# Patient Record
Sex: Male | Born: 1950 | Race: White | Hispanic: No | Smoking: Former smoker
Health system: Southern US, Community
[De-identification: ages and names within clinical notes are randomized; demographics above are authoritative.]

## PROBLEM LIST (undated history)

## (undated) DIAGNOSIS — I480 Paroxysmal atrial fibrillation: Secondary | ICD-10-CM

## (undated) DIAGNOSIS — R739 Hyperglycemia, unspecified: Secondary | ICD-10-CM

## (undated) DIAGNOSIS — I350 Nonrheumatic aortic (valve) stenosis: Secondary | ICD-10-CM

## (undated) DIAGNOSIS — I2510 Atherosclerotic heart disease of native coronary artery without angina pectoris: Secondary | ICD-10-CM

## (undated) DIAGNOSIS — D696 Thrombocytopenia, unspecified: Secondary | ICD-10-CM

## (undated) DIAGNOSIS — I10 Essential (primary) hypertension: Secondary | ICD-10-CM

## (undated) DIAGNOSIS — F1020 Alcohol dependence, uncomplicated: Secondary | ICD-10-CM

## (undated) DIAGNOSIS — I499 Cardiac arrhythmia, unspecified: Secondary | ICD-10-CM

## (undated) DIAGNOSIS — B9620 Unspecified Escherichia coli [E. coli] as the cause of diseases classified elsewhere: Secondary | ICD-10-CM

## (undated) DIAGNOSIS — E119 Type 2 diabetes mellitus without complications: Secondary | ICD-10-CM

## (undated) DIAGNOSIS — D62 Acute posthemorrhagic anemia: Secondary | ICD-10-CM

## (undated) DIAGNOSIS — K7010 Alcoholic hepatitis without ascites: Secondary | ICD-10-CM

## (undated) DIAGNOSIS — E039 Hypothyroidism, unspecified: Secondary | ICD-10-CM

## (undated) DIAGNOSIS — K921 Melena: Secondary | ICD-10-CM

## (undated) DIAGNOSIS — K219 Gastro-esophageal reflux disease without esophagitis: Secondary | ICD-10-CM

## (undated) DIAGNOSIS — M109 Gout, unspecified: Secondary | ICD-10-CM

## (undated) DIAGNOSIS — F1011 Alcohol abuse, in remission: Secondary | ICD-10-CM

## (undated) DIAGNOSIS — N390 Urinary tract infection, site not specified: Secondary | ICD-10-CM

## (undated) DIAGNOSIS — E876 Hypokalemia: Secondary | ICD-10-CM

## (undated) DIAGNOSIS — N184 Chronic kidney disease, stage 4 (severe): Secondary | ICD-10-CM

---
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
## 2016-08-22 ENCOUNTER — Encounter (HOSPITAL_COMMUNITY): Payer: Self-pay | Admitting: Emergency Medicine

## 2016-08-22 ENCOUNTER — Inpatient Hospital Stay (HOSPITAL_COMMUNITY)
Admission: EM | Admit: 2016-08-22 | Discharge: 2016-08-28 | DRG: 378 | Disposition: A | Discharge status: Home / Self Care | Payer: Medicare Other | Attending: Internal Medicine | Admitting: Internal Medicine

## 2016-08-22 DIAGNOSIS — K8020 Calculus of gallbladder without cholecystitis without obstruction: Secondary | ICD-10-CM | POA: Diagnosis present

## 2016-08-22 DIAGNOSIS — Z66 Do not resuscitate: Secondary | ICD-10-CM | POA: Diagnosis present

## 2016-08-22 DIAGNOSIS — D62 Acute posthemorrhagic anemia: Secondary | ICD-10-CM | POA: Diagnosis present

## 2016-08-22 DIAGNOSIS — Z9119 Patient's noncompliance with other medical treatment and regimen: Secondary | ICD-10-CM

## 2016-08-22 DIAGNOSIS — K5730 Diverticulosis of large intestine without perforation or abscess without bleeding: Secondary | ICD-10-CM | POA: Diagnosis present

## 2016-08-22 DIAGNOSIS — K7011 Alcoholic hepatitis with ascites: Secondary | ICD-10-CM | POA: Diagnosis present

## 2016-08-22 DIAGNOSIS — K7031 Alcoholic cirrhosis of liver with ascites: Secondary | ICD-10-CM

## 2016-08-22 DIAGNOSIS — E876 Hypokalemia: Secondary | ICD-10-CM | POA: Diagnosis present

## 2016-08-22 DIAGNOSIS — R627 Adult failure to thrive: Secondary | ICD-10-CM | POA: Diagnosis present

## 2016-08-22 DIAGNOSIS — Z9114 Patient's other noncompliance with medication regimen: Secondary | ICD-10-CM | POA: Diagnosis not present

## 2016-08-22 DIAGNOSIS — I10 Essential (primary) hypertension: Secondary | ICD-10-CM | POA: Diagnosis present

## 2016-08-22 DIAGNOSIS — D696 Thrombocytopenia, unspecified: Secondary | ICD-10-CM | POA: Diagnosis present

## 2016-08-22 DIAGNOSIS — D539 Nutritional anemia, unspecified: Secondary | ICD-10-CM | POA: Diagnosis present

## 2016-08-22 DIAGNOSIS — K219 Gastro-esophageal reflux disease without esophagitis: Secondary | ICD-10-CM | POA: Diagnosis present

## 2016-08-22 DIAGNOSIS — R7989 Other specified abnormal findings of blood chemistry: Secondary | ICD-10-CM | POA: Diagnosis not present

## 2016-08-22 DIAGNOSIS — K709 Alcoholic liver disease, unspecified: Secondary | ICD-10-CM | POA: Diagnosis present

## 2016-08-22 DIAGNOSIS — F1020 Alcohol dependence, uncomplicated: Secondary | ICD-10-CM | POA: Diagnosis present

## 2016-08-22 DIAGNOSIS — E039 Hypothyroidism, unspecified: Secondary | ICD-10-CM | POA: Diagnosis present

## 2016-08-22 DIAGNOSIS — Z79899 Other long term (current) drug therapy: Secondary | ICD-10-CM | POA: Diagnosis not present

## 2016-08-22 DIAGNOSIS — K640 First degree hemorrhoids: Secondary | ICD-10-CM | POA: Diagnosis present

## 2016-08-22 DIAGNOSIS — K921 Melena: Secondary | ICD-10-CM | POA: Diagnosis present

## 2016-08-22 DIAGNOSIS — R739 Hyperglycemia, unspecified: Secondary | ICD-10-CM | POA: Diagnosis present

## 2016-08-22 DIAGNOSIS — IMO0001 Reserved for inherently not codable concepts without codable children: Secondary | ICD-10-CM

## 2016-08-22 DIAGNOSIS — K625 Hemorrhage of anus and rectum: Secondary | ICD-10-CM | POA: Diagnosis present

## 2016-08-22 DIAGNOSIS — D649 Anemia, unspecified: Secondary | ICD-10-CM

## 2016-08-22 DIAGNOSIS — E038 Other specified hypothyroidism: Secondary | ICD-10-CM

## 2016-08-22 DIAGNOSIS — K7010 Alcoholic hepatitis without ascites: Secondary | ICD-10-CM

## 2016-08-22 HISTORY — DX: Essential (primary) hypertension: I10

## 2016-08-22 LAB — COMPREHENSIVE METABOLIC PANEL
ALK PHOS: 109 U/L (ref 38–126)
ALT: 42 U/L (ref 17–63)
AST: 179 U/L — ABNORMAL HIGH (ref 15–41)
Albumin: 3.8 g/dL (ref 3.5–5.0)
Anion gap: 16 — ABNORMAL HIGH (ref 5–15)
BILIRUBIN TOTAL: 2.2 mg/dL — AB (ref 0.3–1.2)
BUN: 16 mg/dL (ref 6–20)
CALCIUM: 9 mg/dL (ref 8.9–10.3)
CO2: 29 mmol/L (ref 22–32)
CREATININE: 1.14 mg/dL (ref 0.61–1.24)
Chloride: 90 mmol/L — ABNORMAL LOW (ref 101–111)
Glucose, Bld: 142 mg/dL — ABNORMAL HIGH (ref 65–99)
Potassium: 2.7 mmol/L — CL (ref 3.5–5.1)
Sodium: 135 mmol/L (ref 135–145)
TOTAL PROTEIN: 7.7 g/dL (ref 6.5–8.1)

## 2016-08-22 LAB — FERRITIN: FERRITIN: 553 ng/mL — AB (ref 24–336)

## 2016-08-22 LAB — PROTIME-INR
INR: 1.18
PROTHROMBIN TIME: 15 s (ref 11.4–15.2)

## 2016-08-22 LAB — GLUCOSE, CAPILLARY
GLUCOSE-CAPILLARY: 127 mg/dL — AB (ref 65–99)
Glucose-Capillary: 133 mg/dL — ABNORMAL HIGH (ref 65–99)

## 2016-08-22 LAB — URINALYSIS, ROUTINE W REFLEX MICROSCOPIC
GLUCOSE, UA: NEGATIVE mg/dL
KETONES UR: 15 mg/dL — AB
LEUKOCYTES UA: NEGATIVE
Nitrite: POSITIVE — AB
PH: 6 (ref 5.0–8.0)
Protein, ur: 100 mg/dL — AB
Specific Gravity, Urine: 1.02 (ref 1.005–1.030)

## 2016-08-22 LAB — FOLATE: Folate: 3.5 ng/mL — ABNORMAL LOW (ref >5.9)

## 2016-08-22 LAB — CBC
HCT: 24.5 % — ABNORMAL LOW (ref 39.0–52.0)
Hemoglobin: 8.8 g/dL — ABNORMAL LOW (ref 13.0–17.0)
MCH: 39.6 pg — AB (ref 26.0–34.0)
MCHC: 35.9 g/dL (ref 30.0–36.0)
MCV: 110.4 fL — ABNORMAL HIGH (ref 78.0–100.0)
PLATELETS: 63 10*3/uL — AB (ref 150–400)
RBC: 2.22 MIL/uL — AB (ref 4.22–5.81)
RDW: 15.3 % (ref 11.5–15.5)
WBC: 5.1 10*3/uL (ref 4.0–10.5)

## 2016-08-22 LAB — IRON AND TIBC
IRON: 189 ug/dL — AB (ref 45–182)
Saturation Ratios: 75 % — ABNORMAL HIGH (ref 17.9–39.5)
TIBC: 253 ug/dL (ref 250–450)
UIBC: 64 ug/dL

## 2016-08-22 LAB — TYPE AND SCREEN
ABO/RH(D): O POS
Antibody Screen: NEGATIVE

## 2016-08-22 LAB — VITAMIN B12: Vitamin B-12: 196 pg/mL (ref 180–914)

## 2016-08-22 LAB — URINE MICROSCOPIC-ADD ON: BACTERIA UA: NONE SEEN

## 2016-08-22 LAB — POC OCCULT BLOOD, ED: FECAL OCCULT BLD: POSITIVE — AB

## 2016-08-22 LAB — ABO/RH: ABO/RH(D): O POS

## 2016-08-22 MED ORDER — LEVOTHYROXINE SODIUM 100 MCG PO TABS
200.0000 ug | ORAL_TABLET | Freq: Every day | ORAL | Status: DC
  Administered 2016-08-23 – 2016-08-25 (×3): 200 ug via ORAL
  Filled 2016-08-22 (×3): qty 2

## 2016-08-22 MED ORDER — SODIUM CHLORIDE 0.9 % IV SOLN
510.0000 mg | Freq: Once | INTRAVENOUS | Status: AC
  Administered 2016-08-22: 510 mg via INTRAVENOUS
  Filled 2016-08-22 (×2): qty 17

## 2016-08-22 MED ORDER — POTASSIUM CHLORIDE CRYS ER 20 MEQ PO TBCR
40.0000 meq | EXTENDED_RELEASE_TABLET | Freq: Two times a day (BID) | ORAL | Status: DC
  Administered 2016-08-22: 40 meq via ORAL
  Filled 2016-08-22: qty 2

## 2016-08-22 MED ORDER — PANTOPRAZOLE SODIUM 40 MG PO TBEC
40.0000 mg | DELAYED_RELEASE_TABLET | Freq: Two times a day (BID) | ORAL | Status: DC
  Administered 2016-08-22 – 2016-08-28 (×12): 40 mg via ORAL
  Filled 2016-08-22 (×12): qty 1

## 2016-08-22 MED ORDER — LORAZEPAM 2 MG/ML IJ SOLN
0.0000 mg | Freq: Two times a day (BID) | INTRAMUSCULAR | Status: AC
Start: 2016-08-24 — End: 2016-08-26
  Filled 2016-08-22: qty 1

## 2016-08-22 MED ORDER — LORAZEPAM 1 MG PO TABS
1.0000 mg | ORAL_TABLET | Freq: Four times a day (QID) | ORAL | Status: AC | PRN
  Administered 2016-08-24: 1 mg via ORAL
  Filled 2016-08-22: qty 1

## 2016-08-22 MED ORDER — THIAMINE HCL 100 MG/ML IJ SOLN: 100.0000 mg | Freq: Every day | INTRAMUSCULAR | Status: DC

## 2016-08-22 MED ORDER — LORAZEPAM 2 MG/ML IJ SOLN
1.0000 mg | Freq: Four times a day (QID) | INTRAMUSCULAR | Status: AC | PRN
  Administered 2016-08-24: 1 mg via INTRAVENOUS

## 2016-08-22 MED ORDER — ADULT MULTIVITAMIN W/MINERALS CH
1.0000 | ORAL_TABLET | Freq: Every day | ORAL | Status: DC
  Administered 2016-08-22 – 2016-08-28 (×6): 1 via ORAL
  Filled 2016-08-22 (×6): qty 1

## 2016-08-22 MED ORDER — ONDANSETRON HCL 4 MG/2ML IJ SOLN: 4.0000 mg | Freq: Four times a day (QID) | INTRAMUSCULAR | Status: DC | PRN

## 2016-08-22 MED ORDER — LORAZEPAM 2 MG/ML IJ SOLN
0.0000 mg | Freq: Four times a day (QID) | INTRAMUSCULAR | Status: AC
Start: 2016-08-22 — End: 2016-08-24
  Administered 2016-08-24: 2 mg via INTRAVENOUS
  Filled 2016-08-22: qty 1

## 2016-08-22 MED ORDER — SODIUM CHLORIDE 0.9 % IV SOLN
30.0000 meq | Freq: Once | INTRAVENOUS | Status: AC
  Administered 2016-08-22: 30 meq via INTRAVENOUS
  Filled 2016-08-22: qty 15

## 2016-08-22 MED ORDER — VITAMIN B-1 100 MG PO TABS
100.0000 mg | ORAL_TABLET | Freq: Every day | ORAL | Status: DC
  Administered 2016-08-22 – 2016-08-28 (×7): 100 mg via ORAL
  Filled 2016-08-22 (×7): qty 1

## 2016-08-22 MED ORDER — AMLODIPINE BESYLATE 10 MG PO TABS
10.0000 mg | ORAL_TABLET | Freq: Every day | ORAL | Status: DC
  Administered 2016-08-22: 10 mg via ORAL
  Filled 2016-08-22: qty 1

## 2016-08-22 MED ORDER — ACETAMINOPHEN 650 MG RE SUPP: 650.0000 mg | Freq: Four times a day (QID) | RECTAL | Status: DC | PRN

## 2016-08-22 MED ORDER — FOLIC ACID 1 MG PO TABS
1.0000 mg | ORAL_TABLET | Freq: Every day | ORAL | Status: DC
  Administered 2016-08-22 – 2016-08-28 (×7): 1 mg via ORAL
  Filled 2016-08-22 (×7): qty 1

## 2016-08-22 MED ORDER — ONDANSETRON HCL 4 MG PO TABS: 4.0000 mg | ORAL_TABLET | Freq: Four times a day (QID) | ORAL | Status: DC | PRN

## 2016-08-22 MED ORDER — ACETAMINOPHEN 325 MG PO TABS: 650.0000 mg | ORAL_TABLET | Freq: Four times a day (QID) | ORAL | Status: DC | PRN

## 2016-08-22 NOTE — ED Provider Notes (Signed)
[facility]-EMERGENCY DEPT Provider Note   CSN: [id number] Arrival date & time: 08/22/16  1116     History   Chief Complaint Chief Complaint  Patient presents with  . Rectal Bleeding  . Abnormal Lab    HPI Mark Meadows is a 65 y.o. male.  65 year old Caucasian male past medical history significant for hypertension, thyroid disease a presents to the ED today after a referral from his primary care doctor for GI bleed and low hemoglobin. Patient states "my doctor said she did blood work and told her to come here I could die". Patient endorses dark blood in his stool that started over the past 1-2 months. He also endorses generalized weakness for the past 1-2 months. Patient states his last colonoscopy was 30 years ago. He denies any prolonged NSAID use. Denies any history of diverticulitis, PUD, H pylori infection. Patient states that he drinks approximately 2 1.5 L of liquor per week. He denies any fever, chills, headache vision changes, lightheadedness, dizziness, cough, chest pain, shortness of breath, abdominal pain, nausea, emesis, urinary symptoms, numbness/tingling. Patient states his last bowel movement was yesterday with dark blood.      Past Medical History:  Diagnosis Date  . Hypertension   . Thyroid disease     There are no active problems to display for this patient.   History reviewed. No pertinent surgical history.     Home Medications    Prior to Admission medications   Not on File    Family History No family history on file.  Social History Social History  Substance Use Topics  . Smoking status: Never Smoker  . Smokeless tobacco: Not on file  . Alcohol use Yes     Allergies   Patient has no known allergies.   Review of Systems Review of Systems  Constitutional: Negative for chills and fever.  HENT: Negative for congestion, ear pain, rhinorrhea and sore throat.   Eyes: Negative for pain and discharge.  Respiratory: Negative for cough and  shortness of breath.   Cardiovascular: Negative for chest pain and palpitations.  Gastrointestinal: Negative for abdominal pain, diarrhea, nausea and vomiting.  Genitourinary: Negative for flank pain, frequency, hematuria and urgency.  Musculoskeletal: Negative for myalgias and neck pain.  Neurological: Positive for weakness. Negative for dizziness, syncope, light-headedness, numbness and headaches.  All other systems reviewed and are negative.    Physical Exam Updated Vital Signs BP 131/95   Pulse 73   Temp 98.1 F (36.7 C) (Oral)   Resp 18   SpO2 100%   Physical Exam  Constitutional: He is oriented to person, place, and time. He appears well-developed and well-nourished. No distress.  Appears older than stated age.  HENT:  Head: Normocephalic and atraumatic.  Mouth/Throat: Uvula is midline, oropharynx is clear and moist and mucous membranes are normal.  Eyes: Conjunctivae and EOM are normal. Pupils are equal, round, and reactive to light. Right eye exhibits no discharge. Left eye exhibits no discharge. No scleral icterus.  Neck: Normal range of motion. Neck supple. No thyromegaly present.  Cardiovascular: Normal rate, regular rhythm, normal heart sounds and intact distal pulses.  Exam reveals no gallop and no friction rub.   No murmur heard. Pulmonary/Chest: Effort normal and breath sounds normal. No respiratory distress.  CTAB   Abdominal: Soft. Bowel sounds are normal. He exhibits no distension. There is no tenderness. There is no rebound and no guarding.  Genitourinary:  Genitourinary Comments: Chaperon present for exam with soft brown stool in rectal vault  with small amount of red blood. External and internal hemorrhoids noted. No tenderness to palpation. Rectal tone is normal.  Musculoskeletal: Normal range of motion.  1+ pitting edema up to the level of the shins.  Lymphadenopathy:    He has no cervical adenopathy.  Neurological: He is alert and oriented to person,  place, and time.  Skin: Skin is warm and dry. Capillary refill takes less than 2 seconds. No pallor.  Nursing note and vitals reviewed.    ED Treatments / Results  Labs (all labs ordered are listed, but only abnormal results are displayed) Labs Reviewed  COMPREHENSIVE METABOLIC PANEL - Abnormal; Notable for the following:       Result Value   Potassium 2.7 (*)    Chloride 90 (*)    Glucose, Bld 142 (*)    AST 179 (*)    Total Bilirubin 2.2 (*)    Anion gap 16 (*)    All other components within normal limits  CBC - Abnormal; Notable for the following:    RBC 2.22 (*)    Hemoglobin 8.8 (*)    HCT 24.5 (*)    MCV 110.4 (*)    MCH 39.6 (*)    Platelets 63 (*)    All other components within normal limits  POC OCCULT BLOOD, ED - Abnormal; Notable for the following:    Fecal Occult Bld POSITIVE (*)    All other components within normal limits  URINALYSIS, ROUTINE W REFLEX MICROSCOPIC (NOT AT [facility])  PROTIME-INR  HEMOGLOBIN A1C  TYPE AND SCREEN  ABO/RH    EKG  EKG Interpretation None       Radiology No results found.  Procedures Procedures (including critical care time)  Medications Ordered in ED Medications  potassium chloride 30 mEq in sodium chloride 0.9 % 265 mL (KCL MULTIRUN) IVPB (30 mEq Intravenous Given 08/22/16 1406)     Initial Impression / Assessment and Plan / ED Course  I have reviewed the triage vital signs and the nursing notes.  Pertinent labs & imaging results that were available during my care of the patient were reviewed by me and considered in my medical decision making (see chart for details).  Clinical Course   Patient presents to the ED after referral from PCP for anemia, elevated liver enzymes, hypothyroidism, rectal bleeding, and generalized weakness. The patient's hemoglobin was noted to be 8.8. Patient is not tachycardic or hypotensive at this time. He denies any shortness of breath. Will not transfuse at this time. Type and screen was  ordered. Patient potassium was also noted to be 2.7. Started on potassium supplement IV per pharm consult. Patient was Hemoccult positive. Denies any chest pain, shortness breath, abdominal pain. Patient's liver enzymes were also elevated. History of alcohol abuse. TSH was drawn yesterday at primary care doctor noted to be 90. Patient is not currently on any hypothyroidism medication. Consult GI medicine and they will see patient once admitted to the hospital service. Spoke with Dr. Adrian BlackwaterStinson with hospital medicine who agrees to admit patient. Patient is currently hemodynamically stable his time. He is in no acute distress. Patient has been seen and evaluated by Dr. Rubin PayorPickering who agrees with the above plan.  Final Clinical Impressions(s) / ED Diagnoses   Final diagnoses:  Rectal bleeding  Hypokalemia  Anemia, unspecified type  Hypothyroidism, unspecified type    New Prescriptions New Prescriptions   No medications on file     Rise MuKenneth T Damire Remedios, PA-C 08/22/16 1528    Benjiman CoreNathan Pickering,  MD 08/22/16 1610

## 2016-08-22 NOTE — Consult Note (Signed)
Referring Provider: EDP, Dr. Cruzita LedererLeaphart Primary Care Physician:  No PCP Per Patient Primary Gastroenterologist:  Gentry FitzUnassigned  Reason for Consultation:  GI bleed  HPI: Mark Meadows is a 65 y.o. male with a history of HTN, hypothyroidism, alcoholism. Patient has been having worsening weakness over the past 4 months. He has been non-compliant with his medications and has not taken his blood pressure medication or his thyroid medication for the past 8 months. He also admits to being chronic alcoholic, drinking 3 large bottles of hard liquor a week since he was 17 (recently cut back to 2 a week).  He reports difficulty walking and needing to hold onto objects while walking. Saw PCP and hold that several labs were abnormal and that he appeared to be bleeding somewhere so needed to come to the hospital.  While he was here he reported rectal bleeding.  Was found to be heme positive with labs as follows:  Emergency Department Course: Workup emergency department shows potassium 2.7 AST of 179 with a normal ALT and elevated bilirubin of 2.2. His hemoglobin is 8.8 grams with no comparison. Platelets are 63. From his blood work at his PCPs yesterday, his TSH was 90.  He tells me that he's had intermittent bleeding like this for some months.  Describes stool as being brown but blood is dark red/maroon in color.  No black stools.  Had a colonoscopy 30 or so years ago.  Denies nausea, vomiting, abdominal pain.  Denies NSAID use.  Admits to daily reflux for which he takes TUMs a few times per day.    Past Medical History:  Diagnosis Date  . Hypertension   . Thyroid disease     History reviewed. No pertinent surgical history.  Prior to Admission medications   Medication Sig Start Date End Date Taking? Authorizing Provider  amLODipine (NORVASC) 10 MG tablet Take 1 tablet by mouth daily. 08/09/14   Historical Provider, MD  hydrochlorothiazide (HYDRODIURIL) 25 MG tablet Take 1 tablet by mouth daily. 04/18/15    Historical Provider, MD  indomethacin (INDOCIN SR) 75 MG CR capsule Take 75 mg by mouth.    Historical Provider, MD  levothyroxine (SYNTHROID, LEVOTHROID) 200 MCG tablet Take 1 tablet by mouth daily. 05/03/15   Historical Provider, MD  metoprolol (LOPRESSOR) 50 MG tablet Take 1 tablet by mouth 2 (two) times daily. 09/10/15   Historical Provider, MD    Current Facility-Administered Medications  Medication Dose Route Frequency Provider Last Rate Last Dose  . potassium chloride 30 mEq in sodium chloride 0.9 % 265 mL (KCL MULTIRUN) IVPB  30 mEq Intravenous Once Rise MuKenneth T Leaphart, PA-C   30 mEq at 08/22/16 1406   Current Outpatient Prescriptions  Medication Sig Dispense Refill  . amLODipine (NORVASC) 10 MG tablet Take 1 tablet by mouth daily.    . hydrochlorothiazide (HYDRODIURIL) 25 MG tablet Take 1 tablet by mouth daily.    . indomethacin (INDOCIN SR) 75 MG CR capsule Take 75 mg by mouth.    . levothyroxine (SYNTHROID, LEVOTHROID) 200 MCG tablet Take 1 tablet by mouth daily.    . metoprolol (LOPRESSOR) 50 MG tablet Take 1 tablet by mouth 2 (two) times daily.      Allergies as of 08/22/2016  . (No Known Allergies)    No family history on file.  Social History   Social History  . Marital status: Widowed    Spouse name: N/A  . Number of children: N/A  . Years of education: N/A   Occupational  History  . Not on file.   Social History Main Topics  . Smoking status: Never Smoker  . Smokeless tobacco: Not on file  . Alcohol use Yes  . Drug use: Unknown  . Sexual activity: Not on file   Other Topics Concern  . Not on file   Social History Narrative  . No narrative on file    Review of Systems: ROS is O/W negative except as mentioned in HPI.  Physical Exam: Vital signs in last 24 hours: Temp:  [98.1 F (36.7 C)] 98.1 F (36.7 C) (12/01 1142) Pulse Rate:  [65-79] 66 (12/01 1400) Resp:  [8-18] 16 (12/01 1400) BP: (127-144)/(89-104) 127/89 (12/01 1400) SpO2:  [98 %-100  %] 99 % (12/01 1400)   General:  Alert, appears older than stated age, pleasant and cooperative in NAD.  Somewhat disheveled and unkept. Head:  Normocephalic and atraumatic. Eyes:  Sclera clear, no icterus.  Conjunctiva pink. Ears:  Normal auditory acuity. Mouth:  No deformity or lesions.   Lungs:  Clear throughout to auscultation.  No wheezes, crackles, or rhonchi.  Heart:  Regular rate and rhythm; no murmurs, clicks, rubs, or gallops. Abdomen:  Somewhat hard/distended.  Seems to have ascites.  BS present.  Non-tender. Rectal:  No external abnormalities.  DRE revealed small amount of light brown stool with traces of light colored blood. Msk:  Symmetrical without gross deformities. Pulses:  Normal pulses noted. Extremities:  Without clubbing or edema. Neurologic:  Alert and oriented x 4;  grossly normal neurologically. Skin:  Intact without significant lesions or rashes. Psych:  Alert and cooperative. Normal mood and affect.  Lab Results:  Recent Labs  08/22/16 1144  WBC 5.1  HGB 8.8*  HCT 24.5*  PLT 63*   BMET  Recent Labs  08/22/16 1144  NA 135  K 2.7*  CL 90*  CO2 29  GLUCOSE 142*  BUN 16  CREATININE 1.14  CALCIUM 9.0   LFT  Recent Labs  08/22/16 1144  PROT 7.7  ALBUMIN 3.8  AST 179*  ALT 42  ALKPHOS 109  BILITOT 2.2*   IMPRESSION:  -GI bleed:  Appears to be lower according to my exam today.  Has been intermittent for months.  Could be from internal hemorrhoids, colonic malignancy, but with his ETOH use and suspected cirrhosis then need to rule out upper source as well. -Macrocytic anemia:  Hgb 8.8 grams with no other labs for comparison.   -ETOH hepatitis:  AST 179 and ALT 42.  Total bili 2.2.  Suspect cirrhosis.  He appears to have ascites by exam. -Thrombocytopenia:  Suspect cirrhosis. -ETOH abuse:  Monitor for DT's.  CIWA protocol. -Hypokalemia:  Being corrected by primary service.  PLAN: -Will place him on BID pantoprazole 40 mg. -K+ needs  corrected. -Needs to be monitored for DT's. -Follow-up ultrasound. -Monitor Hgb and transfuse prn. -Will check iron studies, B12, and folate. -Will need EGD and colonoscopy during hospitalization.  Timing to be decided by GI team tomorrow.   ZEHR, JESSICA D.  08/22/2016, 3:10 PM Pager number [phone]  GI ATTENDING  History, laboratories, x-rays reviewed. Agree with comprehensive consultation note as outlined above. Noncompliant alcoholic who presents with malaise and minor intermittent rectal bleeding. He is found to be hypokalemic, thrombocytopenia, anemic, and hypothyroid. Liver test abnormalities consistent with alcoholic liver disease. Agree with admission, hydration, correction of electrolytes and vitamin deficiencies, correction of hypothyroidism, and monitoring for DTs. He will need colonoscopy to evaluate rectal bleeding and upper endoscopy to rule  out varices. However, he needs "tuned up" medically first. Ultrasound pending.  Mark Meadows, Jr., M.D. [facility] Division of Gastroenterology

## 2016-08-22 NOTE — ED Notes (Signed)
Pt given chicken broth per Kirt BoysMolly, RN

## 2016-08-22 NOTE — ED Notes (Signed)
Report called  

## 2016-08-22 NOTE — H&P (Signed)
History and Physical  Mark Meadows QIO:[id number] DOB: [birth year] DOA: 08/22/2016  Referring physician: Azucena Kubayler Leaphart, Cordelia PochePA-C, ED physician PCP: Dr Elizabeth Palaueresa Anderson Outpatient Specialists: none  Chief Complaint: Weakness, rectal bleeding  HPI: Mark BaliJeffrey Meadows is a 65 y.o. male with a history of HTN, hypothyroidism, alcoholism. Patient has been having worsening weakness over the past 4 months. He has been noncompliant with his medications has not taken his blood pressure medication orders thyroid medication for the past 8 months. He also admits to being chronic alcoholic retaining 2-3 points of hard liquor a week. No palliating or provoking factors to his weakness. With past couple months, he has gone about 48 hours without alcohol and has had "DTs", although in question the patient his symptoms are tremors. He reports difficulty walking and needing to hold onto objects while walking. He denies shortness of breath, cough, weakness. He also admits to having bloody stools and reports passing frank blood from rectum. No melena.  Emergency Department Course: Workup emergency department shows potassium 2.7 AST of 179 with a normal ALT and elevated bilirubin of 2.2. His hemoglobin is 8.8. Platelets are 63. From his blood work at his PCPs yesterday, his TSH was 90.  Review of Systems:   Pt denies any fevers, chills, nausea, vomiting, diarrhea, constipation, abdominal pain, shortness of breath, dyspnea on exertion, orthopnea, cough, wheezing, palpitations, headache, vision changes, lightheadedness, dizziness, melena.  Review of systems are otherwise negative  Past Medical History:  Diagnosis Date  . Hypertension   . Thyroid disease    History reviewed. No pertinent surgical history. Social History:  reports that he has never smoked. He does not have any smokeless tobacco history on file. He reports that he drinks alcohol. His drug history is not on file. Patient lives at Home  No Known  Allergies  Family history Unknown by patient  Prior to Admission medications   Medication Sig Start Date End Date Taking? Authorizing Provider  amLODipine (NORVASC) 10 MG tablet Take 1 tablet by mouth daily. 08/09/14   Historical Provider, MD  hydrochlorothiazide (HYDRODIURIL) 25 MG tablet Take 1 tablet by mouth daily. 04/18/15   Historical Provider, MD  indomethacin (INDOCIN SR) 75 MG CR capsule Take 75 mg by mouth.    Historical Provider, MD  levothyroxine (SYNTHROID, LEVOTHROID) 200 MCG tablet Take 1 tablet by mouth daily. 05/03/15   Historical Provider, MD  metoprolol (LOPRESSOR) 50 MG tablet Take 1 tablet by mouth 2 (two) times daily. 09/10/15   Historical Provider, MD    Physical Exam: BP 127/89   Pulse 66   Temp 98.1 F (36.7 C) (Oral)   Resp 16   SpO2 99%   General: Elderly Caucasian male who appears older than stated age.. Awake and alert and oriented x3. No acute cardiopulmonary distress.  HEENT: Normocephalic atraumatic.  Right and left ears normal in appearance.  Pupils equal, round, reactive to light. Extraocular muscles are intact. Sclerae anicteric and noninjected.  Moist mucosal membranes. No mucosal lesions.  Neck: Neck supple without lymphadenopathy. No carotid bruits. No masses palpated.  Cardiovascular: Regular rate with normal S1-S2 sounds. No murmurs, rubs, gallops auscultated. No JVD. Trace edema Respiratory: Good respiratory effort with no wheezes, rales, rhonchi. Lungs clear to auscultation bilaterally.  No accessory muscle use. Abdomen: Obese. Soft, nontender, nondistended. Active bowel sounds. Body habitus makes palpation of his liver margin very difficult. Skin: No rashes, lesions, or ulcerations.  Dry, warm to touch. 2+ dorsalis pedis and radial pulses.  Musculoskeletal: No calf or leg pain.  All major joints not erythematous nontender.  No upper or lower joint deformation.  Good ROM.  No contractures  Psychiatric: Intact judgment and insight. Pleasant and  cooperative. Neurologic: No focal neurological deficits. Strength is 5/5 and symmetric in upper and lower extremities.  Cranial nerves II through XII are grossly intact.           Labs on Admission: I have personally reviewed following labs and imaging studies  CBC:  Recent Labs Lab 08/22/16 1144  WBC 5.1  HGB 8.8*  HCT 24.5*  MCV 110.4*  PLT 63*   Basic Metabolic Panel:  Recent Labs Lab 08/22/16 1144  NA 135  K 2.7*  CL 90*  CO2 29  GLUCOSE 142*  BUN 16  CREATININE 1.14  CALCIUM 9.0   GFR: CrCl cannot be calculated (Unknown ideal weight.). Liver Function Tests:  Recent Labs Lab 08/22/16 1144  AST 179*  ALT 42  ALKPHOS 109  BILITOT 2.2*  PROT 7.7  ALBUMIN 3.8   No results for input(s): LIPASE, AMYLASE in the last 168 hours. No results for input(s): AMMONIA in the last 168 hours. Coagulation Profile: No results for input(s): INR, PROTIME in the last 168 hours. Cardiac Enzymes: No results for input(s): CKTOTAL, CKMB, CKMBINDEX, TROPONINI in the last 168 hours. BNP (last 3 results) No results for input(s): PROBNP in the last 8760 hours. HbA1C: No results for input(s): HGBA1C in the last 72 hours. CBG: No results for input(s): GLUCAP in the last 168 hours. Lipid Profile: No results for input(s): CHOL, HDL, LDLCALC, TRIG, CHOLHDL, LDLDIRECT in the last 72 hours. Thyroid Function Tests: No results for input(s): TSH, T4TOTAL, FREET4, T3FREE, THYROIDAB in the last 72 hours. Anemia Panel: No results for input(s): VITAMINB12, FOLATE, FERRITIN, TIBC, IRON, RETICCTPCT in the last 72 hours. Urine analysis: No results found for: COLORURINE, APPEARANCEUR, LABSPEC, PHURINE, GLUCOSEU, HGBUR, BILIRUBINUR, KETONESUR, PROTEINUR, UROBILINOGEN, NITRITE, LEUKOCYTESUR Sepsis Labs: @LABRCNTIP (procalcitonin:4,lacticidven:4) )No results found for this or any previous visit (from the past 240 hour(s)).   Radiological Exams on Admission: No results  found.  Assessment/Plan: Principal Problem:   Rectal bleeding Active Problems:   Hypothyroidism   Hypertension   Alcoholism /alcohol abuse (HCC)   Alcoholic hepatitis   Hypokalemia   Hyperglycemia   Thrombocytopenia (HCC)   Acute blood loss anemia    This patient was discussed with the ED physician, including pertinent vitals, physical exam findings, labs, and imaging.  We also discussed care given by the ED provider.  #1 rectal bleeding  Admit  Consult GI  Patient type and screened  We'll hold on clear liquids as patient may need colonoscopy #2 alcoholism  Alcohol withdrawal protocol  Thiamine  Folic acid #3 hypothyroidism  Restart thyroid medication at 200 g per day - will give first dose now and then tomorrow morning #4 hyperglycemia  Question of overt diabetes  CBGs before meals and daily at bedtime  Check hemoglobin A1c #5 hypertension  Restart amlodipine. Will hold metoprolol #6 hypokalemia  Potassium replacement  Recheck potassium in the morning #7 thrombocytopenia  Likely secondary to alcoholism. #8 alcoholic hepatitis  Check right upper quadrant ultrasound #9 anemia - acute blood loss  Recheck CBC in the morning  We'll give a dose of Feraheme to replace iron stores  Continue folic acid  DVT prophylaxis: SCDs Consultants: GI Code Status: DO NOT RESUSCITATE Family Communication: None  Disposition Plan: Pending   Levie HeritageJacob J Caci Orren, DO [facility] Pager [phone]  If 7PM-7AM, please contact night-coverage [email or web address] Password [email or web address]

## 2016-08-22 NOTE — ED Triage Notes (Signed)
Pt states "my doctor said she did blood work and told me to come here or I could die". Pt states "im bleeding from somewhere". Pt states hes been bleeding from his rectum, describes it as dark blood. Pt states hes seen it every day. Pt in NAD, denies pain.

## 2016-08-23 ENCOUNTER — Inpatient Hospital Stay (HOSPITAL_COMMUNITY): Payer: Medicare Other

## 2016-08-23 DIAGNOSIS — F1020 Alcohol dependence, uncomplicated: Secondary | ICD-10-CM

## 2016-08-23 DIAGNOSIS — R7989 Other specified abnormal findings of blood chemistry: Secondary | ICD-10-CM

## 2016-08-23 LAB — BASIC METABOLIC PANEL
ANION GAP: 12 (ref 5–15)
BUN: 11 mg/dL (ref 6–20)
CALCIUM: 8.7 mg/dL — AB (ref 8.9–10.3)
CHLORIDE: 95 mmol/L — AB (ref 101–111)
CO2: 30 mmol/L (ref 22–32)
Creatinine, Ser: 0.92 mg/dL (ref 0.61–1.24)
GLUCOSE: 96 mg/dL (ref 65–99)
POTASSIUM: 2.9 mmol/L — AB (ref 3.5–5.1)
Sodium: 137 mmol/L (ref 135–145)

## 2016-08-23 LAB — CBC
HCT: 23.6 % — ABNORMAL LOW (ref 39.0–52.0)
Hemoglobin: 8.5 g/dL — ABNORMAL LOW (ref 13.0–17.0)
MCH: 39.9 pg — ABNORMAL HIGH (ref 26.0–34.0)
MCHC: 36 g/dL (ref 30.0–36.0)
MCV: 110.8 fL — ABNORMAL HIGH (ref 78.0–100.0)
PLATELETS: 57 10*3/uL — AB (ref 150–400)
RBC: 2.13 MIL/uL — ABNORMAL LOW (ref 4.22–5.81)
RDW: 15.1 % (ref 11.5–15.5)
WBC: 4.9 10*3/uL (ref 4.0–10.5)

## 2016-08-23 LAB — GLUCOSE, CAPILLARY
GLUCOSE-CAPILLARY: 113 mg/dL — AB (ref 65–99)
Glucose-Capillary: 101 mg/dL — ABNORMAL HIGH (ref 65–99)
Glucose-Capillary: 123 mg/dL — ABNORMAL HIGH (ref 65–99)
Glucose-Capillary: 151 mg/dL — ABNORMAL HIGH (ref 65–99)

## 2016-08-23 LAB — HEMOGLOBIN A1C
Hgb A1c MFr Bld: 4.9 % (ref 4.8–5.6)
Mean Plasma Glucose: 94 mg/dL

## 2016-08-23 LAB — MAGNESIUM: Magnesium: 1.5 mg/dL — ABNORMAL LOW (ref 1.7–2.4)

## 2016-08-23 MED ORDER — METOPROLOL TARTRATE 12.5 MG HALF TABLET
12.5000 mg | ORAL_TABLET | Freq: Two times a day (BID) | ORAL | Status: DC
  Administered 2016-08-24 – 2016-08-28 (×7): 12.5 mg via ORAL
  Filled 2016-08-23 (×7): qty 1

## 2016-08-23 MED ORDER — POTASSIUM CHLORIDE CRYS ER 20 MEQ PO TBCR
40.0000 meq | EXTENDED_RELEASE_TABLET | Freq: Two times a day (BID) | ORAL | Status: AC
  Administered 2016-08-23 – 2016-08-24 (×4): 40 meq via ORAL
  Filled 2016-08-23 (×5): qty 2

## 2016-08-23 MED ORDER — MAGNESIUM SULFATE 2 GM/50ML IV SOLN
2.0000 g | Freq: Once | INTRAVENOUS | Status: AC
  Administered 2016-08-23: 2 g via INTRAVENOUS
  Filled 2016-08-23: qty 50

## 2016-08-23 MED ORDER — KCL IN DEXTROSE-NACL 20-5-0.9 MEQ/L-%-% IV SOLN
INTRAVENOUS | Status: DC
  Administered 2016-08-23 – 2016-08-24 (×2): via INTRAVENOUS
  Filled 2016-08-23 (×3): qty 1000

## 2016-08-23 NOTE — Progress Notes (Signed)
    Progress Note   Subjective  Feels weak, no rectal bleeding reported.    Objective  Vital signs in last 24 hours: Temp:  [98.1 F (36.7 C)-98.6 F (37 C)] 98.6 F (37 C) (12/02 0602) Pulse Rate:  [64-79] 70 (12/02 0602) Resp:  [8-22] 18 (12/02 0602) BP: (103-148)/(63-104) 103/63 (12/02 0602) SpO2:  [96 %-100 %] 96 % (12/02 0602) Weight:  [228 lb 2.8 oz (103.5 kg)] 228 lb 2.8 oz (103.5 kg) (12/01 1652) Last BM Date: 08/20/16  General: Alert, well-developed, in NAD Heart:  Regular rate and rhythm; no murmurs Chest: Clear to ascultation bilaterally Abdomen:  Soft, nontender and moderately distended. Normal bowel sounds, without guarding, and without rebound.   Extremities:  Without edema. Neurologic:  Alert and  oriented x4; grossly normal neurologically. Psych:  Alert and cooperative. Normal mood and affect.  Intake/Output from previous day: 12/01 0701 - 12/02 0700 In: 477 [P.O.:360; IV Piggyback:117] Out: 745 [Urine:745] Intake/Output this shift: No intake/output data recorded.  Lab Results:  Recent Labs  08/22/16 1144 08/23/16 0552  WBC 5.1 4.9  HGB 8.8* 8.5*  HCT 24.5* 23.6*  PLT 63* 57*   BMET  Recent Labs  08/22/16 1144 08/23/16 0552  NA 135 137  K 2.7* 2.9*  CL 90* 95*  CO2 29 30  GLUCOSE 142* 96  BUN 16 11  CREATININE 1.14 0.92  CALCIUM 9.0 8.7*   LFT  Recent Labs  08/22/16 1144  PROT 7.7  ALBUMIN 3.8  AST 179*  ALT 42  ALKPHOS 109  BILITOT 2.2*   PT/INR  Recent Labs  08/22/16 1525  LABPROT 15.0  INR 1.18       Assessment & Plan   1. Small volume hematochezia. Colonoscopy when K+ corrected and he is otherwise stable.   2. Macrocytic anemia, folate deficient. Replace folate. R/O hypersplenism causing thrombocytopenia and component of anemia. Trend CBC.    3. Alcoholism, suspected cirrhosis with ascites. Await abd US. Diagnostic paracentesis for cell counts, albumin, culture if ascites is noted. Observe for withdrawal. If  cirrhosis noted will plan EGD at time of colonoscopy to screen for varices.   4. Hypokalemia. Check Mg. Replace and trend K. Per primary service.   5. Hypothyroidism. Replacement restarted per primary service.   6. Hypertension. Medications restarted per primary service.   Principal Problem:   Rectal bleeding Active Problems:   Hypothyroidism   Hypertension   Alcoholism /alcohol abuse (HCC)   Alcoholic hepatitis   Hypokalemia   Hyperglycemia   Thrombocytopenia (HCC)   Acute blood loss anemia    LOS: 1 day   Admire Bunnell T. Russella DarStark MD 08/23/2016, 8:52 AM

## 2016-08-23 NOTE — ED Notes (Signed)
$  330 cash placed in secure envelope and given to security

## 2016-08-23 NOTE — Progress Notes (Addendum)
PROGRESS NOTE  Mark Meadows JGG:[id number] DOB: 1950-11-16 DOA: 08/22/2016 PCP: No PCP Per Patient  HPI/Recap of past 24 hours:  Denies pain, no n/v, no fever,  Assessment/Plan: Principal Problem:   Rectal bleeding Active Problems:   Hypothyroidism   Hypertension   Alcoholism /alcohol abuse (Mark Meadows)   Alcoholic hepatitis   Hypokalemia   Hyperglycemia   Thrombocytopenia (HCC)   Acute blood loss anemia  hematochezia. Vital stable, no abdominal pain, GI consulted, plant for Colonoscopy when K+ corrected and he is otherwise stable.   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  Hbg/plt 13.6/ 192 on 09/06/2015  lft elevation, ast>alt, likely from alcohol, normal alk phos, tbili 2.2, liver US pending, will check hepatitis panel and hiv screening  Alcoholism, on ciwa protocol.   Hypokalemia. Replace k, Check Mg. Hold home meds hctz  Hypothyroidism. Noncompliance with meds, recent tsh 92 on 11/30.  Start synthroid, need repeat tsh in 3-4 weeks.   Hypertension. Hold norvasc, hold hctz, start low dose lopressor with holding parameters   Noncompliance, education provided  FTT: patient report he has been feeling extremity fatigued with gait insteability and falls . FTT likely from untreated hypothyrodism and alcohol, treat underline disease, will need PT , home health vs snf    Code Status: DNR, confirmed with the patient  Family Communication: patient   Disposition Plan: pending, need gi clearance for discharge   Consultants:  GI  Procedures:  Possible egd and colonoscopy  Antibiotics:  none   Objective: BP 103/63 (BP Location: Left Arm)   Pulse 70   Temp 98.6 F (37 C) (Oral)   Resp 18   Wt 103.5 kg (228 lb 2.8 oz)   SpO2 96%   Intake/Output Summary (Last 24 hours) at 08/23/16 0853 Last data filed at 08/23/16 0602  Gross per 24 hour  Intake               477 ml  Output              745 ml  Net             -268 ml   Filed Weights   08/22/16 1652  Weight: 103.5 kg (228 lb 2.8 oz)    Exam:   General:  NAD  Cardiovascular: RRR  Respiratory: CTABL  Abdomen: Soft/ND/NT, positive BS  Musculoskeletal: No Edema  Neuro: aaox3  Skin: + petechiae, bruises   Data Reviewed: Basic Metabolic Panel:  Recent Labs Lab 08/22/16 1144 08/23/16 0552  NA 135 137  K 2.7* 2.9*  CL 90* 95*  CO2 29 30  GLUCOSE 142* 96  BUN 16 11  CREATININE 1.14 0.92  CALCIUM 9.0 8.7*   Liver Function Tests:  Recent Labs Lab 08/22/16 1144  AST 179*  ALT 42  ALKPHOS 109  BILITOT 2.2*  PROT 7.7  ALBUMIN 3.8   No results for input(s): LIPASE, AMYLASE in the last 168 hours. No results for input(s): AMMONIA in the last 168 hours. CBC:  Recent Labs Lab 08/22/16 1144 08/23/16 0552  WBC 5.1 4.9  HGB 8.8* 8.5*  HCT 24.5* 23.6*  MCV 110.4* 110.8*  PLT 63* 57*   Cardiac Enzymes:   No results for input(s): CKTOTAL, CKMB, CKMBINDEX, TROPONINI in the last 168 hours. BNP (last 3 results) No results for input(s): BNP in the last 8760 hours.  ProBNP (last 3 results) No results for input(s): PROBNP in the last 8760 hours.  CBG:  Recent Labs Lab 08/22/16 1700 08/22/16 2141 08/23/16 0744  GLUCAP 133* 127* 101*    No results found for this or any previous visit (from the past 240 hour(s)).   Studies: No results found.  Scheduled Meds: . folic acid  1 mg Oral Daily  . levothyroxine  200 mcg Oral QAC breakfast  . LORazepam  0-4 mg Intravenous Q6H   Followed by  . [START ON 08/24/2016] LORazepam  0-4 mg Intravenous Q12H  . magnesium sulfate 1 - 4 g bolus IVPB  2 g Intravenous Once  . multivitamin with minerals  1 tablet Oral Daily  . pantoprazole  40 mg Oral BID  . potassium chloride  40 mEq Oral BID  . thiamine  100 mg Oral Daily   Or  . thiamine  100 mg Intravenous Daily    Continuous Infusions: . dextrose 5 % and 0.9 % NaCl  with KCl 20 mEq/L       Time spent: 71mns  Lemoine Goyne MD, PhD  [facility] Pager [phone] If 7PM-7AM, please contact night-coverage at [email or web address], password [email or web address]12/10/2015, 8:53 AM  LOS: 1 day

## 2016-08-24 LAB — MAGNESIUM: MAGNESIUM: 1.6 mg/dL — AB (ref 1.7–2.4)

## 2016-08-24 LAB — RETICULOCYTES
RBC.: 2.09 MIL/uL — AB (ref 4.22–5.81)
RETIC COUNT ABSOLUTE: 48.1 10*3/uL (ref 19.0–186.0)
RETIC CT PCT: 2.3 % (ref 0.4–3.1)

## 2016-08-24 LAB — CBC WITH DIFFERENTIAL/PLATELET
BASOS PCT: 0 %
Basophils Absolute: 0 10*3/uL (ref 0.0–0.1)
EOS PCT: 1 %
Eosinophils Absolute: 0.1 10*3/uL (ref 0.0–0.7)
HEMATOCRIT: 23.7 % — AB (ref 39.0–52.0)
HEMOGLOBIN: 8.3 g/dL — AB (ref 13.0–17.0)
Lymphocytes Relative: 17 %
Lymphs Abs: 1 10*3/uL (ref 0.7–4.0)
MCH: 39.7 pg — AB (ref 26.0–34.0)
MCHC: 35 g/dL (ref 30.0–36.0)
MCV: 113.4 fL — AB (ref 78.0–100.0)
MONO ABS: 0.7 10*3/uL (ref 0.1–1.0)
MONOS PCT: 12 %
NEUTROS PCT: 70 %
Neutro Abs: 3.8 10*3/uL (ref 1.7–7.7)
PLATELETS: 67 10*3/uL — AB (ref 150–400)
RBC: 2.09 MIL/uL — ABNORMAL LOW (ref 4.22–5.81)
RDW: 15.4 % (ref 11.5–15.5)
WBC: 5.6 10*3/uL (ref 4.0–10.5)

## 2016-08-24 LAB — GLUCOSE, CAPILLARY
GLUCOSE-CAPILLARY: 149 mg/dL — AB (ref 65–99)
GLUCOSE-CAPILLARY: 463 mg/dL — AB (ref 65–99)
Glucose-Capillary: 173 mg/dL — ABNORMAL HIGH (ref 65–99)
Glucose-Capillary: 151 mg/dL — ABNORMAL HIGH (ref 65–99)
Glucose-Capillary: 131 mg/dL — ABNORMAL HIGH (ref 65–99)

## 2016-08-24 LAB — COMPREHENSIVE METABOLIC PANEL
ALK PHOS: 90 U/L (ref 38–126)
ALT: 39 U/L (ref 17–63)
AST: 156 U/L — ABNORMAL HIGH (ref 15–41)
Albumin: 3.2 g/dL — ABNORMAL LOW (ref 3.5–5.0)
Anion gap: 10 (ref 5–15)
BILIRUBIN TOTAL: 1.8 mg/dL — AB (ref 0.3–1.2)
BUN: 7 mg/dL (ref 6–20)
CALCIUM: 8.4 mg/dL — AB (ref 8.9–10.3)
CO2: 29 mmol/L (ref 22–32)
CREATININE: 1 mg/dL (ref 0.61–1.24)
Chloride: 97 mmol/L — ABNORMAL LOW (ref 101–111)
Glucose, Bld: 139 mg/dL — ABNORMAL HIGH (ref 65–99)
Potassium: 3.1 mmol/L — ABNORMAL LOW (ref 3.5–5.1)
Sodium: 136 mmol/L (ref 135–145)
TOTAL PROTEIN: 6.6 g/dL (ref 6.5–8.1)

## 2016-08-24 LAB — HIV ANTIBODY (ROUTINE TESTING W REFLEX): HIV Screen 4th Generation wRfx: NONREACTIVE

## 2016-08-24 MED ORDER — POTASSIUM CHLORIDE CRYS ER 20 MEQ PO TBCR
40.0000 meq | EXTENDED_RELEASE_TABLET | Freq: Once | ORAL | Status: DC
  Filled 2016-08-24: qty 2

## 2016-08-24 MED ORDER — MAGNESIUM SULFATE 4 GM/100ML IV SOLN
4.0000 g | Freq: Once | INTRAVENOUS | Status: AC
  Administered 2016-08-24: 4 g via INTRAVENOUS
  Filled 2016-08-24: qty 100

## 2016-08-24 NOTE — Progress Notes (Signed)
    Progress Note   Subjective  Small amounts of rectal bleeding noted yesterday. None overnight or so far today.     Objective  Vital signs in last 24 hours: Temp:  [98.3 F (36.8 C)-98.5 F (36.9 C)] 98.3 F (36.8 C) (12/03 0453) Pulse Rate:  [73-80] 76 (12/03 0453) Resp:  [16-19] 16 (12/03 0453) BP: (134-140)/(87-92) 136/88 (12/03 0453) SpO2:  [95 %-97 %] 96 % (12/03 0453) Last BM Date: 08/24/16  General: Alert, well-developed, in NAD Heart:  Regular rate and rhythm; no murmurs Chest: Clear to ascultation bilaterally Abdomen:  Soft, nontender and moderately distended. Normal bowel sounds, without guarding, and without rebound.   Extremities:  Without edema. Neurologic:  Alert and  oriented x4; grossly normal neurologically. Psych:  Alert and cooperative. Normal mood and affect.  Intake/Output from previous day: 12/02 0701 - 12/03 0700 In: 1787.5 [P.O.:220; I.V.:1567.5] Out: 500 [Urine:500] Intake/Output this shift: No intake/output data recorded.  Lab Results:  Recent Labs  08/22/16 1144 08/23/16 0552 08/24/16 0300  WBC 5.1 4.9 5.6  HGB 8.8* 8.5* 8.3*  HCT 24.5* 23.6* 23.7*  PLT 63* 57* 67*   BMET  Recent Labs  08/22/16 1144 08/23/16 0552 08/24/16 0300  NA 135 137 136  K 2.7* 2.9* 3.1*  CL 90* 95* 97*  CO2 29 30 29   GLUCOSE 142* 96 139*  BUN 16 11 7   CREATININE 1.14 0.92 1.00  CALCIUM 9.0 8.7* 8.4*   LFT  Recent Labs  08/24/16 0300  PROT 6.6  ALBUMIN 3.2*  AST 156*  ALT 39  ALKPHOS 90  BILITOT 1.8*   PT/INR  Recent Labs  08/22/16 1525  LABPROT 15.0  INR 1.18    Studies/Results: [place] Abdomen Limited Ruq  Result Date: 08/23/2016 CLINICAL DATA:  Alcoholic hepatitis. Elevated AST and total bilirubin. EXAM: US ABDOMEN LIMITED - RIGHT UPPER QUADRANT COMPARISON:  None. FINDINGS: Gallbladder: Tumefactive sludge and small gallstones in the gallbladder. The largest individual stone measures 5 mm in maximum diameter. No gallbladder wall  thickening or pericholecystic fluid. The patient was not focally tender over the gallbladder. Common bile duct: Diameter: 4.5 mm Liver: Mildly echogenic.  Probably enlarged. IMPRESSION: 1. Cholelithiasis and sludge in the gallbladder without evidence of cholecystitis. 2. Mildly echogenic and probably enlarged liver. The increased echogenicity could be due to steatosis or chronic hepatitis. 3. No biliary obstruction. Electronically Signed   By: Beckie SaltsSteven  Reid M.D.   On: 08/23/2016 15:43      Assessment & Plan   1. Small volume hematochezia. Colonoscopy when K+ corrected and he is otherwise stable.   2. Macrocytic anemia, folate deficient. Replace folate. R/O etoh related bone marrow suppression causing thrombocytopenia and component of anemia. Trend CBC.    3. Alcoholism. AST, t bili elevation are very likely alcohol related. Enlarged, echodense liver noted which is likely alcohol related steatosis. Cirrhotic changes and ascites were not noted on US. Observe for withdrawal.  4. Hypokalemia. K=3.1 today. Mg=1.6. Replace and trend K, Mg. Per primary service.   5. Hypothyroidism. Replacement restarted per primary service.   6. Hypertension. Medications restarted per primary service.   7. Cholelithiasis, asymptomatic.   Principal Problem:   Rectal bleeding Active Problems:   Hypothyroidism   Hypertension   Alcoholism /alcohol abuse (HCC)   Alcoholic hepatitis   Hypokalemia   Hyperglycemia   Thrombocytopenia (HCC)   Acute blood loss anemia     LOS: 2 days   Malcolm T. Russella DarStark MD  08/24/2016, 8:32 AM

## 2016-08-24 NOTE — Progress Notes (Signed)
PROGRESS NOTE  Mark Meadows OVZ:[id number] DOB: 06/04/1951 DOA: 08/22/2016 PCP: Mark Meadows  HPI/Recap of past 24 hours:  Report continued blood per rectum with minimal stool Denies abdominal pain, Mark n/v, Mark fever,  Assessment/Plan: Principal Problem:   Rectal bleeding Active Problems:   Hypothyroidism   Hypertension   Alcoholism /alcohol abuse ([place])   Alcoholic hepatitis   Hypokalemia   Hyperglycemia   Thrombocytopenia (HCC)   Acute blood loss anemia  hematochezia. Vital stable, Mark abdominal pain, GI consulted, plant for endoscope when K+ corrected and he is otherwise stable.   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  Hbg/plt 13.6/ 192 on 09/06/2015 Mark indication for  Blood product transfusion  lft elevation, ast>alt, likely from alcohol, normal alk phos, tbili 2.2,  liver US enlarged liver and increased echogenicity could be from steatosis or chronic hepatitis, Cholelithiasis and sludge in the gallbladder without evidence of Cholecystitis  hepatitis panel and hiv screening pending  Alcoholism, on ciwa protocol. Aaox3, Mark tremors on 12/3.  Hypokalemia/hypomagnesemia. Replace k/mag. Hold home meds hctz  Hypothyroidism. Noncompliance with meds, recent tsh 92 on 11/30.  Start synthroid, need repeat tsh in 3-4 weeks.   Hypertension. Hold norvasc, hold hctz, start low dose lopressor with holding parameters   Noncompliance, education provided  FTT: Meadows report he has been feeling extremity fatigued with gait insteability and falls . FTT likely from untreated hypothyrodism and alcohol, treat underline disease, will need PT , home health vs snf    Code Status: DNR, confirmed with the Meadows  Family Communication: Meadows   Disposition Plan: pending, need gi clearance for discharge   Consultants:  LBGI  Procedures:  Possible egd and  colonoscopy  Antibiotics:  none   Objective: BP 136/88 (BP Location: Left Arm)   Pulse 76   Temp 98.3 F (36.8 C) (Oral)   Resp 16   Wt 103.5 kg (228 lb 2.8 oz)   SpO2 96%   Intake/Output Summary (Last 24 hours) at 08/24/16 0909 Last data filed at 08/24/16 4128  Gross per 24 hour  Intake           1787.5 ml  Output              500 ml  Net           1287.5 ml   Filed Weights   08/22/16 1652  Weight: 103.5 kg (228 lb 2.8 oz)    Exam:   General:  Frail but NAD   Cardiovascular: RRR  Respiratory: CTABL  Abdomen: Soft/ND/NT, positive BS  Musculoskeletal: Mark Edema  Neuro: aaox3  Skin: + petechiae, bruises   Data Reviewed: Basic Metabolic Panel:  Recent Labs Lab 08/22/16 1144 08/23/16 0552 08/23/16 0930 08/24/16 0300  NA 135 137  --  136  K 2.7* 2.9*  --  3.1*  CL 90* 95*  --  97*  CO2 29 30  --  29  GLUCOSE 142* 96  --  139*  BUN 16 11  --  7  CREATININE 1.14 0.92  --  1.00  CALCIUM 9.0 8.7*  --  8.4*  MG  --   --  1.5* 1.6*   Liver Function Tests:  Recent Labs Lab 08/22/16 1144 08/24/16 0300  AST 179* 156*  ALT 42 39  ALKPHOS 109 90  BILITOT 2.2* 1.8*  PROT 7.7 6.6  ALBUMIN 3.8 3.2*   Mark results for input(s): LIPASE, AMYLASE  in the last 168 hours. Mark results for input(s): AMMONIA in the last 168 hours. CBC:  Recent Labs Lab 08/22/16 1144 08/23/16 0552 08/24/16 0300  WBC 5.1 4.9 5.6  NEUTROABS  --   --  3.8  HGB 8.8* 8.5* 8.3*  HCT 24.5* 23.6* 23.7*  MCV 110.4* 110.8* 113.4*  PLT 63* 57* 67*   Cardiac Enzymes:   Mark results for input(s): CKTOTAL, CKMB, CKMBINDEX, TROPONINI in the last 168 hours. BNP (last 3 results) Mark results for input(s): BNP in the last 8760 hours.  ProBNP (last 3 results) Mark results for input(s): PROBNP in the last 8760 hours.  CBG:  Recent Labs Lab 08/23/16 0744 08/23/16 1211 08/23/16 1655 08/23/16 2248 08/24/16 0841  GLUCAP 101* 123* 151* 113* 149*    Mark results found for this or any  previous visit (from the past 240 hour(s)).   Studies: US Abdomen Limited Ruq  Result Date: 08/23/2016 CLINICAL DATA:  Alcoholic hepatitis. Elevated AST and total bilirubin. EXAM: US ABDOMEN LIMITED - RIGHT UPPER QUADRANT COMPARISON:  None. FINDINGS: Gallbladder: Tumefactive sludge and small gallstones in the gallbladder. The largest individual stone measures 5 mm in maximum diameter. Mark gallbladder wall thickening or pericholecystic fluid. The [place] was not focally tender over the gallbladder. Common bile duct: Diameter: 4.5 mm Liver: Mildly echogenic.  Probably enlarged. IMPRESSION: 1. Cholelithiasis and sludge in the gallbladder without evidence of cholecystitis. 2. Mildly echogenic and probably enlarged liver. The increased echogenicity could be due to steatosis or chronic hepatitis. 3. Mark biliary obstruction. Electronically Signed   By: Claudie Revering M.D.   On: 08/23/2016 15:43    Scheduled Meds: . folic acid  1 mg Oral Daily  . levothyroxine  200 mcg Oral QAC breakfast  . LORazepam  0-4 mg Intravenous Q6H   Followed by  . LORazepam  0-4 mg Intravenous Q12H  . magnesium sulfate 1 - 4 g bolus IVPB  4 g Intravenous Once  . metoprolol tartrate  12.5 mg Oral BID  . multivitamin with minerals  1 tablet Oral Daily  . pantoprazole  40 mg Oral BID  . potassium chloride  40 mEq Oral BID  . potassium chloride  40 mEq Oral Once  . thiamine  100 mg Oral Daily   Or  . thiamine  100 mg Intravenous Daily    Continuous Infusions: . dextrose 5 % and 0.9 % NaCl with KCl 20 mEq/L 75 mL/hr at 08/24/16 8934     Time spent: 74mns  Mark Feeley MD, PhD  [facility] Pager [phone] If 7PM-7AM, please contact night-coverage at [email or web address], password T[facility]12/11/2015, 9:09 AM  LOS: 2 days

## 2016-08-25 LAB — COMPREHENSIVE METABOLIC PANEL
ALBUMIN: 3.3 g/dL — AB (ref 3.5–5.0)
ALK PHOS: 86 U/L (ref 38–126)
ALT: 47 U/L (ref 17–63)
AST: 153 U/L — ABNORMAL HIGH (ref 15–41)
Anion gap: 7 (ref 5–15)
BILIRUBIN TOTAL: 1.6 mg/dL — AB (ref 0.3–1.2)
BUN: 8 mg/dL (ref 6–20)
CALCIUM: 8.1 mg/dL — AB (ref 8.9–10.3)
CO2: 23 mmol/L (ref 22–32)
CREATININE: 0.98 mg/dL (ref 0.61–1.24)
Chloride: 104 mmol/L (ref 101–111)
GFR calc Af Amer: >60 mL/min (ref >60)
GFR calc non Af Amer: >60 mL/min (ref >60)
GLUCOSE: 127 mg/dL — AB (ref 65–99)
Potassium: 3.8 mmol/L (ref 3.5–5.1)
SODIUM: 134 mmol/L — AB (ref 135–145)
Total Protein: 6.9 g/dL (ref 6.5–8.1)

## 2016-08-25 LAB — CBC
HEMATOCRIT: 24.6 % — AB (ref 39.0–52.0)
HEMOGLOBIN: 8.6 g/dL — AB (ref 13.0–17.0)
MCH: 39.8 pg — AB (ref 26.0–34.0)
MCHC: 35 g/dL (ref 30.0–36.0)
MCV: 113.9 fL — ABNORMAL HIGH (ref 78.0–100.0)
Platelets: 90 10*3/uL — ABNORMAL LOW (ref 150–400)
RBC: 2.16 MIL/uL — AB (ref 4.22–5.81)
RDW: 15.8 % — ABNORMAL HIGH (ref 11.5–15.5)
WBC: 6.9 10*3/uL (ref 4.0–10.5)

## 2016-08-25 LAB — GLUCOSE, CAPILLARY
GLUCOSE-CAPILLARY: 148 mg/dL — AB (ref 65–99)
Glucose-Capillary: 119 mg/dL — ABNORMAL HIGH (ref 65–99)
Glucose-Capillary: 163 mg/dL — ABNORMAL HIGH (ref 65–99)
Glucose-Capillary: 165 mg/dL — ABNORMAL HIGH (ref 65–99)

## 2016-08-25 LAB — MAGNESIUM: Magnesium: 2 mg/dL (ref 1.7–2.4)

## 2016-08-25 MED ORDER — PEG-KCL-NACL-NASULF-NA ASC-C 100 G PO SOLR
0.5000 | Freq: Once | ORAL | Status: AC
  Administered 2016-08-26: 100 g via ORAL
  Filled 2016-08-25: qty 1

## 2016-08-25 MED ORDER — HYDROCORTISONE NA SUCCINATE PF 100 MG IJ SOLR
50.0000 mg | Freq: Four times a day (QID) | INTRAMUSCULAR | Status: AC
  Administered 2016-08-25 (×2): 50 mg via INTRAVENOUS
  Filled 2016-08-25 (×2): qty 2

## 2016-08-25 MED ORDER — PEG-KCL-NACL-NASULF-NA ASC-C 100 G PO SOLR
0.5000 | Freq: Once | ORAL | Status: AC
  Administered 2016-08-25: 100 g via ORAL
  Filled 2016-08-25: qty 1

## 2016-08-25 MED ORDER — METOCLOPRAMIDE HCL 5 MG/5ML PO SOLN
10.0000 mg | Freq: Once | ORAL | Status: AC
  Administered 2016-08-26: 10 mg via ORAL
  Filled 2016-08-25: qty 10

## 2016-08-25 MED ORDER — BISACODYL 5 MG PO TBEC
10.0000 mg | DELAYED_RELEASE_TABLET | Freq: Three times a day (TID) | ORAL | Status: AC
  Administered 2016-08-25 (×2): 10 mg via ORAL
  Filled 2016-08-25 (×2): qty 2

## 2016-08-25 MED ORDER — LEVOTHYROXINE SODIUM 100 MCG IV SOLR
200.0000 ug | Freq: Once | INTRAVENOUS | Status: AC
  Administered 2016-08-25: 200 ug via INTRAVENOUS
  Filled 2016-08-25: qty 10

## 2016-08-25 MED ORDER — LEVOTHYROXINE SODIUM 100 MCG IV SOLR: 100.0000 ug | Freq: Once | INTRAVENOUS | Status: DC

## 2016-08-25 MED ORDER — LEVOTHYROXINE SODIUM 100 MCG IV SOLR
100.0000 ug | Freq: Every day | INTRAVENOUS | Status: DC
  Administered 2016-08-26: 100 ug via INTRAVENOUS
  Filled 2016-08-25: qty 5

## 2016-08-25 MED ORDER — METOCLOPRAMIDE HCL 5 MG/5ML PO SOLN
10.0000 mg | Freq: Once | ORAL | Status: AC
  Administered 2016-08-25: 10 mg via ORAL
  Filled 2016-08-25: qty 10

## 2016-08-25 MED ORDER — PEG-KCL-NACL-NASULF-NA ASC-C 100 G PO SOLR: 1.0000 | Freq: Once | ORAL | Status: DC

## 2016-08-25 MED ORDER — SODIUM CHLORIDE 0.9 % IV SOLN
INTRAVENOUS | Status: AC
  Administered 2016-08-25 – 2016-08-26 (×2): via INTRAVENOUS

## 2016-08-25 NOTE — Progress Notes (Signed)
PROGRESS NOTE  Mark Meadows KVQ:[id number] DOB: 03/31/1951 DOA: 08/22/2016 PCP: No PCP Per Patient  HPI/Recap of past 24 hours:  Very lethargic, though oriented x3, he declined my offer to call his family Report continued blood per rectum with minimal stool Denies abdominal pain, no n/v,no sob, no fever, bp stable  Assessment/Plan: Principal Problem:   Rectal bleeding Active Problems:   Hypothyroidism   Hypertension   Alcoholism /alcohol abuse ([place])   Alcoholic hepatitis   Hypokalemia   Hyperglycemia   Thrombocytopenia ([place])   Acute blood loss anemia  hematochezia. 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  Hbg/plt 13.6/ 192 on 09/06/2015 No indication for  Blood product transfusion hgb stable above 8, plt seems improving  lft elevation, ast>alt, likely from alcohol, normal alk phos, tbili 2.2,  liver US enlarged liver and increased echogenicity could be from steatosis or chronic hepatitis, Cholelithiasis and sludge in the gallbladder without evidence of Cholecystitis  hepatitis panel pending, hiv screening negative  Alcoholism, on ciwa protocol. Aaox3, no tremors on 12/3.  Hypokalemia/hypomagnesemia. Replace k/mag. Hold home meds hctz  Severe Hypothyroidism with significant weakness and lethargy  patient has been Noncompliance with meds, TSH level at 92 on 11/30.  Patient was sent to the hospital by PMD due to progressive weakness. Patient does not have pretibial myxedema, bp stable, np hypothermia, but very lethargic, concerned about gi absorption of synthroid, will changed to iv synthroid, add stress dose steroids for now  Hypertension. Hold norvasc, hold hctz, start low dose lopressor with holding parameters   Noncompliance, education provided  FTT: patient report he has  been feeling extremity fatigued with gait insteability and falls . FTT likely from untreated hypothyrodism and alcohol, treat underline disease, will need PT , home health vs snf    Code Status: DNR, confirmed with the patient  Family Communication: patient , patient declined my offer to call his family  Disposition Plan: pending, need gi clearance for discharge   Consultants:  LBGI  Procedures:  Possible egd and colonoscopy  Antibiotics:  none   Objective: BP 129/84 (BP Location: Left Arm)   Pulse 70   Temp 98.2 F (36.8 C) (Oral)   Resp 17   Wt 103.5 kg (228 lb 2.8 oz)   SpO2 100%   Intake/Output Summary (Last 24 hours) at 08/25/16 1103 Last data filed at 08/25/16 0600  Gross per 24 hour  Intake          2336.25 ml  Output              600 ml  Net          1736.25 ml   Filed Weights   08/22/16 1652  Weight: 103.5 kg (228 lb 2.8 oz)    Exam:   General:  Frail, lethargic, oriented x3   Cardiovascular: RRR  Respiratory: CTABL  Abdomen: Soft/ND/NT, positive BS  Musculoskeletal: No Edema  Neuro: no focal deficit, lethargic, oriented x3  Skin: + petechiae, bruises   Data Reviewed: Basic Metabolic Panel:  Recent Labs Lab 08/22/16 1144 08/23/16 0552 08/23/16 0930 08/24/16 0300 08/25/16 0422  NA 135 137  --  136 134*  K 2.7* 2.9*  --  3.1* 3.8  CL 90* 95*  --  97* 104  CO2 29 30  --  29 23  GLUCOSE 142* 96  --  139*  127*  BUN 16 11  --  7 8  CREATININE 1.14 0.92  --  1.00 0.98  CALCIUM 9.0 8.7*  --  8.4* 8.1*  MG  --   --  1.5* 1.6* 2.0   Liver Function Tests:  Recent Labs Lab 08/22/16 1144 08/24/16 0300 08/25/16 0422  AST 179* 156* 153*  ALT 42 39 47  ALKPHOS 109 90 86  BILITOT 2.2* 1.8* 1.6*  PROT 7.7 6.6 6.9  ALBUMIN 3.8 3.2* 3.3*   No results for input(s): LIPASE, AMYLASE in the last 168 hours. No results for input(s): AMMONIA in the last 168 hours. CBC:  Recent Labs Lab 08/22/16 1144 08/23/16 0552 08/24/16 0300  08/25/16 0422  WBC 5.1 4.9 5.6 6.9  NEUTROABS  --   --  3.8  --   HGB 8.8* 8.5* 8.3* 8.6*  HCT 24.5* 23.6* 23.7* 24.6*  MCV 110.4* 110.8* 113.4* 113.9*  PLT 63* 57* 67* 90*   Cardiac Enzymes:   No results for input(s): CKTOTAL, CKMB, CKMBINDEX, TROPONINI in the last 168 hours. BNP (last 3 results) No results for input(s): BNP in the last 8760 hours.  ProBNP (last 3 results) No results for input(s): PROBNP in the last 8760 hours.  CBG:  Recent Labs Lab 08/24/16 1301 08/24/16 1310 08/24/16 1721 08/24/16 2219 08/25/16 0806  GLUCAP 463* 173* 151* 131* 119*    No results found for this or any previous visit (from the past 240 hour(s)).   Studies: No results found.  Scheduled Meds: . bisacodyl  10 mg Oral R8V  . folic acid  1 mg Oral Daily  . hydrocortisone sod succinate (SOLU-CORTEF) inj  50 mg Intravenous Q6H  . [START ON 08/26/2016] levothyroxine  100 mcg Intravenous Daily  . levothyroxine  200 mcg Intravenous Once  . LORazepam  0-4 mg Intravenous Q12H  . metoprolol tartrate  12.5 mg Oral BID  . multivitamin with minerals  1 tablet Oral Daily  . pantoprazole  40 mg Oral BID  . thiamine  100 mg Oral Daily    Continuous Infusions:    Time spent: 52mns  Nakea Gouger MD, PhD  [facility] Pager [phone] If 7PM-7AM, please contact night-coverage at [email or web address], password T[facility]12/12/2015, 11:03 AM  LOS: 3 days

## 2016-08-25 NOTE — Progress Notes (Signed)
        Daily Rounding Note  08/25/2016, 8:43 AM  LOS: 3 days   SUBJECTIVE:   Chief complaint: weakness, hard to say if imiproved or not being he is pretty much staying in bed.  Bleeding PR, persist, minor  Appetite is good  OBJECTIVE:         Vital signs in last 24 hours:    Temp:  [97.8 F (36.6 C)-98.9 F (37.2 C)] 98.2 F (36.8 C) (12/04 0522) Pulse Rate:  [61-88] 70 (12/04 0522) Resp:  [17] 17 (12/04 0522) BP: (123-142)/(75-96) 129/84 (12/04 0522) SpO2:  [96 %-100 %] 100 % (12/04 0522) Last BM Date: 08/24/16 Filed Weights   08/22/16 1652  Weight: 103.5 kg (228 lb 2.8 oz)   General: looks old for age, obese and unwell.   Heart: RRR Chest: clear bil.  Reduced BS overall Abdomen: protuberant, soft, NT.  Active BS  Extremities: no CCE.   Neuro/Psych:  Oriented x 3.  No asterixis.  Alert, appropriate.  Some psychomotor retardation.   Intake/Output from previous day: 12/03 0701 - 12/04 0700 In: 2456.3 [P.O.:670; I.V.:1786.3] Out: 600 [Urine:600]  Intake/Output this shift: No intake/output data recorded.  Lab Results:  Recent Labs  08/23/16 0552 08/24/16 0300 08/25/16 0422  WBC 4.9 5.6 6.9  HGB 8.5* 8.3* 8.6*  HCT 23.6* 23.7* 24.6*  PLT 57* 67* 90*   BMET  Recent Labs  08/23/16 0552 08/24/16 0300 08/25/16 0422  NA 137 136 134*  K 2.9* 3.1* 3.8  CL 95* 97* 104  CO2 30 29 23  GLUCOSE 96 139* 127*  BUN 11 7 8  CREATININE 0.92 1.00 0.98  CALCIUM 8.7* 8.4* 8.1*   LFT  Recent Labs  08/22/16 1144 08/24/16 0300 08/25/16 0422  PROT 7.7 6.6 6.9  ALBUMIN 3.8 3.2* 3.3*  AST 179* 156* 153*  ALT 42 39 47  ALKPHOS 109 90 86  BILITOT 2.2* 1.8* 1.6*   PT/INR  Recent Labs  08/22/16 1525  LABPROT 15.0  INR 1.18   Hepatitis Panel No results for input(s): HEPBSAG, HCVAB, HEPAIGM, HEPBIGM in the last 72 hours.  Studies/Results: Us Abdomen Limited Ruq  Result Date: 08/23/2016 CLINICAL DATA:   Alcoholic hepatitis. Elevated AST and total bilirubin. EXAM: US ABDOMEN LIMITED - RIGHT UPPER QUADRANT COMPARISON:  None. FINDINGS: Gallbladder: Tumefactive sludge and small gallstones in the gallbladder. The largest individual stone measures 5 mm in maximum diameter. No gallbladder wall thickening or pericholecystic fluid. The patient was not focally tender over the gallbladder. Common bile duct: Diameter: 4.5 mm Liver: Mildly echogenic.  Probably enlarged. IMPRESSION: 1. Cholelithiasis and sludge in the gallbladder without evidence of cholecystitis. 2. Mildly echogenic and probably enlarged liver. The increased echogenicity could be due to steatosis or chronic hepatitis. 3. No biliary obstruction. Electronically Signed   By: Steven  Reid M.D.   On: 08/23/2016 15:43   Scheduled Meds: . folic acid  1 mg Oral Daily  . levothyroxine  200 mcg Oral QAC breakfast  . LORazepam  0-4 mg Intravenous Q12H  . metoprolol tartrate  12.5 mg Oral BID  . multivitamin with minerals  1 tablet Oral Daily  . pantoprazole  40 mg Oral BID  . thiamine  100 mg Oral Daily   Continuous Infusions: . dextrose 5 % and 0.9 % NaCl with KCl 20 mEq/L 75 mL/hr at 08/24/16 0611   PRN Meds:.acetaminophen **OR** acetaminophen, LORazepam **OR** LORazepam, ondansetron **OR** ondansetron (ZOFRAN) IV    ASSESMENT:   *    Minor rectal bleeding  *  Macrocytic anemia.  Low folate, B12 normal. .   *  Thrombocytopenia.  Coags normal.    *  ETOH hepatitis, chronic alcoholism.  Increased liver echogenicity (GB sludge/stones, chronic hepatitis vs steatosis per 10/2015 ultrasound).  Hepatitis panel in probress.   *  Hypokalemia.  Corrected.   *  Hypomagnesia. Corrected.  *  Hx hypothyroidism.  Off replacement for many months,  Synthroid now restarted.  No recent TSH in Epic.    PLAN   *  Timing of colonoscopy?  Check TSH, CBC in AM.  .     Jennye MoccasinSarah Gribbin  08/25/2016, 8:43 AM Pager: [phone]     Attending physician's note    I have taken an interval history, reviewed the chart and examined the patient. I agree with the Advanced Practitioner's note, impression and recommendations. K has corrected. Will schedule colonoscopy for tomorrow.    Claudette HeadMalcolm Shadia Larose, MD Clementeen GrahamFACG [phone] Mon-Fri 8a-5p [phone] after 5p, weekends, holidays

## 2016-08-26 ENCOUNTER — Inpatient Hospital Stay (HOSPITAL_COMMUNITY): Payer: Medicare Other | Admitting: Certified Registered Nurse Anesthetist

## 2016-08-26 ENCOUNTER — Encounter (HOSPITAL_COMMUNITY)
Admission: EM | Disposition: A | Discharge status: Home / Self Care | Payer: Self-pay | Source: Home / Self Care | Attending: Internal Medicine

## 2016-08-26 ENCOUNTER — Encounter (HOSPITAL_COMMUNITY): Payer: Self-pay | Admitting: *Deleted

## 2016-08-26 DIAGNOSIS — K921 Melena: Principal | ICD-10-CM

## 2016-08-26 HISTORY — PX: COLONOSCOPY: SHX5424

## 2016-08-26 LAB — CBC
HCT: 22.5 % — ABNORMAL LOW (ref 39.0–52.0)
HEMOGLOBIN: 8 g/dL — AB (ref 13.0–17.0)
MCH: 41 pg — AB (ref 26.0–34.0)
MCHC: 35.6 g/dL (ref 30.0–36.0)
MCV: 115.4 fL — ABNORMAL HIGH (ref 78.0–100.0)
Platelets: 121 10*3/uL — ABNORMAL LOW (ref 150–400)
RBC: 1.95 MIL/uL — ABNORMAL LOW (ref 4.22–5.81)
RDW: 16.1 % — AB (ref 11.5–15.5)
WBC: 7.6 10*3/uL (ref 4.0–10.5)

## 2016-08-26 LAB — COMPREHENSIVE METABOLIC PANEL
ALBUMIN: 3.2 g/dL — AB (ref 3.5–5.0)
ALK PHOS: 126 U/L (ref 38–126)
ALT: 132 U/L — ABNORMAL HIGH (ref 17–63)
ANION GAP: 10 (ref 5–15)
AST: 400 U/L — ABNORMAL HIGH (ref 15–41)
BILIRUBIN TOTAL: 3.3 mg/dL — AB (ref 0.3–1.2)
BUN: 7 mg/dL (ref 6–20)
CALCIUM: 8.4 mg/dL — AB (ref 8.9–10.3)
CO2: 21 mmol/L — AB (ref 22–32)
Chloride: 109 mmol/L (ref 101–111)
Creatinine, Ser: 0.96 mg/dL (ref 0.61–1.24)
GFR calc non Af Amer: >60 mL/min (ref >60)
GLUCOSE: 124 mg/dL — AB (ref 65–99)
POTASSIUM: 3.4 mmol/L — AB (ref 3.5–5.1)
SODIUM: 140 mmol/L (ref 135–145)
TOTAL PROTEIN: 6.9 g/dL (ref 6.5–8.1)

## 2016-08-26 LAB — GLUCOSE, CAPILLARY
GLUCOSE-CAPILLARY: 111 mg/dL — AB (ref 65–99)
GLUCOSE-CAPILLARY: 101 mg/dL — AB (ref 65–99)
GLUCOSE-CAPILLARY: 103 mg/dL — AB (ref 65–99)
GLUCOSE-CAPILLARY: 113 mg/dL — AB (ref 65–99)

## 2016-08-26 LAB — HEPATITIS PANEL, ACUTE
HCV Ab: <0.1 s/co ratio (ref 0.0–0.9)
HEP A IGM: NEGATIVE
HEP B C IGM: NEGATIVE
Hepatitis B Surface Ag: NEGATIVE

## 2016-08-26 LAB — TSH: TSH: 31.63 u[IU]/mL — AB (ref 0.350–4.500)

## 2016-08-26 LAB — MAGNESIUM: Magnesium: 1.8 mg/dL (ref 1.7–2.4)

## 2016-08-26 SURGERY — COLONOSCOPY
Anesthesia: Monitor Anesthesia Care

## 2016-08-26 MED ORDER — MIDAZOLAM HCL 5 MG/5ML IJ SOLN
INTRAMUSCULAR | Status: DC | PRN
  Administered 2016-08-26: 1 mg via INTRAVENOUS

## 2016-08-26 MED ORDER — PROPOFOL 500 MG/50ML IV EMUL
INTRAVENOUS | Status: DC | PRN
  Administered 2016-08-26: 100 ug/kg/min via INTRAVENOUS

## 2016-08-26 MED ORDER — POTASSIUM CHLORIDE CRYS ER 20 MEQ PO TBCR
40.0000 meq | EXTENDED_RELEASE_TABLET | Freq: Once | ORAL | Status: AC
  Administered 2016-08-26: 40 meq via ORAL
  Filled 2016-08-26: qty 2

## 2016-08-26 MED ORDER — LACTATED RINGERS IV SOLN
INTRAVENOUS | Status: DC | PRN
  Administered 2016-08-26: 14:00:00 via INTRAVENOUS

## 2016-08-26 MED ORDER — MAGNESIUM OXIDE 400 (241.3 MG) MG PO TABS
400.0000 mg | ORAL_TABLET | Freq: Every day | ORAL | Status: DC
  Administered 2016-08-27 – 2016-08-28 (×2): 400 mg via ORAL
  Filled 2016-08-26 (×2): qty 1

## 2016-08-26 MED ORDER — PROPOFOL 10 MG/ML IV BOLUS
INTRAVENOUS | Status: DC | PRN
  Administered 2016-08-26: 20 mg via INTRAVENOUS
  Administered 2016-08-26: 10 mg via INTRAVENOUS

## 2016-08-26 MED ORDER — POTASSIUM CHLORIDE 2 MEQ/ML IV SOLN
INTRAVENOUS | Status: DC
  Administered 2016-08-26 – 2016-08-27 (×2): via INTRAVENOUS
  Filled 2016-08-26 (×5): qty 1000

## 2016-08-26 MED ORDER — FENTANYL CITRATE (PF) 100 MCG/2ML IJ SOLN
INTRAMUSCULAR | Status: DC | PRN
  Administered 2016-08-26: 50 ug via INTRAVENOUS

## 2016-08-26 MED ORDER — SODIUM CHLORIDE 0.9 % IV SOLN: INTRAVENOUS | Status: DC

## 2016-08-26 MED ORDER — HYDROCORTISONE ACETATE 25 MG RE SUPP
25.0000 mg | Freq: Two times a day (BID) | RECTAL | Status: DC
  Administered 2016-08-26 – 2016-08-28 (×4): 25 mg via RECTAL
  Filled 2016-08-26 (×5): qty 1

## 2016-08-26 NOTE — Interval H&P Note (Signed)
History and Physical Interval Note:  08/26/2016 2:13 PM  Mark BaliJeffrey Meadows  has presented today for surgery, with the diagnosis of hematochezia.  The various methods of treatment have been discussed with the patient and family. After consideration of risks, benefits and other options for treatment, the patient has consented to  Procedure(s): COLONOSCOPY (N/A) as a surgical intervention .  The patient's history has been reviewed, patient examined, no change in status, stable for surgery.  I have reviewed the patient's chart and labs.  Questions were answered to the patient's satisfaction.     Venita LickMalcolm T. Russella DarStark

## 2016-08-26 NOTE — Op Note (Addendum)
[facility] Patient Name: Mark Meadows Procedure Date : 08/26/2016 MRN: [id number] Attending MD: Meryl Dare , MD Date of Birth: [birth year] CSN: [id number] Age: 65 Admit Type: Inpatient Procedure:                Colonoscopy Indications:              Hematochezia Providers:                Venita Lick. Russella Dar, MD, Michel Bickers, RN,                            Kandice Robinsons, Technician Referring MD:             [facility] Medicines:                Monitored Anesthesia Care Complications:            No immediate complications. Estimated blood loss:                            None. Estimated Blood Loss:     Estimated blood loss: none. Procedure:                Pre-Anesthesia Assessment:                           - Prior to the procedure, a History and Physical                            was performed, and patient medications and                            allergies were reviewed. The patient's tolerance of                            previous anesthesia was also reviewed. The risks                            and benefits of the procedure and the sedation                            options and risks were discussed with the patient.                            All questions were answered, and informed consent                            was obtained. Prior Anticoagulants: The patient has                            taken no previous anticoagulant or antiplatelet                            agents. ASA Grade Assessment: III - A patient with                            severe systemic  disease. After reviewing the risks                            and benefits, the patient was deemed in                            satisfactory condition to undergo the procedure.                           After obtaining informed consent, the colonoscope                            was passed under direct vision. Throughout the                            procedure, the patient's blood  pressure, pulse, and                            oxygen saturations were monitored continuously. The                            EC-[id number] ([id number] scope was introduced through                            the anus and advanced to the the cecum, identified                            by appendiceal orifice and ileocecal valve. The                            ileocecal valve, appendiceal orifice, and rectum                            were photographed. The quality of the bowel                            preparation was adequate. The colonoscopy was                            performed without difficulty. The patient tolerated                            the procedure well. Scope In: 2:28:10 PM Scope Out: 2:40:32 PM Scope Withdrawal Time: 0 hours 10 minutes 7 seconds  Total Procedure Duration: 0 hours 12 minutes 22 seconds  Findings:      The perianal and digital rectal examinations were normal.      Internal hemorrhoids were found during retroflexion. The hemorrhoids       were medium-sized and Grade I (internal hemorrhoids that do not       prolapse).      Multiple medium-mouthed diverticula were found in the sigmoid colon,       descending colon, transverse colon and ascending colon.      The exam was otherwise without abnormality on direct and retroflexion       views. Impression:               -  Internal hemorrhoids.                           - Diverticulosis in the sigmoid colon, in the                            descending colon, in the transverse colon and in                            the ascending colon.                           - The examination was otherwise normal on direct                            and retroflexion views.                           - No specimens collected. Moderate Sedation:      none/MAC Recommendation:           - Repeat colonoscopy in 10 years for screening                            purposes with Dr. Yancey FlemingsJohn Perry.                           - Patient has a  contact number available for                            emergencies. The signs and symptoms of potential                            delayed complications were discussed with the                            patient. Return to normal activities tomorrow.                            Written discharge instructions were provided to the                            patient.                           - High fiber diet.                           - Continue present medications.                           - GI follow up as needed with Dr. Yancey FlemingsJohn Perry. GI                            signing off.                           -  Anusol supp bid prn hemorrhoidal symptoms Procedure Code(s):        --- Professional ---                           [phone], Colonoscopy, flexible; diagnostic, including                            collection of specimen(s) by brushing or washing,                            when performed (separate procedure) Diagnosis Code(s):        --- Professional ---                           K92.1, Melena (includes Hematochezia) CPT copyright 2016 American Medical Association. All rights reserved. The codes documented in this report are preliminary and upon coder review may  be revised to meet current compliance requirements. Meryl Dare, MD 08/26/2016 2:56:59 PM This report has been signed electronically. Number of Addenda: 0

## 2016-08-26 NOTE — Anesthesia Preprocedure Evaluation (Signed)
Anesthesia Evaluation  Patient identified by MRN, date of birth, ID band Patient awake    Reviewed: Allergy & Precautions, NPO status , Patient's Chart, lab work & pertinent test results  Airway Mallampati: III  TM Distance: >3 FB Neck ROM: Full    Dental   Pulmonary neg pulmonary ROS,    breath sounds clear to auscultation       Cardiovascular hypertension, Pt. on medications  Rhythm:Regular Rate:Normal     Neuro/Psych negative neurological ROS     GI/Hepatic negative GI ROS, (+)     substance abuse  alcohol use, Hepatitis -  Endo/Other  Hypothyroidism   Renal/GU negative Renal ROS     Musculoskeletal   Abdominal   Peds  Hematology  (+) anemia ,   Anesthesia Other Findings   Reproductive/Obstetrics                             Anesthesia Physical Anesthesia Plan  ASA: III  Anesthesia Plan: MAC   Post-op Pain Management:    Induction: Intravenous  Airway Management Planned: Natural Airway and Simple Face Mask  Additional Equipment:   Intra-op Plan:   Post-operative Plan:   Informed Consent: I have reviewed the patients History and Physical, chart, labs and discussed the procedure including the risks, benefits and alternatives for the proposed anesthesia with the patient or authorized representative who has indicated his/her understanding and acceptance.     Plan Discussed with:   Anesthesia Plan Comments:         Anesthesia Quick Evaluation

## 2016-08-26 NOTE — Evaluation (Signed)
Physical Therapy Evaluation Patient Details Name: Mark BaliJeffrey Meadows MRN: [id number] DOB: 1950/09/30 Today's Date: 08/26/2016   History of Present Illness  Pt admit with rectal bleeding.  Hypothyroidism and alcoholism as well.    Clinical Impression  Pt admitted with above diagnosis. Pt currently with functional limitations due to the deficits listed below (see PT Problem List). Pt able to ambulate with RW with overall good stability with RW.  Discussed that PT feels that pt would benefit from a short SNF stay as he reports multiple falls in the past year.  Pt states that he feels so much better now because he feels that he was on the wrong medicine.  Pt really wants to go home and is declining HHPT and HHOT.  He does however state he would use a RW in the home. Pt is aware of SNF for rehab recommendation but again declines.  Will follow acutely.   Pt will benefit from skilled PT to increase their independence and safety with mobility to allow discharge to the venue listed below.      Follow Up Recommendations SNF (Declines SNF and HH therapies. Recommend short SNF stay ultimately.)    Equipment Recommendations  Rolling walker with 5" wheels    Recommendations for Other Services       Precautions / Restrictions Precautions Precautions: Fall Precaution Comments: Pt reports he started falling in Oct 2016 and broke ribs.  In March 2017, fell 4 x and broke ribs again.  Has had other falls since then not resulting in injuries.  Restrictions Weight Bearing Restrictions: No      Mobility  Bed Mobility Overal bed mobility: Independent                Transfers Overall transfer level: Needs assistance Equipment used: Rolling walker (2 wheeled) Transfers: Sit to/from Stand Sit to Stand: Supervision            Ambulation/Gait Ambulation/Gait assistance: Min guard Ambulation Distance (Feet): 300 Feet Assistive device: Rolling walker (2 wheeled) Gait Pattern/deviations: Step-through  pattern;Decreased stride length;Trunk flexed;Wide base of support   Gait velocity interpretation: Below normal speed for age/gender General Gait Details: Overall, pt did well with RW.  Occasionally had to cues to stay close to RW.  Had more difficulty with this when turning.  Pt is generally steady with RW.   Stairs            Wheelchair Mobility    Modified Rankin (Stroke Patients Only)       Balance Overall balance assessment: Needs assistance;History of Falls Sitting-balance support: No upper extremity supported;Feet supported Sitting balance-Leahy Scale: Good     Standing balance support: Bilateral upper extremity supported;During functional activity Standing balance-Leahy Scale: Poor Standing balance comment: relies on bil UE support for balance.              High level balance activites: Direction changes;Turns;Sudden stops;Backward walking High Level Balance Comments: min guard assist for above with RW.              Pertinent Vitals/Pain Pain Assessment: No/denies pain  VSS    Home Living Family/patient expects to be discharged to:: Private residence Living Arrangements: Alone   Type of Home: House Home Access: Stairs to enter Entrance Stairs-Rails: Right;Left;Can reach both Entrance Stairs-Number of Steps: 6 Home Layout: One level Home Equipment: Cane - single point;Grab bars - tub/shower      Prior Function Level of Independence: Independent with assistive device(s)  Comments: Pt used cane but sounds like he used walls and furniture more at home.  He states he thinks his meds were messing him up as he is better now that they are changing his meds.      Hand Dominance        Extremity/Trunk Assessment   Upper Extremity Assessment: Defer to OT evaluation           Lower Extremity Assessment: Generalized weakness      Cervical / Trunk Assessment: Normal  Communication   Communication: No difficulties  Cognition  Arousal/Alertness: Awake/alert Behavior During Therapy: WFL for tasks assessed/performed Overall Cognitive Status: Within Functional Limits for tasks assessed                      General Comments      Exercises     Assessment/Plan    PT Assessment Patient needs continued PT services  PT Problem List Decreased activity tolerance;Decreased balance;Decreased mobility;Decreased knowledge of use of DME;Decreased safety awareness;Decreased knowledge of precautions          PT Treatment Interventions DME instruction;Gait training;Functional mobility training;Stair training;Therapeutic activities;Therapeutic exercise;Balance training;Patient/family education    PT Goals (Current goals can be found in the Care Plan section)  Acute Rehab PT Goals Patient Stated Goal: to go home PT Goal Formulation: With patient Time For Goal Achievement: 09/02/16 Potential to Achieve Goals: Good    Frequency Min 3X/week   Barriers to discharge Decreased caregiver support (pt has no family )      Co-evaluation               End of Session Equipment Utilized During Treatment: Gait belt Activity Tolerance: Patient limited by fatigue Patient left: in chair;with call bell/phone within reach;with chair alarm set Nurse Communication: Mobility status         Time: [phone] PT Time Calculation (min) (ACUTE ONLY): 29 min   Charges:   PT Evaluation $PT Eval Moderate Complexity: 1 Procedure PT Treatments $Gait Training: 8-22 mins   PT G Codes:        Berline LopesDawn F Corinna Burkman 08/26/2016, 9:44 AM Miri Jose,PT Acute Rehabilitation [phone] (pager)

## 2016-08-26 NOTE — Care Management Important Message (Signed)
Important Message  Patient Details  Name: Mark Meadows MRN: [id number] Date of Birth: 1951/01/13   Medicare Important Message Given:  Yes    Ruhi Kopke 08/26/2016, 11:05 AM

## 2016-08-26 NOTE — H&P (View-Only) (Signed)
Daily Rounding Note  08/25/2016, 8:43 AM  LOS: 3 days   SUBJECTIVE:   Chief complaint: weakness, hard to say if imiproved or not being he is pretty much staying in bed.  Bleeding PR, persist, minor  Appetite is good  OBJECTIVE:         Vital signs in last 24 hours:    Temp:  [97.8 F (36.6 C)-98.9 F (37.2 C)] 98.2 F (36.8 C) (12/04 0522) Pulse Rate:  [61-88] 70 (12/04 0522) Resp:  [17] 17 (12/04 0522) BP: (123-142)/(75-96) 129/84 (12/04 0522) SpO2:  [96 %-100 %] 100 % (12/04 0522) Last BM Date: 08/24/16 Filed Weights   08/22/16 1652  Weight: 103.5 kg (228 lb 2.8 oz)   General: looks old for age, obese and unwell.   Heart: RRR Chest: clear bil.  Reduced BS overall Abdomen: protuberant, soft, NT.  Active BS  Extremities: no CCE.   Neuro/Psych:  Oriented x 3.  No asterixis.  Alert, appropriate.  Some psychomotor retardation.   Intake/Output from previous day: 12/03 0701 - 12/04 0700 In: 2456.3 [P.O.:670; I.V.:1786.3] Out: 600 [Urine:600]  Intake/Output this shift: No intake/output data recorded.  Lab Results:  Recent Labs  08/23/16 0552 08/24/16 0300 08/25/16 0422  WBC 4.9 5.6 6.9  HGB 8.5* 8.3* 8.6*  HCT 23.6* 23.7* 24.6*  PLT 57* 67* 90*   BMET  Recent Labs  08/23/16 0552 08/24/16 0300 08/25/16 0422  NA 137 136 134*  K 2.9* 3.1* 3.8  CL 95* 97* 104  CO2 30 29 23   GLUCOSE 96 139* 127*  BUN 11 7 8   CREATININE 0.92 1.00 0.98  CALCIUM 8.7* 8.4* 8.1*   LFT  Recent Labs  08/22/16 1144 08/24/16 0300 08/25/16 0422  PROT 7.7 6.6 6.9  ALBUMIN 3.8 3.2* 3.3*  AST 179* 156* 153*  ALT 42 39 47  ALKPHOS 109 90 86  BILITOT 2.2* 1.8* 1.6*   PT/INR  Recent Labs  08/22/16 1525  LABPROT 15.0  INR 1.18   Hepatitis Panel No results for input(s): HEPBSAG, HCVAB, HEPAIGM, HEPBIGM in the last 72 hours.  Studies/Results: [place] Abdomen Limited Ruq  Result Date: 08/23/2016 CLINICAL DATA:   Alcoholic hepatitis. Elevated AST and total bilirubin. EXAM: US ABDOMEN LIMITED - RIGHT UPPER QUADRANT COMPARISON:  None. FINDINGS: Gallbladder: Tumefactive sludge and small gallstones in the gallbladder. The largest individual stone measures 5 mm in maximum diameter. No gallbladder wall thickening or pericholecystic fluid. The patient was not focally tender over the gallbladder. Common bile duct: Diameter: 4.5 mm Liver: Mildly echogenic.  Probably enlarged. IMPRESSION: 1. Cholelithiasis and sludge in the gallbladder without evidence of cholecystitis. 2. Mildly echogenic and probably enlarged liver. The increased echogenicity could be due to steatosis or chronic hepatitis. 3. No biliary obstruction. Electronically Signed   By: Beckie SaltsSteven  Reid M.D.   On: 08/23/2016 15:43   Scheduled Meds: . folic acid  1 mg Oral Daily  . levothyroxine  200 mcg Oral QAC breakfast  . LORazepam  0-4 mg Intravenous Q12H  . metoprolol tartrate  12.5 mg Oral BID  . multivitamin with minerals  1 tablet Oral Daily  . pantoprazole  40 mg Oral BID  . thiamine  100 mg Oral Daily   Continuous Infusions: . dextrose 5 % and 0.9 % NaCl with KCl 20 mEq/L 75 mL/hr at 08/24/16 0611   PRN Meds:.acetaminophen **OR** acetaminophen, LORazepam **OR** LORazepam, ondansetron **OR** ondansetron (ZOFRAN) IV    ASSESMENT:   *  Minor rectal bleeding  *  Macrocytic anemia.  Low folate, B12 normal. .   *  Thrombocytopenia.  Coags normal.    *  ETOH hepatitis, chronic alcoholism.  Increased liver echogenicity (GB sludge/stones, chronic hepatitis vs steatosis per 10/2015 ultrasound).  Hepatitis panel in probress.   *  Hypokalemia.  Corrected.   *  Hypomagnesia. Corrected.  *  Hx hypothyroidism.  Off replacement for many months,  Synthroid now restarted.  No recent TSH in Epic.    PLAN   *  Timing of colonoscopy?  Check TSH, CBC in AM.  .     Jennye MoccasinSarah Gribbin  08/25/2016, 8:43 AM Pager: [phone]     Attending physician's note    I have taken an interval history, reviewed the chart and examined the patient. I agree with the Advanced Practitioner's note, impression and recommendations. K has corrected. Will schedule colonoscopy for tomorrow.    Claudette HeadMalcolm Jacaria Colburn, MD Clementeen GrahamFACG [phone] Mon-Fri 8a-5p [phone] after 5p, weekends, holidays

## 2016-08-26 NOTE — Transfer of Care (Signed)
Immediate Anesthesia Transfer of Care Note  Patient: Mark BaliJeffrey Meadows  Procedure(s) Performed: Procedure(s): COLONOSCOPY (N/A)  Patient Location: Endoscopy Unit  Anesthesia Type:MAC  Level of Consciousness: awake, alert  and oriented  Airway & Oxygen Therapy: Patient Spontanous Breathing and Patient connected to face mask oxygen  Post-op Assessment: Report given to RN and Post -op Vital signs reviewed and stable  Post vital signs: Reviewed and stable  Last Vitals:  Vitals:   08/26/16 0626 08/26/16 1314  BP: 132/87 (!) 165/108  Pulse: 70 67  Resp: 17 16  Temp: 36.9 C 36.7 C    Last Pain:  Vitals:   08/26/16 1314  TempSrc: Oral  PainSc:          Complications: No apparent anesthesia complications

## 2016-08-26 NOTE — Progress Notes (Addendum)
Pt is ambulating w/ walker to the bathroom, w/ steady gait. Had several pale yellow watery stools this morning. Maint NPO for Endo today. 1300 pt to Endo via wheelchair. 1530 Pt is back from Endo, denies pain.

## 2016-08-26 NOTE — Care Management Note (Signed)
Case Management Note  Patient Details  Name: Mark BaliJeffrey Meadows MRN: [id number] Date of Birth: 02-28-1951  Subjective/Objective:                    Action/Plan:  Patient lives alone with his dog. Wife passed around 10 tens ago.  Discussed discharge planning and PT recommendations regarding SNF short term rehab. Patient refusing states he wants to go directly home.   Discussed home health . Patient politely declined stating he doesn't like people in his home.   Patient is agreeable to rolling walker , not 3 in 1. Walker ordered. Paged MD to make aware.  Patient plans on taking a cab home. Expected Discharge Date:                  Expected Discharge Plan:  Home/Self Care  In-House Referral:     Discharge planning Services  CM Consult  Post Acute Care Choice:  Home Health, Durable Medical Equipment Choice offered to:  Patient  DME Arranged:  Walker rolling DME Agency:  [facility]  [facility]     Status of Service:  Completed, signed off  If discussed at Long Length of Stay Meetings, dates discussed:    Additional Comments:  Kingsley PlanWile, Lorielle Boehning Marie, RN 08/26/2016, 12:06 PM

## 2016-08-26 NOTE — Progress Notes (Addendum)
PROGRESS NOTE  Mark Meadows PYK:[id number] DOB: 1951-06-12 DOA: 08/22/2016 PCP: No PCP Per Patient   Brief Summary:  He was sent to the ED by pmd due toProgressive weakness, lethargy, has been stumbling a lot, significant hypothyroidism, he is Found to have Rectal bleeding with alcoholic hepatitis, anemia, significantly elevated tsh, hypok/mag, very lethargic initially. GI consulted. colonosocopy on 12/5 Improving,  refusing SNF, refusing home health   HPI/Recap of past 24 hours:  Much improved, more alert, oriented x3, denies abdominal pain, no n/v, no sob,  he had bowel prep, is waiting for endoscope procedure he declined my offer to call his family   Assessment/Plan: Principal Problem:   Rectal bleeding Active Problems:   Hypothyroidism   Hypertension   Alcoholism /alcohol abuse ([place])   Alcoholic hepatitis   Hypokalemia   Hyperglycemia   Thrombocytopenia ([place])   Acute blood loss anemia  hematochezia. 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  Hbg/plt 13.6/ 192 on 09/06/2015 No indication for  Blood product transfusion hgb stable above 8, plt seems improving  lft elevation, ast>alt, likely from alcohol, normal alk phos, tbili 2.2,  liver US enlarged liver and increased echogenicity could be from steatosis or chronic hepatitis, Cholelithiasis and sludge in the gallbladder without evidence of Cholecystitis  hepatitis panel negative, hiv screening negative  Alcoholism, on ciwa protocol. Aaox3, no tremors.  Hypokalemia/hypomagnesemia. Replace k/mag. Hold home meds hctz  Severe Hypothyroidism with significant weakness and lethargy initially on presentation  patient has been Noncompliance with meds, TSH level at 92 on 11/30.  Patient was sent to the hospital by PMD due to  progressive weakness. Patient does not have pretibial myxedema, bp stable, no hypothermia, but very lethargic, concerned about gi absorption of synthroid, changed to iv synthroid, s/p stress dose steroids on 12/4,  Patient with significant improvement, fully alert, report feeling a lot better on 12/5, will continue iv synthroids for another 1-2 days, likely able to changed to oral synthroid with continued improvement  Hypertension. Hold norvasc, hold hctz, start low dose lopressor with holding parameters   Noncompliance, education provided  FTT: patient report he has been feeling extremity fatigued with gait insteability and falls . FTT likely from untreated hypothyrodism and alcohol, treat underline disease,  Patient declined SNF placement, will need to maximize home health, case manager aware.    Code Status: DNR, confirmed with the patient  Family Communication: patient , patient declined my offer to call his family  Disposition Plan: pending, need gi clearance for discharge   Consultants:  LBGI  Procedures:  colonoscopy on 12/5  Antibiotics:  none   Objective: BP 132/87 (BP Location: Left Arm)   Pulse 70   Temp 98.5 F (36.9 C) (Oral)   Resp 17   Wt 103.5 kg (228 lb 2.8 oz)   SpO2 99%   Intake/Output Summary (Last 24 hours) at 08/26/16 1158 Last data filed at 08/26/16 1010  Gross per 24 hour  Intake          1013.75 ml  Output              201 ml  Net           812.75 ml   Filed Weights   08/22/16 1652  Weight: 103.5 kg (228 lb 2.8 oz)    Exam:   General:  Much more alert, oriented  x3   Cardiovascular: RRR  Respiratory: CTABL  Abdomen: Soft/ND/NT, positive BS  Musculoskeletal: No Edema  Neuro: no focal deficit, lethargic, oriented x3  Skin: + petechiae, bruises   Data Reviewed: Basic Metabolic Panel:  Recent Labs Lab 08/22/16 1144 08/23/16 0552 08/23/16 0930 08/24/16 0300 08/25/16 0422 08/26/16 0603  NA 135 137  --  136 134* 140   K 2.7* 2.9*  --  3.1* 3.8 3.4*  CL 90* 95*  --  97* 104 109  CO2 29 30  --  29 23 21*  GLUCOSE 142* 96  --  139* 127* 124*  BUN 16 11  --  7 8 7   CREATININE 1.14 0.92  --  1.00 0.98 0.96  CALCIUM 9.0 8.7*  --  8.4* 8.1* 8.4*  MG  --   --  1.5* 1.6* 2.0 1.8   Liver Function Tests:  Recent Labs Lab 08/22/16 1144 08/24/16 0300 08/25/16 0422 08/26/16 0603  AST 179* 156* 153* 400*  ALT 42 39 47 132*  ALKPHOS 109 90 86 126  BILITOT 2.2* 1.8* 1.6* 3.3*  PROT 7.7 6.6 6.9 6.9  ALBUMIN 3.8 3.2* 3.3* 3.2*   No results for input(s): LIPASE, AMYLASE in the last 168 hours. No results for input(s): AMMONIA in the last 168 hours. CBC:  Recent Labs Lab 08/22/16 1144 08/23/16 0552 08/24/16 0300 08/25/16 0422 08/26/16 0603  WBC 5.1 4.9 5.6 6.9 7.6  NEUTROABS  --   --  3.8  --   --   HGB 8.8* 8.5* 8.3* 8.6* 8.0*  HCT 24.5* 23.6* 23.7* 24.6* 22.5*  MCV 110.4* 110.8* 113.4* 113.9* 115.4*  PLT 63* 57* 67* 90* 121*   Cardiac Enzymes:   No results for input(s): CKTOTAL, CKMB, CKMBINDEX, TROPONINI in the last 168 hours. BNP (last 3 results) No results for input(s): BNP in the last 8760 hours.  ProBNP (last 3 results) No results for input(s): PROBNP in the last 8760 hours.  CBG:  Recent Labs Lab 08/25/16 0806 08/25/16 1204 08/25/16 1631 08/25/16 2135 08/26/16 0742  GLUCAP 119* 148* 163* 165* 111*    No results found for this or any previous visit (from the past 240 hour(s)).   Studies: No results found.  Scheduled Meds: . folic acid  1 mg Oral Daily  . levothyroxine  100 mcg Intravenous QAC breakfast  . LORazepam  0-4 mg Intravenous Q12H  . metoprolol tartrate  12.5 mg Oral BID  . multivitamin with minerals  1 tablet Oral Daily  . pantoprazole  40 mg Oral BID  . thiamine  100 mg Oral Daily    Continuous Infusions: . lactated ringers with kcl       Time spent: 25mns  Mark [name] MD, PhD  [facility] Pager [phone] If 7PM-7AM, please contact  night-coverage at [email or web address], password [name]12/01/2016, 11:58 AM  LOS: 4 days

## 2016-08-27 ENCOUNTER — Encounter (HOSPITAL_COMMUNITY): Payer: Self-pay | Admitting: Gastroenterology

## 2016-08-27 DIAGNOSIS — K7010 Alcoholic hepatitis without ascites: Secondary | ICD-10-CM

## 2016-08-27 DIAGNOSIS — I10 Essential (primary) hypertension: Secondary | ICD-10-CM

## 2016-08-27 DIAGNOSIS — E038 Other specified hypothyroidism: Secondary | ICD-10-CM

## 2016-08-27 LAB — CBC
HEMATOCRIT: 22.1 % — AB (ref 39.0–52.0)
Hemoglobin: 7.7 g/dL — ABNORMAL LOW (ref 13.0–17.0)
MCH: 40.5 pg — AB (ref 26.0–34.0)
MCHC: 34.8 g/dL (ref 30.0–36.0)
MCV: 116.3 fL — AB (ref 78.0–100.0)
Platelets: 164 10*3/uL (ref 150–400)
RBC: 1.9 MIL/uL — ABNORMAL LOW (ref 4.22–5.81)
RDW: 16.3 % — AB (ref 11.5–15.5)
WBC: 7.2 10*3/uL (ref 4.0–10.5)

## 2016-08-27 LAB — COMPREHENSIVE METABOLIC PANEL
ALT: 121 U/L — ABNORMAL HIGH (ref 17–63)
AST: 255 U/L — AB (ref 15–41)
Albumin: 3.1 g/dL — ABNORMAL LOW (ref 3.5–5.0)
Alkaline Phosphatase: 128 U/L — ABNORMAL HIGH (ref 38–126)
Anion gap: 9 (ref 5–15)
BILIRUBIN TOTAL: 2.1 mg/dL — AB (ref 0.3–1.2)
BUN: 9 mg/dL (ref 6–20)
CO2: 20 mmol/L — ABNORMAL LOW (ref 22–32)
Calcium: 8.2 mg/dL — ABNORMAL LOW (ref 8.9–10.3)
Chloride: 110 mmol/L (ref 101–111)
Creatinine, Ser: 0.98 mg/dL (ref 0.61–1.24)
Glucose, Bld: 91 mg/dL (ref 65–99)
POTASSIUM: 3.7 mmol/L (ref 3.5–5.1)
Sodium: 139 mmol/L (ref 135–145)
TOTAL PROTEIN: 6.9 g/dL (ref 6.5–8.1)

## 2016-08-27 LAB — LIPASE, BLOOD: LIPASE: 35 U/L (ref 11–51)

## 2016-08-27 LAB — HEMOGLOBIN AND HEMATOCRIT, BLOOD
HCT: 23.8 % — ABNORMAL LOW (ref 39.0–52.0)
Hemoglobin: 8.1 g/dL — ABNORMAL LOW (ref 13.0–17.0)

## 2016-08-27 LAB — AMMONIA: Ammonia: 48 umol/L — ABNORMAL HIGH (ref 9–35)

## 2016-08-27 LAB — GLUCOSE, CAPILLARY
GLUCOSE-CAPILLARY: 107 mg/dL — AB (ref 65–99)
Glucose-Capillary: 102 mg/dL — ABNORMAL HIGH (ref 65–99)
Glucose-Capillary: 101 mg/dL — ABNORMAL HIGH (ref 65–99)
Glucose-Capillary: 96 mg/dL (ref 65–99)

## 2016-08-27 MED ORDER — LEVOTHYROXINE SODIUM 300 MCG PO TABS: 300.0000 ug | ORAL_TABLET | Freq: Every day | ORAL | 0 refills | Status: DC

## 2016-08-27 MED ORDER — THIAMINE HCL 100 MG PO TABS: 100.0000 mg | ORAL_TABLET | Freq: Every day | ORAL | 0 refills | Status: DC

## 2016-08-27 MED ORDER — LEVOTHYROXINE SODIUM 100 MCG PO TABS: 200.0000 ug | ORAL_TABLET | Freq: Every day | ORAL | Status: DC

## 2016-08-27 MED ORDER — PANTOPRAZOLE SODIUM 40 MG PO TBEC: 40.0000 mg | DELAYED_RELEASE_TABLET | Freq: Two times a day (BID) | ORAL | 0 refills | Status: DC

## 2016-08-27 MED ORDER — HYDROCORTISONE ACETATE 25 MG RE SUPP: 25.0000 mg | Freq: Two times a day (BID) | RECTAL | 0 refills | Status: DC

## 2016-08-27 MED ORDER — HYDROCHLOROTHIAZIDE 25 MG PO TABS: 25.0000 mg | ORAL_TABLET | Freq: Every day | ORAL | 0 refills | Status: DC

## 2016-08-27 MED ORDER — FOLIC ACID 1 MG PO TABS: 1.0000 mg | ORAL_TABLET | Freq: Every day | ORAL | 0 refills | Status: DC

## 2016-08-27 MED ORDER — METOPROLOL TARTRATE 25 MG PO TABS: 12.5000 mg | ORAL_TABLET | Freq: Two times a day (BID) | ORAL | 0 refills | Status: DC

## 2016-08-27 MED ORDER — AMLODIPINE BESYLATE 10 MG PO TABS: 10.0000 mg | ORAL_TABLET | Freq: Every day | ORAL | 0 refills | Status: DC

## 2016-08-27 MED ORDER — LEVOTHYROXINE SODIUM 100 MCG PO TABS
300.0000 ug | ORAL_TABLET | Freq: Every day | ORAL | Status: DC
  Administered 2016-08-27 – 2016-08-28 (×2): 300 ug via ORAL
  Filled 2016-08-27 (×2): qty 3

## 2016-08-27 NOTE — Progress Notes (Signed)
PROGRESS NOTE  Mark Meadows YKZ:[id number] DOB: [birth year] DOA: 08/22/2016 PCP: No PCP Per Patient   Brief Summary:  He was sent to the ED by pmd due toProgressive weakness, lethargy, has been stumbling a lot, significant hypothyroidism, he is Found to have Rectal bleeding with alcoholic hepatitis, anemia, significantly elevated tsh, hypok/mag, very lethargic initially. GI consulted. colonosocopy on 12/5 Improving,  refusing SNF, refusing home health   HPI/Recap of past 24 hours:  He reports feeling better, but hemoglobin dropped to 7.7.   Assessment/Plan: Principal Problem:   Rectal bleeding Active Problems:   Hypothyroidism   Hypertension   Alcoholism /alcohol abuse ([place])   Alcoholic hepatitis   Hypokalemia   Hyperglycemia   Thrombocytopenia ([facility])   Acute blood loss anemia   Hematochezia  hematochezia. Vital stable, no abdominal pain, GI consulted, underwent colonoscopy , showing diverticulosis and internal hemorrhoids.   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  Hbg/plt 13.6/ 192 on 09/06/2015 Hemoglobin dropped to 7.7 today. Will watch overnight and if less than 7, will transfuse .   lft elevation, ast>alt, likely from alcohol, normal alk phos, tbili 2.2,  liver US enlarged liver and increased echogenicity could be from steatosis or chronic hepatitis, Cholelithiasis and sludge in the gallbladder without evidence of Cholecystitis. Monitor for improving liver function tests.   hepatitis panel negative, hiv screening negative  Alcoholism, on ciwa protocol. Aaox3, no tremors.  Hypokalemia/hypomagnesemia. Replace k/mag. Hold home meds hctz  Severe Hypothyroidism with significant weakness and lethargy initially on presentation  patient has been Noncompliance with meds, TSH level at 92 on 11/30.  Patient was sent to  the hospital by PMD due to progressive weakness. Patient does not have pretibial myxedema, bp stable, no hypothermia, but very lethargic, concerned about gi absorption of synthroid, was on IV synthroid, changed to po synthroid today,   Hypertension. Better controlled.  start low dose lopressor with holding parameters   Noncompliance, education provided. Will need case manager consult for medication assistance.   FTT: patient report he has been feeling extremity fatigued with gait insteability and falls . FTT likely from untreated hypothyrodism and alcohol, treat underline disease,  Patient declined SNF placement, will need to maximize home health, case manager aware.    Code Status: DNR, confirmed with the patient  Family Communication: none at bedside.   Disposition Plan: pending, need gi clearance for discharge   Consultants:  LBGI  Procedures:  colonoscopy on 12/5  Antibiotics:  none   Objective: BP (!) 138/99 (BP Location: Left Arm)   Pulse 71   Temp 97.9 F (36.6 C) (Oral)   Resp 17   Wt 103.5 kg (228 lb 2.8 oz)   SpO2 98%   Intake/Output Summary (Last 24 hours) at 08/27/16 1656 Last data filed at 08/27/16 1424  Gross per 24 hour  Intake              390 ml  Output                0 ml  Net              390 ml   Filed Weights   08/22/16 1652  Weight: 103.5 kg (228 lb 2.8 oz)    Exam:   General:  Much more alert, oriented x3   Cardiovascular: RRR  Respiratory: CTABL  Abdomen: Soft/ND/NT, positive BS  Musculoskeletal: No Edema  Neuro: no focal deficit, lethargic, oriented x3  Skin: + petechiae, bruises   Data Reviewed: Basic Metabolic Panel:  Recent Labs Lab 08/23/16 0552 08/23/16 0930 08/24/16 0300 08/25/16 0422 08/26/16 0603 08/27/16 0549  NA 137  --  136 134* 140 139  K 2.9*  --  3.1* 3.8 3.4* 3.7  CL 95*  --  97* 104 109 110  CO2 30  --  29 23 21* 20*  GLUCOSE 96  --  139* 127* 124* 91  BUN 11  --  7 8 7 9   CREATININE 0.92   --  1.00 0.98 0.96 0.98  CALCIUM 8.7*  --  8.4* 8.1* 8.4* 8.2*  MG  --  1.5* 1.6* 2.0 1.8  --    Liver Function Tests:  Recent Labs Lab 08/22/16 1144 08/24/16 0300 08/25/16 0422 08/26/16 0603 08/27/16 0549  AST 179* 156* 153* 400* 255*  ALT 42 39 47 132* 121*  ALKPHOS 109 90 86 126 128*  BILITOT 2.2* 1.8* 1.6* 3.3* 2.1*  PROT 7.7 6.6 6.9 6.9 6.9  ALBUMIN 3.8 3.2* 3.3* 3.2* 3.1*    Recent Labs Lab 08/27/16 0549  LIPASE 35    Recent Labs Lab 08/27/16 0549  AMMONIA 48*   CBC:  Recent Labs Lab 08/23/16 0552 08/24/16 0300 08/25/16 0422 08/26/16 0603 08/27/16 0549 08/27/16 1409  WBC 4.9 5.6 6.9 7.6 7.2  --   NEUTROABS  --  3.8  --   --   --   --   HGB 8.5* 8.3* 8.6* 8.0* 7.7* 8.1*  HCT 23.6* 23.7* 24.6* 22.5* 22.1* 23.8*  MCV 110.8* 113.4* 113.9* 115.4* 116.3*  --   PLT 57* 67* 90* 121* 164  --    Cardiac Enzymes:   No results for input(s): CKTOTAL, CKMB, CKMBINDEX, TROPONINI in the last 168 hours. BNP (last 3 results) No results for input(s): BNP in the last 8760 hours.  ProBNP (last 3 results) No results for input(s): PROBNP in the last 8760 hours.  CBG:  Recent Labs Lab 08/26/16 1549 08/26/16 2122 08/27/16 0800 08/27/16 1258 08/27/16 1631  GLUCAP 103* 113* 102* 107* 101*    No results found for this or any previous visit (from the past 240 hour(s)).   Studies: No results found.  Scheduled Meds: . folic acid  1 mg Oral Daily  . hydrocortisone  25 mg Rectal BID  . levothyroxine  300 mcg Oral QAC breakfast  . magnesium oxide  400 mg Oral Daily  . metoprolol tartrate  12.5 mg Oral BID  . multivitamin with minerals  1 tablet Oral Daily  . pantoprazole  40 mg Oral BID  . thiamine  100 mg Oral Daily    Continuous Infusions: . lactated ringers with kcl 75 mL/hr at 08/27/16 4034     Time spent: 36mns  Mark Kendzierski MD,   [facility] Pager [phone] If 7PM-7AM, please contact night-coverage at [email or web address], password  [facility]12/02/2016, 4:56 PM  LOS: 5 days

## 2016-08-27 NOTE — Anesthesia Postprocedure Evaluation (Signed)
Anesthesia Post Note  Patient: Mark Meadows  Procedure(s) Performed: Procedure(s) (LRB): COLONOSCOPY (N/A)  Patient location during evaluation: PACU Anesthesia Type: MAC Level of consciousness: awake and alert Pain management: pain level controlled Vital Signs Assessment: post-procedure vital signs reviewed and stable Respiratory status: spontaneous breathing, nonlabored ventilation, respiratory function stable and patient connected to nasal cannula oxygen Cardiovascular status: stable and blood pressure returned to baseline Anesthetic complications: no    Last Vitals:  Vitals:   08/26/16 2120 08/27/16 0554  BP: (!) 152/94 124/89  Pulse: 95 76  Resp: 18 17  Temp: 36.9 C 37.6 C    Last Pain:  Vitals:   08/26/16 2120  TempSrc: Oral  PainSc:                  Kennieth RadFitzgerald, Birdie Fetty E

## 2016-08-28 LAB — HEMOGLOBIN AND HEMATOCRIT, BLOOD
HEMATOCRIT: 24.7 % — AB (ref 39.0–52.0)
Hemoglobin: 8.5 g/dL — ABNORMAL LOW (ref 13.0–17.0)

## 2016-08-28 LAB — CBC
HCT: 23.8 % — ABNORMAL LOW (ref 39.0–52.0)
HEMOGLOBIN: 8.3 g/dL — AB (ref 13.0–17.0)
MCH: 40.7 pg — AB (ref 26.0–34.0)
MCHC: 34.9 g/dL (ref 30.0–36.0)
MCV: 116.7 fL — AB (ref 78.0–100.0)
PLATELETS: 209 10*3/uL (ref 150–400)
RBC: 2.04 MIL/uL — AB (ref 4.22–5.81)
RDW: 16.1 % — ABNORMAL HIGH (ref 11.5–15.5)
WBC: 7.2 10*3/uL (ref 4.0–10.5)

## 2016-08-28 LAB — GLUCOSE, CAPILLARY
Glucose-Capillary: 88 mg/dL (ref 65–99)
Glucose-Capillary: 129 mg/dL — ABNORMAL HIGH (ref 65–99)

## 2016-08-28 LAB — TSH: TSH: 62.857 u[IU]/mL — ABNORMAL HIGH (ref 0.350–4.500)

## 2016-08-28 NOTE — Progress Notes (Addendum)
D/C papers gone over with pt. IV already out. Personal belongings given to pt. No questions/complaints. Taxi called and pt. Waiting for pick-up.  Pt. D/c'd successfully via cab.

## 2016-08-28 NOTE — Progress Notes (Signed)
Physical Therapy Treatment Patient Details Name: Mark BaliJeffrey Meadows MRN: [id number] DOB: 1951-06-26 Today's Date: 08/28/2016    History of Present Illness Pt admit with rectal bleeding.  Hypothyroidism and alcoholism as well.      PT Comments    Pt able to navigate stairs with min assist with 1 LOB. Pt was able to catch himself using rails. Pt demonstrated ability to carry RW while ascending/descending stairs, but required several verbal cues for foot and walker placement. Pt also has difficulty remembering verbal cues for hand placement with transfers. Pt would benefit from SNF to continue to progress mobility and strength before return to home, but is declining at this time as he would like to go home. PT will continue to follow acutely to progress DME education and strategies for home management.    Follow Up Recommendations  SNF (Declines SNF and HH therapies. Recommend short SNF stay)     Equipment Recommendations  Rolling walker with 5" wheels    Recommendations for Other Services       Precautions / Restrictions Precautions Precautions: Fall Precaution Comments: Pt reports he started falling in Oct 2016 and broke ribs.  In March 2017, fell 4 x and broke ribs again.  Has had other falls since then not resulting in injuries.  Restrictions Weight Bearing Restrictions: No    Mobility  Bed Mobility Overal bed mobility: Independent                Transfers Overall transfer level: Needs assistance Equipment used: Rolling walker (2 wheeled) Transfers: Sit to/from Stand Sit to Stand: Supervision         General transfer comment: pt requires frequent verbal cues for hand placement as he did not demonstrate ability to remember independently with sit<>stand from bed x2 and toilet x2  Ambulation/Gait Ambulation/Gait assistance: Min guard Ambulation Distance (Feet): 100 Feet Assistive device: Rolling walker (2 wheeled) Gait Pattern/deviations: Step-through pattern;Decreased  stride length;Trunk flexed;Wide base of support;Shuffle   Gait velocity interpretation: Below normal speed for age/gender General Gait Details: Verbal cues to stay close to RW. Slow, steady gait but increases shuffle with increased fatigue.    Stairs Stairs: Yes   Stair Management: One rail Right;Step to pattern;Forwards;Sideways (ascending step to and forwards; descending sideways) Number of Stairs: 5 (x2) General stair comments: Pt educated on step to strategy, side step strategy, and demonstrated ability to ascend/descend stairs while carrying RW  Wheelchair Mobility    Modified Rankin (Stroke Patients Only)       Balance Overall balance assessment: Needs assistance;History of Falls Sitting-balance support: No upper extremity supported;Feet supported Sitting balance-Leahy Scale: Good     Standing balance support: Bilateral upper extremity supported;During functional activity Standing balance-Leahy Scale: Poor Standing balance comment: relies on bil UE support for balance.                     Cognition Arousal/Alertness: Awake/alert Behavior During Therapy: WFL for tasks assessed/performed Overall Cognitive Status: Within Functional Limits for tasks assessed                      Exercises      General Comments        Pertinent Vitals/Pain Pain Assessment: No/denies pain    Home Living                      Prior Function            PT Goals (current goals  can now be found in the care plan section) Acute Rehab PT Goals Patient Stated Goal: to go home Progress towards PT goals: Progressing toward goals    Frequency    Min 3X/week      PT Plan Current plan remains appropriate    Co-evaluation             End of Session Equipment Utilized During Treatment: Gait belt Activity Tolerance: Patient limited by fatigue Patient left: with call bell/phone within reach;in bed;with bed alarm set     Time: [id number] PT Time  Calculation (min) (ACUTE ONLY): 28 min  Charges:  $Gait Training: 8-22 mins $Therapeutic Activity: 8-22 mins                    G Codes:      Mark PollackRebecca Soma Meadows 08/28/2016, 10:36 AM  Mark Pollackebecca Angla Meadows, SPT [phone]

## 2016-08-28 NOTE — Care Management Note (Signed)
Case Management Note  Patient Details  Name: Mark Meadows MRN: [id number] Date of Birth: 28-Oct-1950  Subjective/Delena BaliObjective:                    Action/Plan:  Consult for medication assistance . Spoke with patient he has a PCP Dr Cyndia BentBadger and patient states he can afford his medication and does not need any assistance. Expected Discharge Date:                  Expected Discharge Plan:  Home/Self Care  In-House Referral:     Discharge planning Services  CM Consult  Post Acute Care Choice:  Home Health, Durable Medical Equipment Choice offered to:  Patient  DME Arranged:  Walker rolling DME Agency:  [facility]  [facility]  [facility]     Status of Service:  Completed, signed off  If discussed at Long Length of Stay Meetings, dates discussed:    Additional Comments:  Kingsley PlanWile, Irby Fails Marie, RN 08/28/2016, 10:45 AM

## 2016-08-28 NOTE — Progress Notes (Signed)
Spoke to MD about pt.'s bloody Bm earlier this AM and still smearing some blood on chuck pad. New orders placed.

## 2016-08-29 NOTE — Discharge Summary (Signed)
Physician Discharge Summary  Yoav Okane YDX:[id number] DOB: 1951/02/02 DOA: 08/22/2016  PCP: No PCP Per Patient  Admit date: 08/22/2016 Discharge date: 12/72017  Admitted From: Home.  Disposition:  Home.   Recommendations for Outpatient Follow-up:  1. Follow up with PCP in 1-2 weeks 2. Please obtain BMP/CBC in one week 3. Please follow up with liver function tests ino ne week.  4. Please see your gastroenterology if you have recurrent bleeding.   Home Health:pt refused.   Discharge Condition:guarded.  CODE STATUS: DNR Diet recommendation: regular diet.   Brief/Interim Summary: He was sent to the ED by pmd due toProgressive weakness, lethargy, has been stumbling a lot, significant hypothyroidism, he is Found to have Rectal bleeding with alcoholic hepatitis, anemia, significantly elevated tsh, hypok/mag, very lethargic initially. GI consulted. colonosocopy on 12/5, shows internal hemorrhoids and diverticulosis. Hemoglobin stable, some blood perrectum probably from hemorrhoids. Physical therapy refusing SNF, refusing home health  Discharge Diagnoses:  Principal Problem:   Rectal bleeding Active Problems:   Hypothyroidism   Hypertension   Alcoholism /alcohol abuse ([place])   Alcoholic hepatitis   Hypokalemia   Hyperglycemia   Thrombocytopenia (HCC)   Acute blood loss anemia   Hematochezia  hematochezia. Vital stable, no abdominal pain, GI consulted, underwent colonoscopy , showing diverticulosis and internal hemorrhoids. HEMOGLOBIN STABLE. Occasional blood per rectum , but repeat hemoglobin values have been stable and he wanted to go home. Recommended to follow up with gi if he has recurrent bleeding.   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  Hbg/plt 13.6/ 192 on 09/06/2015 Hemoglobin stable around 8.   lft elevation,  probably from alcohol induced hepatitis.  ast>alt, likely from alcohol, normal alk phos, tbili 2.2,  liver US enlarged liver and increased echogenicity could be from steatosis or chronic hepatitis, Cholelithiasis and sludge in the gallbladder without evidence of Cholecystitis. Monitor for improving liver function tests.   hepatitis panel negative, hiv screening negative  Alcoholism, on ciwa protocol. Aaox3, no tremors.   Hypokalemia/hypomagnesemia. Replaced k/mag.   Severe Hypothyroidism with significant weakness and lethargy initially on presentation  patient has been Noncompliance with meds, TSH level at 92 on 11/30.  Patient was sent to the hospital by PMD due to progressive weakness. It has improved to 64 ond ischarge. Recommend checking a repeat TSH in one week at PCP office.  Patient does not have pretibial myxedema, bp stable, no hypothermia, . He reports his symptoms of lethargy and fatigue have improved.   Hypertension. Better controlled.    Noncompliance, education provided. Will need case manager consult for medication assistance.   FTT: patient report he has been feeling extremity fatigued with gait insteability and falls . FTT likely from untreated hypothyrodism and alcohol, treat underline disease,  Patient declined SNF placement, will need to maximize home health, he continues to refuse home health. case manager aware.   Discharge Instructions  Discharge Instructions    Call MD for:  extreme fatigue    Complete by:  As directed    Call MD for:  persistant dizziness or light-headedness    Complete by:  As directed    Call MD for:  persistant nausea and vomiting    Complete by:  As directed    Call MD for:  severe uncontrolled pain    Complete by:  As directed    Diet - low sodium heart healthy    Complete  by:  As directed    Diet - low sodium heart healthy    Complete by:  As directed    Discharge instructions    Complete by:  As directed    Please follow up  with PCP in one week.   Discharge instructions    Complete by:  As directed    Please follow up with GI as needed.  Please check CBC and repeat TSH on Monday at PCP office.  Please follow up with PCP in one week.   Face-to-face encounter (required for Medicare/Medicaid patients)    Complete by:  As directed    I Xu,Fang certify that this patient is under my care and that I, or a nurse practitioner or physician's assistant working with me, had a face-to-face encounter that meets the physician face-to-face encounter requirements with this patient on 08/26/2016. The encounter with the patient was in whole, or in part for the following medical condition(s) which is the primary reason for home health care (List medical condition): FTT   The encounter with the patient was in whole, or in part, for the following medical condition, which is the primary reason for home health care:  FTT   I certify that, based on my findings, the following services are medically necessary home health services:   Nursing Physical therapy     Reason for Medically Necessary Home Health Services:  Skilled Nursing- Change/Decline in Patient Status   My clinical findings support the need for the above services:  Unsafe ambulation due to balance issues   Further, I certify that my clinical findings support that this patient is homebound due to:  Unsafe ambulation due to balance issues   Home Health    Complete by:  As directed    To provide the following care/treatments:   PT RN Social work         Medication List    STOP taking these medications   ibuprofen 200 MG tablet Commonly known as:  ADVIL,MOTRIN     TAKE these medications   amLODipine 10 MG tablet Commonly known as:  NORVASC Take 1 tablet (10 mg total) by mouth daily.   folic acid 1 MG tablet Commonly known as:  FOLVITE Take 1 tablet (1 mg total) by mouth daily.   hydrochlorothiazide 25 MG tablet Commonly known as:  HYDRODIURIL Take 1 tablet (25 mg  total) by mouth daily.   hydrocortisone 25 MG suppository Commonly known as:  ANUSOL-HC Place 1 suppository (25 mg total) rectally 2 (two) times daily.   levothyroxine 300 MCG tablet Commonly known as:  SYNTHROID, LEVOTHROID Take 1 tablet (300 mcg total) by mouth daily before breakfast.   metoprolol tartrate 25 MG tablet Commonly known as:  LOPRESSOR Take 0.5 tablets (12.5 mg total) by mouth 2 (two) times daily. What changed:  medication strength  how much to take   pantoprazole 40 MG tablet Commonly known as:  PROTONIX Take 1 tablet (40 mg total) by mouth 2 (two) times daily.   thiamine 100 MG tablet Take 1 tablet (100 mg total) by mouth daily.      Follow-up Information    Scarlette Shorts, MD Follow up in 1 month(s).   Specialty:  Gastroenterology Why:  GI bleed, abnormal liver function Contact information: [address] [place] [address] [phone]          No Known Allergies  Consultations: Gastroenterology.   Procedures/Studies: US Abdomen Limited Ruq  Result Date: 08/23/2016 CLINICAL DATA:  Alcoholic hepatitis.  Elevated AST and total bilirubin. EXAM: US ABDOMEN LIMITED - RIGHT UPPER QUADRANT COMPARISON:  None. FINDINGS: Gallbladder: Tumefactive sludge and small gallstones in the gallbladder. The largest individual stone measures 5 mm in maximum diameter. No gallbladder wall thickening or pericholecystic fluid. The patient was not focally tender over the gallbladder. Common bile duct: Diameter: 4.5 mm Liver: Mildly echogenic.  Probably enlarged. IMPRESSION: 1. Cholelithiasis and sludge in the gallbladder without evidence of cholecystitis. 2. Mildly echogenic and probably enlarged liver. The increased echogenicity could be due to steatosis or chronic hepatitis. 3. No biliary obstruction. Electronically Signed   By: Claudie Revering M.D.   On: 08/23/2016 15:43      Subjective: No new complaints. No nausea, vomiting or abdominal pain.   Discharge  Exam: Vitals:   08/28/16 0202 08/28/16 0535  BP: (!) 144/89 (!) 146/96  Pulse: 74 78  Resp: 18 18  Temp: 98.2 F (36.8 C) 98.2 F (36.8 C)   Vitals:   08/27/16 1301 08/27/16 2121 08/28/16 0202 08/28/16 0535  BP: (!) 138/99 (!) 100/53 (!) 144/89 (!) 146/96  Pulse: 71 73 74 78  Resp:  18 18 18   Temp: 97.9 F (36.6 C) 99 F (37.2 C) 98.2 F (36.8 C) 98.2 F (36.8 C)  TempSrc: Oral Oral Oral Oral  SpO2: 98% 95% 96% 98%  Weight:        General: Pt is alert, awake, not in acute distress Cardiovascular: RRR, S1/S2 +, no rubs, no gallops Respiratory: CTA bilaterally, no wheezing, no rhonchi Abdominal: Soft, NT, ND, bowel sounds + Extremities: no edema, no cyanosis    The results of significant diagnostics from this hospitalization (including imaging, microbiology, ancillary and laboratory) are listed below for reference.     Microbiology: No results found for this or any previous visit (from the past 240 hour(s)).   Labs: BNP (last 3 results) No results for input(s): BNP in the last 8760 hours. Basic Metabolic Panel:  Recent Labs Lab 08/23/16 0552 08/23/16 0930 08/24/16 0300 08/25/16 0422 08/26/16 0603 08/27/16 0549  NA 137  --  136 134* 140 139  K 2.9*  --  3.1* 3.8 3.4* 3.7  CL 95*  --  97* 104 109 110  CO2 30  --  29 23 21* 20*  GLUCOSE 96  --  139* 127* 124* 91  BUN 11  --  7 8 7 9   CREATININE 0.92  --  1.00 0.98 0.96 0.98  CALCIUM 8.7*  --  8.4* 8.1* 8.4* 8.2*  MG  --  1.5* 1.6* 2.0 1.8  --    Liver Function Tests:  Recent Labs Lab 08/22/16 1144 08/24/16 0300 08/25/16 0422 08/26/16 0603 08/27/16 0549  AST 179* 156* 153* 400* 255*  ALT 42 39 47 132* 121*  ALKPHOS 109 90 86 126 128*  BILITOT 2.2* 1.8* 1.6* 3.3* 2.1*  PROT 7.7 6.6 6.9 6.9 6.9  ALBUMIN 3.8 3.2* 3.3* 3.2* 3.1*    Recent Labs Lab 08/27/16 0549  LIPASE 35    Recent Labs Lab 08/27/16 0549  AMMONIA 48*   CBC:  Recent Labs Lab 08/24/16 0300 08/25/16 0422 08/26/16 0603  08/27/16 0549 08/27/16 1409 08/28/16 0205 08/28/16 1347  WBC 5.6 6.9 7.6 7.2  --  7.2  --   NEUTROABS 3.8  --   --   --   --   --   --   HGB 8.3* 8.6* 8.0* 7.7* 8.1* 8.3* 8.5*  HCT 23.7* 24.6* 22.5* 22.1* 23.8* 23.8* 24.7*  MCV 113.4* 113.9* 115.4* 116.3*  --  116.7*  --   PLT 67* 90* 121* 164  --  209  --    Cardiac Enzymes: No results for input(s): CKTOTAL, CKMB, CKMBINDEX, TROPONINI in the last 168 hours. BNP: Invalid input(s): POCBNP CBG:  Recent Labs Lab 08/27/16 1258 08/27/16 1631 08/27/16 2120 08/28/16 0743 08/28/16 1209  GLUCAP 107* 101* 96 88 129*   D-Dimer No results for input(s): DDIMER in the last 72 hours. Hgb A1c No results for input(s): HGBA1C in the last 72 hours. Lipid Profile No results for input(s): CHOL, HDL, LDLCALC, TRIG, CHOLHDL, LDLDIRECT in the last 72 hours. Thyroid function studies  Recent Labs  08/28/16 0001  TSH 62.857*   Anemia work up No results for input(s): VITAMINB12, FOLATE, FERRITIN, TIBC, IRON, RETICCTPCT in the last 72 hours. Urinalysis    Component Value Date/Time   COLORURINE AMBER (A) 08/22/2016 [place] 08/22/2016 1544   LABSPEC 1.020 08/22/2016 1544   PHURINE 6.0 08/22/2016 1544   GLUCOSEU NEGATIVE 08/22/2016 1544   HGBUR SMALL (A) 08/22/2016 1544   BILIRUBINUR MODERATE (A) 08/22/2016 1544   KETONESUR 15 (A) 08/22/2016 1544   PROTEINUR 100 (A) 08/22/2016 1544   NITRITE POSITIVE (A) 08/22/2016 1544   LEUKOCYTESUR NEGATIVE 08/22/2016 1544   Sepsis Labs Invalid input(s): PROCALCITONIN,  WBC,  LACTICIDVEN Microbiology No results found for this or any previous visit (from the past 240 hour(s)).   Time coordinating discharge: Over 30 minutes  SIGNED:   Hosie Poisson, MD  [facility] 08/29/2016, 9:39 AM Pager   If 7PM-7AM, please contact night-coverage [email or web address] Password [email or web address]

## 2016-09-02 ENCOUNTER — Emergency Department (HOSPITAL_COMMUNITY): Payer: Medicare Other

## 2016-09-02 ENCOUNTER — Encounter (HOSPITAL_COMMUNITY): Payer: Self-pay

## 2016-09-02 ENCOUNTER — Inpatient Hospital Stay (HOSPITAL_COMMUNITY)
Admission: EM | Admit: 2016-09-02 | Discharge: 2016-09-05 | DRG: 897 | Disposition: A | Discharge status: Skilled Nursing Facility | Payer: Medicare Other | Attending: Internal Medicine | Admitting: Internal Medicine

## 2016-09-02 DIAGNOSIS — I248 Other forms of acute ischemic heart disease: Secondary | ICD-10-CM | POA: Diagnosis present

## 2016-09-02 DIAGNOSIS — M25462 Effusion, left knee: Secondary | ICD-10-CM | POA: Diagnosis present

## 2016-09-02 DIAGNOSIS — D649 Anemia, unspecified: Secondary | ICD-10-CM | POA: Diagnosis present

## 2016-09-02 DIAGNOSIS — K7010 Alcoholic hepatitis without ascites: Secondary | ICD-10-CM | POA: Diagnosis present

## 2016-09-02 DIAGNOSIS — E039 Hypothyroidism, unspecified: Secondary | ICD-10-CM | POA: Diagnosis present

## 2016-09-02 DIAGNOSIS — E860 Dehydration: Secondary | ICD-10-CM | POA: Diagnosis present

## 2016-09-02 DIAGNOSIS — E538 Deficiency of other specified B group vitamins: Secondary | ICD-10-CM | POA: Diagnosis present

## 2016-09-02 DIAGNOSIS — K219 Gastro-esophageal reflux disease without esophagitis: Secondary | ICD-10-CM | POA: Diagnosis present

## 2016-09-02 DIAGNOSIS — Z9114 Patient's other noncompliance with medication regimen: Secondary | ICD-10-CM

## 2016-09-02 DIAGNOSIS — R339 Retention of urine, unspecified: Secondary | ICD-10-CM | POA: Diagnosis present

## 2016-09-02 DIAGNOSIS — R531 Weakness: Secondary | ICD-10-CM | POA: Diagnosis present

## 2016-09-02 DIAGNOSIS — R0602 Shortness of breath: Secondary | ICD-10-CM

## 2016-09-02 DIAGNOSIS — E876 Hypokalemia: Secondary | ICD-10-CM | POA: Diagnosis present

## 2016-09-02 DIAGNOSIS — Z87891 Personal history of nicotine dependence: Secondary | ICD-10-CM | POA: Diagnosis not present

## 2016-09-02 DIAGNOSIS — Z7982 Long term (current) use of aspirin: Secondary | ICD-10-CM

## 2016-09-02 DIAGNOSIS — I10 Essential (primary) hypertension: Secondary | ICD-10-CM | POA: Diagnosis present

## 2016-09-02 DIAGNOSIS — K709 Alcoholic liver disease, unspecified: Secondary | ICD-10-CM | POA: Diagnosis present

## 2016-09-02 DIAGNOSIS — Z66 Do not resuscitate: Secondary | ICD-10-CM | POA: Diagnosis present

## 2016-09-02 DIAGNOSIS — F1020 Alcohol dependence, uncomplicated: Secondary | ICD-10-CM | POA: Diagnosis not present

## 2016-09-02 DIAGNOSIS — R739 Hyperglycemia, unspecified: Secondary | ICD-10-CM | POA: Diagnosis present

## 2016-09-02 DIAGNOSIS — R9431 Abnormal electrocardiogram [ECG] [EKG]: Secondary | ICD-10-CM | POA: Diagnosis present

## 2016-09-02 DIAGNOSIS — R627 Adult failure to thrive: Secondary | ICD-10-CM | POA: Diagnosis present

## 2016-09-02 DIAGNOSIS — E46 Unspecified protein-calorie malnutrition: Secondary | ICD-10-CM | POA: Diagnosis present

## 2016-09-02 DIAGNOSIS — M6282 Rhabdomyolysis: Secondary | ICD-10-CM | POA: Diagnosis present

## 2016-09-02 DIAGNOSIS — Z6831 Body mass index (BMI) 31.0-31.9, adult: Secondary | ICD-10-CM

## 2016-09-02 DIAGNOSIS — I4891 Unspecified atrial fibrillation: Secondary | ICD-10-CM | POA: Diagnosis present

## 2016-09-02 HISTORY — DX: Alcoholic hepatitis without ascites: K70.10

## 2016-09-02 HISTORY — DX: Thrombocytopenia, unspecified: D69.6

## 2016-09-02 HISTORY — DX: Cardiac arrhythmia, unspecified: I49.9

## 2016-09-02 HISTORY — DX: Acute posthemorrhagic anemia: D62

## 2016-09-02 HISTORY — DX: Hypothyroidism, unspecified: E03.9

## 2016-09-02 HISTORY — DX: Alcohol dependence, uncomplicated: F10.20

## 2016-09-02 HISTORY — DX: Hyperglycemia, unspecified: R73.9

## 2016-09-02 HISTORY — DX: Gastro-esophageal reflux disease without esophagitis: K21.9

## 2016-09-02 HISTORY — DX: Gout, unspecified: M10.9

## 2016-09-02 HISTORY — DX: Hypokalemia: E87.6

## 2016-09-02 HISTORY — DX: Melena: K92.1

## 2016-09-02 LAB — CBC WITH DIFFERENTIAL/PLATELET
BASOS ABS: 0 10*3/uL (ref 0.0–0.1)
BASOS PCT: 0 %
EOS ABS: 0 10*3/uL (ref 0.0–0.7)
Eosinophils Relative: 0 %
HEMATOCRIT: 27.6 % — AB (ref 39.0–52.0)
HEMOGLOBIN: 9.4 g/dL — AB (ref 13.0–17.0)
LYMPHS PCT: 7 %
Lymphs Abs: 1.1 10*3/uL (ref 0.7–4.0)
MCH: 39.7 pg — AB (ref 26.0–34.0)
MCHC: 34.1 g/dL (ref 30.0–36.0)
MCV: 116.5 fL — ABNORMAL HIGH (ref 78.0–100.0)
Monocytes Absolute: 1.6 10*3/uL — ABNORMAL HIGH (ref 0.1–1.0)
Monocytes Relative: 10 %
NEUTROS ABS: 12.9 10*3/uL — AB (ref 1.7–7.7)
NEUTROS PCT: 83 %
Platelets: 274 10*3/uL (ref 150–400)
RBC: 2.37 MIL/uL — ABNORMAL LOW (ref 4.22–5.81)
RDW: 15.7 % — ABNORMAL HIGH (ref 11.5–15.5)
WBC: 15.6 10*3/uL — ABNORMAL HIGH (ref 4.0–10.5)

## 2016-09-02 LAB — TYPE AND SCREEN
ABO/RH(D): O POS
ANTIBODY SCREEN: NEGATIVE

## 2016-09-02 LAB — CK: Total CK: 1182 U/L — ABNORMAL HIGH (ref 49–397)

## 2016-09-02 LAB — COMPREHENSIVE METABOLIC PANEL
ALBUMIN: 3.3 g/dL — AB (ref 3.5–5.0)
ALK PHOS: 126 U/L (ref 38–126)
ALT: 69 U/L — ABNORMAL HIGH (ref 17–63)
ANION GAP: 8 (ref 5–15)
AST: 104 U/L — ABNORMAL HIGH (ref 15–41)
BUN: 14 mg/dL (ref 6–20)
CALCIUM: 9.2 mg/dL (ref 8.9–10.3)
CO2: 27 mmol/L (ref 22–32)
Chloride: 103 mmol/L (ref 101–111)
Creatinine, Ser: 0.9 mg/dL (ref 0.61–1.24)
GFR calc non Af Amer: >60 mL/min (ref >60)
GLUCOSE: 141 mg/dL — AB (ref 65–99)
POTASSIUM: 3.2 mmol/L — AB (ref 3.5–5.1)
SODIUM: 138 mmol/L (ref 135–145)
Total Bilirubin: 2.8 mg/dL — ABNORMAL HIGH (ref 0.3–1.2)
Total Protein: 8.3 g/dL — ABNORMAL HIGH (ref 6.5–8.1)

## 2016-09-02 LAB — GLUCOSE, CAPILLARY: Glucose-Capillary: 131 mg/dL — ABNORMAL HIGH (ref 65–99)

## 2016-09-02 LAB — ETHANOL: Alcohol, Ethyl (B): <5 mg/dL (ref <5)

## 2016-09-02 LAB — AMMONIA: AMMONIA: 14 umol/L (ref 9–35)

## 2016-09-02 LAB — LIPASE, BLOOD: LIPASE: 18 U/L (ref 11–51)

## 2016-09-02 LAB — TSH: TSH: 32.635 u[IU]/mL — ABNORMAL HIGH (ref 0.350–4.500)

## 2016-09-02 LAB — PROTIME-INR
INR: 1.04
PROTHROMBIN TIME: 13.6 s (ref 11.4–15.2)

## 2016-09-02 LAB — MAGNESIUM: Magnesium: 1.8 mg/dL (ref 1.7–2.4)

## 2016-09-02 LAB — TROPONIN I: Troponin I: 0.04 ng/mL (ref <0.03)

## 2016-09-02 MED ORDER — METOPROLOL TARTRATE 5 MG/5ML IV SOLN: 2.5000 mg | INTRAVENOUS | Status: AC

## 2016-09-02 MED ORDER — SODIUM CHLORIDE 0.9 % IV BOLUS (SEPSIS)
1000.0000 mL | Freq: Once | INTRAVENOUS | Status: AC
  Administered 2016-09-02: 1000 mL via INTRAVENOUS

## 2016-09-02 MED ORDER — ADULT MULTIVITAMIN W/MINERALS CH
1.0000 | ORAL_TABLET | Freq: Every day | ORAL | Status: DC
  Administered 2016-09-02 – 2016-09-05 (×4): 1 via ORAL
  Filled 2016-09-02 (×4): qty 1

## 2016-09-02 MED ORDER — PANTOPRAZOLE SODIUM 40 MG PO TBEC
40.0000 mg | DELAYED_RELEASE_TABLET | Freq: Two times a day (BID) | ORAL | Status: DC
  Administered 2016-09-02: 40 mg via ORAL
  Filled 2016-09-02: qty 1

## 2016-09-02 MED ORDER — SODIUM CHLORIDE 0.9 % IV SOLN
250.0000 mL | INTRAVENOUS | Status: DC | PRN
Start: 2016-09-02 — End: 2016-09-05

## 2016-09-02 MED ORDER — VITAMIN B-1 100 MG PO TABS
100.0000 mg | ORAL_TABLET | Freq: Every day | ORAL | Status: DC
  Administered 2016-09-02 – 2016-09-05 (×4): 100 mg via ORAL
  Filled 2016-09-02 (×4): qty 1

## 2016-09-02 MED ORDER — INSULIN ASPART 100 UNIT/ML ~~LOC~~ SOLN
0.0000 [IU] | Freq: Three times a day (TID) | SUBCUTANEOUS | Status: DC
  Administered 2016-09-03: 1 [IU] via SUBCUTANEOUS

## 2016-09-02 MED ORDER — METOPROLOL TARTRATE 12.5 MG HALF TABLET
12.5000 mg | ORAL_TABLET | Freq: Two times a day (BID) | ORAL | Status: DC
  Administered 2016-09-02 – 2016-09-05 (×6): 12.5 mg via ORAL
  Filled 2016-09-02 (×6): qty 1

## 2016-09-02 MED ORDER — METOPROLOL TARTRATE 12.5 MG HALF TABLET: 12.5000 mg | ORAL_TABLET | Freq: Two times a day (BID) | ORAL | Status: DC

## 2016-09-02 MED ORDER — SODIUM CHLORIDE 0.9% FLUSH: 3.0000 mL | INTRAVENOUS | Status: DC | PRN

## 2016-09-02 MED ORDER — SODIUM CHLORIDE 0.9 % IV SOLN: INTRAVENOUS | Status: DC

## 2016-09-02 MED ORDER — ACETAMINOPHEN 325 MG PO TABS
650.0000 mg | ORAL_TABLET | ORAL | Status: DC | PRN
  Administered 2016-09-04 – 2016-09-05 (×2): 650 mg via ORAL
  Filled 2016-09-02 (×2): qty 2

## 2016-09-02 MED ORDER — HYDROCORTISONE ACETATE 25 MG RE SUPP: 25.0000 mg | Freq: Two times a day (BID) | RECTAL | Status: DC

## 2016-09-02 MED ORDER — FOLIC ACID 1 MG PO TABS: 1.0000 mg | ORAL_TABLET | Freq: Every day | ORAL | Status: DC

## 2016-09-02 MED ORDER — SODIUM CHLORIDE 0.9% FLUSH
3.0000 mL | Freq: Two times a day (BID) | INTRAVENOUS | Status: DC
  Administered 2016-09-02: 3 mL via INTRAVENOUS

## 2016-09-02 MED ORDER — LEVOTHYROXINE SODIUM 100 MCG PO TABS: 300.0000 ug | ORAL_TABLET | Freq: Every day | ORAL | Status: DC

## 2016-09-02 MED ORDER — FOLIC ACID 1 MG PO TABS
1.0000 mg | ORAL_TABLET | Freq: Every day | ORAL | Status: DC
  Administered 2016-09-02 – 2016-09-05 (×4): 1 mg via ORAL
  Filled 2016-09-02 (×4): qty 1

## 2016-09-02 MED ORDER — LEVOTHYROXINE SODIUM 100 MCG PO TABS
300.0000 ug | ORAL_TABLET | Freq: Every day | ORAL | Status: DC
  Administered 2016-09-03 – 2016-09-05 (×3): 300 ug via ORAL
  Filled 2016-09-02 (×3): qty 3

## 2016-09-02 MED ORDER — ONDANSETRON HCL 4 MG/2ML IJ SOLN: 4.0000 mg | Freq: Four times a day (QID) | INTRAMUSCULAR | Status: DC | PRN

## 2016-09-02 MED ORDER — LORAZEPAM 1 MG PO TABS: 1.0000 mg | ORAL_TABLET | Freq: Four times a day (QID) | ORAL | Status: DC | PRN

## 2016-09-02 MED ORDER — LORAZEPAM 2 MG/ML IJ SOLN
1.0000 mg | Freq: Four times a day (QID) | INTRAMUSCULAR | Status: DC | PRN
  Administered 2016-09-03 – 2016-09-04 (×2): 1 mg via INTRAVENOUS
  Filled 2016-09-02 (×2): qty 1

## 2016-09-02 MED ORDER — THIAMINE HCL 100 MG PO TABS: 100.0000 mg | ORAL_TABLET | Freq: Every day | ORAL | Status: DC

## 2016-09-02 MED ORDER — PANTOPRAZOLE SODIUM 40 MG PO TBEC: 40.0000 mg | DELAYED_RELEASE_TABLET | Freq: Two times a day (BID) | ORAL | Status: DC

## 2016-09-02 NOTE — ED Notes (Signed)
Attempted to call report x 1  

## 2016-09-02 NOTE — ED Triage Notes (Signed)
Pt arrives from home via [facility] reporting weakness for past three days.  EMS reports pt laid down on floor three days ago, reports pt's friend has been coming over to feed pt.  Pt reports last ETOH possibly Sunday.  Pt denies trauma, fall.  Pt AOx4, NAD noted at this time.

## 2016-09-02 NOTE — ED Notes (Signed)
PA at [facility] attemp[ting IV.

## 2016-09-02 NOTE — ED Notes (Signed)
Patient transported to X-ray 

## 2016-09-02 NOTE — H&P (Signed)
History and Physical    Mark Meadows ZOX:[id number] DOB: 04-12-1951 DOA: 09/02/2016  PCP: Florina Ou Consultants:  None Patient coming from: home - lives alone; NOK: sister - [phone]  Chief Complaint: failure to live independently  HPI: Mark Meadows is a 65 y.o. male with medical history significant of HTN, hypothyroidism, and alcohol dependence with recent hospitalization from 12/1-7 for progressive weakness and lethargy found to have severe hypothyroidism with failure to thrive.  He reports that during that hospitalization the nurses "treated me like a king."  When asked why he came in today, he reported that he has an old dog, wife died 10 years ago from cancer.  His dog tries to take care of him but he was too much for her.  Has a friend who said he would be willing to care for the dog.  Prior to coming in for admission last time, "it was the lowest days of my life."  Unable to walk, had to hold onto window sills and door jams because he was so sick. Recommended to be placed in SNF, encouraged to maximize home health but patient refused home health.  He does not remember not wanting either of those things. He reports that he went home and "felt wonderful".  He thinks the medication might have worn off.  He didn't get any prescriptions filled after discharge, "I didn't know I was supposed to."  Obviously, based on above, he is a poor historian.  He is also unaccompanied.    History per PA-C Kirichenko: Mark Meadows is a 65 y.o. male with history of thyroid disease, hypertension, alcohol abuse, anemia, presents to emergency department with generalized weakness. Patient states he has been laying on his carpeted floor for last 3 days and was unable to get up due to weakness. He states he chose to lay on the floor or not his bed because that did not have sheets on it. He states he was unable to get up to use the bathroom and has been using the bathroom on himself. He has not taken any medications in  3 days. States his neighbor has been checking on him and bring him food. Patient denies pain anywhere. He denies seeing any more blood in his stool, states that he was recently admitted for GI bleed.   ED Course: Per PA-C Kirichenko: Patient emergency department for generalized weakness, unable to walk, spent 3 days on the floor. He is complaining of weakness, no any other significant complaints at this time. We'll get labs, will get TSH, patient apparently has been off of all his medications in the last 8 months because he would not fill on or take home. He was recently admitted for possible GI bleed, was found to be severely hypothyroid, no major GI bleed other than hemorrhoids noted on his colonoscopy.  6:28 PM  Labs show slightly low potassium 3.2, white count 15.6, hemoglobin is 9.4, actually above his baseline. CK level is 1182, TSH elevated at 32. Patient was given IV fluids. He is now complaining of left knee pain and states that could be the reason why he is not walking. He is not sure what when exactly he injured it. Will get an x-ray. Patient's family is now at bedside, his sister and nephew.  Spoke with [facility], pt agrees to placement and admission to the hospital. Family state that pt's house is covered with feces. He is unable to care for him at home alone.  8:00 PM  Pt appears to be in afib  after evaluation by admitting physician. Will check ECG and trop. Pt denies CP.    Review of Systems: As per HPI; otherwise 10 point review of systems reviewed and negative (as much as possible).   Ambulatory Status:  Ambulated a few days after discharge but then progressively got weak again  Past Medical History:  Diagnosis Date  . Acute blood loss anemia   . Alcohol dependence (HCC)   . Alcoholic hepatitis   . Dysrhythmia    "it doesn't move exactly the same; this is recent dx" (09/02/2016)  . GERD (gastroesophageal reflux disease)   . Gout   . Hematochezia   . Hyperglycemia   .  Hypertension   . Hypokalemia   . Hypothyroidism   . Thrombocytopenia (HCC)     Past Surgical History:  Procedure Laterality Date  . COLONOSCOPY N/A 08/26/2016   Procedure: COLONOSCOPY;  Surgeon: Meryl Dare, MD;  Location: [facility] ENDOSCOPY;  Service: Endoscopy;  Laterality: N/A;  . TONSILLECTOMY  1957    Social History   Social History  . Marital status: Widowed    Spouse name: N/A  . Number of children: N/A  . Years of education: N/A   Occupational History  . retired in 3/17    Social History Main Topics  . Smoking status: Former Smoker    Packs/day: 3.00    Years: 12.00    Types: Cigarettes    Quit date: 7  . Smokeless tobacco: Never Used  . Alcohol use 48.0 oz/week    80 Shots of liquor per week     Comment: 09/02/2016 "was at 3 large bottles of cheap bourbon/week; cut down to 2 bottles"  . Drug use:     Types: Marijuana     Comment: 09/02/2016 "tried marijuana 2 times; years ago"  . Sexual activity: Not Currently   Other Topics Concern  . Not on file   Social History Narrative  . No narrative on file    No Known Allergies  History reviewed. No pertinent family history.  Prior to Admission medications   Medication Sig Start Date End Date Taking? Authorizing Provider  ibuprofen (ADVIL,MOTRIN) 200 MG tablet Take 400 mg by mouth every 6 (six) hours as needed.   Yes Historical Provider, MD  amLODipine (NORVASC) 10 MG tablet Take 1 tablet (10 mg total) by mouth daily. Patient not taking: Reported on 09/02/2016 08/27/16   Kathlen Mody, MD  folic acid (FOLVITE) 1 MG tablet Take 1 tablet (1 mg total) by mouth daily. Patient not taking: Reported on 09/02/2016 08/28/16   Kathlen Mody, MD  hydrochlorothiazide (HYDRODIURIL) 25 MG tablet Take 1 tablet (25 mg total) by mouth daily. Patient not taking: Reported on 09/02/2016 08/27/16   Kathlen Mody, MD  hydrocortisone (ANUSOL-HC) 25 MG suppository Place 1 suppository (25 mg total) rectally 2 (two) times daily. Patient  not taking: Reported on 09/02/2016 08/27/16   Kathlen Mody, MD  levothyroxine (SYNTHROID, LEVOTHROID) 300 MCG tablet Take 1 tablet (300 mcg total) by mouth daily before breakfast. Patient not taking: Reported on 09/02/2016 08/27/16   Kathlen Mody, MD  metoprolol tartrate (LOPRESSOR) 25 MG tablet Take 0.5 tablets (12.5 mg total) by mouth 2 (two) times daily. Patient not taking: Reported on 09/02/2016 08/27/16   Kathlen Mody, MD  pantoprazole (PROTONIX) 40 MG tablet Take 1 tablet (40 mg total) by mouth 2 (two) times daily. Patient not taking: Reported on 09/02/2016 08/27/16   Kathlen Mody, MD  thiamine 100 MG tablet Take 1 tablet (100 mg  total) by mouth daily. Patient not taking: Reported on 09/02/2016 08/28/16   Kathlen Mody, MD    Physical Exam: Vitals:   09/02/16 2030 09/02/16 2045 09/02/16 2125 09/02/16 2258  BP: 123/83 116/79 (!) 147/81 (!) 148/84  Pulse: 84 85 88 96  Resp: 17 12 18    Temp:   98.4 F (36.9 C)   TempSrc:   Oral   SpO2: 99% 99% 98%   Weight:   100.4 kg (221 lb 4.8 oz)   Height:   5\' 10"  (1.778 m)      General: Appears disheveled, smells of stool, but is in NAD Eyes:  PERRL, EOMI, normal lids, iris ENT:  grossly normal hearing, lips & tongue, mmm Neck:  no LAD, masses or thyromegaly Cardiovascular:  Irregularly irregular, no m/r/g. No LE edema.  Respiratory:  CTA bilaterally, no w/r/r. Normal respiratory effort. Abdomen:  soft, ntnd, NABS Skin:  no rash or induration seen on limited exam Musculoskeletal:  grossly normal tone BUE/BLE, good ROM, no bony abnormality but mild effusion on left knee with c/o pain on evaluation Psychiatric:  grossly normal mood and affect, speech fluent and appropriate but tangential, AOx3 Neurologic:  CN 2-12 grossly intact, moves all extremities in coordinated fashion, sensation intact  Labs on Admission: I have personally reviewed following labs and imaging studies  CBC:  Recent Labs Lab 08/27/16 0549 08/27/16 1409 08/28/16 0205  08/28/16 1347 09/02/16 1659  WBC 7.2  --  7.2  --  15.6*  NEUTROABS  --   --   --   --  12.9*  HGB 7.7* 8.1* 8.3* 8.5* 9.4*  HCT 22.1* 23.8* 23.8* 24.7* 27.6*  MCV 116.3*  --  116.7*  --  116.5*  PLT 164  --  209  --  274   Basic Metabolic Panel:  Recent Labs Lab 08/27/16 0549 09/02/16 1659  NA 139 138  K 3.7 3.2*  CL 110 103  CO2 20* 27  GLUCOSE 91 141*  BUN 9 14  CREATININE 0.98 0.90  CALCIUM 8.2* 9.2  MG  --  1.8   GFR: Estimated Creatinine Clearance: 95.9 mL/min (by C-G formula based on SCr of 0.9 mg/dL). Liver Function Tests:  Recent Labs Lab 08/27/16 0549 09/02/16 1659  AST 255* 104*  ALT 121* 69*  ALKPHOS 128* 126  BILITOT 2.1* 2.8*  PROT 6.9 8.3*  ALBUMIN 3.1* 3.3*    Recent Labs Lab 08/27/16 0549 09/02/16 1659  LIPASE 35 18    Recent Labs Lab 08/27/16 0549 09/02/16 1659  AMMONIA 48* 14   Coagulation Profile:  Recent Labs Lab 09/02/16 1659  INR 1.04   Cardiac Enzymes:  Recent Labs Lab 09/02/16 1659 09/02/16 2217  CKTOTAL 1,182*  --   TROPONINI  --  0.04*   BNP (last 3 results) No results for input(s): PROBNP in the last 8760 hours. HbA1C: No results for input(s): HGBA1C in the last 72 hours. CBG:  Recent Labs Lab 08/27/16 1631 08/27/16 2120 08/28/16 0743 08/28/16 1209 09/02/16 2326  GLUCAP 101* 96 88 129* 131*   Lipid Profile: No results for input(s): CHOL, HDL, LDLCALC, TRIG, CHOLHDL, LDLDIRECT in the last 72 hours. Thyroid Function Tests:  Recent Labs  09/02/16 1659  TSH 32.635*   Anemia Panel: No results for input(s): VITAMINB12, FOLATE, FERRITIN, TIBC, IRON, RETICCTPCT in the last 72 hours. Urine analysis:    Component Value Date/Time   COLORURINE AMBER (A) 08/22/2016 1544   APPEARANCEUR CLEAR 08/22/2016 1544   LABSPEC 1.020 08/22/2016 1544  PHURINE 6.0 08/22/2016 1544   GLUCOSEU NEGATIVE 08/22/2016 1544   HGBUR SMALL (A) 08/22/2016 1544   BILIRUBINUR MODERATE (A) 08/22/2016 1544   KETONESUR 15 (A)  08/22/2016 1544   PROTEINUR 100 (A) 08/22/2016 1544   NITRITE POSITIVE (A) 08/22/2016 1544   LEUKOCYTESUR NEGATIVE 08/22/2016 1544    Creatinine Clearance: Estimated Creatinine Clearance: 95.9 mL/min (by C-G formula based on SCr of 0.9 mg/dL).  Sepsis Labs: @LABRCNTIP (procalcitonin:4,lacticidven:4) )No results found for this or any previous visit (from the past 240 hour(s)).   Radiological Exams on Admission: Dg Chest Portable 1 View  Result Date: 09/02/2016 CLINICAL DATA:  Weakness. EXAM: PORTABLE CHEST 1 VIEW COMPARISON:  None. FINDINGS: The heart is mildly enlarged. There is atelectasis at the left lung base. There is no evidence of pulmonary edema, consolidation, pneumothorax, nodule or pleural fluid. Some irregularity of left lower lateral ribs likely reflects old fractures. IMPRESSION: Left basilar atelectasis and mild cardiomegaly. Old left-sided rib fractures. Electronically Signed   By: Irish LackGlenn  Yamagata M.D.   On: 09/02/2016 16:30   Dg Knee Complete 4 Views Left  Result Date: 09/02/2016 CLINICAL DATA:  Acute left knee pain. EXAM: LEFT KNEE - COMPLETE 4+ VIEW COMPARISON:  None. FINDINGS: No evidence of fracture or dislocation. Moderate suprapatellar joint effusion is noted. No evidence of arthropathy or other focal bone abnormality. Soft tissues are unremarkable. IMPRESSION: Moderate size suprapatellar joint effusion. No fracture or dislocation is noted. Electronically Signed   By: Lupita RaiderJames  Green Jr, M.D.   On: 09/02/2016 19:10    EKG: Independently reviewed.  Afib with rate 90; inferior Q waves, anterolateral T wave inversion, prolonged QT (529)  Assessment/Plan Principal Problem:   Weakness Active Problems:   Hypothyroidism   Hypertension   Alcoholism /alcohol abuse (HCC)   Alcoholic hepatitis   Hypokalemia   Hyperglycemia   A-fib (HCC)   Prolonged Q-T interval on ECG   Non-traumatic rhabdomyolysis   Effusion of left knee   Weakness -Patient was previously admitted  and was recommended for SNF.  He refused.  He was then recommended for maximum home services.  He refused.  He went home and failed and was lying in the floor in his own feces for days.  His family will have to call a home evacuation service to clean the home successfully per report. -After a long discussion with the ER team, patient would only be re-admitted to our service (based on this diagnosis - new-onset afib was not yet diagnosed at that point) if the patient would be willing to agree to SNF placement -By the ER PA and attending report, the patient was willing to agree to this -At the time of my evaluation, we had his "balls to the wall" and so he agreed to SNF placement -SW consult will be requested as this patient has clearly demonstrated an inability to succeed at home -He already has a qualifying inpatient stay and so if medical problems are stabilized, the patient should be appropriate for placement sometime this week. -Of note, he did not obtain any prescriptions for medications given as an inpatient and so has not been taking any medications since the time of discharge -Mild leukocytosis, no obvious infection, will follow  New-onset afib -This may be related to his ongoing hypothyroidism -No prior EKG available from prior hospitalization -In Epic, there is a report of an outpatient EKG performed on 08/21/16 with the impression of "SR with bundle branch block.  No evidence of acute ischemic disease." -This is clearly different  from today's EKG -Patient admitted with telemetry -Trend troponins -Afib order set utilized -He is currently rate controlled -Based on recent h/o hematochezia and persistent (but improved - Hgb up to 9.3), would not anticoagulate at this time -CHA2DS2-VASc Score is 2, with a stroke rate of 2.2%/year -Will request cardiology consultation in AM via Inbox message to the [employer].  EKG changes including prolonged QT -Denies chest pain -Troponin minimally  elevated at 0.04 -Likely demand ischemia in the setting of multiple medical problems including severe hypothyroidism -Will trend troponin -Repeat EKG prn -ASA 81 mg PO daily -Cardiology consult in AM -Attempt to avoid QT prolonging medications  Malnutrition -Slightly improved since prior hospitalization -Likely to markedly improve with palcement  Alcoholism with hepatitis -Slightly improvement in AST/ALT from prior hospitalization with slight increase in hyperbilirubinemia -CIWA protocol -Encourage cessation  Rhabdomyolysis -CK 1182 -Very mild rhabdo -Non-traumatic, likely from lying in the floor unable to move -LR at 150 cc/hr  Hypothyroidism -TSH 32.635 (prior 62.857) -Will resume PO Synthroid -Could consider 1 dose of IV medication, but TSH is already half of previous value  Hyperglycemia -A1c <5 on prior admission -Glucose >140 today -Will cover with SSI, no other intervention is indicated  Left knee effusion -Likely related to fall -Will follow -Tylenol prn with morphine as needed for severe pain  DVT prophylaxis: SCDs Code Status: DNR - confirmed with patient Family Communication: Attempted to call patient's sister and left a message on her voice mail.   Disposition Plan: SNF Consults called: SW, cardiology (via inbox message) Admission status: Admit - It is my clinical opinion that admission to INPATIENT is reasonable and necessary because this patient will require at least 2 midnights in the hospital to treat this condition based on the medical complexity of the problems presented.  Given the aforementioned information, the predictability of an adverse outcome is felt to be significant.    Jonah BlueJennifer Daenerys Buttram MD [facility]  If 7PM-7AM, please contact night-coverage [email or web address] Password [email or web address]  09/03/2016, 1:37 AM

## 2016-09-02 NOTE — ED Notes (Signed)
Pt has dried stool on body. Talking at length with PA.

## 2016-09-02 NOTE — ED Notes (Signed)
Report called and given to nurse on 2W 

## 2016-09-02 NOTE — Progress Notes (Signed)
CRITICAL VALUE ALERT  Critical value received:  Trop 0.04  Date of notification:  09/02/16  Time of notification:  11:11pm  Critical value read back:Yes.    Nurse who received alert:  Caesar ChestnutAntonique Shelia Magallon  MD notified (1st page):  [facility] Donnamarie PoagK Kirby  Time of first page:  11:20pm  No further orders at this time.

## 2016-09-02 NOTE — ED Provider Notes (Signed)
[facility]-EMERGENCY DEPT Provider Note   CSN: [id number] Arrival date & time: 09/02/16  1536     History   Chief Complaint Chief Complaint  Patient presents with  . Weakness    HPI Mark Meadows is a 65 y.o. male.  HPI Mark Meadows is a 65 y.o. male with history of thyroid disease, hypertension, alcohol abuse, anemia, presents to emergency department with generalized weakness. Patient states he has been laying on his carpeted floor for last 3 days and was unable to get up due to weakness. He states he chose to lay on the floor or not his bed because that did not have sheets on it. He states he was unable to get up to use the bathroom and has been using the bathroom on himself. He has not taken any medications in 3 days. States his neighbor has been checking on him and bring him food. Patient denies pain anywhere. He denies seeing any more blood in his stool, states that he was recently admitted for GI bleed.   Past Medical History:  Diagnosis Date  . Hypertension   . Thyroid disease     Patient Active Problem List   Diagnosis Date Noted  . Hematochezia   . Rectal bleeding 08/22/2016  . Hypothyroidism 08/22/2016  . Alcoholism /alcohol abuse (HCC) 08/22/2016  . Alcoholic hepatitis 08/22/2016  . Hypokalemia 08/22/2016  . Hyperglycemia 08/22/2016  . Thrombocytopenia (HCC) 08/22/2016  . Acute blood loss anemia 08/22/2016  . Hypertension     Past Surgical History:  Procedure Laterality Date  . COLONOSCOPY N/A 08/26/2016   Procedure: COLONOSCOPY;  Surgeon: Meryl Dare, MD;  Location: [facility] ENDOSCOPY;  Service: Endoscopy;  Laterality: N/A;       Home Medications    Prior to Admission medications   Medication Sig Start Date End Date Taking? Authorizing Provider  ibuprofen (ADVIL,MOTRIN) 200 MG tablet Take 400 mg by mouth every 6 (six) hours as needed.   Yes Historical Provider, MD  amLODipine (NORVASC) 10 MG tablet Take 1 tablet (10 mg total) by mouth daily. Patient not  taking: Reported on 09/02/2016 08/27/16   Kathlen Mody, MD  folic acid (FOLVITE) 1 MG tablet Take 1 tablet (1 mg total) by mouth daily. Patient not taking: Reported on 09/02/2016 08/28/16   Kathlen Mody, MD  hydrochlorothiazide (HYDRODIURIL) 25 MG tablet Take 1 tablet (25 mg total) by mouth daily. Patient not taking: Reported on 09/02/2016 08/27/16   Kathlen Mody, MD  hydrocortisone (ANUSOL-HC) 25 MG suppository Place 1 suppository (25 mg total) rectally 2 (two) times daily. Patient not taking: Reported on 09/02/2016 08/27/16   Kathlen Mody, MD  levothyroxine (SYNTHROID, LEVOTHROID) 300 MCG tablet Take 1 tablet (300 mcg total) by mouth daily before breakfast. Patient not taking: Reported on 09/02/2016 08/27/16   Kathlen Mody, MD  metoprolol tartrate (LOPRESSOR) 25 MG tablet Take 0.5 tablets (12.5 mg total) by mouth 2 (two) times daily. Patient not taking: Reported on 09/02/2016 08/27/16   Kathlen Mody, MD  pantoprazole (PROTONIX) 40 MG tablet Take 1 tablet (40 mg total) by mouth 2 (two) times daily. Patient not taking: Reported on 09/02/2016 08/27/16   Kathlen Mody, MD  thiamine 100 MG tablet Take 1 tablet (100 mg total) by mouth daily. Patient not taking: Reported on 09/02/2016 08/28/16   Kathlen Mody, MD    Family History No family history on file.  Social History Social History  Substance Use Topics  . Smoking status: Never Smoker  . Smokeless tobacco: Not on file  .  Alcohol use Yes     Allergies   Patient has no known allergies.   Review of Systems Review of Systems  Constitutional: Positive for fatigue. Negative for chills and fever.  Respiratory: Negative for cough, chest tightness and shortness of breath.   Cardiovascular: Negative for chest pain, palpitations and leg swelling.  Gastrointestinal: Negative for abdominal distention, abdominal pain, diarrhea, nausea and vomiting.  Genitourinary: Negative for dysuria, frequency, hematuria and urgency.  Musculoskeletal: Positive for  arthralgias. Negative for myalgias, neck pain and neck stiffness.  Skin: Negative for rash.  Allergic/Immunologic: Negative for immunocompromised state.  Neurological: Positive for weakness. Negative for dizziness, light-headedness, numbness and headaches.  All other systems reviewed and are negative.    Physical Exam Updated Vital Signs BP 136/99   Pulse 97   Temp 98.8 F (37.1 C) (Oral)   Resp 23   SpO2 99%   Physical Exam  Constitutional: He is oriented to person, place, and time. He appears well-developed and well-nourished.  Deshelved, covered in feces  HENT:  Head: Normocephalic and atraumatic.  Eyes: Conjunctivae and EOM are normal. Pupils are equal, round, and reactive to light.  Neck: Normal range of motion. Neck supple.  Cardiovascular: Normal rate, regular rhythm and normal heart sounds.   Pulmonary/Chest: Effort normal. No respiratory distress. He has no wheezes. He has no rales.  Abdominal: Soft. Bowel sounds are normal. He exhibits no distension. There is no tenderness. There is no rebound.  Musculoskeletal: He exhibits edema.  2+ bilateral LE edema. Left knee with swelling. TTP diffusely. Pain with ROM. Normal ROM of right knee, bilateral hips, ankles. Feet normal.   Neurological: He is alert and oriented to person, place, and time. No cranial nerve deficit. Coordination normal.  5/5 and equal upper and lower extremity strength bilaterally. Equal grip strength bilaterally. Normal finger to nose and heel to shin. No pronator drift. nor  Skin: Skin is warm and dry.  Nursing note and vitals reviewed.    ED Treatments / Results  Labs (all labs ordered are listed, but only abnormal results are displayed) Labs Reviewed  COMPREHENSIVE METABOLIC PANEL - Abnormal; Notable for the following:       Result Value   Potassium 3.2 (*)    Glucose, Bld 141 (*)    Total Protein 8.3 (*)    Albumin 3.3 (*)    AST 104 (*)    ALT 69 (*)    Total Bilirubin 2.8 (*)    All  other components within normal limits  CBC WITH DIFFERENTIAL/PLATELET - Abnormal; Notable for the following:    WBC 15.6 (*)    RBC 2.37 (*)    Hemoglobin 9.4 (*)    HCT 27.6 (*)    MCV 116.5 (*)    MCH 39.7 (*)    RDW 15.7 (*)    Neutro Abs 12.9 (*)    Monocytes Absolute 1.6 (*)    All other components within normal limits  TSH - Abnormal; Notable for the following:    TSH 32.635 (*)    All other components within normal limits  CK - Abnormal; Notable for the following:    Total CK 1,182 (*)    All other components within normal limits  AMMONIA  LIPASE, BLOOD  PROTIME-INR  MAGNESIUM  ETHANOL  URINALYSIS, ROUTINE W REFLEX MICROSCOPIC  POC OCCULT BLOOD, ED  TYPE AND SCREEN    EKG  EKG Interpretation None       Radiology Dg Chest Portable 1 View  Result Date: 09/02/2016 CLINICAL DATA:  Weakness. EXAM: PORTABLE CHEST 1 VIEW COMPARISON:  None. FINDINGS: The heart is mildly enlarged. There is atelectasis at the left lung base. There is no evidence of pulmonary edema, consolidation, pneumothorax, nodule or pleural fluid. Some irregularity of left lower lateral ribs likely reflects old fractures. IMPRESSION: Left basilar atelectasis and mild cardiomegaly. Old left-sided rib fractures. Electronically Signed   By: Irish LackGlenn  Yamagata M.D.   On: 09/02/2016 16:30    Procedures Procedures (including critical care time)  Medications Ordered in ED Medications  LORazepam (ATIVAN) tablet 1 mg ( Oral See Alternative 09/03/16 0056)    Or  LORazepam (ATIVAN) injection 1 mg (1 mg Intravenous Given 09/03/16 0056)  multivitamin with minerals tablet 1 tablet (1 tablet Oral Given 09/02/16 2258)  acetaminophen (TYLENOL) tablet 650 mg (not administered)  ondansetron (ZOFRAN) injection 4 mg (not administered)  sodium chloride flush (NS) 0.9 % injection 3 mL (3 mLs Intravenous Given 09/02/16 2300)  sodium chloride flush (NS) 0.9 % injection 3 mL (not administered)  0.9 %  sodium chloride  infusion (not administered)  metoprolol (LOPRESSOR) injection 2.5 mg (2.5 mg Intravenous Not Given 09/02/16 2304)  insulin aspart (novoLOG) injection 0-9 Units (not administered)  folic acid (FOLVITE) tablet 1 mg (1 mg Oral Given 09/02/16 2259)  thiamine (VITAMIN B-1) tablet 100 mg (100 mg Oral Given 09/02/16 2258)  levothyroxine (SYNTHROID, LEVOTHROID) tablet 300 mcg (not administered)  metoprolol tartrate (LOPRESSOR) tablet 12.5 mg (12.5 mg Oral Given 09/02/16 2259)  pantoprazole (PROTONIX) EC tablet 40 mg (40 mg Oral Given 09/02/16 2259)  sodium chloride 0.9 % bolus 1,000 mL (0 mLs Intravenous Stopped 09/02/16 2030)     Initial Impression / Assessment and Plan / ED Course  I have reviewed the triage vital signs and the nursing notes.  Pertinent labs & imaging results that were available during my care of the patient were reviewed by me and considered in my medical decision making (see chart for details).  Clinical Course     Patient emergency department for generalized weakness, unable to walk, spent 3 days on the floor. He is complaining of weakness, no any other significant complaints at this time. We'll get labs, will get TSH, patient apparently has been off of all his medications in the last 8 months because he would not fill on or take home. He was recently admitted for possible GI bleed, was found to be severely hypothyroid, no major GI bleed other than hemorrhoids noted on his colonoscopy.  6:28 PM Labs show slightly low potassium 3.2, white count 15.6, hemoglobin is 9.4, actually above his baseline. CK level is 1182, TSH elevated at 32. Patient was given IV fluids. He is now complaining of left knee pain and states that could be the reason why he is not walking. He is not sure what when exactly he injured it. Will get an x-ray. Patient's family is now at bedside, his sister and nephew.  Spoke with triad, pt agrees to placement and admission to the hospital. Family state that pt's  house is covered with feces. He is unable to care for him at home alone.   8:00 PM Pt appears to be in afib after evaluation by admitting physician. Will check ECG and trop. Pt denies CP.   Vitals:   09/02/16 2030 09/02/16 2045 09/02/16 2125 09/02/16 2258  BP: 123/83 116/79 (!) 147/81 (!) 148/84  Pulse: 84 85 88 96  Resp: 17 12 18    Temp:   98.4 F (  36.9 C)   TempSrc:   Oral   SpO2: 99% 99% 98%   Weight:   100.4 kg   Height:   5\' 10"  (1.778 m)          Final Clinical Impressions(s) / ED Diagnoses   Final diagnoses:  Dehydration  Weakness  Hypothyroidism, unspecified type    New Prescriptions Current Discharge Medication List       Jaynie Crumbleatyana Janeene Sand, PA-C 09/03/16 0138    Jacalyn LefevreJulie Haviland, MD 09/05/16 2155

## 2016-09-03 DIAGNOSIS — R531 Weakness: Secondary | ICD-10-CM

## 2016-09-03 DIAGNOSIS — M6282 Rhabdomyolysis: Secondary | ICD-10-CM | POA: Diagnosis present

## 2016-09-03 DIAGNOSIS — M25462 Effusion, left knee: Secondary | ICD-10-CM | POA: Diagnosis present

## 2016-09-03 DIAGNOSIS — R9431 Abnormal electrocardiogram [ECG] [EKG]: Secondary | ICD-10-CM | POA: Diagnosis present

## 2016-09-03 LAB — PROTIME-INR
INR: 1.07
Prothrombin Time: 13.9 seconds (ref 11.4–15.2)

## 2016-09-03 LAB — URINALYSIS, ROUTINE W REFLEX MICROSCOPIC
Bacteria, UA: NONE SEEN
Bilirubin Urine: NEGATIVE
GLUCOSE, UA: NEGATIVE mg/dL
Ketones, ur: NEGATIVE mg/dL
Leukocytes, UA: NEGATIVE
NITRITE: NEGATIVE
PROTEIN: 100 mg/dL — AB
Specific Gravity, Urine: 1.017 (ref 1.005–1.030)
pH: 5 (ref 5.0–8.0)

## 2016-09-03 LAB — GLUCOSE, CAPILLARY
GLUCOSE-CAPILLARY: 123 mg/dL — AB (ref 65–99)
GLUCOSE-CAPILLARY: 98 mg/dL (ref 65–99)
GLUCOSE-CAPILLARY: 99 mg/dL (ref 65–99)

## 2016-09-03 LAB — CBC
HCT: 25 % — ABNORMAL LOW (ref 39.0–52.0)
Hemoglobin: 8.3 g/dL — ABNORMAL LOW (ref 13.0–17.0)
MCH: 38.8 pg — ABNORMAL HIGH (ref 26.0–34.0)
MCHC: 33.2 g/dL (ref 30.0–36.0)
MCV: 116.8 fL — ABNORMAL HIGH (ref 78.0–100.0)
Platelets: 256 K/uL (ref 150–400)
RBC: 2.14 MIL/uL — ABNORMAL LOW (ref 4.22–5.81)
RDW: 15.4 % (ref 11.5–15.5)
WBC: 13.7 K/uL — ABNORMAL HIGH (ref 4.0–10.5)

## 2016-09-03 LAB — BASIC METABOLIC PANEL
ANION GAP: 10 (ref 5–15)
BUN: 16 mg/dL (ref 6–20)
CO2: 26 mmol/L (ref 22–32)
Calcium: 8.7 mg/dL — ABNORMAL LOW (ref 8.9–10.3)
Chloride: 107 mmol/L (ref 101–111)
Creatinine, Ser: 0.89 mg/dL (ref 0.61–1.24)
GFR calc Af Amer: >60 mL/min (ref >60)
GFR calc non Af Amer: >60 mL/min (ref >60)
Glucose, Bld: 120 mg/dL — ABNORMAL HIGH (ref 65–99)
Potassium: 2.8 mmol/L — ABNORMAL LOW (ref 3.5–5.1)
Sodium: 143 mmol/L (ref 135–145)

## 2016-09-03 LAB — LIPID PANEL
Cholesterol: 159 mg/dL (ref 0–200)
HDL: 26 mg/dL — AB (ref >40)
LDL CALC: 110 mg/dL — AB (ref 0–99)
TRIGLYCERIDES: 117 mg/dL (ref <150)
Total CHOL/HDL Ratio: 6.1 RATIO
VLDL: 23 mg/dL (ref 0–40)

## 2016-09-03 LAB — TROPONIN I
Troponin I: 0.05 ng/mL
Troponin I: 0.03 ng/mL

## 2016-09-03 MED ORDER — FERROUS GLUCONATE 324 (38 FE) MG PO TABS
324.0000 mg | ORAL_TABLET | Freq: Every day | ORAL | Status: DC
  Administered 2016-09-03 – 2016-09-05 (×3): 324 mg via ORAL
  Filled 2016-09-03 (×3): qty 1

## 2016-09-03 MED ORDER — POTASSIUM CHLORIDE CRYS ER 20 MEQ PO TBCR
40.0000 meq | EXTENDED_RELEASE_TABLET | Freq: Once | ORAL | Status: AC
  Administered 2016-09-03: 40 meq via ORAL
  Filled 2016-09-03: qty 2

## 2016-09-03 MED ORDER — CYANOCOBALAMIN 1000 MCG/ML IJ SOLN
1000.0000 ug | Freq: Once | INTRAMUSCULAR | Status: AC
  Administered 2016-09-03: 1000 ug via INTRAMUSCULAR
  Filled 2016-09-03: qty 1

## 2016-09-03 MED ORDER — LACTATED RINGERS IV SOLN
INTRAVENOUS | Status: DC
  Administered 2016-09-03 – 2016-09-04 (×2): via INTRAVENOUS

## 2016-09-03 MED ORDER — COLCHICINE 0.6 MG PO TABS
1.2000 mg | ORAL_TABLET | Freq: Once | ORAL | Status: AC
  Administered 2016-09-03: 1.2 mg via ORAL
  Filled 2016-09-03: qty 2

## 2016-09-03 MED ORDER — TAMSULOSIN HCL 0.4 MG PO CAPS
0.4000 mg | ORAL_CAPSULE | Freq: Every day | ORAL | Status: DC
  Administered 2016-09-03 – 2016-09-04 (×2): 0.4 mg via ORAL
  Filled 2016-09-03: qty 1

## 2016-09-03 MED ORDER — COLCHICINE 0.6 MG PO TABS
0.6000 mg | ORAL_TABLET | Freq: Every day | ORAL | Status: DC
  Administered 2016-09-04 – 2016-09-05 (×2): 0.6 mg via ORAL
  Filled 2016-09-03 (×2): qty 1

## 2016-09-03 MED ORDER — MORPHINE SULFATE (PF) 2 MG/ML IV SOLN
2.0000 mg | INTRAVENOUS | Status: DC | PRN
  Administered 2016-09-04: 2 mg via INTRAVENOUS
  Filled 2016-09-03: qty 1

## 2016-09-03 MED ORDER — ASPIRIN 81 MG PO CHEW
81.0000 mg | CHEWABLE_TABLET | Freq: Every day | ORAL | Status: DC
  Administered 2016-09-03 – 2016-09-05 (×3): 81 mg via ORAL
  Filled 2016-09-03 (×3): qty 1

## 2016-09-03 NOTE — Progress Notes (Addendum)
PROGRESS NOTE  Mark Meadows ZOX:[id number] DOB: September 03, 1951 DOA: 09/02/2016 PCP: Elizabeth PalauANDERSON,TERESA, FNP   LOS: 1 day   Brief Narrative: Mark Meadows is a 65 y.o. male with medical history significant of HTN, hypothyroidism, and alcohol dependence with recent hospitalization from 12/1-7 for progressive weakness and lethargy found to have severe hypothyroidism with failure to thrive, was discharged home however was unable to walk or care for himself and was brought to the hospital. He was found to be in A. Fib. He also continues to drink alcohol. Of note, during his last hospitalization, patient refused SNF placement.   Assessment & Plan: Principal Problem:   Weakness Active Problems:   Hypothyroidism   Hypertension   Alcoholism /alcohol abuse (HCC)   Alcoholic hepatitis   Hypokalemia   Hyperglycemia   A-fib (HCC)   Prolonged Q-T interval on ECG   Non-traumatic rhabdomyolysis   Effusion of left knee   Weakness - Patient was previously admitted and was recommended for SNF.  He refused.  He was then recommended for maximum home services.  He refused.  He went home and failed and was lying in the floor in his own feces for days.  His family will have to call a home evacuation service to clean the home successfully per report. - PT evaluation pending, suspect he will need SNF  ? New-onset afib - This may be related to his ongoing hypothyroidism, ? On tele in ED, 12 lead without clear evidence - d/w cardiology, does not appear to be in a fib actually  Malnutrition with vitamin B12 and folate deficiency - B12 im x 1 now, continue folic acid  Alcoholism with hepatitis - Slightly improvement in AST/ALT from prior hospitalization with slight increase in hyperbilirubinemia - CIWA protocol - Encouraged cessation  Rhabdomyolysis - CK 1182 - Non-traumatic, likely from lying in the floor unable to move - LR at 150 cc/hr >> decreased to 75 mL per hour  Hypothyroidism - TSH 32.635  (prior 62.857) - Will resume PO Synthroid  Hyperglycemia - A1c <5 on prior admission - Glucose >140 today - Will cover with SSI, no other intervention is indicated  Left knee effusion - Likely related to fall - Will follow - Tylenol prn with morphine as needed for severe pain   DVT prophylaxis: SCDs Code Status: DNR Family Communication: d/w sister and nephew bedside Disposition Plan: SNF when ready   Consultants:   Cardiology   Procedures:   None   Antimicrobials:  None    Subjective: - mild confusion, complaints of weakness otherwise he denies any chest pain, denies any shortness of breath, has no abdominal pain, no nausea, vomiting or diarrhea.  Objective: Vitals:   09/02/16 2045 09/02/16 2125 09/02/16 2258 09/03/16 0441  BP: 116/79 (!) 147/81 (!) 148/84 (!) 141/78  Pulse: 85 88 96 84  Resp: 12 18  18   Temp:  98.4 F (36.9 C)  97.5 F (36.4 C)  TempSrc:  Oral  Oral  SpO2: 99% 98%  94%  Weight:  100.4 kg (221 lb 4.8 oz)    Height:  5\' 10"  (1.778 m)      Intake/Output Summary (Last 24 hours) at 09/03/16 1308 Last data filed at 09/03/16 1200  Gross per 24 hour  Intake             1240 ml  Output               50 ml  Net  1190 ml   Filed Weights   09/02/16 1920 09/02/16 2125  Weight: 105.2 kg (232 lb) 100.4 kg (221 lb 4.8 oz)    Examination: Constitutional: NAD Vitals:   09/02/16 2045 09/02/16 2125 09/02/16 2258 09/03/16 0441  BP: 116/79 (!) 147/81 (!) 148/84 (!) 141/78  Pulse: 85 88 96 84  Resp: 12 18  18   Temp:  98.4 F (36.9 C)  97.5 F (36.4 C)  TempSrc:  Oral  Oral  SpO2: 99% 98%  94%  Weight:  100.4 kg (221 lb 4.8 oz)    Height:  5\' 10"  (1.778 m)     Eyes: PERRL, + scleral icterus Respiratory: clear to auscultation bilaterally, no wheezing, no crackles. Normal respiratory effort. No accessory muscle use.  Cardiovascular: regular, no MRG Abdomen: no tenderness. Bowel sounds positive.  Musculoskeletal: no clubbing /  cyanosis. Skin: ecchymosis left upper chest and left shoulder  Neurologic: CN 2-12 grossly intact. Strength 5/5 in all 4.  Psychiatric: Alert and oriented x 2-3. Normal mood.    Data Reviewed: I have personally reviewed following labs and imaging studies  CBC:  Recent Labs Lab 08/27/16 1409 08/28/16 0205 08/28/16 1347 09/02/16 1659 09/03/16 0254  WBC  --  7.2  --  15.6* 13.7*  NEUTROABS  --   --   --  12.9*  --   HGB 8.1* 8.3* 8.5* 9.4* 8.3*  HCT 23.8* 23.8* 24.7* 27.6* 25.0*  MCV  --  116.7*  --  116.5* 116.8*  PLT  --  209  --  274 256   Basic Metabolic Panel:  Recent Labs Lab 09/02/16 1659 09/03/16 0254  NA 138 143  K 3.2* 2.8*  CL 103 107  CO2 27 26  GLUCOSE 141* 120*  BUN 14 16  CREATININE 0.90 0.89  CALCIUM 9.2 8.7*  MG 1.8  --    GFR: Estimated Creatinine Clearance: 97 mL/min (by C-G formula based on SCr of 0.89 mg/dL). Liver Function Tests:  Recent Labs Lab 09/02/16 1659  AST 104*  ALT 69*  ALKPHOS 126  BILITOT 2.8*  PROT 8.3*  ALBUMIN 3.3*    Recent Labs Lab 09/02/16 1659  LIPASE 18    Recent Labs Lab 09/02/16 1659  AMMONIA 14   Coagulation Profile:  Recent Labs Lab 09/02/16 1659 09/03/16 0254  INR 1.04 1.07   Cardiac Enzymes:  Recent Labs Lab 09/02/16 1659 09/02/16 2217 09/03/16 0254 09/03/16 0855  CKTOTAL 1,182*  --   --   --   TROPONINI  --  0.04* 0.05* 0.03*   BNP (last 3 results) No results for input(s): PROBNP in the last 8760 hours. HbA1C: No results for input(s): HGBA1C in the last 72 hours. CBG:  Recent Labs Lab 08/28/16 0743 08/28/16 1209 09/02/16 2326 09/03/16 0619 09/03/16 1145  GLUCAP 88 129* 131* 123* 98   Lipid Profile:  Recent Labs  09/03/16 0254  CHOL 159  HDL 26*  LDLCALC 110*  TRIG 117  CHOLHDL 6.1   Thyroid Function Tests:  Recent Labs  09/02/16 1659  TSH 32.635*   Anemia Panel: No results for input(s): VITAMINB12, FOLATE, FERRITIN, TIBC, IRON, RETICCTPCT in the last 72  hours. Urine analysis:    Component Value Date/Time   COLORURINE AMBER (A) 09/03/2016 0953   APPEARANCEUR CLEAR 09/03/2016 0953   LABSPEC 1.017 09/03/2016 0953   PHURINE 5.0 09/03/2016 0953   GLUCOSEU NEGATIVE 09/03/2016 0953   HGBUR MODERATE (A) 09/03/2016 0953   BILIRUBINUR NEGATIVE 09/03/2016 0953   KETONESUR NEGATIVE  09/03/2016 0953   PROTEINUR 100 (A) 09/03/2016 0953   NITRITE NEGATIVE 09/03/2016 0953   LEUKOCYTESUR NEGATIVE 09/03/2016 0953   Sepsis Labs: Invalid input(s): PROCALCITONIN, LACTICIDVEN  No results found for this or any previous visit (from the past 240 hour(s)).    Radiology Studies: Dg Chest Portable 1 View  Result Date: 09/02/2016 CLINICAL DATA:  Weakness. EXAM: PORTABLE CHEST 1 VIEW COMPARISON:  None. FINDINGS: The heart is mildly enlarged. There is atelectasis at the left lung base. There is no evidence of pulmonary edema, consolidation, pneumothorax, nodule or pleural fluid. Some irregularity of left lower lateral ribs likely reflects old fractures. IMPRESSION: Left basilar atelectasis and mild cardiomegaly. Old left-sided rib fractures. Electronically Signed   By: Irish LackGlenn  Yamagata M.D.   On: 09/02/2016 16:30   Dg Knee Complete 4 Views Left  Result Date: 09/02/2016 CLINICAL DATA:  Acute left knee pain. EXAM: LEFT KNEE - COMPLETE 4+ VIEW COMPARISON:  None. FINDINGS: No evidence of fracture or dislocation. Moderate suprapatellar joint effusion is noted. No evidence of arthropathy or other focal bone abnormality. Soft tissues are unremarkable. IMPRESSION: Moderate size suprapatellar joint effusion. No fracture or dislocation is noted. Electronically Signed   By: Lupita RaiderJames  Green Jr, M.D.   On: 09/02/2016 19:10     Scheduled Meds: . aspirin  81 mg Oral Daily  . folic acid  1 mg Oral Daily  . insulin aspart  0-9 Units Subcutaneous TID WC  . levothyroxine  300 mcg Oral QAC breakfast  . metoprolol tartrate  12.5 mg Oral BID  . multivitamin with minerals  1 tablet  Oral Daily  . sodium chloride flush  3 mL Intravenous Q12H  . thiamine  100 mg Oral Daily   Continuous Infusions: . lactated ringers 150 mL/hr at 09/03/16 0301    Pamella Pertostin Gherghe, MD, PhD [facility] Pager [phone]  If 7PM-7AM, please contact night-coverage [email or web address] Password [email or web address] 09/03/2016, 1:08 PM

## 2016-09-03 NOTE — Progress Notes (Signed)
Clinical Social Worker met patient at bedside to offer support and discuss patients needs at discharge. Patient stated he lives at home by himself and his only family are in the [place] area. CSW was unable to speak to family before family left unit. Patient stated that he drinks bourbon regularly and after discharge will go to the [facility] store to buy a full bottle of bourbon. Patient stated that he likes drinking because it makes him feel accomplished. Patient stated he has been drinking since the age of 71 and does not know why he drinks heavily. CSW offered resources to patient about substance abuse resources, patient stated he does not want to quit drinking and is not sure if he wants the resources. CSW remains available for support and discharge needs.  Rhea Pink, MSW,  [place]

## 2016-09-03 NOTE — Clinical Social Work Note (Signed)
CSW visited room and talked with patient's sister, [name] and nephew, Nicky PughCollin Scheible. Both had questions about applying for Medicaid, HCPOA, Living Will and Durable POA and these were discussed. Patient's nurse informed of request for HCPOA/Living Will packet and she will provide to family. Unit CSW will be provided with update.  Genelle BalVanessa Yurem Viner, MSW, LCSW Licensed Clinical Social Worker Clinical Social Work Department [facility] [phone]

## 2016-09-04 ENCOUNTER — Inpatient Hospital Stay (HOSPITAL_COMMUNITY): Payer: Medicare Other

## 2016-09-04 LAB — GLUCOSE, CAPILLARY
GLUCOSE-CAPILLARY: 83 mg/dL (ref 65–99)
GLUCOSE-CAPILLARY: 101 mg/dL — AB (ref 65–99)
GLUCOSE-CAPILLARY: 91 mg/dL (ref 65–99)
Glucose-Capillary: 88 mg/dL (ref 65–99)
Glucose-Capillary: 92 mg/dL (ref 65–99)

## 2016-09-04 LAB — CK: Total CK: 549 U/L — ABNORMAL HIGH (ref 49–397)

## 2016-09-04 LAB — CBC
HCT: 22.8 % — ABNORMAL LOW (ref 39.0–52.0)
HEMOGLOBIN: 7.4 g/dL — AB (ref 13.0–17.0)
MCH: 38.1 pg — ABNORMAL HIGH (ref 26.0–34.0)
MCHC: 32.5 g/dL (ref 30.0–36.0)
MCV: 117.5 fL — ABNORMAL HIGH (ref 78.0–100.0)
Platelets: 214 10*3/uL (ref 150–400)
RBC: 1.94 MIL/uL — AB (ref 4.22–5.81)
RDW: 15.2 % (ref 11.5–15.5)
WBC: 10.5 10*3/uL (ref 4.0–10.5)

## 2016-09-04 LAB — COMPREHENSIVE METABOLIC PANEL
ALK PHOS: 112 U/L (ref 38–126)
ALT: 62 U/L (ref 17–63)
ANION GAP: 9 (ref 5–15)
AST: 112 U/L — ABNORMAL HIGH (ref 15–41)
Albumin: 2.4 g/dL — ABNORMAL LOW (ref 3.5–5.0)
BILIRUBIN TOTAL: 1.5 mg/dL — AB (ref 0.3–1.2)
BUN: 13 mg/dL (ref 6–20)
CALCIUM: 8.3 mg/dL — AB (ref 8.9–10.3)
CO2: 25 mmol/L (ref 22–32)
Chloride: 106 mmol/L (ref 101–111)
Creatinine, Ser: 0.86 mg/dL (ref 0.61–1.24)
GLUCOSE: 96 mg/dL (ref 65–99)
POTASSIUM: 3.2 mmol/L — AB (ref 3.5–5.1)
Sodium: 140 mmol/L (ref 135–145)
TOTAL PROTEIN: 6.5 g/dL (ref 6.5–8.1)

## 2016-09-04 LAB — PROTIME-INR
INR: 1.14
PROTHROMBIN TIME: 14.7 s (ref 11.4–15.2)

## 2016-09-04 LAB — HEMOGLOBIN AND HEMATOCRIT, BLOOD
HCT: 23.8 % — ABNORMAL LOW (ref 39.0–52.0)
HEMOGLOBIN: 7.9 g/dL — AB (ref 13.0–17.0)

## 2016-09-04 MED ORDER — TAMSULOSIN HCL 0.4 MG PO CAPS: 0.4000 mg | ORAL_CAPSULE | Freq: Every day | ORAL | Status: DC

## 2016-09-04 MED ORDER — POTASSIUM CHLORIDE CRYS ER 20 MEQ PO TBCR
40.0000 meq | EXTENDED_RELEASE_TABLET | Freq: Once | ORAL | Status: AC
  Administered 2016-09-04: 40 meq via ORAL
  Filled 2016-09-04: qty 2

## 2016-09-04 MED ORDER — VITAMIN B-12 1000 MCG PO TABS
1000.0000 ug | ORAL_TABLET | Freq: Every day | ORAL | 1 refills | Status: DC
Start: 2016-09-04 — End: 2016-09-17

## 2016-09-04 NOTE — Progress Notes (Signed)
   09/04/16 0700  Clinical Encounter Type  Visited With Patient;Patient and family together  Visit Type Initial;Psychological support;Spiritual support;Social support  Referral From Family  Consult/Referral To Chaplain  Spiritual Encounters  Spiritual Needs Literature;Prayer;Emotional  Stress Factors  Patient Stress Factors Health changes  Family Stress Factors Exhausted;Health changes;Lack of caregivers  Mental Health Advance Directives  Does Patient Have a Mental Health Advance Directive? No  Would patient like information on creating a mental health advance directive? Yes (Inpatient - patient requests chaplain consult to create a mental health advance directive)   Chaplain visited [name] and talked with patient's sister, [name] and nephew, Nicky PughCollin Scheible. Chaplain was asked to complete a HCPOA form, Living Will/ Durable POA, however our notary won't be in until 8:20am. Chaplain has made the office of spiritual care aware of [name] request. It is our hope that we can get Mr. Huntley DecMabe's advance directive gone today.   Thanks Tanja PortBeatrice M Quadir Muns, [address]

## 2016-09-04 NOTE — Progress Notes (Signed)
Patient bladder scanned. Has more than 800cc . Nurse and NT did I & O and got 1400cc of urine. Patient voiced that he feels better. Thanked staff.

## 2016-09-04 NOTE — Clinical Social Work Note (Signed)
Clinical Social Work Assessment  Patient Details  Name: Mark Meadows MRN: [id number] Date of Birth: [birth year]  Date of referral:  09/04/16               Reason for consult:  Discharge Planning                Permission sought to share information with:  Family Supports Permission granted to share information::  Yes, Verbal Permission Granted  Name::     Opal Sidles  Agency::     Relationship::  sister  Contact Information:  [phone]  Housing/Transportation Living arrangements for the past 2 months:  Single Family Home Source of Information:  Patient Patient Interpreter Needed:  None Criminal Activity/Legal Involvement Pertinent to Current Situation/Hospitalization:  No - Comment as needed Significant Relationships:  Other Family Members, Siblings Lives with:  Self Do you feel safe going back to the place where you live?  No Need for family participation in patient care:  Yes (Comment)  Care giving concerns:  No family/friends at the bedside. Patient stated that his family is in the [place] area and does not have family in [place]  Social Worker assessment / plan:  Holiday representative met patient at bedside to offer support and discuss patients needs at bedside. Patient stated that he lives at home by himself and he has fallen 4 times within the past couple of weeks.Patient stated he is agreeable to SNF placement and would prefer placement in [place]. CSW to complete necessary paperwork and initiate SNF search on his behalf. CSW to follow up with patient once bed offers are available. CSW remains available for support and to facilitate patient discharge needs once medically ready.  Employment status:  Retired Forensic scientist:  Medicare PT Recommendations:  [place] / Referral to community resources:  [place]  Patient/Family's Response to care: Patient verbalized appreciation and understanding for CSW role and involvement in care.  Patient agreeable with current discharge plan to SNF following discharge  Patient/Family's Understanding of and Emotional Response to Diagnosis, Current Treatment, and Prognosis:  Patient with good understanding of current medical state and limitations around most recent hospitalization. Patient is agreeable with SNF placement in hopes of transitioning  back home  Emotional Assessment Appearance:  Appears stated age Attitude/Demeanor/Rapport:  Other (slighlty confused) Affect (typically observed):  Pleasant, Other Orientation:  Oriented to Self, Oriented to  Time, Oriented to Situation Alcohol / Substance use:  Tobacco Use Psych involvement (Current and /or in the community):  No (Comment)  Discharge Needs  Concerns to be addressed:  Discharge Planning Concerns, Substance Abuse Concerns Readmission within the last 30 days:  Yes Current discharge risk:  Substance Abuse Barriers to Discharge:  Unsafe home situation (patient lives at home)   Rhea Pink, MSW,  [place]

## 2016-09-04 NOTE — [place] FL2 (Signed)
[place] MEDICAID FL2 LEVEL OF CARE SCREENING TOOL     IDENTIFICATION  Patient Name: Mark Meadows Birthdate: 04-Apr-1951 Sex: male Admission Date (Current Location): 09/02/2016  [facility] [place]   Facility and Address:  [facility], [address], [place], [place] [address]      Provider Number: [id number]  Attending Physician Name and Address:  Leatha Gildingostin M Gherghe, MD  Relative Name and Phone Number:       Current Level of Care: Hospital Recommended Level of Care: Skilled Nursing Facility Prior Approval Number:    Date Approved/Denied:   PASRR Number:    Discharge Plan: SNF    Current Diagnoses: Patient Active Problem List   Diagnosis Date Noted  . Prolonged Q-T interval on ECG 09/03/2016  . Non-traumatic rhabdomyolysis 09/03/2016  . Effusion of left knee 09/03/2016  . Weakness 09/02/2016  . A-fib (HCC) 09/02/2016  . Hematochezia   . Rectal bleeding 08/22/2016  . Hypothyroidism 08/22/2016  . Alcoholism /alcohol abuse (HCC) 08/22/2016  . Alcoholic hepatitis 08/22/2016  . Hypokalemia 08/22/2016  . Hyperglycemia 08/22/2016  . Thrombocytopenia (HCC) 08/22/2016  . Acute blood loss anemia 08/22/2016  . Hypertension     Orientation RESPIRATION BLADDER Height & Weight     Self, Time, Situation  Normal Continent Weight: 221 lb 4.8 oz (100.4 kg) Height:  5\' 10"  (177.8 cm)  BEHAVIORAL SYMPTOMS/MOOD NEUROLOGICAL BOWEL NUTRITION STATUS      Incontinent Diet (Diet Heart)  AMBULATORY STATUS COMMUNICATION OF NEEDS Skin   Limited Assist Verbally Normal                       Personal Care Assistance Level of Assistance  Bathing, Feeding, Dressing Bathing Assistance: Limited assistance Feeding assistance: Independent Dressing Assistance: Limited assistance     Functional Limitations Info  Sight, Hearing, Speech Sight Info: Impaired Hearing Info: Adequate Speech Info: Adequate    SPECIAL CARE FACTORS FREQUENCY   PT (By licensed PT), OT (By licensed OT)     PT Frequency: 5x week OT Frequency: 5x week            Contractures Contractures Info: Not present    Additional Factors Info  Code Status Code Status Info: DNR             Current Medications (09/04/2016):  This is the current hospital active medication list Current Facility-Administered Medications  Medication Dose Route Frequency Provider Last Rate Last Dose  . 0.9 %  sodium chloride infusion  250 mL Intravenous PRN Jonah BlueJennifer Yates, MD      . acetaminophen (TYLENOL) tablet 650 mg  650 mg Oral Q4H PRN Jonah BlueJennifer Yates, MD      . aspirin chewable tablet 81 mg  81 mg Oral Daily Jonah BlueJennifer Yates, MD   81 mg at 09/04/16 0945  . colchicine tablet 0.6 mg  0.6 mg Oral Daily Leatha Gildingostin M Gherghe, MD   0.6 mg at 09/04/16 0945  . ferrous gluconate (FERGON) tablet 324 mg  324 mg Oral Q breakfast Leatha Gildingostin M Gherghe, MD   324 mg at 09/04/16 0945  . folic acid (FOLVITE) tablet 1 mg  1 mg Oral Daily Jonah BlueJennifer Yates, MD   1 mg at 09/04/16 0945  . insulin aspart (novoLOG) injection 0-9 Units  0-9 Units Subcutaneous TID WC Jonah BlueJennifer Yates, MD   1 Units at 09/03/16 0630  . lactated ringers infusion   Intravenous Continuous Leatha Gildingostin M Gherghe, MD 75 mL/hr at 09/04/16 [phone]    .  levothyroxine (SYNTHROID, LEVOTHROID) tablet 300 mcg  300 mcg Oral QAC breakfast Jonah BlueJennifer Yates, MD   300 mcg at 09/04/16 0535  . LORazepam (ATIVAN) tablet 1 mg  1 mg Oral Q6H PRN Jonah BlueJennifer Yates, MD       Or  . LORazepam (ATIVAN) injection 1 mg  1 mg Intravenous Q6H PRN Jonah BlueJennifer Yates, MD   1 mg at 09/04/16 0143  . metoprolol tartrate (LOPRESSOR) tablet 12.5 mg  12.5 mg Oral BID Jonah BlueJennifer Yates, MD   12.5 mg at 09/04/16 0945  . morphine 2 MG/ML injection 2 mg  2 mg Intravenous Q2H PRN Jonah BlueJennifer Yates, MD   2 mg at 09/04/16 0945  . multivitamin with minerals tablet 1 tablet  1 tablet Oral Daily Jonah BlueJennifer Yates, MD   1 tablet at 09/04/16 0945  . ondansetron (ZOFRAN) injection 4 mg  4 mg  Intravenous Q6H PRN Jonah BlueJennifer Yates, MD      . sodium chloride flush (NS) 0.9 % injection 3 mL  3 mL Intravenous Q12H Jonah BlueJennifer Yates, MD   3 mL at 09/02/16 2300  . sodium chloride flush (NS) 0.9 % injection 3 mL  3 mL Intravenous PRN Jonah BlueJennifer Yates, MD      . tamsulosin (FLOMAX) capsule 0.4 mg  0.4 mg Oral QPC supper Leatha Gildingostin M Gherghe, MD   0.4 mg at 09/03/16 1654  . thiamine (VITAMIN B-1) tablet 100 mg  100 mg Oral Daily Jonah BlueJennifer Yates, MD   100 mg at 09/04/16 0945     Discharge Medications: Please see discharge summary for a list of discharge medications.  Relevant Imaging Results:  Relevant Lab Results:   Additional Information SSN:[id number]  Althea CharonAshley C Alicha Raspberry, LCSW

## 2016-09-04 NOTE — Discharge Summary (Addendum)
Physician Discharge Summary  Mark Meadows ZOX:[id number] DOB: 07-09-1951 DOA: 09/02/2016  PCP: Elizabeth Palau, FNP  Admit date: 09/02/2016 Discharge date: 09/05/2016  Admitted From: home Disposition:  SNF  Recommendations for Outpatient Follow-up:  1. Follow up with PCP in 1-2 weeks 2. Please obtain BMP / CBC in 3-4 days  Discharge Condition: stable CODE STATUS: DNR Diet recommendation: regular  HPI: Mark Meadows is a 65 y.o. male with medical history significant of HTN, hypothyroidism, and alcohol dependence with recent hospitalization from 12/1-7 for progressive weakness and lethargy found to have severe hypothyroidism with failure to thrive.  He reports that during that hospitalization the nurses "treated me like a king."  When asked why he came in today, he reported that he has an old dog, wife died 10 years ago from cancer.  His dog tries to take care of him but he was too much for her.  Has a friend who said he would be willing to care for the dog.  Prior to coming in for admission last time, "it was the lowest days of my life."  Unable to walk, had to hold onto window sills and door jams because he was so sick. Recommended to be placed in SNF, encouraged to maximize home health but patient refused home health.  He does not remember not wanting either of those things. He reports that he went home and "felt wonderful".  He thinks the medication might have worn off.  He didn't get any prescriptions filled after discharge, "I didn't know I was supposed to."  Obviously, based on above, he is a poor historian.  He is also unaccompanied.  Hospital Course: Discharge Diagnoses:  Principal Problem:   Weakness Active Problems:   Hypothyroidism   Hypertension   Alcoholism /alcohol abuse (HCC)   Alcoholic hepatitis   Hypokalemia   Hyperglycemia   A-fib (HCC)   Prolonged Q-T interval on ECG   Non-traumatic rhabdomyolysis   Effusion of left knee   Weakness - due to alcoholism / B12  / folate deficiency. Patient was previously admitted and was recommended for SNF. He refused. He was then recommended for maximum home services. He refused. He went home and failed and was lying in the floor in his own feces for days. Now agreeable to SNF ? New-onset afib - on admission there was concern for A fib, however no evidence of that on EKG / telemetry  Malnutrition with vitamin B12 and folate deficiency - B12 im x 1 now, continue folic acid and B12 orally on d/c Alcoholism with hepatitis - Slightly improvement in AST/ALT, bilirubin stable Rhabdomyolysis - CK 1182 on admission, improved with IVF. Non-traumatic, likely from lying in the floor unable to move Hypothyroidism - TSH 32.635 (prior 62.857), continue PO Synthroid, recheck TSH in 3-4 weeks Left knee effusion - Likely related to fall, Tylenol / ibuprofen prn Urinary retention - due to poor mobility, keep foley in, continue Flomax, as patient becomes more mobile please do a voiding trial in 3-4 days   Discharge Instructions   Allergies as of 09/05/2016   No Known Allergies     Medication List    TAKE these medications   amLODipine 10 MG tablet Commonly known as:  NORVASC Take 1 tablet (10 mg total) by mouth daily.   folic acid 1 MG tablet Commonly known as:  FOLVITE Take 1 tablet (1 mg total) by mouth daily.   hydrochlorothiazide 25 MG tablet Commonly known as:  HYDRODIURIL Take 1 tablet (25 mg total) by  mouth daily.   hydrocortisone 25 MG suppository Commonly known as:  ANUSOL-HC Place 1 suppository (25 mg total) rectally 2 (two) times daily.   ibuprofen 200 MG tablet Commonly known as:  ADVIL,MOTRIN Take 400 mg by mouth every 6 (six) hours as needed.   levothyroxine 300 MCG tablet Commonly known as:  SYNTHROID, LEVOTHROID Take 1 tablet (300 mcg total) by mouth daily before breakfast.   metoprolol tartrate 25 MG tablet Commonly known as:  LOPRESSOR Take 0.5 tablets (12.5 mg total) by mouth 2 (two) times  daily.   pantoprazole 40 MG tablet Commonly known as:  PROTONIX Take 1 tablet (40 mg total) by mouth 2 (two) times daily.   tamsulosin 0.4 MG Caps capsule Commonly known as:  FLOMAX Take 1 capsule (0.4 mg total) by mouth daily after supper.   thiamine 100 MG tablet Take 1 tablet (100 mg total) by mouth daily.   vitamin B-12 1000 MCG tablet Commonly known as:  CYANOCOBALAMIN Take 1 tablet (1,000 mcg total) by mouth daily.       Contact information for follow-up providers    ANDERSON,TERESA, FNP. Schedule an appointment as soon as possible for a visit in 2 week(s).   Specialty:  Nurse Practitioner Contact information: [address] [place] [address] [phone]            Contact information for after-discharge care    Destination    [facility] .   Specialty:  Skilled Nursing Facility Contact information: [address] [place] [id number] [phone]                 No Known Allergies  Consultations:  None  Procedures/Studies:  Dg Chest 2 View  Result Date: 09/04/2016 CLINICAL DATA:  Initial evaluation for acute shortness of breath. EXAM: CHEST  2 VIEW COMPARISON:  Prior radiograph from 09/02/2016. FINDINGS: Cardiomegaly, stable from prior. Mediastinal silhouette within normal limits. Lungs mildly hypoinflated. Patchy density within the retrocardiac left lower lobe most compatible with atelectasis. Mild diffuse vascular congestion with increasing interstitial prominence, suggesting mild interstitial edema. No pleural effusion. No other focal infiltrates. No pneumothorax. Remotely healed left-sided rib fractures noted. No acute osseous abnormality. IMPRESSION: 1. Cardiomegaly with mild diffuse vascular congestion and interstitial prominence, suggesting mild pulmonary interstitial edema. 2. Patchy left basilar opacity, likely atelectasis, similar to previous. 3. Remotely healed left-sided rib  fractures. Electronically Signed   By: Rise MuBenjamin  McClintock M.D.   On: 09/04/2016 21:08   Dg Chest Portable 1 View  Result Date: 09/02/2016 CLINICAL DATA:  Weakness. EXAM: PORTABLE CHEST 1 VIEW COMPARISON:  None. FINDINGS: The heart is mildly enlarged. There is atelectasis at the left lung base. There is no evidence of pulmonary edema, consolidation, pneumothorax, nodule or pleural fluid. Some irregularity of left lower lateral ribs likely reflects old fractures. IMPRESSION: Left basilar atelectasis and mild cardiomegaly. Old left-sided rib fractures. Electronically Signed   By: Irish LackGlenn  Yamagata M.D.   On: 09/02/2016 16:30   Dg Knee Complete 4 Views Left  Result Date: 09/02/2016 CLINICAL DATA:  Acute left knee pain. EXAM: LEFT KNEE - COMPLETE 4+ VIEW COMPARISON:  None. FINDINGS: No evidence of fracture or dislocation. Moderate suprapatellar joint effusion is noted. No evidence of arthropathy or other focal bone abnormality. Soft tissues are unremarkable. IMPRESSION: Moderate size suprapatellar joint effusion. No fracture or dislocation is noted. Electronically Signed   By: Lupita RaiderJames  Green Jr, M.D.   On: 09/02/2016 19:10   [place] Abdomen  Limited Ruq  Result Date: 08/23/2016 CLINICAL DATA:  Alcoholic hepatitis. Elevated AST and total bilirubin. EXAM: US ABDOMEN LIMITED - RIGHT UPPER QUADRANT COMPARISON:  None. FINDINGS: Gallbladder: Tumefactive sludge and small gallstones in the gallbladder. The largest individual stone measures 5 mm in maximum diameter. No gallbladder wall thickening or pericholecystic fluid. The patient was not focally tender over the gallbladder. Common bile duct: Diameter: 4.5 mm Liver: Mildly echogenic.  Probably enlarged. IMPRESSION: 1. Cholelithiasis and sludge in the gallbladder without evidence of cholecystitis. 2. Mildly echogenic and probably enlarged liver. The increased echogenicity could be due to steatosis or chronic hepatitis. 3. No biliary obstruction. Electronically Signed    By: Beckie SaltsSteven  Reid M.D.   On: 08/23/2016 15:43    Subjective: - no chest pain, shortness of breath, no abdominal pain, nausea or vomiting.   Discharge Exam: Vitals:   09/04/16 2133 09/05/16 0434  BP: (!) 156/88 137/90  Pulse: 85 89  Resp: 20 18  Temp: 99.6 F (37.6 C) 98.2 F (36.8 C)   Vitals:   09/04/16 1238 09/04/16 1822 09/04/16 2133 09/05/16 0434  BP: (!) 164/98 (!) 173/100 (!) 156/88 137/90  Pulse: 90 91 85 89  Resp: 20 20 20 18   Temp:   99.6 F (37.6 C) 98.2 F (36.8 C)  TempSrc:   Axillary Oral  SpO2: 94% 92% 96% 99%  Weight:      Height:        General: Pt is alert, awake, not in acute distress Cardiovascular: RRR, S1/S2 +, no rubs, no gallops Respiratory: CTA bilaterally, no wheezing, no rhonchi Abdominal: Soft, NT, ND, bowel sounds + Extremities: no edema, no cyanosis    The results of significant diagnostics from this hospitalization (including imaging, microbiology, ancillary and laboratory) are listed below for reference.     Microbiology: No results found for this or any previous visit (from the past 240 hour(s)).   Labs: BNP (last 3 results) No results for input(s): BNP in the last 8760 hours. Basic Metabolic Panel:  Recent Labs Lab 09/02/16 1659 09/03/16 0254 09/04/16 0137  NA 138 143 140  K 3.2* 2.8* 3.2*  CL 103 107 106  CO2 27 26 25   GLUCOSE 141* 120* 96  BUN 14 16 13   CREATININE 0.90 0.89 0.86  CALCIUM 9.2 8.7* 8.3*  MG 1.8  --   --    Liver Function Tests:  Recent Labs Lab 09/02/16 1659 09/04/16 0137  AST 104* 112*  ALT 69* 62  ALKPHOS 126 112  BILITOT 2.8* 1.5*  PROT 8.3* 6.5  ALBUMIN 3.3* 2.4*    Recent Labs Lab 09/02/16 1659  LIPASE 18    Recent Labs Lab 09/02/16 1659  AMMONIA 14   CBC:  Recent Labs Lab 09/02/16 1659 09/03/16 0254 09/04/16 0137 09/04/16 0843  WBC 15.6* 13.7* 10.5  --   NEUTROABS 12.9*  --   --   --   HGB 9.4* 8.3* 7.4* 7.9*  HCT 27.6* 25.0* 22.8* 23.8*  MCV 116.5* 116.8* 117.5*   --   PLT 274 256 214  --    Cardiac Enzymes:  Recent Labs Lab 09/02/16 1659 09/02/16 2217 09/03/16 0254 09/03/16 0855 09/04/16 0137  CKTOTAL 1,182*  --   --   --  549*  TROPONINI  --  0.04* 0.05* 0.03*  --    BNP: Invalid input(s): POCBNP CBG:  Recent Labs Lab 09/04/16 0644 09/04/16 1124 09/04/16 1627 09/04/16 2130 09/05/16 0611  GLUCAP 92 83 101* 91 109*   D-Dimer  No results for input(s): DDIMER in the last 72 hours. Hgb A1c No results for input(s): HGBA1C in the last 72 hours. Lipid Profile  Recent Labs  09/03/16 0254  CHOL 159  HDL 26*  LDLCALC 110*  TRIG 117  CHOLHDL 6.1   Thyroid function studies  Recent Labs  09/02/16 1659  TSH 32.635*   Anemia work up No results for input(s): VITAMINB12, FOLATE, FERRITIN, TIBC, IRON, RETICCTPCT in the last 72 hours. Urinalysis    Component Value Date/Time   COLORURINE AMBER (A) 09/03/2016 0953   APPEARANCEUR CLEAR 09/03/2016 0953   LABSPEC 1.017 09/03/2016 0953   PHURINE 5.0 09/03/2016 0953   GLUCOSEU NEGATIVE 09/03/2016 0953   HGBUR MODERATE (A) 09/03/2016 0953   BILIRUBINUR NEGATIVE 09/03/2016 0953   KETONESUR NEGATIVE 09/03/2016 0953   PROTEINUR 100 (A) 09/03/2016 0953   NITRITE NEGATIVE 09/03/2016 0953   LEUKOCYTESUR NEGATIVE 09/03/2016 0953   Sepsis Labs Invalid input(s): PROCALCITONIN,  WBC,  LACTICIDVEN Microbiology No results found for this or any previous visit (from the past 240 hour(s)).   Time coordinating discharge: Over 30 minutes  SIGNED:  Pamella Pert, MD  [facility] 09/05/2016, 8:16 AM Pager [phone]  If 7PM-7AM, please contact night-coverage [email or web address] Password [email or web address]

## 2016-09-04 NOTE — Evaluation (Addendum)
Physical Therapy Evaluation Patient Details Name: Delena BaliJeffrey Arriaga MRN: [id number] DOB: [birth year] Today's Date: 09/04/2016   History of Present Illness  65 y.o. male admitted with FTT (was on floor at home for 3 days), L knee effusion (no fracture), new a fib, mild rhabdomyolysis. Recent hospitalization 12/1-12/7/17 with weakness, FTT, hypothyroid. PMH of EtOH.   Clinical Impression  Pt admitted with above diagnosis. Pt currently with functional limitations due to the deficits listed below (see PT Problem List). +2 total assist for rolling in bed, pt not able to tolerate further mobility 2* L knee pain, pain meds requested. Pt has some short term memory deficits, was unable to provide recent functional mobility history. SNF recommended.  Pt will benefit from skilled PT to increase their independence and safety with mobility to allow discharge to the venue listed below.       Follow Up Recommendations SNF;Supervision/Assistance - 24 hour    Equipment Recommendations  Rolling walker with 5" wheels    Recommendations for Other Services       Precautions / Restrictions Precautions Precautions: Fall Precaution Comments: info obtained from recent admission 08/28/16 as today pt stated he can't remember if he's had falls -Pt reports he started falling in Oct 2016 and broke ribs.  In March 2017, fell 4 x and broke ribs again.  Has had other falls since then not resulting in injuries.  Restrictions Weight Bearing Restrictions: No      Mobility  Bed Mobility Overal bed mobility: Needs Assistance Bed Mobility: Rolling Rolling: +2 for physical assistance;Total assist         General bed mobility comments: +2 total assist rolling for pericare, pt 5%, limited by L knee pain with movement  Transfers                    Ambulation/Gait                Stairs            Wheelchair Mobility    Modified Rankin (Stroke Patients Only)       Balance                                              Pertinent Vitals/Pain Pain Assessment: Faces Pain Score: 8  Pain Location: L knee Pain Descriptors / Indicators: Sore Pain Intervention(s): Limited activity within patient's tolerance;Monitored during session;Patient requesting pain meds-RN notified    Home Living Family/patient expects to be discharged to:: Skilled nursing facility Living Arrangements: Alone   Type of Home: House Home Access: Stairs to enter Entrance Stairs-Rails: Right;Left;Can reach both Entrance Stairs-Number of Steps: 4 Home Layout: One level Home Equipment: Cane - single point;Grab bars - tub/shower      Prior Function Level of Independence: Independent with assistive device(s)         Comments: prior to 08/22/16 hospitalization, [facility], during hospitalization he ambulated 300' with RW on 08/26/16. Pt stated he hasn't walked in 4 days, was on floor for 3 days PTA.      Hand Dominance        Extremity/Trunk Assessment   Upper Extremity Assessment Upper Extremity Assessment: Defer to OT evaluation    Lower Extremity Assessment Lower Extremity Assessment: LLE deficits/detail;RLE deficits/detail RLE Deficits / Details: tolerated AAROM L knee 10-90*, hip flexion to 90*, hip ABD to 20*,  grossly 3/5 LLE Deficits / Details: L knee pain with movement, sensation intact to L foot LLE: Unable to fully assess due to pain       Communication   Communication: No difficulties  Cognition Arousal/Alertness: Awake/alert Behavior During Therapy: WFL for tasks assessed/performed Overall Cognitive Status: Impaired/Different from baseline Area of Impairment: Memory     Memory: Decreased short-term memory         General Comments: pt oriented to year, self, location. Could not provide recent functional history, stated he didn't remember.     General Comments      Exercises General Exercises - Lower Extremity Heel Slides: AAROM;Right;10  reps;Supine Hip ABduction/ADduction: AAROM;Right;10 reps;Supine   Assessment/Plan    PT Assessment Patient needs continued PT services  PT Problem List Decreased strength;Decreased range of motion;Decreased activity tolerance;Decreased mobility;Pain;Decreased cognition          PT Treatment Interventions DME instruction;Gait training;Functional mobility training;Therapeutic activities;Therapeutic exercise;Balance training;Patient/family education    PT Goals (Current goals can be found in the Care Plan section)  Acute Rehab PT Goals Patient Stated Goal: agrees to ST-SNF PT Goal Formulation: With patient Time For Goal Achievement: 09/18/16 Potential to Achieve Goals: Fair    Frequency Min 3X/week   Barriers to discharge Decreased caregiver support      Co-evaluation               End of Session   Activity Tolerance: Patient limited by pain Patient left: in bed;with bed alarm set Nurse Communication: Mobility status;Need for lift equipment         Time: [phone] PT Time Calculation (min) (ACUTE ONLY): 15 min   Charges:   PT Evaluation $PT Eval Moderate Complexity: 1 Procedure     PT G Codes:        Tamala SerUhlenberg, Signora Zucco Kistler 09/04/2016, 9:51 AM [phone]

## 2016-09-04 NOTE — NC FL2 (Signed)
[place] MEDICAID FL2 LEVEL OF CARE SCREENING TOOL     IDENTIFICATION  Patient Name: Mark Meadows Birthdate: 1950-12-12 Sex: male Admission Date (Current Location): 09/02/2016  [facility] [place]   Facility and Address:  [facility] [place], [address], [place], [place] [address]      Provider Number: [id number]  Attending Physician Name and Address:  Leatha Gildingostin M Gherghe, MD  Relative Name and Phone Number:       Current Level of Care: Hospital Recommended Level of Care: Skilled Nursing Facility Prior Approval Number:    Date Approved/Denied:   PASRR Number: [id number]  Discharge Plan: SNF    Current Diagnoses: Patient Active Problem List   Diagnosis Date Noted  . Prolonged Q-T interval on ECG 09/03/2016  . Non-traumatic rhabdomyolysis 09/03/2016  . Effusion of left knee 09/03/2016  . Weakness 09/02/2016  . A-fib (HCC) 09/02/2016  . Hematochezia   . Rectal bleeding 08/22/2016  . Hypothyroidism 08/22/2016  . Alcoholism /alcohol abuse (HCC) 08/22/2016  . Alcoholic hepatitis 08/22/2016  . Hypokalemia 08/22/2016  . Hyperglycemia 08/22/2016  . Thrombocytopenia (HCC) 08/22/2016  . Acute blood loss anemia 08/22/2016  . Hypertension     Orientation RESPIRATION BLADDER Height & Weight     Self, Time, Situation  Normal Continent Weight: 221 lb 4.8 oz (100.4 kg) Height:  5\' 10"  (177.8 cm)  BEHAVIORAL SYMPTOMS/MOOD NEUROLOGICAL BOWEL NUTRITION STATUS      Incontinent Diet (Diet Heart)  AMBULATORY STATUS COMMUNICATION OF NEEDS Skin   Limited Assist Verbally Normal                       Personal Care Assistance Level of Assistance  Bathing, Feeding, Dressing Bathing Assistance: Limited assistance Feeding assistance: Independent Dressing Assistance: Limited assistance     Functional Limitations Info  Sight, Hearing, Speech Sight Info: Impaired Hearing Info: Adequate Speech Info: Adequate    SPECIAL CARE FACTORS  FREQUENCY  PT (By licensed PT), OT (By licensed OT)     PT Frequency: 5x week OT Frequency: 5x week            Contractures Contractures Info: Not present    Additional Factors Info  Code Status Code Status Info: DNR             Current Medications (09/04/2016):  This is the current hospital active medication list Current Facility-Administered Medications  Medication Dose Route Frequency Provider Last Rate Last Dose  . 0.9 %  sodium chloride infusion  250 mL Intravenous PRN Jonah BlueJennifer Yates, MD      . acetaminophen (TYLENOL) tablet 650 mg  650 mg Oral Q4H PRN Jonah BlueJennifer Yates, MD      . aspirin chewable tablet 81 mg  81 mg Oral Daily Jonah BlueJennifer Yates, MD   81 mg at 09/04/16 0945  . colchicine tablet 0.6 mg  0.6 mg Oral Daily Leatha Gildingostin M Gherghe, MD   0.6 mg at 09/04/16 0945  . ferrous gluconate (FERGON) tablet 324 mg  324 mg Oral Q breakfast Leatha Gildingostin M Gherghe, MD   324 mg at 09/04/16 0945  . folic acid (FOLVITE) tablet 1 mg  1 mg Oral Daily Jonah BlueJennifer Yates, MD   1 mg at 09/04/16 0945  . insulin aspart (novoLOG) injection 0-9 Units  0-9 Units Subcutaneous TID WC Jonah BlueJennifer Yates, MD   1 Units at 09/03/16 0630  . lactated ringers infusion   Intravenous Continuous Leatha Gildingostin M Gherghe, MD 75 mL/hr at 09/04/16 [phone]    .  levothyroxine (SYNTHROID, LEVOTHROID) tablet 300 mcg  300 mcg Oral QAC breakfast Jonah BlueJennifer Yates, MD   300 mcg at 09/04/16 0535  . LORazepam (ATIVAN) tablet 1 mg  1 mg Oral Q6H PRN Jonah BlueJennifer Yates, MD       Or  . LORazepam (ATIVAN) injection 1 mg  1 mg Intravenous Q6H PRN Jonah BlueJennifer Yates, MD   1 mg at 09/04/16 0143  . metoprolol tartrate (LOPRESSOR) tablet 12.5 mg  12.5 mg Oral BID Jonah BlueJennifer Yates, MD   12.5 mg at 09/04/16 0945  . morphine 2 MG/ML injection 2 mg  2 mg Intravenous Q2H PRN Jonah BlueJennifer Yates, MD   2 mg at 09/04/16 0945  . multivitamin with minerals tablet 1 tablet  1 tablet Oral Daily Jonah BlueJennifer Yates, MD   1 tablet at 09/04/16 0945  . ondansetron (ZOFRAN) injection 4 mg  4 mg  Intravenous Q6H PRN Jonah BlueJennifer Yates, MD      . sodium chloride flush (NS) 0.9 % injection 3 mL  3 mL Intravenous Q12H Jonah BlueJennifer Yates, MD   3 mL at 09/02/16 2300  . sodium chloride flush (NS) 0.9 % injection 3 mL  3 mL Intravenous PRN Jonah BlueJennifer Yates, MD      . tamsulosin (FLOMAX) capsule 0.4 mg  0.4 mg Oral QPC supper Leatha Gildingostin M Gherghe, MD   0.4 mg at 09/03/16 1654  . thiamine (VITAMIN B-1) tablet 100 mg  100 mg Oral Daily Jonah BlueJennifer Yates, MD   100 mg at 09/04/16 0945     Discharge Medications: Please see discharge summary for a list of discharge medications.  Relevant Imaging Results:  Relevant Lab Results:   Additional Information SSN:[id number]  Althea CharonAshley C Sandeep Delagarza, LCSW

## 2016-09-04 NOTE — Progress Notes (Signed)
Responded to page from Child psychotherapistocial Worker to assist patient with completing AD.  I spoke with patient nurse and she said patient was confused and did not feel that patient had a clue what he would be doing. I suggested that his nurse give form to patient for a later date to complete when medicine staff declare ready. Chaplain available as needed.  Pager [phone]

## 2016-09-04 NOTE — Progress Notes (Signed)
Clinical Social Worker facilitated patient discharge including contacting patient family and facility to confirm patient discharge plans.  Clinical information faxed to facility and family agreeable with plan.  CSW arranged ambulance transport via [facility] to [facility] .  RN Teresa to call [phone] for report prior to discharge.  Clinical Social Worker will sign off for now as social work intervention is no longer needed. Please consult us again if new need arises.  Mark Meadows, MSW, LCSWA [phone]  

## 2016-09-04 NOTE — Progress Notes (Signed)
PROGRESS NOTE  Mark Meadows ZOX:[id number] DOB: 06/17/1951 DOA: 09/02/2016 PCP: Elizabeth PalauANDERSON,TERESA, FNP   LOS: 2 days   Brief Narrative: Mark Meadows is a 65 y.o. male with medical history significant of HTN, hypothyroidism, and alcohol dependence with recent hospitalization from 12/1-7 for progressive weakness and lethargy found to have severe hypothyroidism with failure to thrive, was discharged home however was unable to walk or care for himself and was brought to the hospital. He was found to be in A. Fib. He also continues to drink alcohol. Of note, during his last hospitalization, patient refused SNF placement.   Assessment & Plan: Principal Problem:   Weakness Active Problems:   Hypothyroidism   Hypertension   Alcoholism /alcohol abuse (HCC)   Alcoholic hepatitis   Hypokalemia   Hyperglycemia   A-fib (HCC)   Prolonged Q-T interval on ECG   Non-traumatic rhabdomyolysis   Effusion of left knee   Weakness - Patient was previously admitted and was recommended for SNF.  He refused.  He was then recommended for maximum home services.  He refused.  He went home and failed and was lying in the floor in his own feces for days.  His family will have to call a home evacuation service to clean the home successfully per report. - patient ready for discharge today however became more altered per family, will cancel d/c and continue to monitor. Appears more SOB per family, repeat CXR  ? New-onset afib - This may be related to his ongoing hypothyroidism, ? On tele in ED, 12 lead without clear evidence - d/w cardiology, does not appear to be in a fib actually  Malnutrition with vitamin B12 and folate deficiency - B12 im x 1 now, continue folic acid  Alcoholism with hepatitis - Slightly improvement in AST/ALT from prior hospitalization with slight increase in hyperbilirubinemia - CIWA protocol - Encouraged cessation  Rhabdomyolysis - CK 1182 - Non-traumatic, likely from lying in the  floor unable to move - LR at 150 cc/hr >> decreased to 75 mL per hour  Hypothyroidism - TSH 32.635 (prior 62.857) - Will resume PO Synthroid  Hyperglycemia - A1c <5 on prior admission - Glucose >140 today - Will cover with SSI, no other intervention is indicated  Left knee effusion - Likely related to fall - Will follow - Tylenol prn with morphine as needed for severe pain   DVT prophylaxis: SCDs Code Status: DNR Family Communication: d/w sister and nephew bedside Disposition Plan: SNF when ready   Consultants:   Cardiology   Procedures:   None   Antimicrobials:  None    Subjective: - mild confusion, complaints of weakness otherwise he denies any chest pain, denies any shortness of breath, has no abdominal pain, no nausea, vomiting or diarrhea.  Objective: Vitals:   09/04/16 0646 09/04/16 0945 09/04/16 1238 09/04/16 1822  BP: (!) 157/100 (!) 155/94 (!) 164/98 (!) 173/100  Pulse: 98 89 90 91  Resp:   20 20  Temp: 98.6 F (37 C)     TempSrc: Oral     SpO2: 91%  94% 92%  Weight:      Height:       No intake or output data in the 24 hours ending 09/04/16 1901 Filed Weights   09/02/16 1920 09/02/16 2125  Weight: 105.2 kg (232 lb) 100.4 kg (221 lb 4.8 oz)    Examination: Constitutional: NAD Vitals:   09/04/16 0646 09/04/16 0945 09/04/16 1238 09/04/16 1822  BP: (!) 157/100 (!) 155/94 (!) 164/98 Marland Kitchen(!)  173/100  Pulse: 98 89 90 91  Resp:   20 20  Temp: 98.6 F (37 C)     TempSrc: Oral     SpO2: 91%  94% 92%  Weight:      Height:       Eyes: PERRL, + scleral icterus Respiratory: clear to auscultation bilaterally, no wheezing, no crackles. Normal respiratory effort. No accessory muscle use.  Cardiovascular: regular, no MRG Abdomen: no tenderness. Bowel sounds positive.  Musculoskeletal: no clubbing / cyanosis. Skin: ecchymosis left upper chest and left shoulder  Neurologic: CN 2-12 grossly intact. Strength 5/5 in all 4.  Psychiatric: Alert and  oriented x 2-3. Normal mood.    Data Reviewed: I have personally reviewed following labs and imaging studies  CBC:  Recent Labs Lab 09/02/16 1659 09/03/16 0254 09/04/16 0137 09/04/16 0843  WBC 15.6* 13.7* 10.5  --   NEUTROABS 12.9*  --   --   --   HGB 9.4* 8.3* 7.4* 7.9*  HCT 27.6* 25.0* 22.8* 23.8*  MCV 116.5* 116.8* 117.5*  --   PLT 274 256 214  --    Basic Metabolic Panel:  Recent Labs Lab 09/02/16 1659 09/03/16 0254 09/04/16 0137  NA 138 143 140  K 3.2* 2.8* 3.2*  CL 103 107 106  CO2 27 26 25   GLUCOSE 141* 120* 96  BUN 14 16 13   CREATININE 0.90 0.89 0.86  CALCIUM 9.2 8.7* 8.3*  MG 1.8  --   --    GFR: Estimated Creatinine Clearance: 100.4 mL/min (by C-G formula based on SCr of 0.86 mg/dL). Liver Function Tests:  Recent Labs Lab 09/02/16 1659 09/04/16 0137  AST 104* 112*  ALT 69* 62  ALKPHOS 126 112  BILITOT 2.8* 1.5*  PROT 8.3* 6.5  ALBUMIN 3.3* 2.4*    Recent Labs Lab 09/02/16 1659  LIPASE 18    Recent Labs Lab 09/02/16 1659  AMMONIA 14   Coagulation Profile:  Recent Labs Lab 09/02/16 1659 09/03/16 0254 09/04/16 0137  INR 1.04 1.07 1.14   Cardiac Enzymes:  Recent Labs Lab 09/02/16 1659 09/02/16 2217 09/03/16 0254 09/03/16 0855 09/04/16 0137  CKTOTAL 1,182*  --   --   --  549*  TROPONINI  --  0.04* 0.05* 0.03*  --    BNP (last 3 results) No results for input(s): PROBNP in the last 8760 hours. HbA1C: No results for input(s): HGBA1C in the last 72 hours. CBG:  Recent Labs Lab 09/03/16 1650 09/03/16 2242 09/04/16 0644 09/04/16 1124 09/04/16 1627  GLUCAP 88 99 92 83 101*   Lipid Profile:  Recent Labs  09/03/16 0254  CHOL 159  HDL 26*  LDLCALC 110*  TRIG 117  CHOLHDL 6.1   Thyroid Function Tests:  Recent Labs  09/02/16 1659  TSH 32.635*   Anemia Panel: No results for input(s): VITAMINB12, FOLATE, FERRITIN, TIBC, IRON, RETICCTPCT in the last 72 hours. Urine analysis:    Component Value Date/Time    COLORURINE AMBER (A) 09/03/2016 0953   APPEARANCEUR CLEAR 09/03/2016 0953   LABSPEC 1.017 09/03/2016 0953   PHURINE 5.0 09/03/2016 0953   GLUCOSEU NEGATIVE 09/03/2016 0953   HGBUR MODERATE (A) 09/03/2016 0953   BILIRUBINUR NEGATIVE 09/03/2016 0953   KETONESUR NEGATIVE 09/03/2016 0953   PROTEINUR 100 (A) 09/03/2016 0953   NITRITE NEGATIVE 09/03/2016 0953   LEUKOCYTESUR NEGATIVE 09/03/2016 0953   Sepsis Labs: Invalid input(s): PROCALCITONIN, LACTICIDVEN  No results found for this or any previous visit (from the past 240 hour(s)).  Radiology Studies: No results found.   Scheduled Meds: . aspirin  81 mg Oral Daily  . colchicine  0.6 mg Oral Daily  . ferrous gluconate  324 mg Oral Q breakfast  . folic acid  1 mg Oral Daily  . insulin aspart  0-9 Units Subcutaneous TID WC  . levothyroxine  300 mcg Oral QAC breakfast  . metoprolol tartrate  12.5 mg Oral BID  . multivitamin with minerals  1 tablet Oral Daily  . sodium chloride flush  3 mL Intravenous Q12H  . tamsulosin  0.4 mg Oral QPC supper  . thiamine  100 mg Oral Daily   Continuous Infusions: . lactated ringers 75 mL/hr at 09/04/16 0824    Pamella Pert, MD, PhD [facility] Pager [phone]  If 7PM-7AM, please contact night-coverage [email or web address] Password [email or web address] 09/04/2016, 7:01 PM

## 2016-09-04 NOTE — Clinical Social Work Placement (Signed)
   CLINICAL SOCIAL WORK PLACEMENT  NOTE  Date:  09/04/2016  Patient Details  Name: Mark Meadows MRN: [id number] Date of Birth: 1951-04-14  Clinical Social Work is seeking post-discharge placement for this patient at the Skilled  Nursing Facility level of care (*CSW will initial, date and re-position this form in  chart as items are completed):  Yes   Patient/family provided with [facility] Clinical Social Work Department's list of facilities offering this level of care within the geographic area requested by the patient (or if unable, by the patient's family).  Yes   Patient/family informed of their freedom to choose among providers that offer the needed level of care, that participate in Medicare, Medicaid or managed care program needed by the patient, have an available bed and are willing to accept the patient.  Yes   Patient/family informed of [facility]'s ownership interest in [facility] and [facility], as well as of the fact that they are under no obligation to receive care at these facilities.  PASRR submitted to EDS on       PASRR number received on       Existing PASRR number confirmed on       FL2 transmitted to all facilities in geographic area requested by pt/family on       FL2 transmitted to all facilities within larger geographic area on       Patient informed that his/her managed care company has contracts with or will negotiate with certain facilities, including the following:            Patient/family informed of bed offers received.  Patient chooses bed at       Physician recommends and patient chooses bed at      Patient to be transferred to   on  .  Patient to be transferred to facility by       Patient family notified on   of transfer.  Name of family member notified:        PHYSICIAN Please sign FL2     Additional Comment:    _______________________________________________ Althea CharonAshley C Erian Lariviere, LCSW 09/04/2016, 10:43 AM

## 2016-09-05 LAB — GLUCOSE, CAPILLARY: GLUCOSE-CAPILLARY: 109 mg/dL — AB (ref 65–99)

## 2016-09-05 NOTE — Progress Notes (Signed)
Patient did not discharge lastnight due to family miscommunication. Clinical Social Worker facilitated patient discharge including contacting patient family and facility to confirm patient discharge plans.  Clinical information faxed to facility and family agreeable with plan.  CSW arranged ambulance transport via [facility] to [facility] .  RN Rosey Batheresa to call [phone] for report prior to discharge.  Clinical Social Worker will sign off for now as social work intervention is no longer needed. Please consult us again if new need arises.  Marrianne MoodAshley Jojo Pehl, MSW, [employer]CSWA [phone]

## 2016-09-05 NOTE — Progress Notes (Signed)
Patient bladder scanned, 351cc noted. Will recheck  . Patient denies any discomfort.

## 2016-09-07 ENCOUNTER — Encounter: Payer: Self-pay | Admitting: Internal Medicine

## 2016-09-07 ENCOUNTER — Non-Acute Institutional Stay (SKILLED_NURSING_FACILITY): Payer: Medicare Other | Admitting: Internal Medicine

## 2016-09-07 DIAGNOSIS — IMO0001 Reserved for inherently not codable concepts without codable children: Secondary | ICD-10-CM

## 2016-09-07 DIAGNOSIS — K7010 Alcoholic hepatitis without ascites: Secondary | ICD-10-CM

## 2016-09-07 DIAGNOSIS — F1020 Alcohol dependence, uncomplicated: Secondary | ICD-10-CM | POA: Diagnosis not present

## 2016-09-07 DIAGNOSIS — D62 Acute posthemorrhagic anemia: Secondary | ICD-10-CM | POA: Diagnosis not present

## 2016-09-07 DIAGNOSIS — E038 Other specified hypothyroidism: Secondary | ICD-10-CM

## 2016-09-07 DIAGNOSIS — M25462 Effusion, left knee: Secondary | ICD-10-CM

## 2016-09-07 DIAGNOSIS — I10 Essential (primary) hypertension: Secondary | ICD-10-CM

## 2016-09-07 NOTE — Assessment & Plan Note (Signed)
09/07/16 hepatic enzymes are improving serially

## 2016-09-07 NOTE — Patient Instructions (Signed)
See Current Assessment & Plan in Problem List under specific Diagnosis 

## 2016-09-07 NOTE — Progress Notes (Signed)
This is a comprehensive admission note to [facility] performed on this date less than 30 days from date of admission. Included are preadmission medical/surgical history;reconciled medication list; family history; social history and comprehensive review of systems.  Corrections and additions to the records were documented . Comprehensive physical exam was also performed. Additionally a clinical summary was entered for each active diagnosis pertinent to this admission in the Problem List to enhance continuity of care.  PCP: Larita Fifehan Badger MD as per patient  HPI: The patient was hospitalized 12/12-12/15/2017 after he " felt (his) dog could no longer care for me". He exhibited failure to thrive requiring support with ambulation. The patient had been hospitalized 12/1-12/7/17 for progressive weakness and lethargy & was found to have severe hypothyroidism When he left the hospital 12/7 he did not fill the prescriptions for hypothyroidism. Apparently  On the day of the second admission he was found lying on the floor in his own feces He was found to have an irregular cardiac rhythm ;atrial fibrillation was suspected but not documented. He was in sinus rhythm with PACs. CK was 1182 on admission documenting rhabdomyolysis. Apparently he been lying on the floor for several days prior to admission. Repeat TSH was 32.64; the initial value during the first admission was 62.86. He feels he has gout of the knee, post trauma effusion was suspect. He was inserted for and laced on Flomax. In the context of gout it is noted and is on hydrochlorothiazide 25 mg daily. He was discharged on 300 g of thyroxine. He was to continue thiamine 100 mg daily as well as B12 1000 micrograms daily. He exhibited profoundly macrocytic anemia with an MCV of 117.5. Hemoglobin was as low as 7.4 & hematocrit 22. Prior to discharge the CK had dropped to 549. He was felt to have alcoholic hepatitis, AST peaked at 400.  Prior to discharge it was 112. The highest ALT was 132 62 ; it was normal at the time of discharge.  Past medical and surgical history: Includes thrombocytopenia, hypothyroidism, hypertension, hyperglycemia, dysrhythmia, alcohol dependence and alcoholic hepatitis.  Social history:While hospitalized he stated he drank at least 3 bottles of liquor or 48 ounces a week. Today he tells me he drinks 1 bottle per week. Apparently he quit smoking 1980.   Family history:No information was on the chart. Today he denies heart attack, stroke, cancer, diabetes in the family.   Review of systems: Review of systems was positive for intermittent diarrhea. He feels he has gout in the left knee and right ankle. The validity of his answers is questionable as he gave the date as 07/01/2016. He did name the president. He rambled on somewhat incoherently about "working at the lab down the street & I was driving by this facility and saw a lady in spirit nitrous eyes to get my mind right". He needed this "to reestablish".   Physical exam:  Pertinent or positive findings: when I entered the room he was in the wheelchair with his Foley bag on the floor in front of him. There was urine around the wheelchair. The urine in the bag was orange red.  By history the patient had been pulling his Foley while hospitalized. He has marked exotropia of the left eye. He states he still has 20/20 vision in that eye. He has pattern baldness. He has a full beard and mustache. Teeth are stained. Heart rhythm is irregular. Breath sounds are decreased. He has 1.5+ pitting of the ankles.  Pedal pulses are decreased. Biceps reflexes are 1.5+, knee reflexes are 0-1/2+. He has diffuse bruising over the forearms.   General appearance:Adequately nourished; no acute distress , increased work of breathing is present.   Lymphatic: No lymphadenopathy about the head, neck, axilla . Eyes: No conjunctival inflammation or lid edema is present. There is no  scleral icterus. Ears:  External ear exam shows no significant lesions or deformities.   Nose:  External nasal examination shows no deformity or inflammation. Nasal mucosa are pink and moist without lesions ,exudates Oral exam: lips and gums are healthy appearing.There is no oropharyngeal erythema or exudate . Neck:  No thyromegaly, masses, tenderness noted.    Heart:  No gallop, murmur, click, rub .  Lungs:Chest clear to auscultation without wheezes, rhonchi,rales , rubs. Abdomen:Bowel sounds are normal. Abdomen is soft and nontender with no organomegaly, hernias,masses. GU: deferred . Extremities:  No cyanosis Neurologic exam : Strength equal  in upper & lower extremities Balance,Rhomberg,finger to nose testing could not be completed due to clinical state Skin: Warm & dry w/o tenting. No significant lesions or rash.  See clinical summary under each active problem in the Problem List with associated updated therapeutic plan

## 2016-09-07 NOTE — Assessment & Plan Note (Signed)
09/07/16 he exhibits confabulation and confusion. Librium will be initiated as per alcohol withdrawal protocol

## 2016-09-07 NOTE — Assessment & Plan Note (Signed)
Well/17/17 TSH is improving, worrisome is the rather high dose of L-thyroxine of 300 g. This will need to be decreased with subsequent slower titration of dosage if he exhibits significant dysrhythmias .

## 2016-09-07 NOTE — Assessment & Plan Note (Addendum)
BP controlled; but change in antihypertensive medications indicated if he does have gout. HCTZ would exacerbate such. Pending uric acid level, spironolactone will be substituted for the HCTZ.

## 2016-09-07 NOTE — Assessment & Plan Note (Signed)
09/07/16 anemia is multi-factorial as he has history of GI blood loss but has macrocytosis in the context of alcohol abuse

## 2016-09-07 NOTE — Assessment & Plan Note (Signed)
09/07/16 patient feels he has gout, clinical impression was post traumatic effusion. Uric acid will be checked and HCTZ discontinued if uric acid is elevated as suspect

## 2016-09-08 LAB — CBC AND DIFFERENTIAL
HCT: 25 % — AB (ref 41–53)
HEMOGLOBIN: 8.8 g/dL — AB (ref 13.5–17.5)
Platelets: 303 10*3/uL (ref 150–399)
WBC: 11.5 10*3/mL

## 2016-09-08 LAB — BASIC METABOLIC PANEL
BUN: 24 mg/dL — AB (ref 4–21)
CREATININE: 1.1 mg/dL (ref 0.6–1.3)
GLUCOSE: 68 mg/dL
POTASSIUM: 3.4 mmol/L (ref 3.4–5.3)
Sodium: 141 mmol/L (ref 137–147)

## 2016-09-09 ENCOUNTER — Encounter: Payer: Self-pay | Admitting: *Deleted

## 2016-09-09 LAB — CBC AND DIFFERENTIAL
HEMATOCRIT: 27 % — AB (ref 41–53)
Hemoglobin: 9.2 g/dL — AB (ref 13.5–17.5)
PLATELETS: 315 10*3/uL (ref 150–399)
WBC: 11.9 10^3/mL

## 2016-09-09 LAB — BASIC METABOLIC PANEL
BUN: 26 mg/dL — AB (ref 4–21)
Creatinine: 1 mg/dL (ref 0.6–1.3)
GLUCOSE: 118 mg/dL
Potassium: 3.3 mmol/L — AB (ref 3.4–5.3)
Sodium: 139 mmol/L (ref 137–147)

## 2016-09-17 ENCOUNTER — Emergency Department (HOSPITAL_COMMUNITY): Payer: Medicare Other

## 2016-09-17 ENCOUNTER — Encounter (HOSPITAL_COMMUNITY): Payer: Self-pay | Admitting: Neurology

## 2016-09-17 ENCOUNTER — Inpatient Hospital Stay (HOSPITAL_COMMUNITY)
Admission: EM | Admit: 2016-09-17 | Discharge: 2016-09-23 | DRG: 871 | Disposition: A | Discharge status: Skilled Nursing Facility | Payer: Medicare Other | Attending: Internal Medicine | Admitting: Internal Medicine

## 2016-09-17 ENCOUNTER — Inpatient Hospital Stay (HOSPITAL_COMMUNITY): Payer: Medicare Other

## 2016-09-17 ENCOUNTER — Non-Acute Institutional Stay (SKILLED_NURSING_FACILITY): Payer: Medicare Other | Admitting: Nurse Practitioner

## 2016-09-17 ENCOUNTER — Encounter: Payer: Self-pay | Admitting: Nurse Practitioner

## 2016-09-17 DIAGNOSIS — I509 Heart failure, unspecified: Secondary | ICD-10-CM | POA: Diagnosis present

## 2016-09-17 DIAGNOSIS — R4182 Altered mental status, unspecified: Secondary | ICD-10-CM

## 2016-09-17 DIAGNOSIS — N390 Urinary tract infection, site not specified: Secondary | ICD-10-CM | POA: Diagnosis present

## 2016-09-17 DIAGNOSIS — E039 Hypothyroidism, unspecified: Secondary | ICD-10-CM | POA: Diagnosis present

## 2016-09-17 DIAGNOSIS — F0390 Unspecified dementia without behavioral disturbance: Secondary | ICD-10-CM | POA: Diagnosis present

## 2016-09-17 DIAGNOSIS — F1020 Alcohol dependence, uncomplicated: Secondary | ICD-10-CM | POA: Diagnosis present

## 2016-09-17 DIAGNOSIS — I110 Hypertensive heart disease with heart failure: Secondary | ICD-10-CM | POA: Diagnosis present

## 2016-09-17 DIAGNOSIS — I10 Essential (primary) hypertension: Secondary | ICD-10-CM | POA: Diagnosis present

## 2016-09-17 DIAGNOSIS — Z7189 Other specified counseling: Secondary | ICD-10-CM

## 2016-09-17 DIAGNOSIS — R6520 Severe sepsis without septic shock: Secondary | ICD-10-CM | POA: Diagnosis present

## 2016-09-17 DIAGNOSIS — W19XXXA Unspecified fall, initial encounter: Secondary | ICD-10-CM | POA: Diagnosis present

## 2016-09-17 DIAGNOSIS — E876 Hypokalemia: Secondary | ICD-10-CM | POA: Diagnosis present

## 2016-09-17 DIAGNOSIS — Z87891 Personal history of nicotine dependence: Secondary | ICD-10-CM

## 2016-09-17 DIAGNOSIS — R0902 Hypoxemia: Secondary | ICD-10-CM

## 2016-09-17 DIAGNOSIS — N179 Acute kidney failure, unspecified: Secondary | ICD-10-CM | POA: Diagnosis present

## 2016-09-17 DIAGNOSIS — E538 Deficiency of other specified B group vitamins: Secondary | ICD-10-CM | POA: Diagnosis present

## 2016-09-17 DIAGNOSIS — K219 Gastro-esophageal reflux disease without esophagitis: Secondary | ICD-10-CM | POA: Diagnosis present

## 2016-09-17 DIAGNOSIS — R748 Abnormal levels of other serum enzymes: Secondary | ICD-10-CM | POA: Diagnosis present

## 2016-09-17 DIAGNOSIS — R2231 Localized swelling, mass and lump, right upper limb: Secondary | ICD-10-CM | POA: Diagnosis not present

## 2016-09-17 DIAGNOSIS — D529 Folate deficiency anemia, unspecified: Secondary | ICD-10-CM | POA: Diagnosis present

## 2016-09-17 DIAGNOSIS — G92 Toxic encephalopathy: Secondary | ICD-10-CM | POA: Diagnosis present

## 2016-09-17 DIAGNOSIS — M25562 Pain in left knee: Secondary | ICD-10-CM | POA: Diagnosis not present

## 2016-09-17 DIAGNOSIS — M109 Gout, unspecified: Secondary | ICD-10-CM | POA: Diagnosis present

## 2016-09-17 DIAGNOSIS — J9601 Acute respiratory failure with hypoxia: Secondary | ICD-10-CM | POA: Diagnosis present

## 2016-09-17 DIAGNOSIS — A419 Sepsis, unspecified organism: Secondary | ICD-10-CM

## 2016-09-17 DIAGNOSIS — D520 Dietary folate deficiency anemia: Secondary | ICD-10-CM | POA: Diagnosis present

## 2016-09-17 DIAGNOSIS — G9340 Encephalopathy, unspecified: Secondary | ICD-10-CM | POA: Diagnosis not present

## 2016-09-17 DIAGNOSIS — M79644 Pain in right finger(s): Secondary | ICD-10-CM | POA: Diagnosis not present

## 2016-09-17 DIAGNOSIS — B9620 Unspecified Escherichia coli [E. coli] as the cause of diseases classified elsewhere: Secondary | ICD-10-CM

## 2016-09-17 DIAGNOSIS — K7010 Alcoholic hepatitis without ascites: Secondary | ICD-10-CM | POA: Diagnosis present

## 2016-09-17 DIAGNOSIS — R7989 Other specified abnormal findings of blood chemistry: Secondary | ICD-10-CM

## 2016-09-17 DIAGNOSIS — Z66 Do not resuscitate: Secondary | ICD-10-CM | POA: Diagnosis present

## 2016-09-17 DIAGNOSIS — E870 Hyperosmolality and hypernatremia: Secondary | ICD-10-CM | POA: Diagnosis present

## 2016-09-17 DIAGNOSIS — R7881 Bacteremia: Secondary | ICD-10-CM | POA: Diagnosis not present

## 2016-09-17 DIAGNOSIS — A4151 Sepsis due to Escherichia coli [E. coli]: Principal | ICD-10-CM | POA: Diagnosis present

## 2016-09-17 DIAGNOSIS — Z515 Encounter for palliative care: Secondary | ICD-10-CM

## 2016-09-17 DIAGNOSIS — R609 Edema, unspecified: Secondary | ICD-10-CM

## 2016-09-17 DIAGNOSIS — I4891 Unspecified atrial fibrillation: Secondary | ICD-10-CM | POA: Diagnosis present

## 2016-09-17 DIAGNOSIS — K7460 Unspecified cirrhosis of liver: Secondary | ICD-10-CM | POA: Diagnosis present

## 2016-09-17 DIAGNOSIS — R778 Other specified abnormalities of plasma proteins: Secondary | ICD-10-CM

## 2016-09-17 DIAGNOSIS — Z79899 Other long term (current) drug therapy: Secondary | ICD-10-CM

## 2016-09-17 LAB — I-STAT VENOUS BLOOD GAS, ED
Acid-base deficit: 2 mmol/L (ref 0.0–2.0)
BICARBONATE: 20.4 mmol/L (ref 20.0–28.0)
O2 Saturation: 93 %
PO2 VEN: 57 mmHg — AB (ref 32.0–45.0)
TCO2: 21 mmol/L (ref 0–100)
pCO2, Ven: 25 mmHg — ABNORMAL LOW (ref 44.0–60.0)
pH, Ven: 7.52 — ABNORMAL HIGH (ref 7.250–7.430)

## 2016-09-17 LAB — URINALYSIS, ROUTINE W REFLEX MICROSCOPIC
GLUCOSE, UA: NEGATIVE mg/dL
Ketones, ur: NEGATIVE mg/dL
Nitrite: NEGATIVE
Protein, ur: 100 mg/dL — AB
SPECIFIC GRAVITY, URINE: 1.018 (ref 1.005–1.030)
pH: 5 (ref 5.0–8.0)

## 2016-09-17 LAB — COMPREHENSIVE METABOLIC PANEL
ALT: 41 U/L (ref 17–63)
AST: 115 U/L — AB (ref 15–41)
Albumin: 2.3 g/dL — ABNORMAL LOW (ref 3.5–5.0)
Alkaline Phosphatase: 329 U/L — ABNORMAL HIGH (ref 38–126)
Anion gap: 14 (ref 5–15)
BILIRUBIN TOTAL: 1.4 mg/dL — AB (ref 0.3–1.2)
BUN: 14 mg/dL (ref 6–20)
CALCIUM: 8.4 mg/dL — AB (ref 8.9–10.3)
CO2: 21 mmol/L — ABNORMAL LOW (ref 22–32)
CREATININE: 1.77 mg/dL — AB (ref 0.61–1.24)
Chloride: 106 mmol/L (ref 101–111)
GFR calc Af Amer: 44 mL/min — ABNORMAL LOW (ref >60)
GFR, EST NON AFRICAN AMERICAN: 38 mL/min — AB (ref >60)
Glucose, Bld: 109 mg/dL — ABNORMAL HIGH (ref 65–99)
POTASSIUM: 2.9 mmol/L — AB (ref 3.5–5.1)
Sodium: 141 mmol/L (ref 135–145)
TOTAL PROTEIN: 6.7 g/dL (ref 6.5–8.1)

## 2016-09-17 LAB — CBC WITH DIFFERENTIAL/PLATELET
BASOS ABS: 0 10*3/uL (ref 0.0–0.1)
Basophils Relative: 0 %
Eosinophils Absolute: 0 10*3/uL (ref 0.0–0.7)
Eosinophils Relative: 0 %
HCT: 28.5 % — ABNORMAL LOW (ref 39.0–52.0)
Hemoglobin: 9.3 g/dL — ABNORMAL LOW (ref 13.0–17.0)
LYMPHS ABS: 0.3 10*3/uL — AB (ref 0.7–4.0)
Lymphocytes Relative: 2 %
MCH: 36.3 pg — ABNORMAL HIGH (ref 26.0–34.0)
MCHC: 32.6 g/dL (ref 30.0–36.0)
MCV: 111.3 fL — ABNORMAL HIGH (ref 78.0–100.0)
MONO ABS: 0.1 10*3/uL (ref 0.1–1.0)
MONOS PCT: 1 %
Neutro Abs: 13.2 10*3/uL — ABNORMAL HIGH (ref 1.7–7.7)
Neutrophils Relative %: 97 %
PLATELETS: 240 10*3/uL (ref 150–400)
RBC: 2.56 MIL/uL — AB (ref 4.22–5.81)
RDW: 14.6 % (ref 11.5–15.5)
WBC: 13.6 10*3/uL — AB (ref 4.0–10.5)

## 2016-09-17 LAB — TROPONIN I
TROPONIN I: 0.06 ng/mL — AB (ref <0.03)
Troponin I: 0.06 ng/mL (ref <0.03)

## 2016-09-17 LAB — I-STAT CG4 LACTIC ACID, ED
LACTIC ACID, VENOUS: 5.65 mmol/L — AB (ref 0.5–1.9)
LACTIC ACID, VENOUS: 3.49 mmol/L — AB (ref 0.5–1.9)

## 2016-09-17 LAB — PROTIME-INR
INR: 1.3
INR: 1.28
PROTHROMBIN TIME: 16.1 s — AB (ref 11.4–15.2)
Prothrombin Time: 16.3 seconds — ABNORMAL HIGH (ref 11.4–15.2)

## 2016-09-17 LAB — INFLUENZA PANEL BY PCR (TYPE A & B)
Influenza A By PCR: NEGATIVE
Influenza B By PCR: NEGATIVE

## 2016-09-17 LAB — PROCALCITONIN: Procalcitonin: 112.02 ng/mL

## 2016-09-17 LAB — APTT
aPTT: 30 seconds (ref 24–36)
aPTT: 31 seconds (ref 24–36)

## 2016-09-17 LAB — BRAIN NATRIURETIC PEPTIDE: B NATRIURETIC PEPTIDE 5: 363.5 pg/mL — AB (ref 0.0–100.0)

## 2016-09-17 LAB — LIPASE, BLOOD: Lipase: 18 U/L (ref 11–51)

## 2016-09-17 MED ORDER — ACETAMINOPHEN 650 MG RE SUPP: 650.0000 mg | Freq: Four times a day (QID) | RECTAL | Status: DC | PRN

## 2016-09-17 MED ORDER — SODIUM CHLORIDE 0.9 % IV BOLUS (SEPSIS)
1000.0000 mL | Freq: Once | INTRAVENOUS | Status: AC
  Administered 2016-09-17: 1000 mL via INTRAVENOUS

## 2016-09-17 MED ORDER — HYDROCORTISONE ACETATE 25 MG RE SUPP: 25.0000 mg | Freq: Two times a day (BID) | RECTAL | Status: DC | PRN

## 2016-09-17 MED ORDER — HYDROCORTISONE NA SUCCINATE PF 100 MG IJ SOLR
50.0000 mg | Freq: Four times a day (QID) | INTRAMUSCULAR | Status: DC
  Administered 2016-09-18 – 2016-09-19 (×8): 50 mg via INTRAVENOUS
  Filled 2016-09-17 (×8): qty 2

## 2016-09-17 MED ORDER — SODIUM CHLORIDE 0.9 % IV SOLN
Freq: Once | INTRAVENOUS | Status: AC
  Administered 2016-09-17: 12:00:00 via INTRAVENOUS
  Filled 2016-09-17: qty 1000

## 2016-09-17 MED ORDER — MAGNESIUM SULFATE 2 GM/50ML IV SOLN
2.0000 g | Freq: Once | INTRAVENOUS | Status: AC
  Administered 2016-09-17: 2 g via INTRAVENOUS
  Filled 2016-09-17 (×2): qty 50

## 2016-09-17 MED ORDER — PIPERACILLIN-TAZOBACTAM 3.375 G IVPB: 3.3750 g | Freq: Three times a day (TID) | INTRAVENOUS | Status: DC

## 2016-09-17 MED ORDER — ONDANSETRON HCL 4 MG/2ML IJ SOLN: 4.0000 mg | Freq: Four times a day (QID) | INTRAMUSCULAR | Status: DC | PRN

## 2016-09-17 MED ORDER — HYDROCORTISONE NA SUCCINATE PF 100 MG IJ SOLR
100.0000 mg | Freq: Once | INTRAMUSCULAR | Status: AC
  Administered 2016-09-17: 100 mg via INTRAVENOUS
  Filled 2016-09-17: qty 2

## 2016-09-17 MED ORDER — LEVOTHYROXINE SODIUM 100 MCG PO TABS
300.0000 ug | ORAL_TABLET | Freq: Every day | ORAL | Status: DC
  Administered 2016-09-18 – 2016-09-23 (×6): 300 ug via ORAL
  Filled 2016-09-17 (×5): qty 3
  Filled 2016-09-17: qty 2
  Filled 2016-09-17: qty 3

## 2016-09-17 MED ORDER — VANCOMYCIN HCL 10 G IV SOLR
1500.0000 mg | Freq: Once | INTRAVENOUS | Status: AC
  Administered 2016-09-17: 1500 mg via INTRAVENOUS
  Filled 2016-09-17: qty 1500

## 2016-09-17 MED ORDER — DEXTROSE 5 % IV SOLN
1.0000 g | INTRAVENOUS | Status: DC
  Administered 2016-09-18: 1 g via INTRAVENOUS
  Filled 2016-09-17: qty 1

## 2016-09-17 MED ORDER — PIPERACILLIN-TAZOBACTAM 3.375 G IVPB 30 MIN
3.3750 g | Freq: Once | INTRAVENOUS | Status: AC
  Administered 2016-09-17: 3.375 g via INTRAVENOUS
  Filled 2016-09-17: qty 50

## 2016-09-17 MED ORDER — HEPARIN SODIUM (PORCINE) 5000 UNIT/ML IJ SOLN
5000.0000 [IU] | Freq: Three times a day (TID) | INTRAMUSCULAR | Status: DC
  Administered 2016-09-18 – 2016-09-23 (×17): 5000 [IU] via SUBCUTANEOUS
  Filled 2016-09-17 (×16): qty 1

## 2016-09-17 MED ORDER — SODIUM CHLORIDE 0.9 % IV BOLUS (SEPSIS)
500.0000 mL | Freq: Once | INTRAVENOUS | Status: AC
  Administered 2016-09-17: 500 mL via INTRAVENOUS

## 2016-09-17 MED ORDER — VANCOMYCIN HCL IN DEXTROSE 1-5 GM/200ML-% IV SOLN: 1000.0000 mg | Freq: Once | INTRAVENOUS | Status: DC

## 2016-09-17 MED ORDER — ACETAMINOPHEN 650 MG RE SUPP
975.0000 mg | RECTAL | Status: AC
  Administered 2016-09-17: 10:00:00 975 mg via RECTAL
  Filled 2016-09-17: qty 1

## 2016-09-17 MED ORDER — DEXTROSE 5 % IV SOLN
2.0000 g | Freq: Once | INTRAVENOUS | Status: AC
  Administered 2016-09-17: 2 g via INTRAVENOUS
  Filled 2016-09-17: qty 2

## 2016-09-17 MED ORDER — ONDANSETRON HCL 4 MG PO TABS: 4.0000 mg | ORAL_TABLET | Freq: Four times a day (QID) | ORAL | Status: DC | PRN

## 2016-09-17 MED ORDER — ACETAMINOPHEN 325 MG PO TABS
650.0000 mg | ORAL_TABLET | Freq: Four times a day (QID) | ORAL | Status: DC | PRN
  Administered 2016-09-23: 650 mg via ORAL
  Filled 2016-09-17: qty 2

## 2016-09-17 MED ORDER — SODIUM CHLORIDE 0.9% FLUSH
3.0000 mL | Freq: Two times a day (BID) | INTRAVENOUS | Status: DC
  Administered 2016-09-17 – 2016-09-23 (×10): 3 mL via INTRAVENOUS

## 2016-09-17 MED ORDER — VANCOMYCIN HCL 10 G IV SOLR: 1250.0000 mg | INTRAVENOUS | Status: DC

## 2016-09-17 MED ORDER — SODIUM CHLORIDE 0.9 % IV SOLN
INTRAVENOUS | Status: DC
  Administered 2016-09-17 – 2016-09-18 (×2): via INTRAVENOUS

## 2016-09-17 NOTE — H&P (Signed)
History and Physical    Mark BaliJeffrey Jia ONG:[id number] DOB: 1950-12-23 DOA: 09/17/2016  PCP: Elizabeth PalauANDERSON,TERESA, FNP Patient coming from: SNF  Chief Complaint: somnolence  HPI: Mark Meadows is a 65 y.o. male with medical history significant of EtOH abuse, alcohol induced hepatitis, atrial fibrillation, GERD, hypertension, hypokalemia, thrombus cytopenia, hypothyroidism, gout, presenting from [facility] acutely encephalopathic and unable to provide reliable history other than "I don't feel well. "Level V caveat applies given patient's current condition. Discussed case with staff at nursing home Vivi Martens(Lindsey Fraiser), Chip Donald SivaStigall (brother-in-law), and left a message with his attorney, Montez HagemanDennis Boring at [phone].    ED Course: Objective findings outlined below. Sepsis protocol initiated with aggressive IV hydration and antibiotics.  Review of Systems: As per HPI otherwise 10 point review of systems negative.   Ambulatory Status:unkown  Past Medical History:  Diagnosis Date  . Acute blood loss anemia   . Alcohol dependence (HCC)   . Alcoholic hepatitis   . Dysrhythmia    "it doesn't move exactly the same; this is recent dx" (09/02/2016)  . GERD (gastroesophageal reflux disease)   . Gout   . Hematochezia   . Hyperglycemia   . Hypertension   . Hypokalemia   . Hypothyroidism   . Thrombocytopenia (HCC)     Past Surgical History:  Procedure Laterality Date  . COLONOSCOPY N/A 08/26/2016   Procedure: COLONOSCOPY;  Surgeon: Meryl DareMalcolm T Stark, MD;  [facility];  Service: Endoscopy;  Laterality: N/A;  . TONSILLECTOMY  1957    Social History   Social History  . Marital status: Widowed    Spouse name: N/A  . Number of children: N/A  . Years of education: N/A   Occupational History  . retired in 3/17    Social History Main Topics  . Smoking status: Former Smoker    Packs/day: 3.00    Years: 12.00    Types: Cigarettes    Quit date: 211980  . Smokeless tobacco:  Never Used  . Alcohol use 48.0 oz/week    80 Shots of liquor per week     Comment: 09/02/2016 "was at 3 large bottles of cheap bourbon/week; cut down to 2 bottles"  . Drug use:     Types: Marijuana     Comment: 09/02/2016 "tried marijuana 2 times; years ago"  . Sexual activity: Not Currently   Other Topics Concern  . Not on file   Social History Narrative  . No narrative on file    No Known Allergies  Family History  Problem Relation Age of Onset  . Family history unknown: Yes    Prior to Admission medications   Medication Sig Start Date End Date Taking? Authorizing Provider  amLODipine (NORVASC) 10 MG tablet Take 1 tablet (10 mg total) by mouth daily. 08/27/16  Yes Kathlen ModyVijaya Akula, MD  chlordiazePOXIDE (LIBRIUM) 10 MG capsule Take 10 mg by mouth every 6 (six) hours.   Yes Historical Provider, MD  cyanocobalamin (,VITAMIN B-12,) 1000 MCG/ML injection Inject 1,000 mcg into the muscle every Wednesday. Stop date 10/01/16   Yes Historical Provider, MD  folic acid (FOLVITE) 1 MG tablet Take 1 tablet (1 mg total) by mouth daily. 08/28/16  Yes Kathlen ModyVijaya Akula, MD  hydrocortisone (ANUSOL-HC) 25 MG suppository Place 25 mg rectally 2 (two) times daily as needed for hemorrhoids or itching.   Yes Historical Provider, MD  levothyroxine (SYNTHROID, LEVOTHROID) 300 MCG tablet Take 1 tablet (300 mcg total) by mouth daily before breakfast. 08/27/16  Yes Kathlen ModyVijaya Akula, MD  metoprolol tartrate (LOPRESSOR) 25 MG tablet Take 0.5 tablets (12.5 mg total) by mouth 2 (two) times daily. 08/27/16  Yes Kathlen ModyVijaya Akula, MD  pantoprazole (PROTONIX) 40 MG tablet Take 1 tablet (40 mg total) by mouth 2 (two) times daily. 08/27/16  Yes Kathlen ModyVijaya Akula, MD  spironolactone (ALDACTONE) 25 MG tablet Take 25 mg by mouth daily.   Yes Historical Provider, MD  thiamine 100 MG tablet Take 1 tablet (100 mg total) by mouth daily. 08/28/16  Yes Kathlen ModyVijaya Akula, MD  vitamin B-12 (CYANOCOBALAMIN) 1000 MCG tablet Take 1,000 mcg by mouth once a week.     Yes Historical Provider, MD  ibuprofen (ADVIL,MOTRIN) 200 MG tablet Take 400 mg by mouth every 6 (six) hours as needed (pain).     Historical Provider, MD    Physical Exam: Vitals:   09/17/16 1215 09/17/16 1230 09/17/16 1233 09/17/16 1425  BP: 93/62 (!) 87/57    Pulse: 97 89    Resp:   24   Temp:    100.9 F (38.3 C)  TempSrc:    Rectal  SpO2: 91% 92%    Weight:      Height:         General: Ill-appearing, resting in bed. Eyes:  PERRL, EOMI, normal lids, iris ENT: Very dry mucous membranes, normal hearing Neck:  no LAD, masses or thyromegaly Cardiovascular: irregularly regular. 1+ Bilat le edema. II/VI systolic murmur Respiratory: Diminished breath sounds at bases bilaterally, increased effort, few crackles.  Abdomen:  soft, ntnd, NABS Skin: Erythema and induration over the right second metacarpal Musculoskeletal:  grossly normal tone BUE/BLE, good ROM, no bony abnormality Psychiatric: Somnolent. Will answer basic questions with head nodding or simple yes or no responses. Neurologic:  CN 2-12 grossly intact, moves all extremities in coordinated fashion, sensation intact  Labs on Admission: I have personally reviewed following labs and imaging studies  CBC:  Recent Labs Lab 09/17/16 0749  WBC 13.6*  NEUTROABS 13.2*  HGB 9.3*  HCT 28.5*  MCV 111.3*  PLT 240   Basic Metabolic Panel:  Recent Labs Lab 09/17/16 0749  NA 141  K 2.9*  CL 106  CO2 21*  GLUCOSE 109*  BUN 14  CREATININE 1.77*  CALCIUM 8.4*   GFR: Estimated Creatinine Clearance: 49.6 mL/min (by C-G formula based on SCr of 1.77 mg/dL (H)). Liver Function Tests:  Recent Labs Lab 09/17/16 0749  AST 115*  ALT 41  ALKPHOS 329*  BILITOT 1.4*  PROT 6.7  ALBUMIN 2.3*    Recent Labs Lab 09/17/16 0749  LIPASE 18   No results for input(s): AMMONIA in the last 168 hours. Coagulation Profile:  Recent Labs Lab 09/17/16 0749  INR 1.30   Cardiac Enzymes:  Recent Labs Lab 09/17/16 0749    TROPONINI 0.06*   BNP (last 3 results) No results for input(s): PROBNP in the last 8760 hours. HbA1C: No results for input(s): HGBA1C in the last 72 hours. CBG: No results for input(s): GLUCAP in the last 168 hours. Lipid Profile: No results for input(s): CHOL, HDL, LDLCALC, TRIG, CHOLHDL, LDLDIRECT in the last 72 hours. Thyroid Function Tests: No results for input(s): TSH, T4TOTAL, FREET4, T3FREE, THYROIDAB in the last 72 hours. Anemia Panel: No results for input(s): VITAMINB12, FOLATE, FERRITIN, TIBC, IRON, RETICCTPCT in the last 72 hours. Urine analysis:    Component Value Date/Time   COLORURINE AMBER (A) 09/17/2016 1115   APPEARANCEUR TURBID (A) 09/17/2016 1115   LABSPEC 1.018 09/17/2016 1115   PHURINE 5.0 09/17/2016 1115  GLUCOSEU NEGATIVE 09/17/2016 1115   HGBUR SMALL (A) 09/17/2016 1115   BILIRUBINUR SMALL (A) 09/17/2016 1115   KETONESUR NEGATIVE 09/17/2016 1115   PROTEINUR 100 (A) 09/17/2016 1115   NITRITE NEGATIVE 09/17/2016 1115   LEUKOCYTESUR MODERATE (A) 09/17/2016 1115    Creatinine Clearance: Estimated Creatinine Clearance: 49.6 mL/min (by C-G formula based on SCr of 1.77 mg/dL (H)).  Sepsis Labs: @LABRCNTIP (procalcitonin:4,lacticidven:4) )No results found for this or any previous visit (from the past 240 hour(s)).   Radiological Exams on Admission: Ct Head Wo Contrast  Result Date: 09/17/2016 CLINICAL DATA:  Fall yesterday. Change in mental status. Initial encounter. EXAM: CT HEAD WITHOUT CONTRAST TECHNIQUE: Contiguous axial images were obtained from the base of the skull through the vertex without intravenous contrast. COMPARISON:  None. FINDINGS: Brain: No evidence of acute infarction, hemorrhage, hydrocephalus, extra-axial collection or mass lesion/mass effect. Age advanced atrophy and white matter disease attributed to chronic microvascular ischemia. Vascular: No hyperdense vessel. Prominent posterior circulation atherosclerotic plaque calcification.  Skull: No acute or posttraumatic finding. Sinuses/Orbits: Dysconjugate gaze, nonspecific. IMPRESSION: 1. No acute finding. 2. Age advanced atrophy and chronic microvascular disease. Electronically Signed   By: Marnee Spring M.D.   On: 09/17/2016 10:57   Dg Chest Port 1 View  Result Date: 09/17/2016 CLINICAL DATA:  Fever. EXAM: PORTABLE CHEST 1 VIEW COMPARISON:  09/04/2016 . FINDINGS: Cardiomegaly. Mild diffuse bilateral pulmonary interstitial prominence with small left pleural effusion. Mild CHF cannot be excluded. No pneumothorax. IMPRESSION: Cardiomegaly with mild diffuse bilateral from interstitial prominence of small left pleural effusion. Mild CHF cannot be excluded . Electronically Signed   By: Maisie Fus  Register   On: 09/17/2016 10:22    EKG: Independently reviewed. Sinus, tachy. pac  Assessment/Plan Active Problems:   Hypertension   Alcoholism /alcohol abuse (HCC)   Hypokalemia   Sepsis (HCC)   Localized swelling on right hand   Elevated troponin   Sepsis: Likely urologic etiology. Patient with recent admission with urinary retention. Patient removed catheter twice and nursing home due to it intolerance to catheter being in. - SDU - Sepsis orderset - continue Vanc/Zos. Narrow as cultures return - continue IVF due to hypotension. May need vasopressors.   Social: Patient has been a heavy drinker for the last 2-3 years after his wife died. Patient is in the process of transferring power of attorney over to his brother-in-law, [name]. Patient is DO NOT RESUSCITATE. When patient is more awake discussions need to be have with regards to goals of care and possible hospice - Palliative Care  ETOH abuse: heavy drinker for last few years, since wife's passing. Came from [facility] and pt obtunded so unsure of last ETOH use - CIWA - resume home Librium when awake and able  R hand swelling: No reported injury. Painful to palpation over the distal 2/3 of the 2nd Metacarpal w/  mild induration and surounding erythema. No bruising or bony abnormality. Early cellulitis vs fracture????  - DG Hand - ABX as above.   HypoK: 2.9. Suspect from malnutrition, renal insufficiency, and sepsis - KCL - MAg  Elevated trop: 0.06 on admission. Appears to be chronically elevated. EKG w/o ACS - trop x1 to ensure it isn't uptrending.   AKI: Cr 1.77. Baseline 0.9.  - IVF =- BMP in am  Anemia: 9.3 Hgb. Macrocytic. At baseline. Likely from poor Vitamin/nutrition intake and ETOH intake.  - anemia panel  Cirrhosis: ETOH related.  - when stable resume spironolactone - COnitnue MV, Thiamine, folate.   HTN:  hypotensive from sepsis - when stable resume Metop, Norvasc  Hypothyroidism - continue synthroid  GERD: - pepcid   DVT prophylaxis: Lovenox  Code Status: DNR  Family Communication: Brother in Social worker Disposition Plan: penidng improvement or change to comfort care Consults called: palliatve care  Admission status: inpt    Shavonte Zhao J MD [facility]  If 7PM-7AM, please contact night-coverage [email or web address] Password [email or web address]  09/17/2016, 3:10 PM

## 2016-09-17 NOTE — ED Notes (Signed)
[facility]ink calling to remind about lactic acid. Phlebotomy made aware and reports they will collect.

## 2016-09-17 NOTE — Progress Notes (Addendum)
  BP remains soft.  Continue current plan Stress dose steroids initiated.  Continue Aggressive IVF No vassopressors May need palliative care consult in am for GOC  Shelly Flattenavid Geoff Dacanay, MD [facility] 09/17/2016, 8:19 PM

## 2016-09-17 NOTE — ED Notes (Signed)
[facility] JanskyCarey Southern (Niece) [phone] Pt gives permission to call Iona HansenCarey as needed

## 2016-09-17 NOTE — ED Notes (Signed)
Pt has been seen by admitting MD, awaiting SDU placement. Pt is alert to command, IV fluids & antibiotics finished infusion.

## 2016-09-17 NOTE — ED Notes (Signed)
Dr. Konrad DoloresMerrell reports to increase NS with K rate to 150 ml/hr. This has been completed. Also reports pt is a DNR.

## 2016-09-17 NOTE — ED Provider Notes (Signed)
[facility]-EM[facility]ENCY DEPT Provider Note   CSN: [id number] Arrival date & time: 09/17/16  0940     History   Chief Complaint Chief Complaint  Patient presents with  . Altered Mental Status    HPI Mark Meadows is a 65 y.o. male. History is from EMS and nursing facility. Patient has altered mental status and cannot provide additional history.  HPI Patient is brought from nursing home for change in mental status. At baseline, patient is reportedly ambulatory with normal mental status. He reportedly had a fall yesterday without any associated head injury. This morning the patient was found in his bed somnolent, difficult to arouse with fever. Patient cannot provide any additional history. Past Medical History:  Diagnosis Date  . Acute blood loss anemia   . Alcohol dependence (HCC)   . Alcoholic hepatitis   . Dysrhythmia    "it doesn't move exactly the same; this is recent dx" (09/02/2016)  . GERD (gastroesophageal reflux disease)   . Gout   . Hematochezia   . Hyperglycemia   . Hypertension   . Hypokalemia   . Hypothyroidism   . Thrombocytopenia [facility](HCC)     Patient Active Problem List   Diagnosis Date Noted  . Sepsis (HCC) 09/17/2016  . Prolonged Q-T interval on ECG 09/03/2016  . Non-traumatic rhabdomyolysis 09/03/2016  . Effusion of left knee 09/03/2016  . Weakness 09/02/2016  . A-fib (HCC) 09/02/2016  . Hematochezia   . Rectal bleeding 08/22/2016  . Hypothyroidism 08/22/2016  . Alcoholism /alcohol abuse (HCC) 08/22/2016  . Alcoholic hepatitis 08/22/2016  . Hypokalemia 08/22/2016  . Hyperglycemia 08/22/2016  . Thrombocytopenia (HCC) 08/22/2016  . Acute blood loss anemia 08/22/2016  . Hypertension     Past Surgical History:  Procedure Laterality Date  . COLONOSCOPY N/A 08/26/2016   Procedure: COLONOSCOPY;  Surgeon: Meryl DareMalcolm T Stark, MD;  [facility];  Service: Endoscopy;  Laterality: N/A;  . TONSILLECTOMY  1957       Home Medications    Prior to  Admission medications   Medication Sig Start Date End Date Taking? Authorizing Provider  amLODipine (NORVASC) 10 MG tablet Take 1 tablet (10 mg total) by mouth daily. 08/27/16  Yes Kathlen ModyVijaya Akula, MD  chlordiazePOXIDE (LIBRIUM) 10 MG capsule Take 10 mg by mouth every 6 (six) hours.   Yes Historical Provider, MD  cyanocobalamin (,VITAMIN B-12,) 1000 MCG/ML injection Inject 1,000 mcg into the muscle every Wednesday. Stop date 10/01/16   Yes Historical Provider, MD  folic acid (FOLVITE) 1 MG tablet Take 1 tablet (1 mg total) by mouth daily. 08/28/16  Yes Kathlen ModyVijaya Akula, MD  hydrocortisone (ANUSOL-HC) 25 MG suppository Place 25 mg rectally 2 (two) times daily as needed for hemorrhoids or itching.   Yes Historical Provider, MD  levothyroxine (SYNTHROID, LEVOTHROID) 300 MCG tablet Take 1 tablet (300 mcg total) by mouth daily before breakfast. 08/27/16  Yes Kathlen ModyVijaya Akula, MD  metoprolol tartrate (LOPRESSOR) 25 MG tablet Take 0.5 tablets (12.5 mg total) by mouth 2 (two) times daily. 08/27/16  Yes Kathlen ModyVijaya Akula, MD  pantoprazole (PROTONIX) 40 MG tablet Take 1 tablet (40 mg total) by mouth 2 (two) times daily. 08/27/16  Yes Kathlen ModyVijaya Akula, MD  spironolactone (ALDACTONE) 25 MG tablet Take 25 mg by mouth daily.   Yes Historical Provider, MD  thiamine 100 MG tablet Take 1 tablet (100 mg total) by mouth daily. 08/28/16  Yes Kathlen ModyVijaya Akula, MD  vitamin B-12 (CYANOCOBALAMIN) 1000 MCG tablet Take 1,000 mcg by mouth once a week.  Yes Historical Provider, MD  ibuprofen (ADVIL,MOTRIN) 200 MG tablet Take 400 mg by mouth every 6 (six) hours as needed (pain).     Historical Provider, MD    Family History Family History  Problem Relation Age of Onset  . Family history unknown: Yes    Social History Social History  Substance Use Topics  . Smoking status: Former Smoker    Packs/day: 3.00    Years: 12.00    Types: Cigarettes    Quit date: [id number]  . Smokeless tobacco: Never Used  . Alcohol use 48.0 oz/week    80 Shots of liquor  per week     Comment: 09/02/2016 "was at 3 large bottles of cheap bourbon/week; cut down to 2 bottles"     Allergies   Patient has no known allergies.   Review of Systems Review of Systems Cannot obtain review of systems level V caveat altered mental status.  Physical Exam Updated Vital Signs BP 94/71   Pulse 97   Temp 101.9 F (38.8 C) (Rectal)   Resp (!) 28   Ht 5\' 10"  (1.778 m)   Wt 230 lb (104.3 kg)   SpO2 100%   BMI 33.00 kg/m   Physical Exam  Constitutional:  Patient is somnolent and confused. Mild tachypnea but maintaining airway without immediate respiratory distress. Patient can answer only very basic questions. Skin is hot to the touch.  HENT:  Head: Normocephalic and atraumatic.  Nose: Nose normal.  Left TM, cerumen impaction. Right TM retracted and dull. Mucous membranes dry, patient is mouth breathing. Posterior airway patent without any pooling of secretions. Respirations are not sonorous.  Eyes: Conjunctivae and EOM are normal. Pupils are equal, round, and reactive to light.  Neck: Neck supple.  Cardiovascular: Normal rate, regular rhythm and normal heart sounds.   Pedal pulses are 1+.  Pulmonary/Chest:  Mild tachypnea but no appearance of significant respiratory distress. Breath sounds are clear and symmetric.  Abdominal: Soft. Bowel sounds are normal. He exhibits no distension. There is no tenderness. There is no guarding.  Genitourinary:  Genitourinary Comments: Normal visual inspection of penis and scrotum. No cellulitis or scrotal edema.  Musculoskeletal:  Patient has 2+ pitting edema bilateral lower extremities. Skin is crenulated and scaling suggestive of previous more extensive edema. Minimal erythema of the feet. Thickening of toenails consistent with onychomycosis. Scaling skin on the soles. No flagrant cellulitis. No open wounds. Upper extremities have senile ecchymoses but general condition of upper extremities is good.  Neurological:  Patient  is very somnolent. He responds to very simple questions. He appears globally too weak to follow commands. No evident localizing neurologic deficit.  Skin:  Skin is hot and dry to the touch. There are no evident rashes. Back and buttocks, genitals abdomen and extremities are examined. Lower extremities have very slight erythema which could be consistent with peripheral edema. Skin exam does not show a clear area of source of sepsis.     ED Treatments / Results  Labs (all labs ordered are listed, but only abnormal results are displayed) Labs Reviewed  COMPREHENSIVE METABOLIC PANEL - Abnormal; Notable for the following:       Result Value   Potassium 2.9 (*)    CO2 21 (*)    Glucose, Bld 109 (*)    Creatinine, Ser 1.77 (*)    Calcium 8.4 (*)    Albumin 2.3 (*)    AST 115 (*)    Alkaline Phosphatase 329 (*)    Total Bilirubin 1.4 (*)  GFR calc non Af Amer 38 (*)    GFR calc Af Amer 44 (*)    All other components within normal limits  CBC WITH DIFFERENTIAL/PLATELET - Abnormal; Notable for the following:    WBC 13.6 (*)    RBC 2.56 (*)    Hemoglobin 9.3 (*)    HCT 28.5 (*)    MCV 111.3 (*)    MCH 36.3 (*)    Neutro Abs 13.2 (*)    Lymphs Abs 0.3 (*)    All other components within normal limits  BRAIN NATRIURETIC PEPTIDE - Abnormal; Notable for the following:    B Natriuretic Peptide 363.5 (*)    All other components within normal limits  TROPONIN I - Abnormal; Notable for the following:    Troponin I 0.06 (*)    All other components within normal limits  PROTIME-INR - Abnormal; Notable for the following:    Prothrombin Time 16.3 (*)    All other components within normal limits  I-STAT CG4 LACTIC ACID, ED - Abnormal; Notable for the following:    Lactic Acid, Venous 5.65 (*)    All other components within normal limits  I-STAT VENOUS BLOOD GAS, ED - Abnormal; Notable for the following:    pH, Ven 7.520 (*)    pCO2, Ven 25.0 (*)    pO2, Ven 57.0 (*)    All other components  within normal limits  CULTURE, BLOOD (ROUTINE X 2)  CULTURE, BLOOD (ROUTINE X 2)  URINE CULTURE  LIPASE, BLOOD  APTT  URINALYSIS, ROUTINE W REFLEX MICROSCOPIC  BLOOD GAS, VENOUS  INFLUENZA PANEL BY PCR (TYPE A & B, H1N1)    EKG  EKG Interpretation  Date/Time:  Wednesday September 17 2016 09:45:22 EST Ventricular Rate:  120 PR Interval:    QRS Duration: 104 QT Interval:  385 QTC Calculation: 544 R Axis:   93 Text Interpretation:  Sinus or ectopic atrial tachycardia Right axis deviation Baseline wander in lead(s) II III aVR aVL aVF V1 V2 V4 V5 V6 dynamic anterior leads c/w previous. no STEMI Confirmed by Donnald Garre, MD, Lebron Conners [phone]) on 09/17/2016 11:46:43 AM       Radiology Ct Head Wo Contrast  Result Date: 09/17/2016 CLINICAL DATA:  Fall yesterday. Change in mental status. Initial encounter. EXAM: CT HEAD WITHOUT CONTRAST TECHNIQUE: Contiguous axial images were obtained from the base of the skull through the vertex without intravenous contrast. COMPARISON:  None. FINDINGS: Brain: No evidence of acute infarction, hemorrhage, hydrocephalus, extra-axial collection or mass lesion/mass effect. Age advanced atrophy and white matter disease attributed to chronic microvascular ischemia. Vascular: No hyperdense vessel. Prominent posterior circulation atherosclerotic plaque calcification. Skull: No acute or posttraumatic finding. Sinuses/Orbits: Dysconjugate gaze, nonspecific. IMPRESSION: 1. No acute finding. 2. Age advanced atrophy and chronic microvascular disease. Electronically Signed   By: Marnee Spring M.D.   On: 09/17/2016 10:57   Dg Chest Port 1 View  Result Date: 09/17/2016 CLINICAL DATA:  Fever. EXAM: PORTABLE CHEST 1 VIEW COMPARISON:  09/04/2016 . FINDINGS: Cardiomegaly. Mild diffuse bilateral pulmonary interstitial prominence with small left pleural effusion. Mild CHF cannot be excluded. No pneumothorax. IMPRESSION: Cardiomegaly with mild diffuse bilateral from interstitial  prominence of small left pleural effusion. Mild CHF cannot be excluded . Electronically Signed   By: Maisie Fus  Register   On: 09/17/2016 10:22    Procedures Procedures (including critical care time) CRITICAL CARE Performed by: Arby Barrette   Total critical care time:30 minutes  Critical care time was exclusive of separately billable procedures  and treating other patients.  Critical care was necessary to treat or prevent imminent or life-threatening deterioration.  Critical care was time spent personally by me on the following activities: development of treatment plan with patient and/or surrogate as well as nursing, discussions with consultants, evaluation of patient's response to treatment, examination of patient, obtaining history from patient or surrogate, ordering and performing treatments and interventions, ordering and review of laboratory studies, ordering and review of radiographic studies, pulse oximetry and re-evaluation of patient's condition. Medications Ordered in ED Medications  sodium chloride 0.9 % bolus 1,000 mL (0 mLs Intravenous Stopped 09/17/16 1044)    And  sodium chloride 0.9 % bolus 1,000 mL (1,000 mLs Intravenous New Bag/Given 09/17/16 1047)    And  sodium chloride 0.9 % bolus 1,000 mL (1,000 mLs Intravenous New Bag/Given 09/17/16 1118)    And  sodium chloride 0.9 % bolus 500 mL (500 mLs Intravenous New Bag/Given 09/17/16 1146)  vancomycin (VANCOCIN) 1,500 mg in sodium chloride 0.9 % 500 mL IVPB (1,500 mg Intravenous New Bag/Given 09/17/16 1016)  piperacillin-tazobactam (ZOSYN) IVPB 3.375 g (not administered)  vancomycin (VANCOCIN) 1,250 mg in sodium chloride 0.9 % 250 mL IVPB (not administered)  sodium chloride 0.9 % 1,000 mL with potassium chloride 80 mEq infusion (not administered)  piperacillin-tazobactam (ZOSYN) IVPB 3.375 g (0 g Intravenous Stopped 09/17/16 1046)  acetaminophen (TYLENOL) suppository 975 mg (975 mg Rectal Given 09/17/16 1006)      Initial Impression / Assessment and Plan / ED Course  I have reviewed the triage vital signs and the nursing notes.  Pertinent labs & imaging results that were available during my care of the patient were reviewed by me and considered in my medical decision making (see chart for details).  Clinical Course   11:20 patient has slight improvement. Remains ill in appearance and somnolent but now answering questions. Opens eyes to voice. Patient continues to protect airway. Still infusing third liter normal saline. Consult: Reviewed with Dr. Margot Ables for admission. Final Clinical Impressions(s) / ED Diagnoses   Final diagnoses:  Sepsis, due to unspecified organism [facility])   Patient presents with acute onset of fever and mental status change. He is ill in appearance upon arrival. Sepsis protocol initiated. New Prescriptions New Prescriptions   No medications on file     Arby Barrette, MD 09/17/16 1148

## 2016-09-17 NOTE — ED Triage Notes (Addendum)
[facility] from a fall. Staff reports baseline is a x 4. This morning he had AMS, unsure if he was like this last night, b/c he was sleeping per staff. GCS 9. Oxygen sats in 80's on RA. Has hx of having urinary catheter but has pulled it out twice.

## 2016-09-17 NOTE — ED Notes (Signed)
Dr. Konrad DoloresMerrell made aware of patient's BP.

## 2016-09-17 NOTE — Progress Notes (Signed)
Pharmacy Antibiotic Note  Mark BaliJeffrey Meadows is a 65 y.o. male admitted on 09/17/2016 with UTI.  Pharmacy has been consulted for cefepime dosing. Tmax is 104.8 and WBC is elevated at 13.6. Lactic acid is elevated but trending down to 3.49.   Plan: Cefepime 2g IV x 1 then 1g IV Q24H F/u renal fxn, C&S, clinical status   Height: 5\' 10"  (177.8 cm) Weight: 230 lb (104.3 kg) IBW/kg (Calculated) : 73  Temp (24hrs), Avg:103.3 F (39.6 C), Min:100.9 F (38.3 C), Max:104.8 F (40.4 C)   Recent Labs Lab 09/17/16 0749 09/17/16 1005 09/17/16 1401  WBC 13.6*  --   --   CREATININE 1.77*  --   --   LATICACIDVEN  --  5.65* 3.49*    Estimated Creatinine Clearance: 49.6 mL/min (by C-G formula based on SCr of 1.77 mg/dL (H)).    No Known Allergies  Antimicrobials this admission: Cefepime 12/27>> Vanc x 1 12/27 Zosyn x 1 12/27  Dose adjustments this admission: N/A  Microbiology results: Pending  Thank you for allowing pharmacy to be a part of this patient's care.  Mark Meadows, Mark Meadows Mark Meadows 09/17/2016 3:13 PM

## 2016-09-17 NOTE — Progress Notes (Signed)
Nursing Home Location: [facility]   Place of Service: SNF (31)  PCP: Elizabeth PalauANDERSON,TERESA, FNP  No Known Allergies  Chief Complaint  Patient presents with  . Acute Visit    Fever and altered mental status    HPI:  Patient is a 65 y.o. male [facility] due to [facility]. Pt with hx of ETOH abuse, hypothyroidism, BPH, htn. Staff reports he was his usual self yesterday without complaints. Pt was out of bed, got his hair cut and stable. Today nursing walks in to do vitals and RR rate was up and fever noted at 104. Pt with decrease LOC and not answering questions. Increase RR noted this morning with drop is sats. orginally O2 80% on room air. Increased to 89% on 2L Nursing staff gave him tylenol PR due to fever.  Reports he had foley catheter due to BPH but kept pulling catheter out. Since he was voiding it was left out.  Pt also having regular BMs. No constipation or diarrhea   Review of Systems:  Review of Systems  Unable to perform ROS: Acuity of condition    Past Medical History:  Diagnosis Date  . Acute blood loss anemia   . Alcohol dependence (HCC)   . Alcoholic hepatitis   . Dysrhythmia    "it doesn't move exactly the same; this is recent dx" (09/02/2016)  . GERD (gastroesophageal reflux disease)   . Gout   . Hematochezia   . Hyperglycemia   . Hypertension   . Hypokalemia   . Hypothyroidism   . Thrombocytopenia (HCC)    Past Surgical History:  Procedure Laterality Date  . COLONOSCOPY N/A 08/26/2016   Procedure: COLONOSCOPY;  Surgeon: Meryl DareMalcolm T Stark, MD;  [facility];  Service: Endoscopy;  Laterality: N/A;  . TONSILLECTOMY  1957   Social History:   reports that he quit smoking about 38 years ago. His smoking use included Cigarettes. He has a 36.00 pack-year smoking history. He has never used smokeless tobacco. He reports that he drinks about 48.0 oz of alcohol per week . He reports that he uses drugs, including  Marijuana.  History reviewed. No pertinent family history.  Medications: Patient's Medications  New Prescriptions   No medications on file  Previous Medications   AMLODIPINE (NORVASC) 10 MG TABLET    Take 1 tablet (10 mg total) by mouth daily.   CHLORDIAZEPOXIDE (LIBRIUM) 10 MG CAPSULE    Take 10 mg by mouth every 6 (six) hours.   FOLIC ACID (FOLVITE) 1 MG TABLET    Take 1 tablet (1 mg total) by mouth daily.   HYDROCORTISONE (ANUSOL-HC) 25 MG SUPPOSITORY    Place 25 mg rectally 2 (two) times daily as needed for hemorrhoids or itching.   IBUPROFEN (ADVIL,MOTRIN) 200 MG TABLET    Take 400 mg by mouth every 6 (six) hours as needed.   LEVOTHYROXINE (SYNTHROID, LEVOTHROID) 300 MCG TABLET    Take 1 tablet (300 mcg total) by mouth daily before breakfast.   METOPROLOL TARTRATE (LOPRESSOR) 25 MG TABLET    Take 0.5 tablets (12.5 mg total) by mouth 2 (two) times daily.   PANTOPRAZOLE (PROTONIX) 40 MG TABLET    Take 1 tablet (40 mg total) by mouth 2 (two) times daily.   SPIRONOLACTONE (ALDACTONE) 25 MG TABLET    Take 25 mg by mouth daily.   THIAMINE 100 MG TABLET    Take 1 tablet (100 mg total) by mouth daily.   VITAMIN B-12 (CYANOCOBALAMIN)  1000 MCG TABLET    Take 1,000 mcg by mouth once a week. For 4 weeks. Stop date: 10/08/16  Modified Medications   No medications on file  Discontinued Medications   HYDROCHLOROTHIAZIDE (HYDRODIURIL) 25 MG TABLET    Take 1 tablet (25 mg total) by mouth daily.   HYDROCORTISONE (ANUSOL-HC) 25 MG SUPPOSITORY    Place 1 suppository (25 mg total) rectally 2 (two) times daily.   TAMSULOSIN (FLOMAX) 0.4 MG CAPS CAPSULE    Take 1 capsule (0.4 mg total) by mouth daily after supper.   VITAMIN B-12 (CYANOCOBALAMIN) 1000 MCG TABLET    Take 1 tablet (1,000 mcg total) by mouth daily.     Physical Exam: Vitals:   09/17/16 0913  BP: (!) 106/55  Pulse: (!) 126  Resp: (!) 32  Temp: (!) 104.2 F (40.1 C)  SpO2: (!) 89%    Physical Exam  Constitutional: He appears  well-developed and well-nourished. He appears lethargic. He appears ill.  HENT:  Head: Normocephalic and atraumatic.  Mouth/Throat: Mucous membranes are normal.  Eyes:  Pupils equal but sluggish  Cardiovascular: Regular rhythm.  Tachycardia present.   Pulmonary/Chest: Breath sounds normal. No accessory muscle usage. Tachypnea noted.  Abdominal: Bowel sounds are decreased. There is tenderness in the suprapubic area.  Neurological: He appears lethargic.  Not following commands, responds to painful stimuli   Skin: Skin is warm and dry.    Labs reviewed: Basic Metabolic Panel:  Recent Labs  16/10/96 0422 08/26/16 0603  09/02/16 1659 09/03/16 0254 09/04/16 0137 09/08/16 09/09/16  NA 134* 140  < > 138 143 140 141 139  K 3.8 3.4*  < > 3.2* 2.8* 3.2* 3.4 3.3*  CL 104 109  < > 103 107 106  --   --   CO2 23 21*  < > 27 26 25   --   --   GLUCOSE 127* 124*  < > 141* 120* 96  --   --   BUN 8 7  < > 14 16 13  24* 26*  CREATININE 0.98 0.96  < > 0.90 0.89 0.86 1.1 1.0  CALCIUM 8.1* 8.4*  < > 9.2 8.7* 8.3*  --   --   MG 2.0 1.8  --  1.8  --   --   --   --   < > = values in this interval not displayed. Liver Function Tests:  Recent Labs  08/27/16 0549 09/02/16 1659 09/04/16 0137  AST 255* 104* 112*  ALT 121* 69* 62  ALKPHOS 128* 126 112  BILITOT 2.1* 2.8* 1.5*  PROT 6.9 8.3* 6.5  ALBUMIN 3.1* 3.3* 2.4*    Recent Labs  08/27/16 0549 09/02/16 1659  LIPASE 35 18    Recent Labs  08/27/16 0549 09/02/16 1659  AMMONIA 48* 14   CBC:  Recent Labs  08/24/16 0300  09/02/16 1659 09/03/16 0254 09/04/16 0137 09/04/16 0843 09/08/16 09/09/16  WBC 5.6  < > 15.6* 13.7* 10.5  --  11.5 11.9  NEUTROABS 3.8  --  12.9*  --   --   --   --   --   HGB 8.3*  < > 9.4* 8.3* 7.4* 7.9* 8.8* 9.2*  HCT 23.7*  < > 27.6* 25.0* 22.8* 23.8* 25* 27*  MCV 113.4*  < > 116.5* 116.8* 117.5*  --   --   --   PLT 67*  < > 274 256 214  --  303 315  < > = values in this interval not  displayed. TSH:  Recent Labs  08/25/16 2350 08/28/16 0001 09/02/16 1659  TSH 31.630* 62.857* 32.635*   A1C: Lab Results  Component Value Date   HGBA1C 4.9 08/22/2016   Lipid Panel:  Recent Labs  09/03/16 0254  CHOL 159  HDL 26*  LDLCALC 110*  TRIG 117  CHOLHDL 6.1    Assessment/Plan 1. Altered mental status, unspecified altered mental status type Pt with AMS as well as tachycardia, tachypnea, drop in O2 sats and temp of 104. Will send to the ED for further evaluation and workup of possible sepsis.    Janene HarveyJessica K. Biagio BorgEubanks, AGNP  [facility] [phone](Monday-Friday 8 am - 5 pm) [phone] (after hours)

## 2016-09-17 NOTE — ED Notes (Signed)
Spoke with Dr. Konrad DoloresMerrell about patient's BP. It is 87/52. Reports no intervention at this time. Is on 150 cc NS.

## 2016-09-17 NOTE — ED Notes (Signed)
Patient transported to X-ray 

## 2016-09-18 ENCOUNTER — Inpatient Hospital Stay (HOSPITAL_COMMUNITY): Payer: Medicare Other

## 2016-09-18 DIAGNOSIS — G9340 Encephalopathy, unspecified: Secondary | ICD-10-CM

## 2016-09-18 DIAGNOSIS — Z7189 Other specified counseling: Secondary | ICD-10-CM

## 2016-09-18 DIAGNOSIS — Z515 Encounter for palliative care: Secondary | ICD-10-CM

## 2016-09-18 LAB — BLOOD CULTURE ID PANEL (REFLEXED)
ACINETOBACTER BAUMANNII: NOT DETECTED
CANDIDA ALBICANS: NOT DETECTED
CANDIDA GLABRATA: NOT DETECTED
CANDIDA PARAPSILOSIS: NOT DETECTED
CANDIDA TROPICALIS: NOT DETECTED
Candida krusei: NOT DETECTED
Carbapenem resistance: NOT DETECTED
ENTEROBACTER CLOACAE COMPLEX: NOT DETECTED
Enterobacteriaceae species: DETECTED — AB
Enterococcus species: NOT DETECTED
Escherichia coli: DETECTED — AB
HAEMOPHILUS INFLUENZAE: NOT DETECTED
KLEBSIELLA PNEUMONIAE: NOT DETECTED
Klebsiella oxytoca: NOT DETECTED
Listeria monocytogenes: NOT DETECTED
NEISSERIA MENINGITIDIS: NOT DETECTED
PROTEUS SPECIES: NOT DETECTED
Pseudomonas aeruginosa: NOT DETECTED
SERRATIA MARCESCENS: NOT DETECTED
STAPHYLOCOCCUS SPECIES: NOT DETECTED
STREPTOCOCCUS AGALACTIAE: NOT DETECTED
STREPTOCOCCUS SPECIES: NOT DETECTED
Staphylococcus aureus (BCID): NOT DETECTED
Streptococcus pneumoniae: NOT DETECTED
Streptococcus pyogenes: NOT DETECTED

## 2016-09-18 LAB — COMPREHENSIVE METABOLIC PANEL
ALBUMIN: 1.9 g/dL — AB (ref 3.5–5.0)
ALT: 39 U/L (ref 17–63)
AST: 86 U/L — AB (ref 15–41)
Alkaline Phosphatase: 176 U/L — ABNORMAL HIGH (ref 38–126)
Anion gap: 9 (ref 5–15)
BUN: 19 mg/dL (ref 6–20)
CHLORIDE: 114 mmol/L — AB (ref 101–111)
CO2: 20 mmol/L — AB (ref 22–32)
CREATININE: 1.43 mg/dL — AB (ref 0.61–1.24)
Calcium: 7.7 mg/dL — ABNORMAL LOW (ref 8.9–10.3)
GFR calc Af Amer: 57 mL/min — ABNORMAL LOW (ref >60)
GFR calc non Af Amer: 50 mL/min — ABNORMAL LOW (ref >60)
GLUCOSE: 134 mg/dL — AB (ref 65–99)
POTASSIUM: 3.8 mmol/L (ref 3.5–5.1)
SODIUM: 143 mmol/L (ref 135–145)
Total Bilirubin: 1 mg/dL (ref 0.3–1.2)
Total Protein: 6 g/dL — ABNORMAL LOW (ref 6.5–8.1)

## 2016-09-18 LAB — CBC
HEMATOCRIT: 24.2 % — AB (ref 39.0–52.0)
Hemoglobin: 7.6 g/dL — ABNORMAL LOW (ref 13.0–17.0)
MCH: 36 pg — AB (ref 26.0–34.0)
MCHC: 31.4 g/dL (ref 30.0–36.0)
MCV: 114.7 fL — AB (ref 78.0–100.0)
PLATELETS: 167 10*3/uL (ref 150–400)
RBC: 2.11 MIL/uL — ABNORMAL LOW (ref 4.22–5.81)
RDW: 14.9 % (ref 11.5–15.5)
WBC: 28.4 10*3/uL — ABNORMAL HIGH (ref 4.0–10.5)

## 2016-09-18 LAB — AMMONIA: Ammonia: 14 umol/L (ref 9–35)

## 2016-09-18 LAB — LACTIC ACID, PLASMA: LACTIC ACID, VENOUS: 2.5 mmol/L — AB (ref 0.5–1.9)

## 2016-09-18 LAB — MRSA PCR SCREENING: MRSA BY PCR: NEGATIVE

## 2016-09-18 MED ORDER — DEXTROSE 5 % IV SOLN
1.0000 g | Freq: Two times a day (BID) | INTRAVENOUS | Status: DC
  Administered 2016-09-18 – 2016-09-19 (×2): 1 g via INTRAVENOUS
  Filled 2016-09-18 (×2): qty 1

## 2016-09-18 MED ORDER — ORAL CARE MOUTH RINSE
15.0000 mL | Freq: Two times a day (BID) | OROMUCOSAL | Status: DC
  Administered 2016-09-18 – 2016-09-23 (×10): 15 mL via OROMUCOSAL

## 2016-09-18 MED ORDER — CHLORDIAZEPOXIDE HCL 5 MG PO CAPS
10.0000 mg | ORAL_CAPSULE | Freq: Three times a day (TID) | ORAL | Status: DC
  Administered 2016-09-18 – 2016-09-20 (×5): 10 mg via ORAL
  Filled 2016-09-18 (×5): qty 2

## 2016-09-18 MED ORDER — COLCHICINE 0.6 MG PO TABS
0.6000 mg | ORAL_TABLET | Freq: Every day | ORAL | Status: DC
  Administered 2016-09-18 – 2016-09-20 (×3): 0.6 mg via ORAL
  Filled 2016-09-18 (×3): qty 1

## 2016-09-18 MED ORDER — SODIUM CHLORIDE 0.9 % IV BOLUS (SEPSIS)
500.0000 mL | Freq: Once | INTRAVENOUS | Status: AC
  Administered 2016-09-18: 500 mL via INTRAVENOUS
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
## 2016-09-18 NOTE — Progress Notes (Signed)
Pt arrived on unit in afib HR 80s with BP 85/59. [facility] on call fellow, Schorr, notified and ordered additional bolus and said to continue to treat afib with subcutaneous heparin. Pt resting. Will continue to monitor.

## 2016-09-18 NOTE — Progress Notes (Addendum)
PHARMACY - PHYSICIAN COMMUNICATION CRITICAL VALUE ALERT - BLOOD CULTURE IDENTIFICATION (BCID)  Results for orders placed or performed during the hospital encounter of 09/17/16  Blood Culture ID Panel (Reflexed) (Collected: 09/17/2016  9:49 AM)  Result Value Ref Range   Enterococcus species NOT DETECTED NOT DETECTED   Listeria monocytogenes NOT DETECTED NOT DETECTED   Staphylococcus species NOT DETECTED NOT DETECTED   Staphylococcus aureus NOT DETECTED NOT DETECTED   Streptococcus species NOT DETECTED NOT DETECTED   Streptococcus agalactiae NOT DETECTED NOT DETECTED   Streptococcus pneumoniae NOT DETECTED NOT DETECTED   Streptococcus pyogenes NOT DETECTED NOT DETECTED   Acinetobacter baumannii NOT DETECTED NOT DETECTED   Enterobacteriaceae species DETECTED (A) NOT DETECTED   Enterobacter cloacae complex NOT DETECTED NOT DETECTED   Escherichia coli DETECTED (A) NOT DETECTED   Klebsiella oxytoca NOT DETECTED NOT DETECTED   Klebsiella pneumoniae NOT DETECTED NOT DETECTED   Proteus species NOT DETECTED NOT DETECTED   Serratia marcescens NOT DETECTED NOT DETECTED   Carbapenem resistance NOT DETECTED NOT DETECTED   Haemophilus influenzae NOT DETECTED NOT DETECTED   Neisseria meningitidis NOT DETECTED NOT DETECTED   Pseudomonas aeruginosa NOT DETECTED NOT DETECTED   Candida albicans NOT DETECTED NOT DETECTED   Candida glabrata NOT DETECTED NOT DETECTED   Candida krusei NOT DETECTED NOT DETECTED   Candida parapsilosis NOT DETECTED NOT DETECTED   Candida tropicalis NOT DETECTED NOT DETECTED    Name of physician (or Provider) Contacted:   NA  Changes to prescribed antibiotics required:   Currently receiving Cefepime 1 g IV q24h.  Consider narrowing to Rocephin 2 g IV q24h  Eddie Candlebbott, Gregory Vernon 09/18/2016  1:26 AM   Addendum: will increase cefepime to 1g q 12 hr for improving renal function.  Tad MooreJessica Shalita Notte, Pharm D, BCPS  Clinical Pharmacist Pager [phone]   09/18/2016 3:26 PM

## 2016-09-18 NOTE — Progress Notes (Signed)
Notified phlebotomy that pt is a lab stick for new labs ordered.  States, "will be there soon".

## 2016-09-18 NOTE — Progress Notes (Signed)
PROGRESS NOTE    Mark Meadows  ZOX:[id number] DOB: 01-Jun-1951 DOA: 09/17/2016 PCP: Elizabeth Palau, FNP   Brief Narrative: Mark Meadows is a 65 y.o. male with medical history significant of EtOH abuse, alcohol induced hepatitis, atrial fibrillation, GERD, hypertension, hypokalemia, thrombocytopenia, hypothyroidism, gout, presenting from [facility] acutely encephalopathic and unable to provide reliable history other than "I don't feel well.   Discussed case with staff at nursing home Viv[facility]), Chip Donald Siva (brother-in-law), and left a message with his attorney, Montez Hageman at [phone].    ED Course: Objective findings outlined below. Sepsis protocol initiated with aggressive IV hydration and antibiotics.   Assessment & Plan:   Active Problems:   Hypertension   Alcoholism /alcohol abuse (HCC)   Hypokalemia   Sepsis (HCC)   Localized swelling on right hand   Elevated troponin   1-Sepsis; Gram negative bacteremia.  Secondary to UTI, UA with too numerous to count WBC.  Urine culture ; E coli.  Continue with cefepime.  Blood culture: gram negative rods.  Lactic acid trending down form 5 to 2.5.  Stress dose steroids.   2-UTI; history of urine retention.  IV antibiotics.  Bladder scan. If retention, might need foley catheter and or urology evaluation.   3-gout; start colchicine.   4-right had edema, redness; x ary negative. Continue with IV antibiotics.   5-ETOH abuse, history of.  Resume librium.    6-AKI; renal function improved.   Acute Hypoxic respiratory failure;  Discontinue IV fluids.  Chest x ray.  incentive spirometry   Anemia; follow trend, component of hemodilution. Repeat labs in am.   Cirrhosis; resume diuretics when vitals stable.  ammonia normal.   Hypothyroidism; continue with synthroid.   Elevated troponin;     DVT prophylaxis: Heparin  Code Status: DNR Family Communication: care discussed with patient./    Disposition Plan: back to snf when stable.   Consultants:   Palliative    Procedures:  none   Antimicrobials: cefepime    Subjective: He is alert, answering questions. He is aware he is in the hospital.  Denies pain.   Objective: Vitals:   09/18/16 0405 09/18/16 0700 09/18/16 0732 09/18/16 0800  BP:  (!) 76/56 92/71 95/70   Pulse:  82 84 84  Resp:  (!) 22 (!) 21 (!) 21  Temp: 98.1 F (36.7 C)  98 F (36.7 C)   TempSrc: Oral  Oral   SpO2: 95% 91% 96% 99%  Weight:      Height:        Intake/Output Summary (Last 24 hours) at 09/18/16 0818 Last data filed at 09/18/16 0800  Gross per 24 hour  Intake         11103.75 ml  Output                0 ml  Net         11103.75 ml   Filed Weights   09/17/16 1019 09/17/16 2330  Weight: 104.3 kg (230 lb) 102.1 kg (225 lb)    Examination:  General exam: Appears calm and comfortable  Respiratory system: Clear to auscultation. Respiratory effort normal. Cardiovascular system: S1 & S2 heard, RRR. No JVD, murmurs, rubs, gallops or clicks. No pedal edema. Gastrointestinal system: Abdomen is nondistended, soft and nontender. No organomegaly or masses felt. Normal bowel sounds heard. Central nervous system: Alert and oriented. No focal neurological deficits. Extremities: Symmetric 5 x 5 power. Skin: No rashes, lesions or ulcers Psychiatry: Judgement and insight appear normal. Mood &  affect appropriate.     Data Reviewed: I have personally reviewed following labs and imaging studies  CBC:  Recent Labs Lab 09/17/16 0749 09/18/16 0221  WBC 13.6* 28.4*  NEUTROABS 13.2*  --   HGB 9.3* 7.6*  HCT 28.5* 24.2*  MCV 111.3* 114.7*  PLT 240 167   Basic Metabolic Panel:  Recent Labs Lab 09/17/16 0749 09/18/16 0221  NA 141 143  K 2.9* 3.8  CL 106 114*  CO2 21* 20*  GLUCOSE 109* 134*  BUN 14 19  CREATININE 1.77* 1.43*  CALCIUM 8.4* 7.7*   GFR: Estimated Creatinine Clearance: 60.8 mL/min (by C-G formula based on SCr  of 1.43 mg/dL (H)). Liver Function Tests:  Recent Labs Lab 09/17/16 0749 09/18/16 0221  AST 115* 86*  ALT 41 39  ALKPHOS 329* 176*  BILITOT 1.4* 1.0  PROT 6.7 6.0*  ALBUMIN 2.3* 1.9*    Recent Labs Lab 09/17/16 0749  LIPASE 18   No results for input(s): AMMONIA in the last 168 hours. Coagulation Profile:  Recent Labs Lab 09/17/16 0749 09/17/16 1536  INR 1.30 1.28   Cardiac Enzymes:  Recent Labs Lab 09/17/16 0749 09/17/16 1536  TROPONINI 0.06* 0.06*   BNP (last 3 results) No results for input(s): PROBNP in the last 8760 hours. HbA1C: No results for input(s): HGBA1C in the last 72 hours. CBG: No results for input(s): GLUCAP in the last 168 hours. Lipid Profile: No results for input(s): CHOL, HDL, LDLCALC, TRIG, CHOLHDL, LDLDIRECT in the last 72 hours. Thyroid Function Tests: No results for input(s): TSH, T4TOTAL, FREET4, T3FREE, THYROIDAB in the last 72 hours. Anemia Panel: No results for input(s): VITAMINB12, FOLATE, FERRITIN, TIBC, IRON, RETICCTPCT in the last 72 hours. Sepsis Labs:  Recent Labs Lab 09/17/16 1005 09/17/16 1401 09/17/16 1536  PROCALCITON  --   --  112.02  LATICACIDVEN 5.65* 3.49*  --     Recent Results (from the past 240 hour(s))  Blood Culture (routine x 2)     Status: None (Preliminary result)   Collection Time: 09/17/16  9:49 AM  Result Value Ref Range Status   Specimen Description BLOOD BLOOD LEFT FOREARM  Final   Special Requests BOTTLES DRAWN AEROBIC AND ANAEROBIC 5CC  Final   Culture  Setup Time   Final    GRAM NEGATIVE RODS IN BOTH AEROBIC AND ANAEROBIC BOTTLES Organism ID to follow CRITICAL RESULT CALLED TO, READ BACK BY AND VERIFIED WITH: GREG ABBOTT,PHARMD @0122  09/18/16 MKELLY,MLT    Culture PENDING  Incomplete   Report Status PENDING  Incomplete  Blood Culture ID Panel (Reflexed)     Status: Abnormal   Collection Time: 09/17/16  9:49 AM  Result Value Ref Range Status   Enterococcus species NOT DETECTED NOT  DETECTED Final   Listeria monocytogenes NOT DETECTED NOT DETECTED Final   Staphylococcus species NOT DETECTED NOT DETECTED Final   Staphylococcus aureus NOT DETECTED NOT DETECTED Final   Streptococcus species NOT DETECTED NOT DETECTED Final   Streptococcus agalactiae NOT DETECTED NOT DETECTED Final   Streptococcus pneumoniae NOT DETECTED NOT DETECTED Final   Streptococcus pyogenes NOT DETECTED NOT DETECTED Final   Acinetobacter baumannii NOT DETECTED NOT DETECTED Final   Enterobacteriaceae species DETECTED (A) NOT DETECTED Final    Comment: CRITICAL RESULT CALLED TO, READ BACK BY AND VERIFIED WITH: GREG ABBOTT,PHARMD @0122  09/18/16 MKELLY,MLT    Enterobacter cloacae complex NOT DETECTED NOT DETECTED Final   Escherichia coli DETECTED (A) NOT DETECTED Final    Comment: CRITICAL RESULT CALLED TO,  READ BACK BY AND VERIFIED WITH: GREG ABBOTT,PHARMD @0122  09/18/16 MKELLY,MLT    Klebsiella oxytoca NOT DETECTED NOT DETECTED Final   Klebsiella pneumoniae NOT DETECTED NOT DETECTED Final   Proteus species NOT DETECTED NOT DETECTED Final   Serratia marcescens NOT DETECTED NOT DETECTED Final   Carbapenem resistance NOT DETECTED NOT DETECTED Final   Haemophilus influenzae NOT DETECTED NOT DETECTED Final   Neisseria meningitidis NOT DETECTED NOT DETECTED Final   Pseudomonas aeruginosa NOT DETECTED NOT DETECTED Final   Candida albicans NOT DETECTED NOT DETECTED Final   Candida glabrata NOT DETECTED NOT DETECTED Final   Candida krusei NOT DETECTED NOT DETECTED Final   Candida parapsilosis NOT DETECTED NOT DETECTED Final   Candida tropicalis NOT DETECTED NOT DETECTED Final  Blood Culture (routine x 2)     Status: None (Preliminary result)   Collection Time: 09/17/16 10:00 AM  Result Value Ref Range Status   Specimen Description BLOOD BLOOD LEFT FOREARM  Final   Special Requests BOTTLES DRAWN AEROBIC AND ANAEROBIC 5CC  Final   Culture  Setup Time   Final    GRAM NEGATIVE RODS ANAEROBIC BOTTLE  ONLY CRITICAL VALUE NOTED.  VALUE IS CONSISTENT WITH PREVIOUSLY REPORTED AND CALLED VALUE.    Culture PENDING  Incomplete   Report Status PENDING  Incomplete  MRSA PCR Screening     Status: None   Collection Time: 09/17/16 11:24 PM  Result Value Ref Range Status   MRSA by PCR NEGATIVE NEGATIVE Final    Comment:        The GeneXpert MRSA Assay (FDA approved for NASAL specimens only), is one component of a comprehensive MRSA colonization surveillance program. It is not intended to diagnose MRSA infection nor to guide or monitor treatment for MRSA infections.          Radiology Studies: Ct Head Wo Contrast  Result Date: 09/17/2016 CLINICAL DATA:  Fall yesterday. Change in mental status. Initial encounter. EXAM: CT HEAD WITHOUT CONTRAST TECHNIQUE: Contiguous axial images were obtained from the base of the skull through the vertex without intravenous contrast. COMPARISON:  None. FINDINGS: Brain: No evidence of acute infarction, hemorrhage, hydrocephalus, extra-axial collection or mass lesion/mass effect. Age advanced atrophy and white matter disease attributed to chronic microvascular ischemia. Vascular: No hyperdense vessel. Prominent posterior circulation atherosclerotic plaque calcification. Skull: No acute or posttraumatic finding. Sinuses/Orbits: Dysconjugate gaze, nonspecific. IMPRESSION: 1. No acute finding. 2. Age advanced atrophy and chronic microvascular disease. Electronically Signed   By: Marnee SpringJonathon  Watts M.D.   On: 09/17/2016 10:57   Dg Chest Port 1 View  Result Date: 09/17/2016 CLINICAL DATA:  Fever. EXAM: PORTABLE CHEST 1 VIEW COMPARISON:  09/04/2016 . FINDINGS: Cardiomegaly. Mild diffuse bilateral pulmonary interstitial prominence with small left pleural effusion. Mild CHF cannot be excluded. No pneumothorax. IMPRESSION: Cardiomegaly with mild diffuse bilateral from interstitial prominence of small left pleural effusion. Mild CHF cannot be excluded . Electronically Signed    By: Maisie Fushomas  Register   On: 09/17/2016 10:22   Dg Hand Complete Right  Result Date: 09/17/2016 CLINICAL DATA:  Swelling, pain and redness in index finger. No known injury. EXAM: RIGHT HAND - COMPLETE 3+ VIEW COMPARISON:  None. FINDINGS: There is no evidence of fracture or dislocation. There is no evidence of arthropathy or other focal bone abnormality. Soft tissues are unremarkable. IMPRESSION: Negative. Electronically Signed   By: Charlett NoseKevin  Dover M.D.   On: 09/17/2016 18:19        Scheduled Meds: . ceFEPime (MAXIPIME) IV  1 g  Intravenous Q24H  . heparin  5,000 Units Subcutaneous Q8H  . hydrocortisone sod succinate (SOLU-CORTEF) inj  50 mg Intravenous Q6H  . levothyroxine  300 mcg Oral QAC breakfast  . mouth rinse  15 mL Mouth Rinse BID  . sodium chloride flush  3 mL Intravenous Q12H   Continuous Infusions: . sodium chloride 125 mL/hr at 09/18/16 0800     LOS: 1 day    Time spent: 35 minutes.     Alba Coryegalado, Rashida Ladouceur A, MD [facility] Pager [phone]  If 7PM-7AM, please contact night-coverage [email or web address] Password [email or web address] 09/18/2016, 8:18 AM

## 2016-09-19 DIAGNOSIS — A4151 Sepsis due to Escherichia coli [E. coli]: Principal | ICD-10-CM

## 2016-09-19 LAB — CBC
HCT: 24.8 % — ABNORMAL LOW (ref 39.0–52.0)
Hemoglobin: 7.8 g/dL — ABNORMAL LOW (ref 13.0–17.0)
MCH: 36.3 pg — AB (ref 26.0–34.0)
MCHC: 31.5 g/dL (ref 30.0–36.0)
MCV: 115.3 fL — ABNORMAL HIGH (ref 78.0–100.0)
PLATELETS: 150 10*3/uL (ref 150–400)
RBC: 2.15 MIL/uL — AB (ref 4.22–5.81)
RDW: 14.3 % (ref 11.5–15.5)
WBC: 22.8 10*3/uL — ABNORMAL HIGH (ref 4.0–10.5)

## 2016-09-19 LAB — BASIC METABOLIC PANEL
Anion gap: 8 (ref 5–15)
BUN: 23 mg/dL — ABNORMAL HIGH (ref 6–20)
CO2: 21 mmol/L — ABNORMAL LOW (ref 22–32)
CREATININE: 0.99 mg/dL (ref 0.61–1.24)
Calcium: 8.3 mg/dL — ABNORMAL LOW (ref 8.9–10.3)
Chloride: 115 mmol/L — ABNORMAL HIGH (ref 101–111)
Glucose, Bld: 164 mg/dL — ABNORMAL HIGH (ref 65–99)
POTASSIUM: 3.6 mmol/L (ref 3.5–5.1)
SODIUM: 144 mmol/L (ref 135–145)

## 2016-09-19 LAB — URINE CULTURE: Culture: >=100000 — AB

## 2016-09-19 MED ORDER — DEXTROSE 5 % IV SOLN
2.0000 g | INTRAVENOUS | Status: DC
  Administered 2016-09-19 – 2016-09-21 (×3): 2 g via INTRAVENOUS
  Filled 2016-09-19 (×3): qty 2

## 2016-09-19 MED ORDER — HYDROCORTISONE NA SUCCINATE PF 100 MG IJ SOLR
50.0000 mg | Freq: Two times a day (BID) | INTRAMUSCULAR | Status: DC
  Administered 2016-09-20: 50 mg via INTRAVENOUS
  Filled 2016-09-19: qty 2

## 2016-09-19 MED ORDER — DEXTROSE 5 % IV SOLN
1.0000 g | Freq: Three times a day (TID) | INTRAVENOUS | Status: DC
  Filled 2016-09-19 (×2): qty 1

## 2016-09-19 MED ORDER — SPIRONOLACTONE 25 MG PO TABS
25.0000 mg | ORAL_TABLET | Freq: Every day | ORAL | Status: DC
  Administered 2016-09-19 – 2016-09-21 (×3): 25 mg via ORAL
  Filled 2016-09-19 (×3): qty 1

## 2016-09-19 MED ORDER — METOPROLOL TARTRATE 12.5 MG HALF TABLET
12.5000 mg | ORAL_TABLET | Freq: Two times a day (BID) | ORAL | Status: DC
  Administered 2016-09-19 – 2016-09-23 (×8): 12.5 mg via ORAL
  Filled 2016-09-19 (×8): qty 1

## 2016-09-19 NOTE — Progress Notes (Addendum)
Mr. Mark Meadows and I reviewed the Living Will document together today (09/19/16) in an advanced care planning discussion.     He chose [name] to be his HCPOA and [name] to be the back up HCPOA.  He is an organ donor.  He does not want life prolonging measures in any of the situations listed.  He does not want a feeding tube.  He does want to give his HCPOA the authority to make decisions other than what is indicated in his living will.  Chaplain has been requested for signature and notary.  After our meeting I called both [name] and [name] to make them aware of his choices.  Time In:  1:55 pm Time Out:  2:32 pm  Algis DownsMarianne York, [place] JerseyPA-C Palliative Medicine Pager: [phone]

## 2016-09-19 NOTE — Progress Notes (Signed)
Pharmacy Antibiotic Note  Mark Meadows is a 65 y.o. male admitted on 09/17/2016 with UTI.  Pharmacy has been consulted for cefepime dosing.  Now afebrile.  WBC elevated, but also on steroids.  Blood and urine cultures growing Ecoli.  Scr much improved.  Plan: Increase cefepime to 1g q 8 hrs given improved renal function. F/u renal fxn, C&S, clinical status   Height: 5\' 10"  (177.8 cm) Weight: 225 lb (102.1 kg) IBW/kg (Calculated) : 73  Temp (24hrs), Avg:98 F (36.7 C), Min:97.4 F (36.3 C), Max:98.5 F (36.9 C)   Recent Labs Lab 09/17/16 0749 09/17/16 1005 09/17/16 1401 09/18/16 0221 09/18/16 0849 09/19/16 0804  WBC 13.6*  --   --  28.4*  --  22.8*  CREATININE 1.77*  --   --  1.43*  --  0.99  LATICACIDVEN  --  5.65* 3.49*  --  2.5*  --     Estimated Creatinine Clearance: 87.8 mL/min (by C-G formula based on SCr of 0.99 mg/dL).    No Known Allergies  Antimicrobials this admission: Vanc x 1 12/27 Zosyn x 1 12/27 Cefepime 12/27>>  Dose adjustments this admission:  n/a  Microbiology results:  12/27 BCx x 2: GNR *12/27 BCID with Enterobacteriaceae species, Ecoli 12/27 UCx: > 100K Ecoli - R ampicillin, otherwise sensitive 12/27 MRSA PCR: neg  Thank you for allowing pharmacy to be a part of this patient's care.  Tad MooreJessica Shyne Lehrke, Pharm D, BCPS  Clinical Pharmacist Pager [phone]  09/19/2016 10:19 AM

## 2016-09-19 NOTE — Progress Notes (Signed)
Transferred to [id number] by bed, stable, report given to RN, belongings with pt.

## 2016-09-19 NOTE — Evaluation (Signed)
Clinical/Bedside Swallow Evaluation Patient Details  Name: Mark Meadows MRN: [id number] Date of Birth: Nov 18, 1950  Today's Date: 09/19/2016 Time: SLP Start Time (ACUTE ONLY): 1156 SLP Stop Time (ACUTE ONLY): 1222 SLP Time Calculation (min) (ACUTE ONLY): 26 min  Past Medical History:  Past Medical History:  Diagnosis Date  . Acute blood loss anemia   . Alcohol dependence (HCC)   . Alcoholic hepatitis   . Dysrhythmia    "it doesn't move exactly the same; this is recent dx" (09/02/2016)  . GERD (gastroesophageal reflux disease)   . Gout   . Hematochezia   . Hyperglycemia   . Hypertension   . Hypokalemia   . Hypothyroidism   . Thrombocytopenia (HCC)    Past Surgical History:  Past Surgical History:  Procedure Laterality Date  . COLONOSCOPY N/A 08/26/2016   Procedure: COLONOSCOPY;  Surgeon: Meryl DareMalcolm T Stark, MD;  [facility];  Service: Endoscopy;  Laterality: N/A;  . TONSILLECTOMY  1957   HPI:  65 y.o.malewith past medical history of alcoholism, hypothyroidism, GERD, HTN, atrial fibrillation, and urinary retentionwho was admitted on 12/27/2017from skilled rehabilitation with altered mental status and sepsis. Chest X ray 09/18/16 findings of patchy bilateral lung base opacity could reflect atelectasis or infection. Vascular congestion without overt edema. No pleural effusion evident. Suspected hiatal hernia, subacute and/or chronic bilateral lateral rib fractures. Head CT 09/17/16 showed no acute findings, age advanced atrophy and chronic microvascular disease. No prior history of dysphagia per chart.   Assessment / Plan / Recommendation Clinical Impression  Patient presents with oropharyngeal swallow which appears at bedside to be within functional limits. No signs or symptoms of aspiration observed. Oral manipulation and bolus control appears adequate, swallow appears timely, vocal quality is clear upon phonation. Respiratory rate remains stable. Recommend regular diet  with thin liquids, medications whole with liquid. Upright posture for PO intake; patient may benefit from remaining upright after meals given prior history of GERD. No further SLP needs identified at this time. SLP will sign off.     Aspiration Risk  No limitations    Diet Recommendation Regular;Thin liquid   Liquid Administration via: Cup;Straw Medication Administration: Whole meds with liquid Supervision: Patient able to self feed Compensations: Slow rate;Small sips/bites Postural Changes: Seated upright at 90 degrees;Remain upright for at least 30 minutes after po intake    Other  Recommendations Oral Care Recommendations: Oral care BID   Follow up Recommendations Skilled Nursing facility      Frequency and Duration            Prognosis Prognosis for Safe Diet Advancement: Good      Swallow Study   General Date of Onset: 09/17/16 HPI: 65 y.o.malewith past medical history of alcoholism, hypothyroidism, GERD, HTN, atrial fibrillation, and urinary retentionwho was admitted on 12/27/2017from skilled rehabilitation with altered mental status and sepsis. Chest X ray 09/18/16 findings of patchy bilateral lung base opacity could reflect atelectasis or infection. Vascular congestion without overt edema. No pleural effusion evident. Suspected hiatal hernia, subacute and/or chronic bilateral lateral rib fractures. Head CT 09/17/16 showed no acute findings, age advanced atrophy and chronic microvascular disease. No prior history of dysphagia per chart. Type of Study: Bedside Swallow Evaluation Previous Swallow Assessment: none found Diet Prior to this Study: Regular;Thin liquids Temperature Spikes Noted: No Respiratory Status: Room air History of Recent Intubation: No Behavior/Cognition: Alert;Confused;Pleasant mood Oral Cavity Assessment: Within Functional Limits Oral Care Completed by SLP: No Oral Cavity - Dentition: Adequate natural dentition Vision: Functional for  self-feeding  Self-Feeding Abilities: Able to feed self Patient Positioning: Upright in bed Baseline Vocal Quality: Normal Volitional Cough: Strong Volitional Swallow: Able to elicit    Oral/Motor/Sensory Function Overall Oral Motor/Sensory Function: Within functional limits   Ice Chips Ice chips: Within functional limits   Thin Liquid Thin Liquid: Within functional limits Presentation: Cup;Straw;Self Fed    Nectar Thick Nectar Thick Liquid: Not tested   Honey Thick Honey Thick Liquid: Not tested   Puree Puree: Within functional limits Presentation: Self Fed;Spoon   Solid   GO   Solid: Within functional limits Presentation: Self Fed       Rondel BatonMary Beth Wilfred Dayrit, TennesseeMS CF-SLP Speech-Language Pathologist [phone]  Arlana LindauMary E Cloys Vera 09/19/2016,2:12 PM

## 2016-09-19 NOTE — ACP (Advance Care Planning) (Signed)
Mr. Mark Meadows and I reviewed the Living Will document together today (09/19/16).     He chose [name] to be his HCPOA and [name] to be the back up HCPOA.  He is an organ donor.  He does not want life prolonging measures in any of the situations listed.  He does not want a feeding tube.  He does want to give his HCPOA the authority to make decisions other than what is indicated in his living will.  Chaplain has been requested for signature and notary.  Algis DownsMarianne York, New JerseyPA-C Palliative Medicine Pager: [phone]

## 2016-09-19 NOTE — Progress Notes (Addendum)
PROGRESS NOTE    Mark Meadows  WGN:[id number] DOB: 1951-06-24 DOA: 09/17/2016 PCP: Elizabeth PalauANDERSON,TERESA, FNP   Brief Narrative: Mark Meadows is a 65 y.o. male with medical history significant of EtOH abuse, alcohol induced hepatitis, atrial fibrillation, GERD, hypertension, hypokalemia, thrombocytopenia, hypothyroidism, gout, presenting from [facility] acutely encephalopathic and unable to provide reliable history other than "I don't feel well.   Discussed case with staff at nursing home Vivi Martens(Lindsey Fraiser), Chip Donald SivaStigall (brother-in-law), and left a message with his attorney, Montez HagemanDennis Boring at [phone].    ED Course: Objective findings outlined below. Sepsis protocol initiated with aggressive IV hydration and antibiotics.   Assessment & Plan:   Active Problems:   Hypertension   Alcoholism /alcohol abuse (HCC)   Hypokalemia   Sepsis (HCC)   Localized swelling on right hand   Elevated troponin   Acute encephalopathy   Palliative care encounter   Goals of care, counseling/discussion   1-Sepsis; Gram negative bacteremia. E coli.  Secondary to UTI, UA with too numerous to count WBC.  Urine culture ; E coli.  Change cefepime to ceftriaxone. Would continue with IV antibiotics for now.  Blood culture: growing E coli.  Lactic acid trending down form 5 to 2.5.  Stress dose steroids, taper dose.  BP stable in th 120 range.  WBC trending down.   2-UTI; history of urine retention.  IV antibiotics.  Patient has been able to urinate.   3-gout; start colchicine.   4-right had edema, redness; x ary negative. Continue with IV antibiotics.  Treating for gout also.  Improving.   5-ETOH abuse, history of.  Resume librium.    6-AKI; renal function improved.   Acute encephalopathy;  Secondary to infection, toxic. Improving.   Acute Hypoxic respiratory failure;  Discontinue IV fluids.  Chest x ray Patchy bilateral lung base opacity could reflect atelectasis or infection.  Vascular congestion without overt edema. No pleural effusion evident. incentive spirometry  Oxygen on RA at 95 % .  Resume spironolactone.   Anemia; follow trend, component of hemodilution. Repeat labs in am.  Hb at 7.8.   Cirrhosis; resume diuretics when vitals stable.  ammonia normal.  Resume spironolactone.   Hypothyroidism; continue with synthroid.   Elevated troponin; in the event of sepsis.     DVT prophylaxis: Heparin  Code Status: DNR Family Communication: care discussed with patient./  Disposition Plan: back to snf when stable. Transfer to med-surgery   Consultants:   Palliative    Procedures:  none   Antimicrobials: cefepime    Subjective: He is feeling better. [facility]  He denies dyspnea.   Objective: Vitals:   09/19/16 0400 09/19/16 0430 09/19/16 0741 09/19/16 1151  BP: 92/71 103/71 (!) 89/40 114/85  Pulse: 74 72 78   Resp: 20 (!) 26 (!) 25 (!) 24  Temp: 98.2 F (36.8 C)  97.4 F (36.3 C) 97.3 F (36.3 C)  TempSrc: Oral  Oral Oral  SpO2: (!) 89% (!) 85% 99% 98%  Weight:      Height:        Intake/Output Summary (Last 24 hours) at 09/19/16 1519 Last data filed at 09/19/16 1425  Gross per 24 hour  Intake              540 ml  Output                0 ml  Net  540 ml   Filed Weights   09/17/16 1019 09/17/16 2330  Weight: 104.3 kg (230 lb) 102.1 kg (225 lb)    Examination:  General exam: Appears calm and comfortable  Respiratory system: Clear to auscultation. Respiratory effort normal. Cardiovascular system: S1 & S2 heard, RRR. No JVD, murmurs, rubs, gallops or clicks. No pedal edema. Gastrointestinal system: Abdomen is nondistended, soft and nontender. No organomegaly or masses felt. Normal bowel sounds heard. Central nervous system: Alert and oriented. No focal neurological deficits. Extremities: Symmetric 5 x 5 power. Skin: No rashes, lesions or ulcers Psychiatry: Judgement and insight appear  normal. Mood & affect appropriate.     Data Reviewed: I have personally reviewed following labs and imaging studies  CBC:  Recent Labs Lab 09/17/16 0749 09/18/16 0221 09/19/16 0804  WBC 13.6* 28.4* 22.8*  NEUTROABS 13.2*  --   --   HGB 9.3* 7.6* 7.8*  HCT 28.5* 24.2* 24.8*  MCV 111.3* 114.7* 115.3*  PLT 240 167 150   Basic Metabolic Panel:  Recent Labs Lab 09/17/16 0749 09/18/16 0221 09/19/16 0804  NA 141 143 144  K 2.9* 3.8 3.6  CL 106 114* 115*  CO2 21* 20* 21*  GLUCOSE 109* 134* 164*  BUN 14 19 23*  CREATININE 1.77* 1.43* 0.99  CALCIUM 8.4* 7.7* 8.3*   GFR: Estimated Creatinine Clearance: 87.8 mL/min (by C-G formula based on SCr of 0.99 mg/dL). Liver Function Tests:  Recent Labs Lab 09/17/16 0749 09/18/16 0221  AST 115* 86*  ALT 41 39  ALKPHOS 329* 176*  BILITOT 1.4* 1.0  PROT 6.7 6.0*  ALBUMIN 2.3* 1.9*    Recent Labs Lab 09/17/16 0749  LIPASE 18    Recent Labs Lab 09/18/16 0849  AMMONIA 14   Coagulation Profile:  Recent Labs Lab 09/17/16 0749 09/17/16 1536  INR 1.30 1.28   Cardiac Enzymes:  Recent Labs Lab 09/17/16 0749 09/17/16 1536  TROPONINI 0.06* 0.06*   BNP (last 3 results) No results for input(s): PROBNP in the last 8760 hours. HbA1C: No results for input(s): HGBA1C in the last 72 hours. CBG: No results for input(s): GLUCAP in the last 168 hours. Lipid Profile: No results for input(s): CHOL, HDL, LDLCALC, TRIG, CHOLHDL, LDLDIRECT in the last 72 hours. Thyroid Function Tests: No results for input(s): TSH, T4TOTAL, FREET4, T3FREE, THYROIDAB in the last 72 hours. Anemia Panel: No results for input(s): VITAMINB12, FOLATE, FERRITIN, TIBC, IRON, RETICCTPCT in the last 72 hours. Sepsis Labs:  Recent Labs Lab 09/17/16 1005 09/17/16 1401 09/17/16 1536 09/18/16 0849  PROCALCITON  --   --  112.02  --   LATICACIDVEN 5.65* 3.49*  --  2.5*    Recent Results (from the past 240 hour(s))  Blood Culture (routine x 2)      Status: Abnormal (Preliminary result)   Collection Time: 09/17/16  9:49 AM  Result Value Ref Range Status   Specimen Description BLOOD BLOOD LEFT FOREARM  Final   Special Requests BOTTLES DRAWN AEROBIC AND ANAEROBIC 5CC  Final   Culture  Setup Time   Final    GRAM NEGATIVE RODS IN BOTH AEROBIC AND ANAEROBIC BOTTLES CRITICAL RESULT CALLED TO, READ BACK BY AND VERIFIED WITH: GREG ABBOTT,PHARMD @0122  09/18/16 MKELLY,MLT    Culture ESCHERICHIA COLI SUSCEPTIBILITIES TO FOLLOW  (A)  Final   Report Status PENDING  Incomplete  Blood Culture ID Panel (Reflexed)     Status: Abnormal   Collection Time: 09/17/16  9:49 AM  Result Value Ref Range Status   Enterococcus  species NOT DETECTED NOT DETECTED Final   Listeria monocytogenes NOT DETECTED NOT DETECTED Final   Staphylococcus species NOT DETECTED NOT DETECTED Final   Staphylococcus aureus NOT DETECTED NOT DETECTED Final   Streptococcus species NOT DETECTED NOT DETECTED Final   Streptococcus agalactiae NOT DETECTED NOT DETECTED Final   Streptococcus pneumoniae NOT DETECTED NOT DETECTED Final   Streptococcus pyogenes NOT DETECTED NOT DETECTED Final   Acinetobacter baumannii NOT DETECTED NOT DETECTED Final   Enterobacteriaceae species DETECTED (A) NOT DETECTED Final    Comment: CRITICAL RESULT CALLED TO, READ BACK BY AND VERIFIED WITH: GREG ABBOTT,PHARMD @0122  09/18/16 MKELLY,MLT    Enterobacter cloacae complex NOT DETECTED NOT DETECTED Final   Escherichia coli DETECTED (A) NOT DETECTED Final    Comment: CRITICAL RESULT CALLED TO, READ BACK BY AND VERIFIED WITH: GREG ABBOTT,PHARMD @0122  09/18/16 MKELLY,MLT    Klebsiella oxytoca NOT DETECTED NOT DETECTED Final   Klebsiella pneumoniae NOT DETECTED NOT DETECTED Final   Proteus species NOT DETECTED NOT DETECTED Final   Serratia marcescens NOT DETECTED NOT DETECTED Final   Carbapenem resistance NOT DETECTED NOT DETECTED Final   Haemophilus influenzae NOT DETECTED NOT DETECTED Final   Neisseria  meningitidis NOT DETECTED NOT DETECTED Final   Pseudomonas aeruginosa NOT DETECTED NOT DETECTED Final   Candida albicans NOT DETECTED NOT DETECTED Final   Candida glabrata NOT DETECTED NOT DETECTED Final   Candida krusei NOT DETECTED NOT DETECTED Final   Candida parapsilosis NOT DETECTED NOT DETECTED Final   Candida tropicalis NOT DETECTED NOT DETECTED Final  Blood Culture (routine x 2)     Status: None (Preliminary result)   Collection Time: 09/17/16 10:00 AM  Result Value Ref Range Status   Specimen Description BLOOD BLOOD LEFT FOREARM  Final   Special Requests BOTTLES DRAWN AEROBIC AND ANAEROBIC 5CC  Final   Culture  Setup Time   Final    GRAM NEGATIVE RODS ANAEROBIC BOTTLE ONLY CRITICAL VALUE NOTED.  VALUE IS CONSISTENT WITH PREVIOUSLY REPORTED AND CALLED VALUE.    Culture GRAM NEGATIVE RODS  Final   Report Status PENDING  Incomplete  Urine culture     Status: Abnormal   Collection Time: 09/17/16 11:00 AM  Result Value Ref Range Status   Specimen Description URINE, CATHETERIZED  Final   Special Requests NONE  Final   Culture >=100,000 COLONIES/mL ESCHERICHIA COLI (A)  Final   Report Status 09/19/2016 FINAL  Final   Organism ID, Bacteria ESCHERICHIA COLI (A)  Final      Susceptibility   Escherichia coli - MIC*    AMPICILLIN >=32 RESISTANT Resistant     CEFAZOLIN <=4 SENSITIVE Sensitive     CEFTRIAXONE <=1 SENSITIVE Sensitive     CIPROFLOXACIN <=0.25 SENSITIVE Sensitive     GENTAMICIN <=1 SENSITIVE Sensitive     IMIPENEM <=0.25 SENSITIVE Sensitive     NITROFURANTOIN <=16 SENSITIVE Sensitive     TRIMETH/SULFA <=20 SENSITIVE Sensitive     AMPICILLIN/SULBACTAM 16 INTERMEDIATE Intermediate     PIP/TAZO <=4 SENSITIVE Sensitive     Extended ESBL NEGATIVE Sensitive     * >=100,000 COLONIES/mL ESCHERICHIA COLI  MRSA PCR Screening     Status: None   Collection Time: 09/17/16 11:24 PM  Result Value Ref Range Status   MRSA by PCR NEGATIVE NEGATIVE Final    Comment:        The  GeneXpert MRSA Assay (FDA approved for NASAL specimens only), is one component of a comprehensive MRSA colonization surveillance program. It is not intended  to diagnose MRSA infection nor to guide or monitor treatment for MRSA infections.          Radiology Studies: Dg Chest Port 1 View  Result Date: 09/18/2016 CLINICAL DATA:  65 year old male admitted yesterday with sepsis and encephalopathy. Hypoxia. Initial encounter. EXAM: PORTABLE CHEST 1 VIEW COMPARISON:  09/17/2016 and earlier. FINDINGS: Portable AP upright view at 0938 hours. Mildly displaced fracture of the right lateral fifth or sixth rib (arrow) probably was present on the earliest comparison dated 09/02/2016. Subacute to chronic left lateral seventh rib fracture with callus. Calcified aortic atherosclerosis. Stable cardiomegaly. Hiatal hernia suspected. Stable mediastinal contours. Patchy bibasilar pulmonary opacity. No pneumothorax or pleural effusion. Stable pulmonary vascularity without overt edema. IMPRESSION: 1. Patchy bilateral lung base opacity could reflect atelectasis or infection. Vascular congestion without overt edema. No pleural effusion evident. 2. Hiatal hernia suspected. Cardiomegaly. Calcified aortic atherosclerosis. 3. Subacute and/or chronic bilateral lateral rib fractures. Electronically Signed   By: Odessa Fleming M.D.   On: 09/18/2016 09:55   Dg Hand Complete Right  Result Date: 09/17/2016 CLINICAL DATA:  Swelling, pain and redness in index finger. No known injury. EXAM: RIGHT HAND - COMPLETE 3+ VIEW COMPARISON:  None. FINDINGS: There is no evidence of fracture or dislocation. There is no evidence of arthropathy or other focal bone abnormality. Soft tissues are unremarkable. IMPRESSION: Negative. Electronically Signed   By: Charlett Nose M.D.   On: 09/17/2016 18:19        Scheduled Meds: . cefTRIAXone (ROCEPHIN)  IV  2 g Intravenous Q24H  . chlordiazePOXIDE  10 mg Oral TID  . colchicine  0.6 mg Oral Daily    . heparin  5,000 Units Subcutaneous Q8H  . hydrocortisone sod succinate (SOLU-CORTEF) inj  50 mg Intravenous Q6H  . levothyroxine  300 mcg Oral QAC breakfast  . mouth rinse  15 mL Mouth Rinse BID  . sodium chloride flush  3 mL Intravenous Q12H   Continuous Infusions:    LOS: 2 days    Time spent: 35 minutes.     Alba Cory, MD [facility] Pager [phone]  If 7PM-7AM, please contact night-coverage [email or web address] Password [facility] 09/19/2016, 3:19 PM

## 2016-09-20 DIAGNOSIS — N390 Urinary tract infection, site not specified: Secondary | ICD-10-CM

## 2016-09-20 LAB — BASIC METABOLIC PANEL
Anion gap: 8 (ref 5–15)
BUN: 26 mg/dL — AB (ref 6–20)
CHLORIDE: 116 mmol/L — AB (ref 101–111)
CO2: 22 mmol/L (ref 22–32)
CREATININE: 0.87 mg/dL (ref 0.61–1.24)
Calcium: 8.5 mg/dL — ABNORMAL LOW (ref 8.9–10.3)
GFR calc Af Amer: >60 mL/min (ref >60)
GFR calc non Af Amer: >60 mL/min (ref >60)
Glucose, Bld: 117 mg/dL — ABNORMAL HIGH (ref 65–99)
Potassium: 3.2 mmol/L — ABNORMAL LOW (ref 3.5–5.1)
SODIUM: 146 mmol/L — AB (ref 135–145)

## 2016-09-20 LAB — CBC
HCT: 25.5 % — ABNORMAL LOW (ref 39.0–52.0)
HEMOGLOBIN: 7.9 g/dL — AB (ref 13.0–17.0)
MCH: 35.3 pg — AB (ref 26.0–34.0)
MCHC: 31 g/dL (ref 30.0–36.0)
MCV: 113.8 fL — ABNORMAL HIGH (ref 78.0–100.0)
Platelets: 150 10*3/uL (ref 150–400)
RBC: 2.24 MIL/uL — ABNORMAL LOW (ref 4.22–5.81)
RDW: 14.5 % (ref 11.5–15.5)
WBC: 18 10*3/uL — ABNORMAL HIGH (ref 4.0–10.5)

## 2016-09-20 LAB — LACTIC ACID, PLASMA: LACTIC ACID, VENOUS: 1.2 mmol/L (ref 0.5–1.9)

## 2016-09-20 MED ORDER — POTASSIUM CHLORIDE CRYS ER 20 MEQ PO TBCR
40.0000 meq | EXTENDED_RELEASE_TABLET | Freq: Once | ORAL | Status: AC
  Administered 2016-09-20: 40 meq via ORAL
  Filled 2016-09-20: qty 2

## 2016-09-20 MED ORDER — CYANOCOBALAMIN 1000 MCG/ML IJ SOLN
1000.0000 ug | Freq: Every day | INTRAMUSCULAR | Status: AC
  Administered 2016-09-20 – 2016-09-22 (×3): 1000 ug via SUBCUTANEOUS
  Filled 2016-09-20 (×3): qty 1

## 2016-09-20 MED ORDER — FOLIC ACID 1 MG PO TABS
1.0000 mg | ORAL_TABLET | Freq: Every day | ORAL | Status: DC
  Administered 2016-09-20 – 2016-09-23 (×4): 1 mg via ORAL
  Filled 2016-09-20 (×4): qty 1

## 2016-09-20 NOTE — Progress Notes (Signed)
PROGRESS NOTE    Mark Meadows  ZOX:[id number] Mark BaliDOB: 10-Nov-1950 DOA: 09/17/2016 PCP: Elizabeth PalauANDERSON,TERESA, FNP   Brief Narrative: Mark BaliJeffrey Meadows is a 65 y.o. male with medical history significant of EtOH abuse, alcohol induced hepatitis, atrial fibrillation, GERD, hypertension, hypokalemia, thrombocytopenia, hypothyroidism, gout, presenting from [facility] acutely encephalopathic and unable to provide reliable history other than "I don't feel well:. Sent from SNF for altered mental status. Fever of 101.9 in ER.  Assessment & Plan:   Sepsis due to UTI and E coli bateremia  Secondary to UTI, UA with too numerous to count WBC.  Urine culture and blood cultures: E coli.  Change cefepime to ceftriaxone based on sensitivities  Lactic acid trending down form 5 to 2.5 to 1.2  Stress dose steroids being tapered    Acute Hypoxic respiratory failure;  Chest x ray Patchy bilateral lung base opacity could reflect atelectasis or infection. Vascular congestion without overt edema. No pleural effusion evident.d/c IVF - incentive spirometry  Oxygen on RA now 95 % .  Resumed spironolactone.   Right had edema- gout? -  redness; x ray negative. Continue with IV antibiotics.  Treating for gout also.  Improving.   h/o ETOH abuse   AKI - due to sepsis?- renal function improved.   Acute encephalopathy;  Secondary to infection, toxic. Improving.   Anemia - 9.3 >>> 7.8  ? component of hemodilution - anemia panel shows folate and B12 deficiency (due to ETOH abuse?) -  start replacement   Cirrhosis- alcoholic -Resume spironolactone.   Hypothyroidism; continue with synthroid.   Elevated troponin -  in the event of sepsis.     DVT prophylaxis: Heparin  Code Status: DNR Family Communication: care discussed with patient./  Disposition Plan: back to snf when stable. Transfer to med-surgery   Consultants:   Palliative    Procedures:  none   Antimicrobials Anti-infectives    Start     Dose/Rate Route Frequency Ordered Stop   09/19/16 1530  cefTRIAXone (ROCEPHIN) 2 g in dextrose 5 % 50 mL IVPB     2 g 100 mL/hr over 30 Minutes Intravenous Every 24 hours 09/19/16 1515     09/19/16 1400  ceFEPIme (MAXIPIME) 1 g in dextrose 5 % 50 mL IVPB  Status:  Discontinued     1 g 100 mL/hr over 30 Minutes Intravenous Every 8 hours 09/19/16 1025 09/19/16 1514   09/18/16 2200  ceFEPIme (MAXIPIME) 1 g in dextrose 5 % 50 mL IVPB  Status:  Discontinued     1 g 100 mL/hr over 30 Minutes Intravenous Every 12 hours 09/18/16 1525 09/19/16 1025   09/18/16 1015  vancomycin (VANCOCIN) 1,250 mg in sodium chloride 0.9 % 250 mL IVPB  Status:  Discontinued     1,250 mg 166.7 mL/hr over 90 Minutes Intravenous Every 24 hours 09/17/16 1104 09/17/16 1506   09/18/16 1000  ceFEPIme (MAXIPIME) 1 g in dextrose 5 % 50 mL IVPB  Status:  Discontinued     1 g 100 mL/hr over 30 Minutes Intravenous Every 24 hours 09/17/16 1512 09/18/16 1525   09/17/16 1815  piperacillin-tazobactam (ZOSYN) IVPB 3.375 g  Status:  Discontinued     3.375 g 12.5 mL/hr over 240 Minutes Intravenous Every 8 hours 09/17/16 1104 09/17/16 1506   09/17/16 1545  ceFEPIme (MAXIPIME) 2 g in dextrose 5 % 50 mL IVPB     2 g 100 mL/hr over 30 Minutes Intravenous  Once 09/17/16 1506 09/17/16 1610   09/17/16 1015  vancomycin (  VANCOCIN) 1,500 mg in sodium chloride 0.9 % 500 mL IVPB     1,500 mg 250 mL/hr over 120 Minutes Intravenous  Once 09/17/16 1004 09/17/16 1224   09/17/16 1000  piperacillin-tazobactam (ZOSYN) IVPB 3.375 g     3.375 g 100 mL/hr over 30 Minutes Intravenous  Once 09/17/16 0954 09/17/16 1046   09/17/16 1000  vancomycin (VANCOCIN) IVPB 1000 mg/200 mL premix  Status:  Discontinued     1,000 mg 200 mL/hr over 60 Minutes Intravenous  Once 09/17/16 0954 09/17/16 1004          Subjective: He has no complaints today.   Objective: Vitals:   09/19/16 1903 09/19/16 2120 09/20/16 0444 09/20/16 1428  BP: 113/84 127/88  135/90 (!) 135/97  Pulse: 81 82 73 75  Resp: (!) 22 20 18 18   Temp: 98.4 F (36.9 C) 98.4 F (36.9 C) 97.7 F (36.5 C) 97.8 F (36.6 C)  TempSrc: Oral Oral Oral Oral  SpO2: 95% 93% 92% 94%  Weight:      Height:        Intake/Output Summary (Last 24 hours) at 09/20/16 1745 Last data filed at 09/20/16 1608  Gross per 24 hour  Intake              390 ml  Output             1551 ml  Net            -1161 ml   Filed Weights   09/17/16 1019 09/17/16 2330  Weight: 104.3 kg (230 lb) 102.1 kg (225 lb)    Examination:  General exam: Appears calm and comfortable  Respiratory system: Clear to auscultation. Respiratory effort normal. Cardiovascular system: S1 & S2 heard, RRR. No JVD, murmurs, rubs, gallops or clicks. No pedal edema. Gastrointestinal system: Abdomen is nondistended, soft and nontender. No organomegaly or masses felt. Normal bowel sounds heard. Central nervous system: Alert and oriented. No focal neurological deficits. Extremities: Symmetric 5 x 5 power. Skin: No rashes, lesions or ulcers Psychiatry: Judgement and insight appear normal. Mood & affect appropriate.     Data Reviewed: I have personally reviewed following labs and imaging studies  CBC:  Recent Labs Lab 09/17/16 0749 09/18/16 0221 09/19/16 0804 09/20/16 0655  WBC 13.6* 28.4* 22.8* 18.0*  NEUTROABS 13.2*  --   --   --   HGB 9.3* 7.6* 7.8* 7.9*  HCT 28.5* 24.2* 24.8* 25.5*  MCV 111.3* 114.7* 115.3* 113.8*  PLT 240 167 150 150   Basic Metabolic Panel:  Recent Labs Lab 09/17/16 0749 09/18/16 0221 09/19/16 0804 09/20/16 0655  NA 141 143 144 146*  K 2.9* 3.8 3.6 3.2*  CL 106 114* 115* 116*  CO2 21* 20* 21* 22  GLUCOSE 109* 134* 164* 117*  BUN 14 19 23* 26*  CREATININE 1.77* 1.43* 0.99 0.87  CALCIUM 8.4* 7.7* 8.3* 8.5*   GFR: Estimated Creatinine Clearance: 99.9 mL/min (by C-G formula based on SCr of 0.87 mg/dL). Liver Function Tests:  Recent Labs Lab 09/17/16 0749 09/18/16 0221    AST 115* 86*  ALT 41 39  ALKPHOS 329* 176*  BILITOT 1.4* 1.0  PROT 6.7 6.0*  ALBUMIN 2.3* 1.9*    Recent Labs Lab 09/17/16 0749  LIPASE 18    Recent Labs Lab 09/18/16 0849  AMMONIA 14   Coagulation Profile:  Recent Labs Lab 09/17/16 0749 09/17/16 1536  INR 1.30 1.28   Cardiac Enzymes:  Recent Labs Lab 09/17/16 0749 09/17/16 1536  TROPONINI 0.06* 0.06*   BNP (last 3 results) No results for input(s): PROBNP in the last 8760 hours. HbA1C: No results for input(s): HGBA1C in the last 72 hours. CBG: No results for input(s): GLUCAP in the last 168 hours. Lipid Profile: No results for input(s): CHOL, HDL, LDLCALC, TRIG, CHOLHDL, LDLDIRECT in the last 72 hours. Thyroid Function Tests: No results for input(s): TSH, T4TOTAL, FREET4, T3FREE, THYROIDAB in the last 72 hours. Anemia Panel: No results for input(s): VITAMINB12, FOLATE, FERRITIN, TIBC, IRON, RETICCTPCT in the last 72 hours. Sepsis Labs:  Recent Labs Lab 09/17/16 1005 09/17/16 1401 09/17/16 1536 09/18/16 0849 09/20/16 0825  PROCALCITON  --   --  112.02  --   --   LATICACIDVEN 5.65* 3.49*  --  2.5* 1.2    Recent Results (from the past 240 hour(s))  Blood Culture (routine x 2)     Status: Abnormal (Preliminary result)   Collection Time: 09/17/16  9:49 AM  Result Value Ref Range Status   Specimen Description BLOOD BLOOD LEFT FOREARM  Final   Special Requests BOTTLES DRAWN AEROBIC AND ANAEROBIC 5CC  Final   Culture  Setup Time   Final    GRAM NEGATIVE RODS IN BOTH AEROBIC AND ANAEROBIC BOTTLES CRITICAL RESULT CALLED TO, READ BACK BY AND VERIFIED WITH: GREG ABBOTT,PHARMD @0122  09/18/16 MKELLY,MLT    Culture (A)  Final    ESCHERICHIA COLI CULTURE REINCUBATED FOR BETTER GROWTH    Report Status PENDING  Incomplete   Organism ID, Bacteria ESCHERICHIA COLI  Final      Susceptibility   Escherichia coli - MIC*    AMPICILLIN >=32 RESISTANT Resistant     CEFAZOLIN <=4 SENSITIVE Sensitive     CEFEPIME  <=1 SENSITIVE Sensitive     CEFTAZIDIME <=1 SENSITIVE Sensitive     CEFTRIAXONE <=1 SENSITIVE Sensitive     CIPROFLOXACIN <=0.25 SENSITIVE Sensitive     GENTAMICIN <=1 SENSITIVE Sensitive     IMIPENEM <=0.25 SENSITIVE Sensitive     TRIMETH/SULFA <=20 SENSITIVE Sensitive     AMPICILLIN/SULBACTAM 16 INTERMEDIATE Intermediate     PIP/TAZO <=4 SENSITIVE Sensitive     Extended ESBL NEGATIVE Sensitive     * ESCHERICHIA COLI  Blood Culture ID Panel (Reflexed)     Status: Abnormal   Collection Time: 09/17/16  9:49 AM  Result Value Ref Range Status   Enterococcus species NOT DETECTED NOT DETECTED Final   Listeria monocytogenes NOT DETECTED NOT DETECTED Final   Staphylococcus species NOT DETECTED NOT DETECTED Final   Staphylococcus aureus NOT DETECTED NOT DETECTED Final   Streptococcus species NOT DETECTED NOT DETECTED Final   Streptococcus agalactiae NOT DETECTED NOT DETECTED Final   Streptococcus pneumoniae NOT DETECTED NOT DETECTED Final   Streptococcus pyogenes NOT DETECTED NOT DETECTED Final   Acinetobacter baumannii NOT DETECTED NOT DETECTED Final   Enterobacteriaceae species DETECTED (A) NOT DETECTED Final    Comment: CRITICAL RESULT CALLED TO, READ BACK BY AND VERIFIED WITH: GREG ABBOTT,PHARMD @0122  09/18/16 MKELLY,MLT    Enterobacter cloacae complex NOT DETECTED NOT DETECTED Final   Escherichia coli DETECTED (A) NOT DETECTED Final    Comment: CRITICAL RESULT CALLED TO, READ BACK BY AND VERIFIED WITH: GREG ABBOTT,PHARMD @0122  09/18/16 MKELLY,MLT    Klebsiella oxytoca NOT DETECTED NOT DETECTED Final   Klebsiella pneumoniae NOT DETECTED NOT DETECTED Final   Proteus species NOT DETECTED NOT DETECTED Final   Serratia marcescens NOT DETECTED NOT DETECTED Final   Carbapenem resistance NOT DETECTED NOT DETECTED Final   Haemophilus  influenzae NOT DETECTED NOT DETECTED Final   Neisseria meningitidis NOT DETECTED NOT DETECTED Final   Pseudomonas aeruginosa NOT DETECTED NOT DETECTED Final     Candida albicans NOT DETECTED NOT DETECTED Final   Candida glabrata NOT DETECTED NOT DETECTED Final   Candida krusei NOT DETECTED NOT DETECTED Final   Candida parapsilosis NOT DETECTED NOT DETECTED Final   Candida tropicalis NOT DETECTED NOT DETECTED Final  Blood Culture (routine x 2)     Status: Abnormal (Preliminary result)   Collection Time: 09/17/16 10:00 AM  Result Value Ref Range Status   Specimen Description BLOOD BLOOD LEFT FOREARM  Final   Special Requests BOTTLES DRAWN AEROBIC AND ANAEROBIC 5CC  Final   Culture  Setup Time   Final    GRAM NEGATIVE RODS ANAEROBIC BOTTLE ONLY CRITICAL VALUE NOTED.  VALUE IS CONSISTENT WITH PREVIOUSLY REPORTED AND CALLED VALUE.    Culture (A)  Final    ESCHERICHIA COLI SUSCEPTIBILITIES PERFORMED ON PREVIOUS CULTURE WITHIN THE LAST 5 DAYS. CULTURE REINCUBATED FOR BETTER GROWTH    Report Status PENDING  Incomplete  Urine culture     Status: Abnormal   Collection Time: 09/17/16 11:00 AM  Result Value Ref Range Status   Specimen Description URINE, CATHETERIZED  Final   Special Requests NONE  Final   Culture >=100,000 COLONIES/mL ESCHERICHIA COLI (A)  Final   Report Status 09/19/2016 FINAL  Final   Organism ID, Bacteria ESCHERICHIA COLI (A)  Final      Susceptibility   Escherichia coli - MIC*    AMPICILLIN >=32 RESISTANT Resistant     CEFAZOLIN <=4 SENSITIVE Sensitive     CEFTRIAXONE <=1 SENSITIVE Sensitive     CIPROFLOXACIN <=0.25 SENSITIVE Sensitive     GENTAMICIN <=1 SENSITIVE Sensitive     IMIPENEM <=0.25 SENSITIVE Sensitive     NITROFURANTOIN <=16 SENSITIVE Sensitive     TRIMETH/SULFA <=20 SENSITIVE Sensitive     AMPICILLIN/SULBACTAM 16 INTERMEDIATE Intermediate     PIP/TAZO <=4 SENSITIVE Sensitive     Extended ESBL NEGATIVE Sensitive     * >=100,000 COLONIES/mL ESCHERICHIA COLI  MRSA PCR Screening     Status: None   Collection Time: 09/17/16 11:24 PM  Result Value Ref Range Status   MRSA by PCR NEGATIVE NEGATIVE Final     Comment:        The GeneXpert MRSA Assay (FDA approved for NASAL specimens only), is one component of a comprehensive MRSA colonization surveillance program. It is not intended to diagnose MRSA infection nor to guide or monitor treatment for MRSA infections.          Radiology Studies: No results found.      Scheduled Meds: . cefTRIAXone (ROCEPHIN)  IV  2 g Intravenous Q24H  . heparin  5,000 Units Subcutaneous Q8H  . levothyroxine  300 mcg Oral QAC breakfast  . mouth rinse  15 mL Mouth Rinse BID  . metoprolol tartrate  12.5 mg Oral BID  . sodium chloride flush  3 mL Intravenous Q12H  . spironolactone  25 mg Oral Daily   Continuous Infusions:    LOS: 3 days    Time spent: 35 minutes.     Calvert Cantor, MD [facility] Pager [phone]  If 7PM-7AM, please contact night-coverage [email or web address] Password [email or web address] 09/20/2016, 5:45 PM

## 2016-09-21 DIAGNOSIS — E876 Hypokalemia: Secondary | ICD-10-CM

## 2016-09-21 DIAGNOSIS — E870 Hyperosmolality and hypernatremia: Secondary | ICD-10-CM

## 2016-09-21 DIAGNOSIS — I10 Essential (primary) hypertension: Secondary | ICD-10-CM

## 2016-09-21 LAB — BASIC METABOLIC PANEL
Anion gap: 8 (ref 5–15)
BUN: 19 mg/dL (ref 6–20)
CALCIUM: 8.7 mg/dL — AB (ref 8.9–10.3)
CO2: 24 mmol/L (ref 22–32)
CREATININE: 0.83 mg/dL (ref 0.61–1.24)
Chloride: 115 mmol/L — ABNORMAL HIGH (ref 101–111)
GFR calc Af Amer: >60 mL/min (ref >60)
GLUCOSE: 86 mg/dL (ref 65–99)
Potassium: 3.3 mmol/L — ABNORMAL LOW (ref 3.5–5.1)
Sodium: 147 mmol/L — ABNORMAL HIGH (ref 135–145)

## 2016-09-21 LAB — CBC
HEMATOCRIT: 27 % — AB (ref 39.0–52.0)
Hemoglobin: 8.4 g/dL — ABNORMAL LOW (ref 13.0–17.0)
MCH: 35.4 pg — ABNORMAL HIGH (ref 26.0–34.0)
MCHC: 31.1 g/dL (ref 30.0–36.0)
MCV: 113.9 fL — ABNORMAL HIGH (ref 78.0–100.0)
PLATELETS: 157 10*3/uL (ref 150–400)
RBC: 2.37 MIL/uL — ABNORMAL LOW (ref 4.22–5.81)
RDW: 14.7 % (ref 11.5–15.5)
WBC: 14.1 10*3/uL — ABNORMAL HIGH (ref 4.0–10.5)

## 2016-09-21 MED ORDER — DEXTROSE 5 % IV SOLN
INTRAVENOUS | Status: DC
  Administered 2016-09-21 – 2016-09-22 (×2): via INTRAVENOUS
  Filled 2016-09-21 (×6): qty 1000

## 2016-09-21 MED ORDER — CEPHALEXIN 500 MG PO CAPS
1000.0000 mg | ORAL_CAPSULE | Freq: Two times a day (BID) | ORAL | Status: DC
  Administered 2016-09-21 – 2016-09-23 (×4): 1000 mg via ORAL
  Filled 2016-09-21 (×4): qty 2

## 2016-09-21 NOTE — Progress Notes (Signed)
PROGRESS NOTE    Mark Meadows  WUJ:[id number] DOB: 1951-02-03 DOA: 09/17/2016 PCP: Elizabeth Palau, FNP   Brief Narrative: Mark Meadows is a 65 y.o. male with medical history significant of EtOH abuse, alcohol induced hepatitis, atrial fibrillation, GERD, hypertension, hypokalemia, thrombocytopenia, hypothyroidism, gout, presenting from [facility] acutely encephalopathic and unable to provide reliable history other than "I don't feel well:. Sent from SNF for altered mental status. Fever of 101.9 in ER.  Assessment & Plan:   Sepsis due to UTI and E coli bateremia  Cefepime started on 12/27 Changed cefepime to ceftriaxone based on sensitivities - will change to Keflex today for total 2 wk course Lactic acid trending down form 5 to 2.5 to 1.2  Stress dose steroids d/c 'd   Hypernatremia and hypokalemia - start D5W with K and follow- follow closely - hold Spironolactone  Acute Hypoxic respiratory failure;   - ? Due to sepsis - Oxygen on RA now 95 % .    AKI - likely due to sepsi- renal function improved to normal  Acute encephalopathy;  Secondary to infection?- uncertain of baseline but he is oriented to person only  Anemia- folic acid and B12 deficiency - 9.3 >>> 8.4 - anemia panel shows folate and B12 deficiency (due to ETOH abuse?) -  started replacement   Cirrhosis- alcoholic   Hypothyroidism; continue with synthroid.   Elevated troponin -  Mild - in setting of sepsis.     DVT prophylaxis: Heparin  Code Status: DNR Family Communication:   Disposition Plan: back to snf when stable.    Consultants:   Palliative    Procedures:  none   Antimicrobials Anti-infectives    Start     Dose/Rate Route Frequency Ordered Stop   09/19/16 1530  cefTRIAXone (ROCEPHIN) 2 g in dextrose 5 % 50 mL IVPB     2 g 100 mL/hr over 30 Minutes Intravenous Every 24 hours 09/19/16 1515     09/19/16 1400  ceFEPIme (MAXIPIME) 1 g in dextrose 5 % 50 mL IVPB  Status:   Discontinued     1 g 100 mL/hr over 30 Minutes Intravenous Every 8 hours 09/19/16 1025 09/19/16 1514   09/18/16 2200  ceFEPIme (MAXIPIME) 1 g in dextrose 5 % 50 mL IVPB  Status:  Discontinued     1 g 100 mL/hr over 30 Minutes Intravenous Every 12 hours 09/18/16 1525 09/19/16 1025   09/18/16 1015  vancomycin (VANCOCIN) 1,250 mg in sodium chloride 0.9 % 250 mL IVPB  Status:  Discontinued     1,250 mg 166.7 mL/hr over 90 Minutes Intravenous Every 24 hours 09/17/16 1104 09/17/16 1506   09/18/16 1000  ceFEPIme (MAXIPIME) 1 g in dextrose 5 % 50 mL IVPB  Status:  Discontinued     1 g 100 mL/hr over 30 Minutes Intravenous Every 24 hours 09/17/16 1512 09/18/16 1525   09/17/16 1815  piperacillin-tazobactam (ZOSYN) IVPB 3.375 g  Status:  Discontinued     3.375 g 12.5 mL/hr over 240 Minutes Intravenous Every 8 hours 09/17/16 1104 09/17/16 1506   09/17/16 1545  ceFEPIme (MAXIPIME) 2 g in dextrose 5 % 50 mL IVPB     2 g 100 mL/hr over 30 Minutes Intravenous  Once 09/17/16 1506 09/17/16 1610   09/17/16 1015  vancomycin (VANCOCIN) 1,500 mg in sodium chloride 0.9 % 500 mL IVPB     1,500 mg 250 mL/hr over 120 Minutes Intravenous  Once 09/17/16 1004 09/17/16 1224   09/17/16 1000  piperacillin-tazobactam (ZOSYN)  IVPB 3.375 g     3.375 g 100 mL/hr over 30 Minutes Intravenous  Once 09/17/16 0954 09/17/16 1046   09/17/16 1000  vancomycin (VANCOCIN) IVPB 1000 mg/200 mL premix  Status:  Discontinued     1,000 mg 200 mL/hr over 60 Minutes Intravenous  Once 09/17/16 0954 09/17/16 1004         Subjective: He has no complaints today. Confused to time and place.   Objective: Vitals:   09/20/16 1801 09/20/16 2036 09/21/16 0519 09/21/16 1453  BP:  (!) 147/99 138/89 (!) 147/93  Pulse:  77 74 67  Resp:  17 19 18   Temp:  97.6 F (36.4 C) 98 F (36.7 C) 98.1 F (36.7 C)  TempSrc:  Oral  Oral  SpO2:  92% 93% 95%  Weight: 102 kg (224 lb 14.4 oz)     Height:        Intake/Output Summary (Last 24 hours)  at 09/21/16 1608 Last data filed at 09/21/16 1446  Gross per 24 hour  Intake               60 ml  Output              400 ml  Net             -340 ml   Filed Weights   09/17/16 1019 09/17/16 2330 09/20/16 1801  Weight: 104.3 kg (230 lb) 102.1 kg (225 lb) 102 kg (224 lb 14.4 oz)    Examination:  General exam: Appears calm and comfortable  Respiratory system: Clear to auscultation. Respiratory effort normal. Cardiovascular system: S1 & S2 heard, RRR. No JVD, murmurs, rubs, gallops or clicks. No pedal edema. Gastrointestinal system: Abdomen is nondistended, soft and nontender. No organomegaly or masses felt. Normal bowel sounds heard. Central nervous system: Alert and oriented to peson. No focal neurological deficits. Extremities: Symmetric 5 x 5 power. Skin: No rashes, lesions or ulcers Psychiatry: Judgement and insight poor.    Data Reviewed: I have personally reviewed following labs and imaging studies  CBC:  Recent Labs Lab 09/17/16 0749 09/18/16 0221 09/19/16 0804 09/20/16 0655 09/21/16 0603  WBC 13.6* 28.4* 22.8* 18.0* 14.1*  NEUTROABS 13.2*  --   --   --   --   HGB 9.3* 7.6* 7.8* 7.9* 8.4*  HCT 28.5* 24.2* 24.8* 25.5* 27.0*  MCV 111.3* 114.7* 115.3* 113.8* 113.9*  PLT 240 167 150 150 157   Basic Metabolic Panel:  Recent Labs Lab 09/17/16 0749 09/18/16 0221 09/19/16 0804 09/20/16 0655 09/21/16 0603  NA 141 143 144 146* 147*  K 2.9* 3.8 3.6 3.2* 3.3*  CL 106 114* 115* 116* 115*  CO2 21* 20* 21* 22 24  GLUCOSE 109* 134* 164* 117* 86  BUN 14 19 23* 26* 19  CREATININE 1.77* 1.43* 0.99 0.87 0.83  CALCIUM 8.4* 7.7* 8.3* 8.5* 8.7*   GFR: Estimated Creatinine Clearance: 104.8 mL/min (by C-G formula based on SCr of 0.83 mg/dL). Liver Function Tests:  Recent Labs Lab 09/17/16 0749 09/18/16 0221  AST 115* 86*  ALT 41 39  ALKPHOS 329* 176*  BILITOT 1.4* 1.0  PROT 6.7 6.0*  ALBUMIN 2.3* 1.9*    Recent Labs Lab 09/17/16 0749  LIPASE 18    Recent  Labs Lab 09/18/16 0849  AMMONIA 14   Coagulation Profile:  Recent Labs Lab 09/17/16 0749 09/17/16 1536  INR 1.30 1.28   Cardiac Enzymes:  Recent Labs Lab 09/17/16 0749 09/17/16 1536  TROPONINI 0.06* 0.06*  BNP (last 3 results) No results for input(s): PROBNP in the last 8760 hours. HbA1C: No results for input(s): HGBA1C in the last 72 hours. CBG: No results for input(s): GLUCAP in the last 168 hours. Lipid Profile: No results for input(s): CHOL, HDL, LDLCALC, TRIG, CHOLHDL, LDLDIRECT in the last 72 hours. Thyroid Function Tests: No results for input(s): TSH, T4TOTAL, FREET4, T3FREE, THYROIDAB in the last 72 hours. Anemia Panel: No results for input(s): VITAMINB12, FOLATE, FERRITIN, TIBC, IRON, RETICCTPCT in the last 72 hours. Sepsis Labs:  Recent Labs Lab 09/17/16 1005 09/17/16 1401 09/17/16 1536 09/18/16 0849 09/20/16 0825  PROCALCITON  --   --  112.02  --   --   LATICACIDVEN 5.65* 3.49*  --  2.5* 1.2    Recent Results (from the past 240 hour(s))  Blood Culture (routine x 2)     Status: Abnormal (Preliminary result)   Collection Time: 09/17/16  9:49 AM  Result Value Ref Range Status   Specimen Description BLOOD BLOOD LEFT FOREARM  Final   Special Requests BOTTLES DRAWN AEROBIC AND ANAEROBIC 5CC  Final   Culture  Setup Time   Final    GRAM NEGATIVE RODS IN BOTH AEROBIC AND ANAEROBIC BOTTLES CRITICAL RESULT CALLED TO, READ BACK BY AND VERIFIED WITH: GREG ABBOTT,PHARMD @0122  09/18/16 MKELLY,MLT    Culture (A)  Final    ESCHERICHIA COLI CULTURE REINCUBATED FOR BETTER GROWTH    Report Status PENDING  Incomplete   Organism ID, Bacteria ESCHERICHIA COLI  Final      Susceptibility   Escherichia coli - MIC*    AMPICILLIN >=32 RESISTANT Resistant     CEFAZOLIN <=4 SENSITIVE Sensitive     CEFEPIME <=1 SENSITIVE Sensitive     CEFTAZIDIME <=1 SENSITIVE Sensitive     CEFTRIAXONE <=1 SENSITIVE Sensitive     CIPROFLOXACIN <=0.25 SENSITIVE Sensitive      GENTAMICIN <=1 SENSITIVE Sensitive     IMIPENEM <=0.25 SENSITIVE Sensitive     TRIMETH/SULFA <=20 SENSITIVE Sensitive     AMPICILLIN/SULBACTAM 16 INTERMEDIATE Intermediate     PIP/TAZO <=4 SENSITIVE Sensitive     Extended ESBL NEGATIVE Sensitive     * ESCHERICHIA COLI  Blood Culture ID Panel (Reflexed)     Status: Abnormal   Collection Time: 09/17/16  9:49 AM  Result Value Ref Range Status   Enterococcus species NOT DETECTED NOT DETECTED Final   Listeria monocytogenes NOT DETECTED NOT DETECTED Final   Staphylococcus species NOT DETECTED NOT DETECTED Final   Staphylococcus aureus NOT DETECTED NOT DETECTED Final   Streptococcus species NOT DETECTED NOT DETECTED Final   Streptococcus agalactiae NOT DETECTED NOT DETECTED Final   Streptococcus pneumoniae NOT DETECTED NOT DETECTED Final   Streptococcus pyogenes NOT DETECTED NOT DETECTED Final   Acinetobacter baumannii NOT DETECTED NOT DETECTED Final   Enterobacteriaceae species DETECTED (A) NOT DETECTED Final    Comment: CRITICAL RESULT CALLED TO, READ BACK BY AND VERIFIED WITH: GREG ABBOTT,PHARMD @0122  09/18/16 MKELLY,MLT    Enterobacter cloacae complex NOT DETECTED NOT DETECTED Final   Escherichia coli DETECTED (A) NOT DETECTED Final    Comment: CRITICAL RESULT CALLED TO, READ BACK BY AND VERIFIED WITH: GREG ABBOTT,PHARMD @0122  09/18/16 MKELLY,MLT    Klebsiella oxytoca NOT DETECTED NOT DETECTED Final   Klebsiella pneumoniae NOT DETECTED NOT DETECTED Final   Proteus species NOT DETECTED NOT DETECTED Final   Serratia marcescens NOT DETECTED NOT DETECTED Final   Carbapenem resistance NOT DETECTED NOT DETECTED Final   Haemophilus influenzae NOT DETECTED NOT DETECTED  Final   Neisseria meningitidis NOT DETECTED NOT DETECTED Final   Pseudomonas aeruginosa NOT DETECTED NOT DETECTED Final   Candida albicans NOT DETECTED NOT DETECTED Final   Candida glabrata NOT DETECTED NOT DETECTED Final   Candida krusei NOT DETECTED NOT DETECTED Final    Candida parapsilosis NOT DETECTED NOT DETECTED Final   Candida tropicalis NOT DETECTED NOT DETECTED Final  Blood Culture (routine x 2)     Status: Abnormal (Preliminary result)   Collection Time: 09/17/16 10:00 AM  Result Value Ref Range Status   Specimen Description BLOOD BLOOD LEFT FOREARM  Final   Special Requests BOTTLES DRAWN AEROBIC AND ANAEROBIC 5CC  Final   Culture  Setup Time   Final    GRAM NEGATIVE RODS ANAEROBIC BOTTLE ONLY CRITICAL VALUE NOTED.  VALUE IS CONSISTENT WITH PREVIOUSLY REPORTED AND CALLED VALUE.    Culture (A)  Final    ESCHERICHIA COLI SUSCEPTIBILITIES PERFORMED ON PREVIOUS CULTURE WITHIN THE LAST 5 DAYS. CULTURE REINCUBATED FOR BETTER GROWTH    Report Status PENDING  Incomplete  Urine culture     Status: Abnormal   Collection Time: 09/17/16 11:00 AM  Result Value Ref Range Status   Specimen Description URINE, CATHETERIZED  Final   Special Requests NONE  Final   Culture >=100,000 COLONIES/mL ESCHERICHIA COLI (A)  Final   Report Status 09/19/2016 FINAL  Final   Organism ID, Bacteria ESCHERICHIA COLI (A)  Final      Susceptibility   Escherichia coli - MIC*    AMPICILLIN >=32 RESISTANT Resistant     CEFAZOLIN <=4 SENSITIVE Sensitive     CEFTRIAXONE <=1 SENSITIVE Sensitive     CIPROFLOXACIN <=0.25 SENSITIVE Sensitive     GENTAMICIN <=1 SENSITIVE Sensitive     IMIPENEM <=0.25 SENSITIVE Sensitive     NITROFURANTOIN <=16 SENSITIVE Sensitive     TRIMETH/SULFA <=20 SENSITIVE Sensitive     AMPICILLIN/SULBACTAM 16 INTERMEDIATE Intermediate     PIP/TAZO <=4 SENSITIVE Sensitive     Extended ESBL NEGATIVE Sensitive     * >=100,000 COLONIES/mL ESCHERICHIA COLI  MRSA PCR Screening     Status: None   Collection Time: 09/17/16 11:24 PM  Result Value Ref Range Status   MRSA by PCR NEGATIVE NEGATIVE Final    Comment:        The GeneXpert MRSA Assay (FDA approved for NASAL specimens only), is one component of a comprehensive MRSA colonization surveillance  program. It is not intended to diagnose MRSA infection nor to guide or monitor treatment for MRSA infections.          Radiology Studies: No results found.      Scheduled Meds: . cefTRIAXone (ROCEPHIN)  IV  2 g Intravenous Q24H  . cyanocobalamin  1,000 mcg Subcutaneous Daily  . folic acid  1 mg Oral Daily  . heparin  5,000 Units Subcutaneous Q8H  . levothyroxine  300 mcg Oral QAC breakfast  . mouth rinse  15 mL Mouth Rinse BID  . metoprolol tartrate  12.5 mg Oral BID  . sodium chloride flush  3 mL Intravenous Q12H  . spironolactone  25 mg Oral Daily   Continuous Infusions: . dextrose 5 % with kcl 75 mL/hr at 09/21/16 0843     LOS: 4 days    Time spent: 35 minutes.     Calvert CantorIZWAN,Denisia Harpole, MD [facility] Pager: [email or web address] Password [email or web address] 09/21/2016, 4:08 PM

## 2016-09-21 NOTE — Evaluation (Signed)
Physical Therapy Evaluation Patient Details Name: Mark BaliJeffrey Meadows MRN: [id number] DOB: 02-21-1951 Today's Date: 09/21/2016   History of Present Illness  Mark Meadows is a 65 y.o. male with medical history significant of EtOH abuse, alcohol induced hepatitis, atrial fibrillation, GERD, hypertension, hypokalemia, thrombocytopenia, hypothyroidism, gout, presenting from [facility] acutely encephalopathic; sepsis due to bacteremia  Clinical Impression   Pt admitted with above diagnosis. Pt currently with functional limitations due to the deficits listed below (see PT Problem List). Admitted from SNF, and appropriate for going back to SNF for post-acute rehab; I anticipate improved mobility as gout is more under control; Noted Palliative Care Team involvement -- is [facility] a good option? Will follow their lead; Pt will benefit from skilled PT to increase their independence and safety with mobility to allow discharge to the venue listed below.       Follow Up Recommendations SNF;Supervision/Assistance - 24 hour (Possible [facility]idential [facility]? Will follow Palliative's lead)    Equipment Recommendations  Other (comment) (To be determined)    Recommendations for Other Services       Precautions / Restrictions Precautions Precautions: Fall Precaution Comments: info obtained from recent admission 08/28/16 and 09/04/16 chart states pt  started falling in Oct 2016 and broke ribs.  In March 2017, fell 4 x and broke ribs again.  Has had other falls since then not resulting in injuries.  Restrictions Weight Bearing Restrictions: No      Mobility  Bed Mobility Overal bed mobility: Needs Assistance Bed Mobility: Rolling;Sidelying to Sit;Sit to Sidelying Rolling: Max assist Sidelying to sit: Max assist     Sit to sidelying: Max assist General bed mobility comments: Cues for technique; increased time, but pt able to initiate reaching for rail and pulling, needed assist with bed  pad to shift hips fully as well; Max assist to clear feet from bed and elevate trunk to sit; max assit for positioning back supine  Transfers Overall transfer level: Needs assistance Equipment used:  (bed pad) Transfers: Sit to/from Stand;Lateral/Scoot Transfers Sit to Stand: Max assist        Lateral/Scoot Transfers: Max assist General transfer comment: Attempts at sit to stand transfers using bed pad to cradle hips and knee block unsuccessful due to L knee pain; then used bed pad to assist in simulated lateral scoot transfer at EOB towards EOB; noting good weight shift to initiatie scooting, but difficuly unweighing hips  Ambulation/Gait                Stairs            Wheelchair Mobility    Modified Rankin (Stroke Patients Only)       Balance Overall balance assessment: Needs assistance;History of Falls Sitting-balance support: Bilateral upper extremity supported;Feet supported Sitting balance-Mark Meadows Scale: Poor (approaching Fair) Sitting balance - Comments: Sat EOB approx 5 min; at times with minguard assist, but tending to post lean Postural control: Posterior lean   Standing balance-Mark Meadows Scale: Zero                               Pertinent Vitals/Pain Pain Assessment: Faces Faces Pain Scale: Hurts even more Pain Location: L knee with attempts at weight bearing Pain Descriptors / Indicators: Grimacing Pain Intervention(s): Monitored during session;Repositioned    Home Living Family/patient expects to be discharged to:: Skilled nursing facility Living Arrangements: Alone   Type of Home: House Home Access: Stairs to enter Entrance Stairs-Rails: Right;Left;Can  reach both Entrance Stairs-Number of Steps: 4 Home Layout: One level Home Equipment: Cane - single point;Grab bars - tub/shower      Prior Function Level of Independence: Needs assistance   Gait / Transfers Assistance Needed: Pt did not indicate that he had been walking at SNF  since last admission 12/14  ADL's / Homemaking Assistance Needed: Likely needed assist  Comments: [facility]16 [facility], during [facility] he ambulated 300' with RW on 08/26/16. Pt stated he hasn't walked in 4 days, was on floor for 3 days PTA.      Hand Dominance        Extremity/Trunk Assessment   Upper Extremity Assessment Upper Extremity Assessment: Generalized weakness     Lower Extremity Assessment Lower Extremity Assessment: Generalized weakness;LLE deficits/detail RLE Deficits / Details: tolerated AAROM L knee 10-90*, hip flexion to 90*, hip ABD to 20*, grossly 3/5 LLE Deficits / Details: L knee pain with movement, sensation intact to L foot LLE: Unable to fully assess due to pain       Communication   Communication: Expressive difficulties (difficutl to understand)  Cognition Arousal/Alertness: Awake/alert Behavior During Therapy: WFL for tasks assessed/performed Overall Cognitive Status: No family/caregiver present to determine baseline cognitive functioning Area of Impairment: Memory;Following commands     Memory: Decreased short-term memory Following Commands: Follows one step commands with increased time       General Comments: pt oriented to year, self, location. Could not provide recent functional history, stated he didn't remember.     General Comments      Exercises     Assessment/Plan    PT Assessment Patient needs continued PT services  PT Problem List Decreased strength;Decreased range of motion;Decreased activity tolerance;Decreased mobility;Pain;Decreased cognition          PT Treatment Interventions DME instruction;Gait training;Functional mobility training;Therapeutic activities;Therapeutic exercise;Balance training;Patient/family education    PT Goals (Current goals can be found in the Care Plan section)  Acute Rehab PT Goals Patient Stated Goal: agrees to SNF; tried hard to tell me his  preference, but difficult to understand -- I made out "Bow" PT Goal Formulation: With patient Time For Goal Achievement: 10/05/16 Potential to Achieve Goals: Fair    Frequency Min 2X/week   Barriers to discharge        Co-evaluation               End of Session Equipment Utilized During Treatment: Gait belt (bed pad) Activity Tolerance: Patient limited by pain Patient left: in bed;with bed alarm set (bed in semi-chair position) Nurse Communication: Mobility status;Need for lift equipment (incontinent of urine)         Time: [id number] PT Time Calculation (min) (ACUTE ONLY): 23 min   Charges:   PT Evaluation $PT Eval Moderate Complexity: 1 Procedure PT Treatments $Therapeutic Activity: 8-22 mins   PT G Codes:        Levi AlandHolly H Vincient Vanaman 09/21/2016, 9:21 AM  Van ClinesHolly Anis Degidio, PT  Acute Rehabilitation Services Pager [phone] Office [phone]

## 2016-09-22 DIAGNOSIS — N390 Urinary tract infection, site not specified: Secondary | ICD-10-CM

## 2016-09-22 DIAGNOSIS — R7881 Bacteremia: Secondary | ICD-10-CM

## 2016-09-22 DIAGNOSIS — B9620 Unspecified Escherichia coli [E. coli] as the cause of diseases classified elsewhere: Secondary | ICD-10-CM

## 2016-09-22 HISTORY — DX: Urinary tract infection, site not specified: N39.0

## 2016-09-22 HISTORY — DX: Urinary tract infection, site not specified: B96.20

## 2016-09-22 LAB — CULTURE, BLOOD (ROUTINE X 2)

## 2016-09-22 LAB — BASIC METABOLIC PANEL
Anion gap: 8 (ref 5–15)
BUN: 9 mg/dL (ref 6–20)
CO2: 28 mmol/L (ref 22–32)
CREATININE: 0.7 mg/dL (ref 0.61–1.24)
Calcium: 8.5 mg/dL — ABNORMAL LOW (ref 8.9–10.3)
Chloride: 104 mmol/L (ref 101–111)
GFR calc Af Amer: >60 mL/min (ref >60)
Glucose, Bld: 100 mg/dL — ABNORMAL HIGH (ref 65–99)
POTASSIUM: 3.5 mmol/L (ref 3.5–5.1)
SODIUM: 140 mmol/L (ref 135–145)

## 2016-09-22 LAB — CBC
HEMATOCRIT: 31 % — AB (ref 39.0–52.0)
Hemoglobin: 10.1 g/dL — ABNORMAL LOW (ref 13.0–17.0)
MCH: 35.8 pg — ABNORMAL HIGH (ref 26.0–34.0)
MCHC: 32.6 g/dL (ref 30.0–36.0)
MCV: 109.9 fL — ABNORMAL HIGH (ref 78.0–100.0)
PLATELETS: 176 10*3/uL (ref 150–400)
RBC: 2.82 MIL/uL — ABNORMAL LOW (ref 4.22–5.81)
RDW: 14.9 % (ref 11.5–15.5)
WBC: 13.3 10*3/uL — AB (ref 4.0–10.5)

## 2016-09-22 MED ORDER — CEPHALEXIN 500 MG PO CAPS: 1000.0000 mg | ORAL_CAPSULE | Freq: Two times a day (BID) | ORAL | Status: DC

## 2016-09-22 NOTE — Discharge Summary (Addendum)
Physician Discharge Summary  Mark Meadows ZOX:[id number] DOB: 1951/07/15 DOA: 09/17/2016  PCP: Mark Palau, FNP  Admit date: 09/17/2016   Admitted From: SNF  Disposition:  SNF   Recommendations for Outpatient Follow-up:  1. F/u anemia, B12 and Folic acid levels 2. Per palliative care notes on 12/29 >> Mark Meadows - HCPOA and Mark Meadows - back up HCPOA- patient is DNR 3. Resuming Aldactone- f/u for dehydration- f/u Bmet  4.  last discharged from the hospital to SNF on 12/14 on Flomax for urinary retention- his med list states that he is currently not receiving this (discontinued at SNF for unknown reasons)I have resumed it to prevent further UTIs.   Discharge Condition:  stable   CODE STATUS:  DNR   Diet recommendation:  Low sodium, heart healthy Consultations:  none    Discharge Diagnoses:  Principal Problem:   Severe sepsis (HCC) Active Problems:   Bacteremia   E. coli UTI   Hypertension   Alcoholism /alcohol abuse (HCC)   Hypokalemia   Elevated troponin   Acute encephalopathy   Palliative care encounter   Goals of care, counseling/discussion    Subjective: No complaints- remains confused.   Brief Summary: Mark Meadows a 66 y.o.malewith medical history significant of EtOH abuse, alcohol induced hepatitis/ cirrhosis, dementia GERD, hypertension, hypokalemia, thrombocytopenia, hypothyroidism & gout presenting from [facility] nursing home for acutely encephalopathic. Noted in ER to have fever of 101.9.  Hospital Course:  Severe Sepsis due to UTI and E coli bacteremia  - fever, leukocytosis, tachycardia lactic acisosis Cefepime started on 12/27 Changed cefepime to ceftriaxone based on sensitivities and then to Keflex - needs total 2 wk course Lactic acid trended down form 5 to 2.5 to 1.2  Stress dose steroids given temporarily  NOTE: last discharged from the hospital to SNF on 12/14 on Flomax for urinary retention- he is currently not receiving this as it  must have been discontinued at the SNF. I have resumed it to prevent further UTIs.   Acute Hypoxic respiratory failure;   - ? Due to sepsis - Oxygen on RA now 95 % .   HTN - on Norvasc, metoprolol and Aldactone at SNF   AKI - likely due to sepsis- renal function improved to normal - aldactone temporarily held  Acute encephalopathy/ dementia  - apparently was sent from facility due to a change in mental status.  - uncertain of baseline but he has been oriented to person only since I have been seeing him for the past few days- prior notes also state the same  Hypernatremia and hypokalemia - improved with D5W with K and follow   Anemia- folic acid and B12 deficiency - anemia panel shows folate and B12 deficiency (due to prior ETOH abuse?) -  started replacement of folic acid - cont B12 as he was receiving at facility - f/u levels in 2-3 months   Cirrhosis     Hypothyroidism - admitted for severe hypothyroidism on 09/04/16 with TSH of 32 -  continue with synthroid- f/u Thyroid functions in 2 wks  Elevated troponin -  Mild - in setting of sepsis.      Discharge Instructions  Discharge Instructions    Diet - low sodium heart healthy    Complete by:  As directed    Increase activity slowly    Complete by:  As directed      Allergies as of 09/23/2016   No Known Allergies     Medication List    STOP taking these  medications   chlordiazePOXIDE 10 MG capsule Commonly known as:  LIBRIUM     TAKE these medications   amLODipine 10 MG tablet Commonly known as:  NORVASC Take 1 tablet (10 mg total) by mouth daily.   cephALEXin 500 MG capsule Commonly known as:  KEFLEX Take 2 capsules (1,000 mg total) by mouth every 12 (twelve) hours.   folic acid 1 MG tablet Commonly known as:  FOLVITE Take 1 tablet (1 mg total) by mouth daily.   hydrocortisone 25 MG suppository Commonly known as:  ANUSOL-HC Place 25 mg rectally 2 (two) times daily as needed for  hemorrhoids or itching.   ibuprofen 200 MG tablet Commonly known as:  ADVIL,MOTRIN Take 400 mg by mouth every 6 (six) hours as needed (pain).   levothyroxine 300 MCG tablet Commonly known as:  SYNTHROID, LEVOTHROID Take 1 tablet (300 mcg total) by mouth daily before breakfast.   metoprolol tartrate 25 MG tablet Commonly known as:  LOPRESSOR Take 0.5 tablets (12.5 mg total) by mouth 2 (two) times daily.   pantoprazole 40 MG tablet Commonly known as:  PROTONIX Take 1 tablet (40 mg total) by mouth 2 (two) times daily.   spironolactone 25 MG tablet Commonly known as:  ALDACTONE Take 25 mg by mouth daily.   tamsulosin 0.4 MG Caps capsule Commonly known as:  FLOMAX Take 1 capsule (0.4 mg total) by mouth daily after supper.   thiamine 100 MG tablet Take 1 tablet (100 mg total) by mouth daily.   vitamin B-12 1000 MCG tablet Commonly known as:  CYANOCOBALAMIN Take 1,000 mcg by mouth once a week.   cyanocobalamin 1000 MCG/ML injection Commonly known as:  (VITAMIN B-12) Inject 1,000 mcg into the muscle every Wednesday. Stop date 10/01/16       No Known Allergies   Procedures/Studies:  Dg Chest 2 View  Result Date: 09/04/2016 CLINICAL DATA:  Initial evaluation for acute shortness of breath. EXAM: CHEST  2 VIEW COMPARISON:  Prior radiograph from 09/02/2016. FINDINGS: Cardiomegaly, stable from prior. Mediastinal silhouette within normal limits. Lungs mildly hypoinflated. Patchy density within the retrocardiac left lower lobe most compatible with atelectasis. Mild diffuse vascular congestion with increasing interstitial prominence, suggesting mild interstitial edema. No pleural effusion. No other focal infiltrates. No pneumothorax. Remotely healed left-sided rib fractures noted. No acute osseous abnormality. IMPRESSION: 1. Cardiomegaly with mild diffuse vascular congestion and interstitial prominence, suggesting mild pulmonary interstitial edema. 2. Patchy left basilar opacity, likely  atelectasis, similar to previous. 3. Remotely healed left-sided rib fractures. Electronically Signed   By: Rise MuBenjamin  McClintock M.D.   On: 09/04/2016 21:08   Ct Head Wo Contrast  Result Date: 09/17/2016 CLINICAL DATA:  Fall yesterday. Change in mental status. Initial encounter. EXAM: CT HEAD WITHOUT CONTRAST TECHNIQUE: Contiguous axial images were obtained from the base of the skull through the vertex without intravenous contrast. COMPARISON:  None. FINDINGS: Brain: No evidence of acute infarction, hemorrhage, hydrocephalus, extra-axial collection or mass lesion/mass effect. Age advanced atrophy and white matter disease attributed to chronic microvascular ischemia. Vascular: No hyperdense vessel. Prominent posterior circulation atherosclerotic plaque calcification. Skull: No acute or posttraumatic finding. Sinuses/Orbits: Dysconjugate gaze, nonspecific. IMPRESSION: 1. No acute finding. 2. Age advanced atrophy and chronic microvascular disease. Electronically Signed   By: Marnee SpringJonathon  Watts M.D.   On: 09/17/2016 10:57   Dg Chest Port 1 View  Result Date: 09/18/2016 CLINICAL DATA:  66 year old male admitted yesterday with sepsis and encephalopathy. Hypoxia. Initial encounter. EXAM: PORTABLE CHEST 1 VIEW COMPARISON:  09/17/2016 and  earlier. FINDINGS: Portable AP upright view at 0938 hours. Mildly displaced fracture of the right lateral fifth or sixth rib (arrow) probably was present on the earliest comparison dated 09/02/2016. Subacute to chronic left lateral seventh rib fracture with callus. Calcified aortic atherosclerosis. Stable cardiomegaly. Hiatal hernia suspected. Stable mediastinal contours. Patchy bibasilar pulmonary opacity. No pneumothorax or pleural effusion. Stable pulmonary vascularity without overt edema. IMPRESSION: 1. Patchy bilateral lung base opacity could reflect atelectasis or infection. Vascular congestion without overt edema. No pleural effusion evident. 2. Hiatal hernia suspected.  Cardiomegaly. Calcified aortic atherosclerosis. 3. Subacute and/or chronic bilateral lateral rib fractures. Electronically Signed   By: Odessa Fleming M.D.   On: 09/18/2016 09:55   Dg Chest Port 1 View  Result Date: 09/17/2016 CLINICAL DATA:  Fever. EXAM: PORTABLE CHEST 1 VIEW COMPARISON:  09/04/2016 . FINDINGS: Cardiomegaly. Mild diffuse bilateral pulmonary interstitial prominence with small left pleural effusion. Mild CHF cannot be excluded. No pneumothorax. IMPRESSION: Cardiomegaly with mild diffuse bilateral from interstitial prominence of small left pleural effusion. Mild CHF cannot be excluded . Electronically Signed   By: Maisie Fus  Register   On: 09/17/2016 10:22   Dg Chest Portable 1 View  Result Date: 09/02/2016 CLINICAL DATA:  Weakness. EXAM: PORTABLE CHEST 1 VIEW COMPARISON:  None. FINDINGS: The heart is mildly enlarged. There is atelectasis at the left lung base. There is no evidence of pulmonary edema, consolidation, pneumothorax, nodule or pleural fluid. Some irregularity of left lower lateral ribs likely reflects old fractures. IMPRESSION: Left basilar atelectasis and mild cardiomegaly. Old left-sided rib fractures. Electronically Signed   By: Irish Lack M.D.   On: 09/02/2016 16:30   Dg Knee Complete 4 Views Left  Result Date: 09/02/2016 CLINICAL DATA:  Acute left knee pain. EXAM: LEFT KNEE - COMPLETE 4+ VIEW COMPARISON:  None. FINDINGS: No evidence of fracture or dislocation. Moderate suprapatellar joint effusion is noted. No evidence of arthropathy or other focal bone abnormality. Soft tissues are unremarkable. IMPRESSION: Moderate size suprapatellar joint effusion. No fracture or dislocation is noted. Electronically Signed   By: Lupita Raider, M.D.   On: 09/02/2016 19:10   Dg Hand Complete Right  Result Date: 09/17/2016 CLINICAL DATA:  Swelling, pain and redness in index finger. No known injury. EXAM: RIGHT HAND - COMPLETE 3+ VIEW COMPARISON:  None. FINDINGS: There is no evidence  of fracture or dislocation. There is no evidence of arthropathy or other focal bone abnormality. Soft tissues are unremarkable. IMPRESSION: Negative. Electronically Signed   By: Charlett Nose M.D.   On: 09/17/2016 18:19       Discharge Exam: Vitals:   09/22/16 2137 09/23/16 0409  BP: (!) 146/91 (!) 134/92  Pulse: 63 63  Resp: 18 17  Temp: 97.6 F (36.4 C) 97.7 F (36.5 C)   Vitals:   09/22/16 1451 09/22/16 2137 09/23/16 0409 09/23/16 0423  BP: 130/90 (!) 146/91 (!) 134/92   Pulse: 67 63 63   Resp: 18 18 17    Temp: 98 F (36.7 C) 97.6 F (36.4 C) 97.7 F (36.5 C)   TempSrc: Oral Oral Oral   SpO2: 100% 100% 95%   Weight:    92.9 kg (204 lb 11.2 oz)  Height:        General: Pt is alert, awake, confused to time and place, not in acute distress Cardiovascular: RRR, S1/S2 +, no rubs, no gallops Respiratory: CTA bilaterally, no wheezing, no rhonchi Abdominal: Soft, NT, ND, bowel sounds + Extremities: no edema, no cyanosis  The results of significant diagnostics from this hospitalization (including imaging, microbiology, ancillary and laboratory) are listed below for reference.     Microbiology: Recent Results (from the past 240 hour(s))  Blood Culture (routine x 2)     Status: Abnormal   Collection Time: 09/17/16  9:49 AM  Result Value Ref Range Status   Specimen Description BLOOD BLOOD LEFT FOREARM  Final   Special Requests BOTTLES DRAWN AEROBIC AND ANAEROBIC 5CC  Final   Culture  Setup Time   Final    GRAM NEGATIVE RODS IN BOTH AEROBIC AND ANAEROBIC BOTTLES CRITICAL RESULT CALLED TO, READ BACK BY AND VERIFIED WITH: GREG ABBOTT,PHARMD @0122  09/18/16 MKELLY,MLT    Culture ESCHERICHIA COLI (A)  Final   Report Status 09/22/2016 FINAL  Final   Organism ID, Bacteria ESCHERICHIA COLI  Final      Susceptibility   Escherichia coli - MIC*    AMPICILLIN >=32 RESISTANT Resistant     CEFAZOLIN <=4 SENSITIVE Sensitive     CEFEPIME <=1 SENSITIVE Sensitive     CEFTAZIDIME <=1  SENSITIVE Sensitive     CEFTRIAXONE <=1 SENSITIVE Sensitive     CIPROFLOXACIN <=0.25 SENSITIVE Sensitive     GENTAMICIN <=1 SENSITIVE Sensitive     IMIPENEM <=0.25 SENSITIVE Sensitive     TRIMETH/SULFA <=20 SENSITIVE Sensitive     AMPICILLIN/SULBACTAM 16 INTERMEDIATE Intermediate     PIP/TAZO <=4 SENSITIVE Sensitive     Extended ESBL NEGATIVE Sensitive     * ESCHERICHIA COLI  Blood Culture ID Panel (Reflexed)     Status: Abnormal   Collection Time: 09/17/16  9:49 AM  Result Value Ref Range Status   Enterococcus species NOT DETECTED NOT DETECTED Final   Listeria monocytogenes NOT DETECTED NOT DETECTED Final   Staphylococcus species NOT DETECTED NOT DETECTED Final   Staphylococcus aureus NOT DETECTED NOT DETECTED Final   Streptococcus species NOT DETECTED NOT DETECTED Final   Streptococcus agalactiae NOT DETECTED NOT DETECTED Final   Streptococcus pneumoniae NOT DETECTED NOT DETECTED Final   Streptococcus pyogenes NOT DETECTED NOT DETECTED Final   Acinetobacter baumannii NOT DETECTED NOT DETECTED Final   Enterobacteriaceae species DETECTED (A) NOT DETECTED Final    Comment: CRITICAL RESULT CALLED TO, READ BACK BY AND VERIFIED WITH: GREG ABBOTT,PHARMD @0122  09/18/16 MKELLY,MLT    Enterobacter cloacae complex NOT DETECTED NOT DETECTED Final   Escherichia coli DETECTED (A) NOT DETECTED Final    Comment: CRITICAL RESULT CALLED TO, READ BACK BY AND VERIFIED WITH: GREG ABBOTT,PHARMD @0122  09/18/16 MKELLY,MLT    Klebsiella oxytoca NOT DETECTED NOT DETECTED Final   Klebsiella pneumoniae NOT DETECTED NOT DETECTED Final   Proteus species NOT DETECTED NOT DETECTED Final   Serratia marcescens NOT DETECTED NOT DETECTED Final   Carbapenem resistance NOT DETECTED NOT DETECTED Final   Haemophilus influenzae NOT DETECTED NOT DETECTED Final   Neisseria meningitidis NOT DETECTED NOT DETECTED Final   Pseudomonas aeruginosa NOT DETECTED NOT DETECTED Final   Candida albicans NOT DETECTED NOT DETECTED  Final   Candida glabrata NOT DETECTED NOT DETECTED Final   Candida krusei NOT DETECTED NOT DETECTED Final   Candida parapsilosis NOT DETECTED NOT DETECTED Final   Candida tropicalis NOT DETECTED NOT DETECTED Final  Blood Culture (routine x 2)     Status: Abnormal   Collection Time: 09/17/16 10:00 AM  Result Value Ref Range Status   Specimen Description BLOOD BLOOD LEFT FOREARM  Final   Special Requests BOTTLES DRAWN AEROBIC AND ANAEROBIC 5CC  Final   Culture  Setup Time   Final    GRAM NEGATIVE RODS ANAEROBIC BOTTLE ONLY CRITICAL VALUE NOTED.  VALUE IS CONSISTENT WITH PREVIOUSLY REPORTED AND CALLED VALUE.    Culture (A)  Final    ESCHERICHIA COLI SUSCEPTIBILITIES PERFORMED ON PREVIOUS CULTURE WITHIN THE LAST 5 DAYS.    Report Status 09/22/2016 FINAL  Final  Urine culture     Status: Abnormal   Collection Time: 09/17/16 11:00 AM  Result Value Ref Range Status   Specimen Description URINE, CATHETERIZED  Final   Special Requests NONE  Final   Culture >=100,000 COLONIES/mL ESCHERICHIA COLI (A)  Final   Report Status 09/19/2016 FINAL  Final   Organism ID, Bacteria ESCHERICHIA COLI (A)  Final      Susceptibility   Escherichia coli - MIC*    AMPICILLIN >=32 RESISTANT Resistant     CEFAZOLIN <=4 SENSITIVE Sensitive     CEFTRIAXONE <=1 SENSITIVE Sensitive     CIPROFLOXACIN <=0.25 SENSITIVE Sensitive     GENTAMICIN <=1 SENSITIVE Sensitive     IMIPENEM <=0.25 SENSITIVE Sensitive     NITROFURANTOIN <=16 SENSITIVE Sensitive     TRIMETH/SULFA <=20 SENSITIVE Sensitive     AMPICILLIN/SULBACTAM 16 INTERMEDIATE Intermediate     PIP/TAZO <=4 SENSITIVE Sensitive     Extended ESBL NEGATIVE Sensitive     * >=100,000 COLONIES/mL ESCHERICHIA COLI  MRSA PCR Screening     Status: None   Collection Time: 09/17/16 11:24 PM  Result Value Ref Range Status   MRSA by PCR NEGATIVE NEGATIVE Final    Comment:        The GeneXpert MRSA Assay (FDA approved for NASAL specimens only), is one component of  a comprehensive MRSA colonization surveillance program. It is not intended to diagnose MRSA infection nor to guide or monitor treatment for MRSA infections.      Labs: BNP (last 3 results)  Recent Labs  09/17/16 0954  BNP 363.5*   Basic Metabolic Panel:  Recent Labs Lab 09/19/16 0804 09/20/16 0655 09/21/16 0603 09/22/16 1156 09/23/16 0427  NA 144 146* 147* 140 137  K 3.6 3.2* 3.3* 3.5 3.5  CL 115* 116* 115* 104 104  CO2 21* 22 24 28 25   GLUCOSE 164* 117* 86 100* 94  BUN 23* 26* 19 9 9   CREATININE 0.99 0.87 0.83 0.70 0.73  CALCIUM 8.3* 8.5* 8.7* 8.5* 8.5*   Liver Function Tests:  Recent Labs Lab 09/17/16 0749 09/18/16 0221  AST 115* 86*  ALT 41 39  ALKPHOS 329* 176*  BILITOT 1.4* 1.0  PROT 6.7 6.0*  ALBUMIN 2.3* 1.9*    Recent Labs Lab 09/17/16 0749  LIPASE 18    Recent Labs Lab 09/18/16 0849  AMMONIA 14   CBC:  Recent Labs Lab 09/17/16 0749 09/18/16 0221 09/19/16 0804 09/20/16 0655 09/21/16 0603 09/22/16 1156  WBC 13.6* 28.4* 22.8* 18.0* 14.1* 13.3*  NEUTROABS 13.2*  --   --   --   --   --   HGB 9.3* 7.6* 7.8* 7.9* 8.4* 10.1*  HCT 28.5* 24.2* 24.8* 25.5* 27.0* 31.0*  MCV 111.3* 114.7* 115.3* 113.8* 113.9* 109.9*  PLT 240 167 150 150 157 176   Cardiac Enzymes:  Recent Labs Lab 09/17/16 0749 09/17/16 1536  TROPONINI 0.06* 0.06*   BNP: Invalid input(s): POCBNP CBG: No results for input(s): GLUCAP in the last 168 hours. D-Dimer No results for input(s): DDIMER in the last 72 hours. Hgb A1c No results for input(s): HGBA1C in the last 72 hours. Lipid Profile No  results for input(s): CHOL, HDL, LDLCALC, TRIG, CHOLHDL, LDLDIRECT in the last 72 hours. Thyroid function studies No results for input(s): TSH, T4TOTAL, T3FREE, THYROIDAB in the last 72 hours.  Invalid input(s): FREET3 Anemia work up No results for input(s): VITAMINB12, FOLATE, FERRITIN, TIBC, IRON, RETICCTPCT in the last 72 hours. Urinalysis    Component Value  Date/Time   COLORURINE AMBER (A) 09/17/2016 1115   APPEARANCEUR TURBID (A) 09/17/2016 1115   LABSPEC 1.018 09/17/2016 1115   PHURINE 5.0 09/17/2016 1115   GLUCOSEU NEGATIVE 09/17/2016 1115   HGBUR SMALL (A) 09/17/2016 1115   BILIRUBINUR SMALL (A) 09/17/2016 1115   KETONESUR NEGATIVE 09/17/2016 1115   PROTEINUR 100 (A) 09/17/2016 1115   NITRITE NEGATIVE 09/17/2016 1115   LEUKOCYTESUR MODERATE (A) 09/17/2016 1115   Sepsis Labs Invalid input(s): PROCALCITONIN,  WBC,  LACTICIDVEN Microbiology Recent Results (from the past 240 hour(s))  Blood Culture (routine x 2)     Status: Abnormal   Collection Time: 09/17/16  9:49 AM  Result Value Ref Range Status   Specimen Description BLOOD BLOOD LEFT FOREARM  Final   Special Requests BOTTLES DRAWN AEROBIC AND ANAEROBIC 5CC  Final   Culture  Setup Time   Final    GRAM NEGATIVE RODS IN BOTH AEROBIC AND ANAEROBIC BOTTLES CRITICAL RESULT CALLED TO, READ BACK BY AND VERIFIED WITH: GREG ABBOTT,PHARMD @0122  09/18/16 MKELLY,MLT    Culture ESCHERICHIA COLI (A)  Final   Report Status 09/22/2016 FINAL  Final   Organism ID, Bacteria ESCHERICHIA COLI  Final      Susceptibility   Escherichia coli - MIC*    AMPICILLIN >=32 RESISTANT Resistant     CEFAZOLIN <=4 SENSITIVE Sensitive     CEFEPIME <=1 SENSITIVE Sensitive     CEFTAZIDIME <=1 SENSITIVE Sensitive     CEFTRIAXONE <=1 SENSITIVE Sensitive     CIPROFLOXACIN <=0.25 SENSITIVE Sensitive     GENTAMICIN <=1 SENSITIVE Sensitive     IMIPENEM <=0.25 SENSITIVE Sensitive     TRIMETH/SULFA <=20 SENSITIVE Sensitive     AMPICILLIN/SULBACTAM 16 INTERMEDIATE Intermediate     PIP/TAZO <=4 SENSITIVE Sensitive     Extended ESBL NEGATIVE Sensitive     * ESCHERICHIA COLI  Blood Culture ID Panel (Reflexed)     Status: Abnormal   Collection Time: 09/17/16  9:49 AM  Result Value Ref Range Status   Enterococcus species NOT DETECTED NOT DETECTED Final   Listeria monocytogenes NOT DETECTED NOT DETECTED Final    Staphylococcus species NOT DETECTED NOT DETECTED Final   Staphylococcus aureus NOT DETECTED NOT DETECTED Final   Streptococcus species NOT DETECTED NOT DETECTED Final   Streptococcus agalactiae NOT DETECTED NOT DETECTED Final   Streptococcus pneumoniae NOT DETECTED NOT DETECTED Final   Streptococcus pyogenes NOT DETECTED NOT DETECTED Final   Acinetobacter baumannii NOT DETECTED NOT DETECTED Final   Enterobacteriaceae species DETECTED (A) NOT DETECTED Final    Comment: CRITICAL RESULT CALLED TO, READ BACK BY AND VERIFIED WITH: GREG ABBOTT,PHARMD @0122  09/18/16 MKELLY,MLT    Enterobacter cloacae complex NOT DETECTED NOT DETECTED Final   Escherichia coli DETECTED (A) NOT DETECTED Final    Comment: CRITICAL RESULT CALLED TO, READ BACK BY AND VERIFIED WITH: GREG ABBOTT,PHARMD @0122  09/18/16 MKELLY,MLT    Klebsiella oxytoca NOT DETECTED NOT DETECTED Final   Klebsiella pneumoniae NOT DETECTED NOT DETECTED Final   Proteus species NOT DETECTED NOT DETECTED Final   Serratia marcescens NOT DETECTED NOT DETECTED Final   Carbapenem resistance NOT DETECTED NOT DETECTED Final   Haemophilus influenzae  NOT DETECTED NOT DETECTED Final   Neisseria meningitidis NOT DETECTED NOT DETECTED Final   Pseudomonas aeruginosa NOT DETECTED NOT DETECTED Final   Candida albicans NOT DETECTED NOT DETECTED Final   Candida glabrata NOT DETECTED NOT DETECTED Final   Candida krusei NOT DETECTED NOT DETECTED Final   Candida parapsilosis NOT DETECTED NOT DETECTED Final   Candida tropicalis NOT DETECTED NOT DETECTED Final  Blood Culture (routine x 2)     Status: Abnormal   Collection Time: 09/17/16 10:00 AM  Result Value Ref Range Status   Specimen Description BLOOD BLOOD LEFT FOREARM  Final   Special Requests BOTTLES DRAWN AEROBIC AND ANAEROBIC 5CC  Final   Culture  Setup Time   Final    GRAM NEGATIVE RODS ANAEROBIC BOTTLE ONLY CRITICAL VALUE NOTED.  VALUE IS CONSISTENT WITH PREVIOUSLY REPORTED AND CALLED VALUE.     Culture (A)  Final    ESCHERICHIA COLI SUSCEPTIBILITIES PERFORMED ON PREVIOUS CULTURE WITHIN THE LAST 5 DAYS.    Report Status 09/22/2016 FINAL  Final  Urine culture     Status: Abnormal   Collection Time: 09/17/16 11:00 AM  Result Value Ref Range Status   Specimen Description URINE, CATHETERIZED  Final   Special Requests NONE  Final   Culture >=100,000 COLONIES/mL ESCHERICHIA COLI (A)  Final   Report Status 09/19/2016 FINAL  Final   Organism ID, Bacteria ESCHERICHIA COLI (A)  Final      Susceptibility   Escherichia coli - MIC*    AMPICILLIN >=32 RESISTANT Resistant     CEFAZOLIN <=4 SENSITIVE Sensitive     CEFTRIAXONE <=1 SENSITIVE Sensitive     CIPROFLOXACIN <=0.25 SENSITIVE Sensitive     GENTAMICIN <=1 SENSITIVE Sensitive     IMIPENEM <=0.25 SENSITIVE Sensitive     NITROFURANTOIN <=16 SENSITIVE Sensitive     TRIMETH/SULFA <=20 SENSITIVE Sensitive     AMPICILLIN/SULBACTAM 16 INTERMEDIATE Intermediate     PIP/TAZO <=4 SENSITIVE Sensitive     Extended ESBL NEGATIVE Sensitive     * >=100,000 COLONIES/mL ESCHERICHIA COLI  MRSA PCR Screening     Status: None   Collection Time: 09/17/16 11:24 PM  Result Value Ref Range Status   MRSA by PCR NEGATIVE NEGATIVE Final    Comment:        The GeneXpert MRSA Assay (FDA approved for NASAL specimens only), is one component of a comprehensive MRSA colonization surveillance program. It is not intended to diagnose MRSA infection nor to guide or monitor treatment for MRSA infections.      Time coordinating discharge: Over 30 minutes  SIGNED:   Calvert Cantor, MD  [facility] 09/23/2016, 9:10 AM Pager   If 7PM-7AM, please contact night-coverage [email or web address] Password [email or web address]

## 2016-09-23 DIAGNOSIS — R6520 Severe sepsis without septic shock: Secondary | ICD-10-CM

## 2016-09-23 DIAGNOSIS — A419 Sepsis, unspecified organism: Secondary | ICD-10-CM

## 2016-09-23 LAB — BASIC METABOLIC PANEL
Anion gap: 8 (ref 5–15)
BUN: 9 mg/dL (ref 6–20)
CHLORIDE: 104 mmol/L (ref 101–111)
CO2: 25 mmol/L (ref 22–32)
CREATININE: 0.73 mg/dL (ref 0.61–1.24)
Calcium: 8.5 mg/dL — ABNORMAL LOW (ref 8.9–10.3)
GFR calc Af Amer: >60 mL/min (ref >60)
GLUCOSE: 94 mg/dL (ref 65–99)
POTASSIUM: 3.5 mmol/L (ref 3.5–5.1)
SODIUM: 137 mmol/L (ref 135–145)

## 2016-09-23 MED ORDER — TAMSULOSIN HCL 0.4 MG PO CAPS: 0.4000 mg | ORAL_CAPSULE | Freq: Every day | ORAL | Status: DC

## 2016-09-23 NOTE — Progress Notes (Signed)
   09/23/16 1000  Clinical Encounter Type  Visited With Patient;Health care provider  Visit Type Follow-up;Psychological support  Referral From Palliative care team  Consult/Referral To Chaplain  Recommendations (No AD at this time, follow up with Fam)  Spiritual Encounters  Spiritual Needs Emotional  Stress Factors  Patient Stress Factors None identified  Advance Directives (For Healthcare)  Does Patient Have a Medical Advance Directive? Yes  Does patient want to make changes to medical advance directive? No - Patient declined    Chaplain followed up on [place] consult from palliative care. Pt was in bed uncovered, attempted to cover pt for dignity purposes. Pt stated he was going out of town and didn't need to do ad. Prayed with pt and pt cried for a few moments that he missed family. Followed up with nurse Okey Regalarol. At this time, not comfortable completing AD or notarizing ad that is in bedside table. Will close consult.

## 2016-09-23 NOTE — Progress Notes (Signed)
Physical Therapy Treatment Patient Details Name: Mark BaliJeffrey Scronce MRN: [id number] DOB: June 05, 1951 Today's Date: 09/23/2016    History of Present Illness Mark Meadows is a 66 y.o. male with medical history significant of EtOH abuse, alcohol induced hepatitis, atrial fibrillation, GERD, hypertension, hypokalemia, thrombocytopenia, hypothyroidism, gout, [facility] acutely encephalopathic; sepsis due to bacteremia    PT Comments    Tolerating more activity today, still painful L knee, but able to take enough weight on LEs to attempt sit <> stand and stand pivot transfers; Hopeful for good functional progress at SNF  Follow Up Recommendations  SNF;Supervision/Assistance - 24 hour     Equipment Recommendations  Other (comment) (To be determined)    Recommendations for Other Services       Precautions / Restrictions Precautions Precautions: Fall Precaution Comments: info obtained from recent admission 08/28/16 and 09/04/16 chart states pt  started falling in Oct 2016 and broke ribs.  In March 2017, fell 4 x and broke ribs again.  Has had other falls since then not resulting in injuries.  Restrictions Weight Bearing Restrictions: No    Mobility  Bed Mobility Overal bed mobility: Needs Assistance Bed Mobility: Supine to Sit     Supine to sit: Max assist;+2 for physical assistance     General bed mobility comments: Pt's body was positioned quite close to the side of the bed to begin with; needed +2 assist to clear feet and elevate trunk to sit, and to scoot hips back closer to center of bed for sitting stability  Transfers Overall transfer level: Needs assistance Equipment used: 1 person hand held assist;2 person hand held assist Transfers: Sit to/from [employer]Stand;Squat Pivot Transfers Sit to Stand: +2 safety/equipment;Max assist   Squat pivot transfers: +2 physical assistance;Max assist     General transfer comment: Able to stand with heavy max assist and R  knee blocked with bilateral support at gait belt; +2 to squat pivot transfer to recliner with drop arm down; noting good weight shift and initiation lean to unweigh hips  Ambulation/Gait                 Stairs            Wheelchair Mobility    Modified Rankin (Stroke Patients Only)       Balance     Sitting balance-Leahy Scale: Fair       Standing balance-Leahy Scale: Zero                      Cognition Arousal/Alertness: Awake/alert Behavior During Therapy: WFL for tasks assessed/performed Overall Cognitive Status: No family/caregiver present to determine baseline cognitive functioning                 General Comments: Oriented to self, location and situation (with questioning cues); at one point, he was attempting to describe a "bone situation" he has by spelling it out (spelled the word "poke"); when I asked if it was gout, he said yes.    Exercises      General Comments        Pertinent Vitals/Pain Pain Assessment: Faces Faces Pain Scale: Hurts little more Pain Location: L knee gout pain Pain Descriptors / Indicators: Grimacing Pain Intervention(s): Monitored during session    Home Living                      Prior Function            PT Goals (  current goals can now be found in the care plan section) Acute Rehab PT Goals Patient Stated Goal: Seemed glad to be OOB PT Goal Formulation: With patient Time For Goal Achievement: 10/05/16 Potential to Achieve Goals: Fair Progress towards PT goals: Progressing toward goals    Frequency    Min 2X/week      PT Plan Current plan remains appropriate    Co-evaluation             End of Session Equipment Utilized During Treatment: Gait belt (bed pad) Activity Tolerance: Patient tolerated treatment well Patient left: in chair;with call bell/phone within reach;with chair alarm set     Time: 1610-9604 PT Time Calculation (min) (ACUTE ONLY): 23 min  Charges:   $Therapeutic Activity: 23-37 mins                    G Codes:      Levi Aland 10/22/16, 1:29 PM  Van Clines, PT  Acute Rehabilitation Services Pager [phone] Office [phone]

## 2016-09-23 NOTE — Clinical Social Work Placement (Signed)
   CLINICAL SOCIAL WORK PLACEMENT  NOTE  Date:  09/23/2016  Patient Details  Name: Mark BaliJeffrey Hellickson MRN: [id number] Date of Birth: [birth year]  Clinical Social Work is seeking post-discharge placement for this patient at the Skilled  Nursing Facility level of care (*CSW will initial, date and re-position this form in  chart as items are completed):  Yes   Patient/family provided with [facility] Clinical Social Work Department's list of facilities offering this level of care within the geographic area requested by the patient (or if unable, by the patient's family).  Yes   Patient/family informed of their freedom to choose among providers that offer the needed level of care, that participate in Medicare, Medicaid or managed care program needed by the patient, have an available bed and are willing to accept the patient.  Yes   Patient/family informed of [facility]'s ownership interest in [facility] and [facility], as well as of the fact that they are under no obligation to receive care at these facilities.  PASRR submitted to EDS on 09/04/16     PASRR number received on 09/04/16     Existing PASRR number confirmed on       FL2 transmitted to all facilities in geographic area requested by pt/family on       FL2 transmitted to all facilities within larger geographic area on       Patient informed that his/her managed care company has contracts with or will negotiate with certain facilities, including the following:        Yes   Patient/family informed of bed offers received.  Patient chooses bed at  [facility]     Physician recommends and patient chooses bed at      Patient to be transferred to  [facility] on 09/23/16.  Patient to be transferred to facility by Kathie Rhodes(S) [facility]     Patient family notified on 09/23/16 of transfer.  Name of family member notified:  Chip [phone]     PHYSICIAN Please sign FL2     Additional Comment:     _______________________________________________ Norlene DuelBROWN, Demari Gales B, LCSWA 09/23/2016, 11:40 AM

## 2016-09-23 NOTE — NC FL2 (Signed)
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, [address], [place], [place] [address]      Provider Number: [id number]  Attending Physician Name and Address:  Calvert CantorSaima Rizwan, MD  Relative Name and Phone Number:  Chip [phone]    Current Level of Care: Hospital Recommended Level of Care: Skilled Nursing Facility Prior Approval Number:    Date Approved/Denied:   PASRR Number: [id number]  Discharge Plan: SNF    Current Diagnoses: Patient Active Problem List   Diagnosis Date Noted  . Severe sepsis (HCC) 09/23/2016  . Bacteremia 09/22/2016  . E. coli UTI 09/22/2016  . Acute encephalopathy   . Palliative care encounter   . Goals of care, counseling/discussion   . Elevated troponin 09/17/2016  . Prolonged Q-T interval on ECG 09/03/2016  . Non-traumatic rhabdomyolysis 09/03/2016  . Effusion of left knee 09/03/2016  . Weakness 09/02/2016  . A-fib (HCC) 09/02/2016  . Hematochezia   . Rectal bleeding 08/22/2016  . Hypothyroidism 08/22/2016  . Alcoholism /alcohol abuse (HCC) 08/22/2016  . Alcoholic hepatitis 08/22/2016  . Hypokalemia 08/22/2016  . Hyperglycemia 08/22/2016  . Thrombocytopenia (HCC) 08/22/2016  . Acute blood loss anemia 08/22/2016  . Hypertension     Orientation RESPIRATION BLADDER Height & Weight     Self, Time, Situation  Normal Continent Weight: 204 lb 11.2 oz (92.9 kg) Height:  5\' 10"  (177.8 cm)  BEHAVIORAL SYMPTOMS/MOOD NEUROLOGICAL BOWEL NUTRITION STATUS      Incontinent Diet (See DC Summary)  AMBULATORY STATUS COMMUNICATION OF NEEDS Skin   Limited Assist Verbally Normal                       Personal Care Assistance Level of Assistance  Bathing, Feeding, Dressing Bathing Assistance: Limited  assistance Feeding assistance: Limited assistance Dressing Assistance: Limited assistance     Functional Limitations Info  Sight, Hearing, Speech Sight Info: Impaired Hearing Info: Adequate Speech Info: Adequate    SPECIAL CARE FACTORS FREQUENCY        PT Frequency: 5x wk OT Frequency: 5x wk            Contractures Contractures Info: Not present    Additional Factors Info  Code Status, Allergies Code Status Info: DNR Allergies Info: No Known Allergies           Current Medications (09/23/2016):  This is the current hospital active medication list Current Facility-Administered Medications  Medication Dose Route Frequency Provider Last Rate Last Dose  . acetaminophen (TYLENOL) tablet 650 mg  650 mg Oral Q6H PRN Ozella Rocksavid J Merrell, MD       Or  . acetaminophen (TYLENOL) suppository 650 mg  650 mg Rectal Q6H PRN Ozella Rocksavid J Merrell, MD      . cephALEXin [facility](KEFLEX) capsule 1,000 mg  1,000 mg Oral Q12H Calvert CantorSaima Rizwan, MD   1,000 mg at 09/23/16 1006  . dextrose 5 % 1,000 mL with potassium chloride 40 mEq infusion   Intravenous Continuous Calvert CantorSaima Rizwan, MD 75 mL/hr at 09/22/16 2230    . folic acid (FOLVITE) tablet 1 mg  1 mg Oral Daily Calvert CantorSaima Rizwan, MD   1 mg at 09/23/16 1006  . heparin injection 5,000 Units  5,000 Units Subcutaneous Q8H Ozella Rocksavid J Merrell, MD   5,000 Units  at 09/23/16 0527  . hydrocortisone (ANUSOL-HC) suppository 25 mg  25 mg Rectal BID PRN Ozella Rocks, MD      . levothyroxine (SYNTHROID, LEVOTHROID) tablet 300 mcg  300 mcg Oral QAC breakfast Ozella Rocks, MD   300 mcg at 09/23/16 0732  . MEDLINE mouth rinse  15 mL Mouth Rinse BID McAdmits Triadhosp, MD   15 mL at 09/23/16 1006  . metoprolol tartrate (LOPRESSOR) tablet 12.5 mg  12.5 mg Oral BID Belkys A Regalado, MD   12.5 mg at 09/23/16 1006  . ondansetron (ZOFRAN) tablet 4 mg  4 mg Oral Q6H PRN Ozella Rocks, MD       Or  . ondansetron [facility]) injection 4 mg  4 mg Intravenous Q6H PRN Ozella Rocks, MD      .  sodium chloride flush (NS) 0.9 % injection 3 mL  3 mL Intravenous Q12H Ozella Rocks, MD   3 mL at 09/23/16 1008     Discharge Medications: Please see discharge summary for a list of discharge medications.  Relevant Imaging Results:  Relevant Lab Results:   Additional Information    Yancy Knoble B, LCSWA

## 2016-09-23 NOTE — Progress Notes (Signed)
[facility]  Patient examined today. Chart reviewed. He continues to be stable for discharge. Please see my d/c summary from 09/22/16.   Calvert CantorSaima Juanelle Trueheart, MD

## 2016-09-23 NOTE — Clinical Social Work Note (Signed)
CSW notified pt is ready for DC. [facility]W contacted [facility] to advise, no answer, left  Admissions a VM with contact info. CSW will continue to follow.  CSW FU with [facility]. CSW spoke to receptionist who reports Admissions and Social Work is out today. CSW advised pt is medically cleared and needs to come to the facility today. Receptionist transferred CSW to [place] (Nursing) who reported pt is ok to come back today. Pt will go to room 124/[place].   Nurse please call [phone] for report.  FL2 completed. DC Summary/Transfer Report sent to facility. Janelle confirmed receipt of docs. CSW updated family; Chip [phone] and nurse. Transport set with PTAR @ 130PM. Pt has no other needs at this time.  CSW is signing off.  Kymiah Araiza B. Gean QuintBrown,MSW, LCSWA Clinical Social Work Dept Weekend Social Worker [phone] 11:43 AM

## 2016-09-23 NOTE — Progress Notes (Signed)
Report called to Lillia AbedLindsay, Charity fundraiserN at [facility]. Await transport by [facility].

## 2016-09-23 NOTE — NC FL2 (Signed)
[place] MEDICAID FL2 LEVEL OF CARE SCREENING TOOL     IDENTIFICATION  Patient Name: Mark Meadows Birthdate: [birth year] Sex: male Admission Date (Current Location): 09/17/2016  [place] and [place] Number:  Museum/gallery exhibitions officer)   Facility and Address:  [facility] [place], [address], [place], [place] [address]      Provider Number: [id number]  Attending Physician Name and Address:  Calvert Cantor, MD  Relative Name and Phone Number:  Chip [phone]    Current Level of Care: Hospital Recommended Level of Care: Skilled Nursing Facility Prior Approval Number:    Date Approved/Denied:   PASRR Number:    Discharge Plan:      Current Diagnoses: Patient Active Problem List   Diagnosis Date Noted  . Severe sepsis (HCC) 09/23/2016  . Bacteremia 09/22/2016  . E. coli UTI 09/22/2016  . Acute encephalopathy   . Palliative care encounter   . Goals of care, counseling/discussion   . Elevated troponin 09/17/2016  . Prolonged Q-T interval on ECG 09/03/2016  . Non-traumatic rhabdomyolysis 09/03/2016  . Effusion of left knee 09/03/2016  . Weakness 09/02/2016  . A-fib (HCC) 09/02/2016  . Hematochezia   . Rectal bleeding 08/22/2016  . Hypothyroidism 08/22/2016  . Alcoholism /alcohol abuse (HCC) 08/22/2016  . Alcoholic hepatitis 08/22/2016  . Hypokalemia 08/22/2016  . Hyperglycemia 08/22/2016  . Thrombocytopenia (HCC) 08/22/2016  . Acute blood loss anemia 08/22/2016  . Hypertension     Orientation RESPIRATION BLADDER Height & Weight            Weight: 204 lb 11.2 oz (92.9 kg) Height:  5\' 10"  (177.8 cm)  BEHAVIORAL SYMPTOMS/MOOD NEUROLOGICAL BOWEL NUTRITION STATUS           AMBULATORY STATUS COMMUNICATION OF NEEDS Skin                               Personal Care Assistance Level of Assistance              Functional Limitations Info             SPECIAL CARE FACTORS FREQUENCY                       Contractures       Additional Factors Info                  Current Medications (09/23/2016):  This is the current hospital active medication list Current Facility-Administered Medications  Medication Dose Route Frequency Provider Last Rate Last Dose  . acetaminophen (TYLENOL) tablet 650 mg  650 mg Oral Q6H PRN Ozella Rocks, MD       Or  . acetaminophen (TYLENOL) suppository 650 mg  650 mg Rectal Q6H PRN Ozella Rocks, MD      . cephALEXin [facility]) capsule 1,000 mg  1,000 mg Oral Q12H Calvert Cantor, MD   1,000 mg at 09/23/16 1006  . dextrose 5 % 1,000 mL with potassium chloride 40 mEq infusion   Intravenous Continuous Calvert Cantor, MD 75 mL/hr at 09/22/16 2230    . folic acid (FOLVITE) tablet 1 mg  1 mg Oral Daily Calvert Cantor, MD   1 mg at 09/23/16 1006  . heparin injection 5,000 Units  5,000 Units Subcutaneous Q8H Ozella Rocks, MD   5,000 Units at 09/23/16 0527  . hydrocortisone (ANUSOL-HC) suppository 25 mg  25 mg Rectal BID PRN Ozella Rocks,  MD      . levothyroxine (SYNTHROID, LEVOTHROID) tablet 300 mcg  300 mcg Oral QAC breakfast Ozella Rocksavid J Merrell, MD   300 mcg at 09/23/16 0732  . MEDLINE mouth rinse  15 mL Mouth Rinse BID McAdmits Triadhosp, MD   15 mL at 09/23/16 1006  . metoprolol tartrate (LOPRESSOR) tablet 12.5 mg  12.5 mg Oral BID Belkys A Regalado, MD   12.5 mg at 09/23/16 1006  . ondansetron (ZOFRAN) tablet 4 mg  4 mg Oral Q6H PRN Ozella Rocksavid J Merrell, MD       Or  . ondansetron [facility](ZOFRAN) injection 4 mg  4 mg Intravenous Q6H PRN Ozella Rocksavid J Merrell, MD      . sodium chloride flush (NS) 0.9 % injection 3 mL  3 mL Intravenous Q12H Ozella Rocksavid J Merrell, MD   3 mL at 09/23/16 1008     Discharge Medications: Please see discharge summary for a list of discharge medications.  Relevant Imaging Results:  Relevant Lab Results:   Additional Information    Kelvon Giannini B, LCSWA

## 2016-09-24 NOTE — Care Management Important Message (Signed)
Important Message  Patient Details  Name: Mark Meadows MRN: [id number] Date of Birth: Feb 26, 1951   Medicare Important Message Given:  Yes    [name] Stefan ChurchBratton 09/24/2016, 8:42 AM

## 2016-09-25 ENCOUNTER — Encounter: Payer: Self-pay | Admitting: Internal Medicine

## 2016-09-25 ENCOUNTER — Non-Acute Institutional Stay (SKILLED_NURSING_FACILITY): Payer: Medicare Other | Admitting: Internal Medicine

## 2016-09-25 DIAGNOSIS — E038 Other specified hypothyroidism: Secondary | ICD-10-CM | POA: Diagnosis not present

## 2016-09-25 DIAGNOSIS — B9620 Unspecified Escherichia coli [E. coli] as the cause of diseases classified elsewhere: Secondary | ICD-10-CM | POA: Diagnosis not present

## 2016-09-25 DIAGNOSIS — D518 Other vitamin B12 deficiency anemias: Secondary | ICD-10-CM

## 2016-09-25 DIAGNOSIS — G9340 Encephalopathy, unspecified: Secondary | ICD-10-CM

## 2016-09-25 DIAGNOSIS — D519 Vitamin B12 deficiency anemia, unspecified: Secondary | ICD-10-CM | POA: Diagnosis not present

## 2016-09-25 DIAGNOSIS — N390 Urinary tract infection, site not specified: Secondary | ICD-10-CM | POA: Diagnosis not present

## 2016-09-25 LAB — BASIC METABOLIC PANEL
BUN: 16 mg/dL (ref 4–21)
CREATININE: 0.6 mg/dL (ref 0.6–1.3)
Glucose: 85 mg/dL
POTASSIUM: 3.4 mmol/L (ref 3.4–5.3)
SODIUM: 142 mmol/L (ref 137–147)

## 2016-09-25 LAB — CBC AND DIFFERENTIAL
HEMATOCRIT: 31 % — AB (ref 41–53)
Hemoglobin: 10.2 g/dL — AB (ref 13.5–17.5)
Platelets: 195 10*3/uL (ref 150–399)
WBC: 10.4 10^3/mL

## 2016-09-25 NOTE — Assessment & Plan Note (Signed)
Keflex will be continued; clinically no sign of sepsis

## 2016-09-25 NOTE — Assessment & Plan Note (Signed)
Continue daily oral vitamin B12 and folate Vitamin B12 intramuscularly weekly 4 then monthly thereafter

## 2016-09-25 NOTE — Patient Instructions (Addendum)
See Current Assessment & Plan in Problem List under specific Diagnosis 

## 2016-09-25 NOTE — Progress Notes (Signed)
   [facility]: [address]  Mark Meadows,Mark Meadows, [place] [id number] [place] [id number]  This is a nursing facility follow up for Nursing Facility readmission within 30 days  Interim medical record and care since [facility] was updated with review of diagnostic studies and change in clinical status since last visit were documented.  HPI: The patient was hospitalized 09/17/16-09/22/16 with progressive encephalopathy. He also was noted to be febrile in the emergency room with a temperature of 101.9. Prior to transport to the hospital he had been manipulating his Foley catheter. He was found to have Escherichia coli urinary tract infection and bacteremia. There was mild elevation of troponin in the setting of sepsis. He received parenteral medications and then was transitioned to Keflex for a total of 2 week course of antibiotics. Lactic acid was elevated to 5 but returned to 1.2. He received stress dose steroids for adrenal insufficiency prophylaxis. He exhibited hypernatremia and hypokalemia which were resolved with supplementation. He received folate and B12 supplements for documented deficiency. He received thyroid supplementation for TSH of 32. Chest x-ray suggested atelectasis in the left base. Serially his anemia improved., Discharge hemoglobin was 10.1 and hematocrit 31.0. The macrocytosis was also improving with the folate and B12 supplements.  Review of systems: Dementia invalidated responses. Date given as "January 18, 18." He gave me his correct birthdate but said he was [age] ,not [age]. He confabulated about his hospitalization being from "poison from the gut". He then talked about his father having a son in this same building several years ago. He also talked about having to squeeze his temples "to help correct the issues"  Physical exam:  Pertinent or positive findings:He had exotropia of the right eye initially, but he exhibited alternating  exotropia without nystagmus. There was decreased  medial movement of the right eye intermittently with extraocular motion Head is shaven. He has a full beard and mustache. Breath sounds are decreased. Pedal pulses are decreased. He has trace edema.   General appearance:Adequately nourished; no acute distress , increased work of breathing is present.   Lymphatic: No lymphadenopathy about the head, neck, axilla . Eyes: No conjunctival inflammation or lid edema is present. There is no scleral icterus. Ears:  External ear exam shows no significant lesions or deformities.   Nose:  External nasal examination shows no deformity or inflammation. Nasal mucosa are pink and moist without lesions ,exudates Oral exam: lips and gums are healthy appearing.There is no oropharyngeal erythema or exudate . Neck:  No thyromegaly, masses, tenderness noted.    Heart:  Normal rate and regular rhythm. S1 and S2 normal without gallop, murmur, click, rub .  Lungs:Chest clear to auscultation without wheezes, rhonchi,rales , rubs. Abdomen:Bowel sounds are normal. Abdomen is soft and nontender with no organomegaly, hernias,masses. GU: deferred  Extremities:  No cyanosis, clubbing  Neurologic exam : Strength equal  in upper & lower extremities Balance,Rhomberg,finger to nose testing could not be completed due to clinical state Deep tendon reflexes are equal but 1/2+ Skin: Warm & dry w/o tenting. No significant lesions or rash.   See summary under each active problem in the Problem List with associated updated therapeutic plan

## 2016-09-25 NOTE — Assessment & Plan Note (Signed)
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

## 2016-09-25 NOTE — Assessment & Plan Note (Signed)
Monitor TSH on the high-dose supplementation

## 2016-11-05 ENCOUNTER — Non-Acute Institutional Stay (SKILLED_NURSING_FACILITY): Payer: Medicare Other | Admitting: Nurse Practitioner

## 2016-11-05 DIAGNOSIS — R531 Weakness: Secondary | ICD-10-CM | POA: Diagnosis not present

## 2016-11-05 DIAGNOSIS — D518 Other vitamin B12 deficiency anemias: Secondary | ICD-10-CM

## 2016-11-05 DIAGNOSIS — I10 Essential (primary) hypertension: Secondary | ICD-10-CM

## 2016-11-05 DIAGNOSIS — E038 Other specified hypothyroidism: Secondary | ICD-10-CM

## 2016-11-05 DIAGNOSIS — D519 Vitamin B12 deficiency anemia, unspecified: Secondary | ICD-10-CM

## 2016-11-05 DIAGNOSIS — N401 Enlarged prostate with lower urinary tract symptoms: Secondary | ICD-10-CM

## 2016-11-05 DIAGNOSIS — R351 Nocturia: Secondary | ICD-10-CM

## 2016-11-05 NOTE — Progress Notes (Signed)
Nursing Home Location: [facility]   Place of Service: SNF (31)  PCP: Elizabeth Palau, FNP  No Known Allergies  Chief Complaint  Patient presents with  . Medical Management of Chronic Issues    Routine Visit    HPI:  Patient is a 66 y.o. male seen today at [facility] for routine follow up on chronic conditions. Pt with hx of ETOH abuse, hypothyroidism, BPH, htn. He was hospitalized 09/17/16-09/22/16 with progressive encephalopathy. He also was noted to be febrile in the emergency room with a temperature of 101.9. Prior to transport to the hospital he had been manipulating his Foley catheter. He was found to have Escherichia coli urinary tract infection and bacteremia. There was mild elevation of troponin in the setting of sepsis. He received parenteral medications and then was transitioned to Keflex for a total of 2 week course of antibiotics.  He received folate and B12 supplements for documented deficiency. He received thyroid supplementation for TSH of 32. Chest x-ray suggested atelectasis in the left base. Hes weakness is improving with therapy. Returning to baseline so he can return home. Pt has no acute issues today.     Review of Systems:  Review of Systems  Constitutional: Negative for activity change, appetite change, fatigue and unexpected weight change.  HENT: Negative for congestion and hearing loss.   Eyes: Negative.   Respiratory: Negative for cough and shortness of breath.   Cardiovascular: Negative for chest pain, palpitations and leg swelling.  Gastrointestinal: Negative for abdominal pain, constipation and diarrhea.  Genitourinary: Negative for difficulty urinating and dysuria.  Musculoskeletal: Negative for arthralgias and myalgias.  Skin: Negative for color change and wound.  Neurological: Negative for dizziness and weakness.  Psychiatric/Behavioral: Negative for agitation, behavioral problems and confusion.    Past Medical History:    Diagnosis Date  . Acute blood loss anemia   . Alcohol dependence (HCC)   . Alcoholic hepatitis   . Dysrhythmia    "it doesn't move exactly the same; this is recent dx" (09/02/2016)  . GERD (gastroesophageal reflux disease)   . Gout   . Hematochezia   . Hyperglycemia   . Hypertension   . Hypokalemia   . Hypothyroidism   . Thrombocytopenia (HCC)    Past Surgical History:  Procedure Laterality Date  . COLONOSCOPY N/A 08/26/2016   Procedure: COLONOSCOPY;  Surgeon: Meryl Dare, MD;  Location: [facility] ENDOSCOPY;  Service: Endoscopy;  Laterality: N/A;  . TONSILLECTOMY  1957   Social History:   reports that he quit smoking about 38 years ago. His smoking use included Cigarettes. He has a 36.00 pack-year smoking history. He has never used smokeless tobacco. He reports that he drinks about 48.0 oz of alcohol per week . He reports that he uses drugs, including Marijuana.  Family History  Problem Relation Age of Onset  . Family history unknown: Yes    Medications: Patient's Medications  New Prescriptions   No medications on file  Previous Medications   AMLODIPINE (NORVASC) 10 MG TABLET    Take 1 tablet (10 mg total) by mouth daily.   FOLIC ACID (FOLVITE) 1 MG TABLET    Take 1 tablet (1 mg total) by mouth daily.   IBUPROFEN (ADVIL,MOTRIN) 200 MG TABLET    Take 400 mg by mouth every 6 (six) hours as needed (pain).    LEVOTHYROXINE (SYNTHROID, LEVOTHROID) 300 MCG TABLET    Take 1 tablet (300 mcg total) by mouth daily before breakfast.   METOPROLOL TARTRATE (LOPRESSOR)  25 MG TABLET    Take 0.5 tablets (12.5 mg total) by mouth 2 (two) times daily.   PANTOPRAZOLE (PROTONIX) 40 MG TABLET    Take 1 tablet (40 mg total) by mouth 2 (two) times daily.   SPIRONOLACTONE (ALDACTONE) 25 MG TABLET    Take 25 mg by mouth daily.   TAMSULOSIN (FLOMAX) 0.4 MG CAPS CAPSULE    Take 1 capsule (0.4 mg total) by mouth daily after supper.   THIAMINE 100 MG TABLET    Take 1 tablet (100 mg total) by mouth daily.    VITAMIN B-12 (CYANOCOBALAMIN) 1000 MCG TABLET    Take 1,000 mcg by mouth once a week.    WITCH HAZEL (HEMORRHOIDAL EX)    Use twice daily as needed for hemorrhoids.  Modified Medications   No medications on file  Discontinued Medications   CEPHALEXIN (KEFLEX) 500 MG CAPSULE    Take 2 capsules (1,000 mg total) by mouth every 12 (twelve) hours.   CYANOCOBALAMIN (,VITAMIN B-12,) 1000 MCG/ML INJECTION    Inject 1 mL (1,000 mcg total) into the muscle every 30 (thirty) days. Stop date 10/01/16   HYDROCORTISONE (ANUSOL-HC) 25 MG SUPPOSITORY    Place 25 mg rectally 2 (two) times daily as needed for hemorrhoids or itching.     Physical Exam: Vitals:   11/05/16 1612  BP: 130/82  Pulse: 84  Resp: 19  Temp: 97.2 F (36.2 C)  SpO2: 99%  Weight: 204 lb (92.5 kg)  Height: 5\' 10"  (1.778 m)    Physical Exam  Constitutional: He is oriented to person, place, and time. He appears well-developed and well-nourished. No distress.  HENT:  Head: Normocephalic and atraumatic.  Mouth/Throat: Oropharynx is clear and moist. No oropharyngeal exudate.  Eyes: Conjunctivae and EOM are normal. Pupils are equal, round, and reactive to light.  Neck: Normal range of motion. Neck supple.  Cardiovascular: Normal rate, regular rhythm and normal heart sounds.   Pulmonary/Chest: Effort normal and breath sounds normal.  Abdominal: Soft. Bowel sounds are normal.  Musculoskeletal: He exhibits no edema or tenderness.  Neurological: He is alert and oriented to person, place, and time.  Skin: Skin is warm and dry. He is not diaphoretic.  Psychiatric: He has a normal mood and affect.    Labs reviewed: Basic Metabolic Panel:  Recent Labs  45/40/98 0422 08/26/16 0603  09/02/16 1659  09/21/16 0603 09/22/16 1156 09/23/16 0427 09/25/16  NA 134* 140  < > 138  < > 147* 140 137 142  K 3.8 3.4*  < > 3.2*  < > 3.3* 3.5 3.5 3.4  CL 104 109  < > 103  < > 115* 104 104  --   CO2 23 21*  < > 27  < > 24 28 25   --   GLUCOSE  127* 124*  < > 141*  < > 86 100* 94  --   BUN 8 7  < > 14  < > 19 9 9 16   CREATININE 0.98 0.96  < > 0.90  < > 0.83 0.70 0.73 0.6  CALCIUM 8.1* 8.4*  < > 9.2  < > 8.7* 8.5* 8.5*  --   MG 2.0 1.8  --  1.8  --   --   --   --   --   < > = values in this interval not displayed. Liver Function Tests:  Recent Labs  09/04/16 0137 09/17/16 0749 09/18/16 0221  AST 112* 115* 86*  ALT 62 41 39  ALKPHOS 112 329* 176*  BILITOT 1.5* 1.4* 1.0  PROT 6.5 6.7 6.0*  ALBUMIN 2.4* 2.3* 1.9*    Recent Labs  08/27/16 0549 09/02/16 1659 09/17/16 0749  LIPASE 35 18 18    Recent Labs  08/27/16 0549 09/02/16 1659 09/18/16 0849  AMMONIA 48* 14 14   CBC:  Recent Labs  08/24/16 0300  09/02/16 1659  09/17/16 0749  09/20/16 0655 09/21/16 0603 09/22/16 1156 09/25/16  WBC 5.6  < > 15.6*  < > 13.6*  < > 18.0* 14.1* 13.3* 10.4  NEUTROABS 3.8  --  12.9*  --  13.2*  --   --   --   --   --   HGB 8.3*  < > 9.4*  < > 9.3*  < > 7.9* 8.4* 10.1* 10.2*  HCT 23.7*  < > 27.6*  < > 28.5*  < > 25.5* 27.0* 31.0* 31*  MCV 113.4*  < > 116.5*  < > 111.3*  < > 113.8* 113.9* 109.9*  --   PLT 67*  < > 274  < > 240  < > 150 157 176 195  < > = values in this interval not displayed. TSH:  Recent Labs  08/25/16 2350 08/28/16 0001 09/02/16 1659  TSH 31.630* 62.857* 32.635*   A1C: Lab Results  Component Value Date   HGBA1C 4.9 08/22/2016   Lipid Panel:  Recent Labs  09/03/16 0254  CHOL 159  HDL 26*  LDLCALC 110*  TRIG 117  CHOLHDL 6.1   09/25/16 Retic count: 4.78 09/25/16 RBC: 2.78 09/25/16 Retic Absolute: 133 09/25/16 B12: 1324 09/25/16 Iron: 65 09/25/16 TIBC: 202 09/25/16 Ferritin: 1278 09/25/16 Folic Acid:5.4   Assessment/Plan 1. Macrocytic anemia with vitamin B12 deficiency Improved, will dc b12 injection and cont PO supplement, will follow up b12 level in 6 weeks  2. Other specified hypothyroidism Will follow up TSH  3. Essential hypertension Stable to cont current regimen   4.  Weakness Significantly improved with therapy  5. Benign prostatic hyperplasia with nocturia Stable conts on flomax       Chaniah Cisse K. Biagio BorgEubanks, AGNP  [facility] [phone](Monday-Friday 8 am - 5 pm) [phone] (after hours)

## 2016-11-26 ENCOUNTER — Encounter: Payer: Self-pay | Admitting: Nurse Practitioner

## 2016-11-26 ENCOUNTER — Non-Acute Institutional Stay (SKILLED_NURSING_FACILITY): Payer: Medicare Other | Admitting: Nurse Practitioner

## 2016-11-26 DIAGNOSIS — I10 Essential (primary) hypertension: Secondary | ICD-10-CM

## 2016-11-26 DIAGNOSIS — N401 Enlarged prostate with lower urinary tract symptoms: Secondary | ICD-10-CM | POA: Diagnosis not present

## 2016-11-26 DIAGNOSIS — F1020 Alcohol dependence, uncomplicated: Secondary | ICD-10-CM | POA: Diagnosis not present

## 2016-11-26 DIAGNOSIS — D519 Vitamin B12 deficiency anemia, unspecified: Secondary | ICD-10-CM | POA: Diagnosis not present

## 2016-11-26 DIAGNOSIS — R351 Nocturia: Secondary | ICD-10-CM | POA: Diagnosis not present

## 2016-11-26 DIAGNOSIS — E038 Other specified hypothyroidism: Secondary | ICD-10-CM | POA: Diagnosis not present

## 2016-11-26 DIAGNOSIS — D518 Other vitamin B12 deficiency anemias: Secondary | ICD-10-CM

## 2016-11-26 DIAGNOSIS — IMO0001 Reserved for inherently not codable concepts without codable children: Secondary | ICD-10-CM

## 2016-11-26 NOTE — Progress Notes (Signed)
Nursing Home Location: [facility]   Place of Service: SNF (31)  PCP: Elizabeth Palau, FNP  No Known Allergies  Chief Complaint  Patient presents with  . Discharge Note    Resident is discharging from SNF    HPI:  Patient is a 66 y.o. male seen today at [facility] for discharge home. Pt with hx of ETOH abuse, hypothyroidism, BPH, htn. Pt has been doing well at [facility] for rehab after hospitalization for acute encephalopathy with UTI and sepsis.    Review of Systems:  Review of Systems  Constitutional: Negative for activity change, appetite change, fatigue and unexpected weight change.  HENT: Negative for congestion and hearing loss.   Eyes: Negative.   Respiratory: Negative for cough and shortness of breath.   Cardiovascular: Negative for chest pain, palpitations and leg swelling.  Gastrointestinal: Negative for abdominal pain, constipation and diarrhea.  Genitourinary: Negative for difficulty urinating and dysuria.  Musculoskeletal: Negative for arthralgias and myalgias.  Skin: Negative for color change and wound.  Neurological: Negative for dizziness and weakness.  Psychiatric/Behavioral: Negative for agitation, behavioral problems and confusion.    Past Medical History:  Diagnosis Date  . Acute blood loss anemia   . Alcohol dependence (HCC)   . Alcoholic hepatitis   . Dysrhythmia    "it doesn't move exactly the same; this is recent dx" (09/02/2016)  . GERD (gastroesophageal reflux disease)   . Gout   . Hematochezia   . Hyperglycemia   . Hypertension   . Hypokalemia   . Hypothyroidism   . Thrombocytopenia (HCC)    Past Surgical History:  Procedure Laterality Date  . COLONOSCOPY N/A 08/26/2016   Procedure: COLONOSCOPY;  Surgeon: Meryl Dare, MD;  Location: [facility] ENDOSCOPY;  Service: Endoscopy;  Laterality: N/A;  . TONSILLECTOMY  1957   Social History:   reports that he quit smoking about 38 years ago. His smoking use included  Cigarettes. He has a 36.00 pack-year smoking history. He has never used smokeless tobacco. He reports that he drinks about 48.0 oz of alcohol per week . He reports that he uses drugs, including Marijuana.  Family History  Problem Relation Age of Onset  . Family history unknown: Yes    Medications: Patient's Medications  New Prescriptions   No medications on file  Previous Medications   AMLODIPINE (NORVASC) 10 MG TABLET    Take 1 tablet (10 mg total) by mouth daily.   FOLIC ACID (FOLVITE) 1 MG TABLET    Take 1 tablet (1 mg total) by mouth daily.   IBUPROFEN (ADVIL,MOTRIN) 200 MG TABLET    Take 400 mg by mouth every 6 (six) hours as needed (pain).    LEVOTHYROXINE (SYNTHROID, LEVOTHROID) 300 MCG TABLET    Take 1 tablet (300 mcg total) by mouth daily before breakfast.   METOPROLOL TARTRATE (LOPRESSOR) 25 MG TABLET    Take 0.5 tablets (12.5 mg total) by mouth 2 (two) times daily.   PANTOPRAZOLE (PROTONIX) 40 MG TABLET    Take 1 tablet (40 mg total) by mouth 2 (two) times daily.   SPIRONOLACTONE (ALDACTONE) 25 MG TABLET    Take 25 mg by mouth daily.   TAMSULOSIN (FLOMAX) 0.4 MG CAPS CAPSULE    Take 1 capsule (0.4 mg total) by mouth daily after supper.   THIAMINE 100 MG TABLET    Take 1 tablet (100 mg total) by mouth daily.   VITAMIN B-12 (CYANOCOBALAMIN) 1000 MCG TABLET    Take 1,000 mcg by mouth  once a week.    WITCH HAZEL (HEMORRHOIDAL EX)    Use twice daily as needed for hemorrhoids.  Modified Medications   No medications on file  Discontinued Medications   No medications on file     Physical Exam: Vitals:   11/26/16 1432  BP: 128/72  Pulse: 66  Resp: 20  Temp: 97.8 F (36.6 C)  SpO2: 95%  Weight: 203 lb 9.6 oz (92.4 kg)  Height: 5\' 10"  (1.778 m)    Physical Exam  Constitutional: He is oriented to person, place, and time. He appears well-developed and well-nourished. No distress.  HENT:  Head: Normocephalic and atraumatic.  Mouth/Throat: Oropharynx is clear and moist. No  oropharyngeal exudate.  Neck: Normal range of motion. Neck supple.  Cardiovascular: Normal rate, regular rhythm and normal heart sounds.   Pulmonary/Chest: Effort normal and breath sounds normal.  Abdominal: Soft. Bowel sounds are normal.  Musculoskeletal: He exhibits no edema or tenderness.  Neurological: He is alert and oriented to person, place, and time.  Skin: Skin is warm and dry. He is not diaphoretic.  Psychiatric: He has a normal mood and affect.    Labs reviewed: Basic Metabolic Panel:  Recent Labs  16/06/9611/04/17 0422 08/26/16 0603  09/02/16 1659  09/21/16 0603 09/22/16 1156 09/23/16 0427 09/25/16  NA 134* 140  < > 138  < > 147* 140 137 142  K 3.8 3.4*  < > 3.2*  < > 3.3* 3.5 3.5 3.4  CL 104 109  < > 103  < > 115* 104 104  --   CO2 23 21*  < > 27  < > 24 28 25   --   GLUCOSE 127* 124*  < > 141*  < > 86 100* 94  --   BUN 8 7  < > 14  < > 19 9 9 16   CREATININE 0.98 0.96  < > 0.90  < > 0.83 0.70 0.73 0.6  CALCIUM 8.1* 8.4*  < > 9.2  < > 8.7* 8.5* 8.5*  --   MG 2.0 1.8  --  1.8  --   --   --   --   --   < > = values in this interval not displayed. Liver Function Tests:  Recent Labs  09/04/16 0137 09/17/16 0749 09/18/16 0221  AST 112* 115* 86*  ALT 62 41 39  ALKPHOS 112 329* 176*  BILITOT 1.5* 1.4* 1.0  PROT 6.5 6.7 6.0*  ALBUMIN 2.4* 2.3* 1.9*    Recent Labs  08/27/16 0549 09/02/16 1659 09/17/16 0749  LIPASE 35 18 18    Recent Labs  08/27/16 0549 09/02/16 1659 09/18/16 0849  AMMONIA 48* 14 14   CBC:  Recent Labs  08/24/16 0300  09/02/16 1659  09/17/16 0749  09/20/16 0655 09/21/16 0603 09/22/16 1156 09/25/16  WBC 5.6  < > 15.6*  < > 13.6*  < > 18.0* 14.1* 13.3* 10.4  NEUTROABS 3.8  --  12.9*  --  13.2*  --   --   --   --   --   HGB 8.3*  < > 9.4*  < > 9.3*  < > 7.9* 8.4* 10.1* 10.2*  HCT 23.7*  < > 27.6*  < > 28.5*  < > 25.5* 27.0* 31.0* 31*  MCV 113.4*  < > 116.5*  < > 111.3*  < > 113.8* 113.9* 109.9*  --   PLT 67*  < > 274  < > 240  < >  150 157  176 195  < > = values in this interval not displayed. TSH:  Recent Labs  08/25/16 2350 08/28/16 0001 09/02/16 1659  TSH 31.630* 62.857* 32.635*   A1C: Lab Results  Component Value Date   HGBA1C 4.9 08/22/2016   Lipid Panel:  Recent Labs  09/03/16 0254  CHOL 159  HDL 26*  LDLCALC 110*  TRIG 117  CHOLHDL 6.1    Assessment/Plan 1. Macrocytic anemia with vitamin B12 deficiency Completed B12 injections, conts on B12 PO supplements.   2. Other specified hypothyroidism conts on synthroid 300 mcg, will need follow up TSH by PCP  3. Essential hypertension Blood pressure stable on metoprolol, aldactone and norvasc. To cont medication at this time  4. Benign prostatic hyperplasia with nocturia Stable on flomax  5. Alcoholism /alcohol abuse (HCC) Discussed ETOH abuse with pt, encouraged to cont cessation. To go to AA meetings once he was discharged. Did not seem interested in this at this time.  pt is stable for discharge-will need PT/OT per home health. No DME needed.Rx will be provided by facility due to payer source.  will need to follow up with PCP within 2 weeks.     Janene Harvey. Biagio Borg  [facility] [phone] 8 am - 5 pm) [phone] (after hours)

## 2016-12-03 NOTE — Progress Notes (Signed)
[facility] PREP Progress Report   Patient Details  Name: Mark Meadows MRN: [id number] Date of Birth: [birth year] Age: 66 y.o. PCP: Elizabeth Palau, FNP  Vitals:   12/03/16 1238  BP: (!) 142/90  Pulse: 94  Resp: 18  SpO2: 98%  Weight: 199 lb 3.2 oz (90.4 kg)  Height: 5\' 10"  (1.778 m)         [facility] Eval - 12/03/16 1200      Referral    Referring Provider self   Reason for referral Other   Program Start Date 12/03/16  joined the Y on 12/01/16     Measurement   Waist Circumference 41.7 inches   Body fat 29.7 percent     Information for Trainer   Goals "Gain strength & fitness"   Current Exercise none   Orthopedic Concerns Left knee, Lt shoulder   Current Barriers none     Timed Up and Go (TUGS)   Timed Up and Go Moderate risk 10-12 seconds     Quality of Life Assessment [RPT]   Date of Last Quality of Life Assessment [RPT] --  11.36     Mobility and Daily Activities   I find it easy to walk up or down two or more flights of stairs. 2   I have no trouble taking out the trash. 4   I do housework such as vacuuming and dusting on my own without difficulty. 4   I can easily lift a gallon of milk (8lbs). 4   I can easily walk a mile. 3   I have no trouble reaching into high cupboards or reaching down to pick up something from the floor. 3   I do not have trouble doing out-door work such as Loss adjuster, chartered, raking leaves, or gardening. 2     Mobility and Daily Activities   I feel younger than my age. 3   I feel independent. 4   I feel energetic. 3   I live an active life.  2   I feel strong. 3   I feel healthy. 3   I feel active as other people my age. 4     How fit and strong are you.   Fit and Strong Total Score 44     Past Medical History:  Diagnosis Date  . Acute blood loss anemia   . Alcohol dependence (HCC)   . Alcoholic hepatitis   . Dysrhythmia    "it doesn't move exactly the same; this is recent dx" (09/02/2016)  . GERD (gastroesophageal  reflux disease)   . Gout   . Hematochezia   . Hyperglycemia   . Hypertension   . Hypokalemia   . Hypothyroidism   . Thrombocytopenia (HCC)    Past Surgical History:  Procedure Laterality Date  . COLONOSCOPY N/A 08/26/2016   Procedure: COLONOSCOPY;  Surgeon: Meryl Dare, MD;  Location: [facility] ENDOSCOPY;  Service: Endoscopy;  Laterality: N/A;  . TONSILLECTOMY  1957   History  Smoking Status  . Former Smoker  . Packs/day: 3.00  . Years: 12.00  . Types: Cigarettes  . Quit date: 1980  Smokeless Tobacco  . Never Used   Mark Meadows has joined the [employer] as a walk-in.  He states he was just d/c from [facility] this past Saturday and is interested in building his strength and endurance.  He will be paired with a personal trainer in the next week and will be attending the weekly Wednesday class from 11-noon for the next 12  weeks.    Rose FillersDebbie Shawny Borkowski 12/03/2016, 12:43 PM

## 2016-12-12 NOTE — Progress Notes (Signed)
[facility] PREP Weekly Session   Patient Details  Name: Mark BaliJeffrey Meadows MRN: [id number] Date of Birth: 16-Sep-1951 Age: 66 y.o. PCP: Elizabeth PalauANDERSON,TERESA, FNP  Vitals:   12/12/16 1204  Weight: 200 lb (90.7 kg)        [facility] Weekly seesion - 12/12/16 1200      Weekly Session   Topic Discussed Importance of resistance training   Minutes exercised this week 120 minutes  all cardio   Classes attended to date 1      Fun things you did since last meeting:"read" Things you are grateful for:"dog"  Rose FillersDebbie Brayli Klingbeil 12/12/2016, 12:05 PM

## 2016-12-19 NOTE — Progress Notes (Signed)
[facility] PREP Weekly Session   Patient Details  Name: Nyeem Stoke MRN: [id number] Date of Birth: May 07, 1951 Age: 65 y.o. PCP: Elizabeth Palau, FNP  Vitals:   12/19/16 1418  Weight: 200 lb (90.7 kg)        [facility] Weekly seesion - 12/19/16 1400      Weekly Session   Topic Discussed Expectations and non-scale victories   Minutes exercised this week 1 minutes  ? 1 day   Classes attended to date 2     Fun things you did since last meeting:"reed" Things you are grateful for:"dog" Barriers:"knee"   [name] 12/19/2016, 2:18 PM

## 2016-12-24 NOTE — Progress Notes (Signed)
[facility] PREP Weekly Session   Patient Details  Name: Mark Meadows MRN: [id number] Date of Birth: Sep 24, 1950 Age: 66 y.o. PCP: Elizabeth Palau, FNP  There were no vitals filed for this visit.      [facility] Weekly seesion - 12/24/16 1300      Weekly Session   Topic Discussed Other  portion control   Minutes exercised this week 60 minutes  30cardio/30strength   Classes attended to date 3     Fun things you did since last meeting:"read" Things you are grateful for:"dog"  [name] 12/24/2016, 1:52 PM

## 2017-01-14 NOTE — Progress Notes (Signed)
[facility] PREP Weekly Session   Patient Details  Name: Mark Meadows MRN: [id number] Date of Birth: Aug 05, 1951 Age: 66 y.o. PCP: Elizabeth Palau, FNP  Vitals:   01/14/17 1338  Weight: 205 lb (93 kg)        [facility] Weekly seesion - 01/14/17 1300      Weekly Session   Topic Discussed Hitting roadblocks   Minutes exercised this week 180 minutes  150cardio/30strength   Classes attended to date 4     Fun things you did since last meeting:"read" Things you are grateful for:"dog"   [name] 01/14/2017, 1:39 PM

## 2017-02-27 ENCOUNTER — Telehealth: Payer: Self-pay

## 2017-02-27 NOTE — Telephone Encounter (Signed)
Called [name] as I did on 02/04/17 and left a voice mail since he hasn't been in for a few weeks.  Will follow up again next week.

## 2017-03-16 NOTE — Progress Notes (Signed)
[facility]   Patient Details  Name: Mark BaliJeffrey Goga MRN: [id number] Date of Birth: October 19, 1950 Age: 66 y.o. PCP: Elizabeth PalauAnderson, Teresa, FNP  Vitals:   03/16/17 0928  Weight: 220 lb (99.8 kg)        [facility] Weekly seesion - 03/16/17 0900      Weekly Session   Topic Discussed Importance of resistance training   Minutes exercised this week 0 minutes   Classes attended to date 5     Fun things you did since last meeting:"read" Things you are grateful for:"dog"  Rose FillersDebbie Tasmia Blumer 03/16/2017, 9:29 AM

## 2017-03-18 NOTE — Progress Notes (Signed)
[facility] Weekly [facility]   Patient Details  Name: Mark Meadows MRN: [id number] Date of Birth: 12-06-1950 Age: 66 y.o. PCP: Elizabeth PalauAnderson, Teresa, FNP  Vitals:   03/18/17 1309  Weight: 208 lb (94.3 kg)        [facility] Weekly seesion - 03/18/17 1300      Weekly Session   Topic Discussed Expectations and non-scale victories   Minutes exercised this week --  has been unable to get into the gym   Classes attended to date 6      Things you are grateful for:"dog"  Rose FillersDebbie Laronda Lisby 03/18/2017, 1:10 PM

## 2017-10-02 ENCOUNTER — Emergency Department (HOSPITAL_COMMUNITY): Payer: Medicare Other

## 2017-10-02 ENCOUNTER — Other Ambulatory Visit: Payer: Self-pay

## 2017-10-02 ENCOUNTER — Encounter (HOSPITAL_COMMUNITY): Payer: Self-pay | Admitting: *Deleted

## 2017-10-02 ENCOUNTER — Inpatient Hospital Stay (HOSPITAL_COMMUNITY)
Admission: EM | Admit: 2017-10-02 | Discharge: 2017-10-07 | DRG: 897 | Disposition: A | Discharge status: Home Health | Payer: Medicare Other | Attending: Family Medicine | Admitting: Family Medicine

## 2017-10-02 DIAGNOSIS — Y906 Blood alcohol level of 120-199 mg/100 ml: Secondary | ICD-10-CM | POA: Diagnosis present

## 2017-10-02 DIAGNOSIS — Z66 Do not resuscitate: Secondary | ICD-10-CM | POA: Diagnosis present

## 2017-10-02 DIAGNOSIS — D696 Thrombocytopenia, unspecified: Secondary | ICD-10-CM

## 2017-10-02 DIAGNOSIS — I4891 Unspecified atrial fibrillation: Secondary | ICD-10-CM | POA: Diagnosis not present

## 2017-10-02 DIAGNOSIS — E872 Acidosis: Secondary | ICD-10-CM | POA: Diagnosis present

## 2017-10-02 DIAGNOSIS — F10931 Alcohol use, unspecified with withdrawal delirium: Secondary | ICD-10-CM | POA: Diagnosis present

## 2017-10-02 DIAGNOSIS — I4892 Unspecified atrial flutter: Secondary | ICD-10-CM | POA: Diagnosis present

## 2017-10-02 DIAGNOSIS — F1020 Alcohol dependence, uncomplicated: Secondary | ICD-10-CM | POA: Diagnosis present

## 2017-10-02 DIAGNOSIS — I484 Atypical atrial flutter: Secondary | ICD-10-CM | POA: Diagnosis not present

## 2017-10-02 DIAGNOSIS — G9340 Encephalopathy, unspecified: Secondary | ICD-10-CM | POA: Diagnosis not present

## 2017-10-02 DIAGNOSIS — D519 Vitamin B12 deficiency anemia, unspecified: Secondary | ICD-10-CM | POA: Diagnosis present

## 2017-10-02 DIAGNOSIS — R451 Restlessness and agitation: Secondary | ICD-10-CM | POA: Diagnosis present

## 2017-10-02 DIAGNOSIS — F419 Anxiety disorder, unspecified: Secondary | ICD-10-CM | POA: Diagnosis present

## 2017-10-02 DIAGNOSIS — F10239 Alcohol dependence with withdrawal, unspecified: Secondary | ICD-10-CM | POA: Diagnosis not present

## 2017-10-02 DIAGNOSIS — R4189 Other symptoms and signs involving cognitive functions and awareness: Secondary | ICD-10-CM | POA: Diagnosis present

## 2017-10-02 DIAGNOSIS — M109 Gout, unspecified: Secondary | ICD-10-CM | POA: Diagnosis present

## 2017-10-02 DIAGNOSIS — K219 Gastro-esophageal reflux disease without esophagitis: Secondary | ICD-10-CM | POA: Diagnosis present

## 2017-10-02 DIAGNOSIS — K7030 Alcoholic cirrhosis of liver without ascites: Secondary | ICD-10-CM | POA: Diagnosis present

## 2017-10-02 DIAGNOSIS — N3000 Acute cystitis without hematuria: Secondary | ICD-10-CM | POA: Diagnosis present

## 2017-10-02 DIAGNOSIS — D6959 Other secondary thrombocytopenia: Secondary | ICD-10-CM | POA: Diagnosis present

## 2017-10-02 DIAGNOSIS — Z79899 Other long term (current) drug therapy: Secondary | ICD-10-CM

## 2017-10-02 DIAGNOSIS — F1010 Alcohol abuse, uncomplicated: Secondary | ICD-10-CM

## 2017-10-02 DIAGNOSIS — E039 Hypothyroidism, unspecified: Secondary | ICD-10-CM | POA: Diagnosis present

## 2017-10-02 DIAGNOSIS — F10231 Alcohol dependence with withdrawal delirium: Secondary | ICD-10-CM | POA: Diagnosis present

## 2017-10-02 DIAGNOSIS — D518 Other vitamin B12 deficiency anemias: Secondary | ICD-10-CM | POA: Diagnosis present

## 2017-10-02 DIAGNOSIS — Z87891 Personal history of nicotine dependence: Secondary | ICD-10-CM

## 2017-10-02 DIAGNOSIS — F10939 Alcohol use, unspecified with withdrawal, unspecified: Secondary | ICD-10-CM | POA: Diagnosis present

## 2017-10-02 DIAGNOSIS — K920 Hematemesis: Secondary | ICD-10-CM | POA: Diagnosis present

## 2017-10-02 DIAGNOSIS — R000 Tachycardia, unspecified: Secondary | ICD-10-CM | POA: Diagnosis not present

## 2017-10-02 DIAGNOSIS — Z87440 Personal history of urinary (tract) infections: Secondary | ICD-10-CM | POA: Diagnosis not present

## 2017-10-02 DIAGNOSIS — E876 Hypokalemia: Secondary | ICD-10-CM | POA: Diagnosis not present

## 2017-10-02 DIAGNOSIS — I480 Paroxysmal atrial fibrillation: Secondary | ICD-10-CM | POA: Diagnosis present

## 2017-10-02 DIAGNOSIS — K709 Alcoholic liver disease, unspecified: Secondary | ICD-10-CM | POA: Diagnosis not present

## 2017-10-02 DIAGNOSIS — I483 Typical atrial flutter: Secondary | ICD-10-CM | POA: Diagnosis not present

## 2017-10-02 DIAGNOSIS — I10 Essential (primary) hypertension: Secondary | ICD-10-CM | POA: Diagnosis present

## 2017-10-02 DIAGNOSIS — F10229 Alcohol dependence with intoxication, unspecified: Secondary | ICD-10-CM | POA: Diagnosis present

## 2017-10-02 HISTORY — DX: Unspecified Escherichia coli (E. coli) as the cause of diseases classified elsewhere: B96.20

## 2017-10-02 HISTORY — DX: Urinary tract infection, site not specified: N39.0

## 2017-10-02 LAB — OCCULT BLOOD GASTRIC / DUODENUM (SPECIMEN CUP)
OCCULT BLOOD, GASTRIC: POSITIVE — AB
pH, Gastric: 4

## 2017-10-02 LAB — BASIC METABOLIC PANEL
Anion gap: 23 — ABNORMAL HIGH (ref 5–15)
BUN: 13 mg/dL (ref 6–20)
CO2: 18 mmol/L — ABNORMAL LOW (ref 22–32)
Calcium: 8.8 mg/dL — ABNORMAL LOW (ref 8.9–10.3)
Chloride: 102 mmol/L (ref 101–111)
Creatinine, Ser: 1.21 mg/dL (ref 0.61–1.24)
GFR calc Af Amer: >60 mL/min (ref >60)
GFR calc non Af Amer: >60 mL/min (ref >60)
Glucose, Bld: 107 mg/dL — ABNORMAL HIGH (ref 65–99)
Potassium: 3.8 mmol/L (ref 3.5–5.1)
Sodium: 143 mmol/L (ref 135–145)

## 2017-10-02 LAB — URINALYSIS, MICROSCOPIC (REFLEX)

## 2017-10-02 LAB — URINALYSIS, ROUTINE W REFLEX MICROSCOPIC
Bilirubin Urine: NEGATIVE
Glucose, UA: NEGATIVE mg/dL
Ketones, ur: 40 mg/dL — AB
Nitrite: POSITIVE — AB
Protein, ur: >300 mg/dL — AB
Specific Gravity, Urine: >1.03 — ABNORMAL HIGH (ref 1.005–1.030)
pH: 5.5 (ref 5.0–8.0)

## 2017-10-02 LAB — HEPATIC FUNCTION PANEL
ALT: 51 U/L (ref 17–63)
AST: 89 U/L — ABNORMAL HIGH (ref 15–41)
Albumin: 4.1 g/dL (ref 3.5–5.0)
Alkaline Phosphatase: 82 U/L (ref 38–126)
Bilirubin, Direct: 0.6 mg/dL — ABNORMAL HIGH (ref 0.1–0.5)
Indirect Bilirubin: 1.8 mg/dL — ABNORMAL HIGH (ref 0.3–0.9)
Total Bilirubin: 2.4 mg/dL — ABNORMAL HIGH (ref 0.3–1.2)
Total Protein: 7.9 g/dL (ref 6.5–8.1)

## 2017-10-02 LAB — VITAMIN B12: VITAMIN B 12: 471 pg/mL (ref 180–914)

## 2017-10-02 LAB — CBC
HCT: 39.6 % (ref 39.0–52.0)
Hemoglobin: 13.3 g/dL (ref 13.0–17.0)
MCH: 36.1 pg — ABNORMAL HIGH (ref 26.0–34.0)
MCHC: 33.6 g/dL (ref 30.0–36.0)
MCV: 107.6 fL — ABNORMAL HIGH (ref 78.0–100.0)
Platelets: 133 10*3/uL — ABNORMAL LOW (ref 150–400)
RBC: 3.68 MIL/uL — ABNORMAL LOW (ref 4.22–5.81)
RDW: 14.9 % (ref 11.5–15.5)
WBC: 8.1 10*3/uL (ref 4.0–10.5)

## 2017-10-02 LAB — PROTIME-INR
INR: 1
Prothrombin Time: 13.1 seconds (ref 11.4–15.2)

## 2017-10-02 LAB — MAGNESIUM: Magnesium: 1.6 mg/dL — ABNORMAL LOW (ref 1.7–2.4)

## 2017-10-02 LAB — TSH: TSH: 8.97 u[IU]/mL — ABNORMAL HIGH (ref 0.350–4.500)

## 2017-10-02 LAB — LIPASE, BLOOD: Lipase: 28 U/L (ref 11–51)

## 2017-10-02 LAB — I-STAT TROPONIN, ED: Troponin i, poc: 0 ng/mL (ref 0.00–0.08)

## 2017-10-02 LAB — ETHANOL: Alcohol, Ethyl (B): 164 mg/dL — ABNORMAL HIGH (ref <10)

## 2017-10-02 LAB — PHOSPHORUS: Phosphorus: 2.5 mg/dL (ref 2.5–4.6)

## 2017-10-02 MED ORDER — ACETAMINOPHEN 325 MG PO TABS
650.0000 mg | ORAL_TABLET | Freq: Four times a day (QID) | ORAL | Status: DC | PRN
  Administered 2017-10-06 – 2017-10-07 (×2): 650 mg via ORAL
  Filled 2017-10-02 (×2): qty 2

## 2017-10-02 MED ORDER — MAGNESIUM SULFATE 2 GM/50ML IV SOLN
2.0000 g | Freq: Once | INTRAVENOUS | Status: AC
  Administered 2017-10-02: 2 g via INTRAVENOUS
  Filled 2017-10-02: qty 50

## 2017-10-02 MED ORDER — CHLORDIAZEPOXIDE HCL 5 MG PO CAPS: 10.0000 mg | ORAL_CAPSULE | Freq: Four times a day (QID) | ORAL | Status: DC

## 2017-10-02 MED ORDER — LORAZEPAM 1 MG PO TABS
1.0000 mg | ORAL_TABLET | Freq: Four times a day (QID) | ORAL | Status: DC | PRN
  Administered 2017-10-02: 1 mg via ORAL
  Filled 2017-10-02: qty 1

## 2017-10-02 MED ORDER — LEVOTHYROXINE SODIUM 100 MCG PO TABS
300.0000 ug | ORAL_TABLET | Freq: Every day | ORAL | Status: DC
  Administered 2017-10-03 – 2017-10-06 (×4): 300 ug via ORAL
  Filled 2017-10-02: qty 3
  Filled 2017-10-02: qty 2
  Filled 2017-10-02 (×3): qty 3

## 2017-10-02 MED ORDER — AMLODIPINE BESYLATE 10 MG PO TABS
10.0000 mg | ORAL_TABLET | Freq: Every day | ORAL | Status: DC
  Administered 2017-10-02 – 2017-10-07 (×6): 10 mg via ORAL
  Filled 2017-10-02 (×4): qty 1
  Filled 2017-10-02: qty 2
  Filled 2017-10-02: qty 1

## 2017-10-02 MED ORDER — LORAZEPAM 2 MG/ML IJ SOLN
1.0000 mg | Freq: Four times a day (QID) | INTRAMUSCULAR | Status: DC | PRN
  Administered 2017-10-02 – 2017-10-04 (×3): 1 mg via INTRAVENOUS
  Filled 2017-10-02: qty 1

## 2017-10-02 MED ORDER — LORAZEPAM 2 MG/ML IJ SOLN
2.0000 mg | Freq: Once | INTRAMUSCULAR | Status: AC
  Administered 2017-10-02: 2 mg via INTRAVENOUS
  Filled 2017-10-02: qty 1

## 2017-10-02 MED ORDER — DILTIAZEM HCL-DEXTROSE 100-5 MG/100ML-% IV SOLN (PREMIX)
5.0000 mg/h | INTRAVENOUS | Status: DC
  Administered 2017-10-02: 15 mg/h via INTRAVENOUS
  Administered 2017-10-02: 5 mg/h via INTRAVENOUS
  Administered 2017-10-03 – 2017-10-05 (×7): 15 mg/h via INTRAVENOUS
  Filled 2017-10-02 (×12): qty 100

## 2017-10-02 MED ORDER — LORAZEPAM 2 MG/ML IJ SOLN: 0.0000 mg | Freq: Two times a day (BID) | INTRAMUSCULAR | Status: AC

## 2017-10-02 MED ORDER — VITAMIN B-1 100 MG PO TABS
100.0000 mg | ORAL_TABLET | Freq: Once | ORAL | Status: AC
  Administered 2017-10-02: 100 mg via ORAL
  Filled 2017-10-02: qty 1

## 2017-10-02 MED ORDER — METOCLOPRAMIDE HCL 5 MG/ML IJ SOLN
10.0000 mg | Freq: Four times a day (QID) | INTRAMUSCULAR | Status: DC | PRN
  Administered 2017-10-02: 10 mg via INTRAVENOUS
  Filled 2017-10-02: qty 2

## 2017-10-02 MED ORDER — MAGNESIUM HYDROXIDE 400 MG/5ML PO SUSP: 30.0000 mL | Freq: Every day | ORAL | Status: DC | PRN

## 2017-10-02 MED ORDER — LORAZEPAM 2 MG/ML IJ SOLN
1.0000 mg | Freq: Once | INTRAMUSCULAR | Status: AC
Start: 2017-10-02 — End: 2017-10-02
  Administered 2017-10-02: 1 mg via INTRAVENOUS
  Filled 2017-10-02: qty 1

## 2017-10-02 MED ORDER — LORAZEPAM 2 MG/ML IJ SOLN
0.0000 mg | Freq: Four times a day (QID) | INTRAMUSCULAR | Status: AC
  Administered 2017-10-02 (×2): 2 mg via INTRAVENOUS
  Administered 2017-10-03 (×2): 1 mg via INTRAVENOUS
  Administered 2017-10-03 – 2017-10-04 (×3): 2 mg via INTRAVENOUS
  Filled 2017-10-02: qty 1
  Filled 2017-10-02: qty 2
  Filled 2017-10-02 (×6): qty 1

## 2017-10-02 MED ORDER — SODIUM CHLORIDE 0.9 % IV SOLN
INTRAVENOUS | Status: DC
  Administered 2017-10-02: 19:00:00 via INTRAVENOUS

## 2017-10-02 MED ORDER — PANTOPRAZOLE SODIUM 40 MG IV SOLR
40.0000 mg | Freq: Every day | INTRAVENOUS | Status: DC
  Administered 2017-10-02 – 2017-10-04 (×3): 40 mg via INTRAVENOUS
  Filled 2017-10-02 (×3): qty 40

## 2017-10-02 MED ORDER — ADULT MULTIVITAMIN W/MINERALS CH
1.0000 | ORAL_TABLET | Freq: Once | ORAL | Status: AC
  Administered 2017-10-02: 1 via ORAL
  Filled 2017-10-02: qty 1

## 2017-10-02 MED ORDER — VITAMIN B-1 100 MG PO TABS
100.0000 mg | ORAL_TABLET | Freq: Every day | ORAL | Status: DC
  Administered 2017-10-03 – 2017-10-07 (×5): 100 mg via ORAL
  Filled 2017-10-02 (×5): qty 1

## 2017-10-02 MED ORDER — FOLIC ACID 1 MG PO TABS
1.0000 mg | ORAL_TABLET | Freq: Once | ORAL | Status: AC
  Administered 2017-10-02: 1 mg via ORAL
  Filled 2017-10-02: qty 1

## 2017-10-02 MED ORDER — DILTIAZEM LOAD VIA INFUSION
20.0000 mg | Freq: Once | INTRAVENOUS | Status: AC
  Administered 2017-10-02: 20 mg via INTRAVENOUS
  Filled 2017-10-02: qty 20

## 2017-10-02 MED ORDER — CHLORDIAZEPOXIDE HCL 5 MG PO CAPS
10.0000 mg | ORAL_CAPSULE | Freq: Four times a day (QID) | ORAL | Status: DC
  Administered 2017-10-02 – 2017-10-05 (×11): 10 mg via ORAL
  Filled 2017-10-02 (×11): qty 2

## 2017-10-02 MED ORDER — FOLIC ACID 1 MG PO TABS: 2.0000 mg | ORAL_TABLET | Freq: Once | ORAL | Status: DC

## 2017-10-02 MED ORDER — FOLIC ACID 5 MG/ML IJ SOLN: 1.0000 mg | Freq: Every day | INTRAMUSCULAR | Status: DC

## 2017-10-02 MED ORDER — FOLIC ACID 1 MG PO TABS
1.0000 mg | ORAL_TABLET | Freq: Every day | ORAL | Status: DC
  Administered 2017-10-03 – 2017-10-07 (×5): 1 mg via ORAL
  Filled 2017-10-02 (×5): qty 1

## 2017-10-02 MED ORDER — DEXTROSE 5 % IV SOLN
1.0000 g | INTRAVENOUS | Status: DC
  Administered 2017-10-02 – 2017-10-04 (×3): 1 g via INTRAVENOUS
  Filled 2017-10-02 (×4): qty 10

## 2017-10-02 MED ORDER — ACETAMINOPHEN 650 MG RE SUPP: 650.0000 mg | Freq: Four times a day (QID) | RECTAL | Status: DC | PRN

## 2017-10-02 NOTE — H&P (Signed)
Family Medicine Teaching [facility] Admission History and Physical Service Pager: [phone]  Patient name: Mark Meadows Medical record number: [id number] Date of birth: June 22, 1951 Age: 67 y.o. Gender: male  Primary Care Provider: Elizabeth Palau, FNP Consultants: none Code Status: DNR (confirmed on admission)  Chief Complaint: alcohol withdrawal  Assessment and Plan: Mark Meadows is a 67 y.o. male presenting with alcohol withdrawal, atrial flutter vs fibrillation, and UTI. PMH is significant for chronic alcoholism, frequent UTIs, atrial fibrillation, HTN, hypothyroidism, Vitamin B deficiency.  Alcohol withdrawal with alcoholic ketoacidosis: Acute. Patient consumes about a fifth of whisky per day and has been feeling increased withdrawal symptoms, so he decided to call EMS.  CIWA scores 7>18>23, and primary withdrawal symptoms on admission include nausea and vomiting, tremor, anxiety, agitation, and tactile disturbances.  Patient with visible tremor on exam, had just vomited dark fluid before we entered the room.  Ethanol level significantly elevated at 164 mg/dl.  AST 89, ALT 51.  Bilirubin 2.4.  CBC largely wnl except for elevated MCV.  Anion gap of 23 with bicarb of 18, suggestive of alcoholic ketoacidosis.  Has received one 1 mg Ativan injection followed by three 2 mg Ativan injections.  Patient's vomit positive for occult blood, either due to Mallory-Weis tear from frequent vomiting or varices.  He has a long history of alcohol abuse with prior history of delirium tremens.  His current symptoms and labs are consistent with alcohol withdrawal. - admit to stepdown, attending Dr. Gwendolyn Grant - CIWA protocol - Libirum 10 mg QID in addition to PRN Ativan with CIWA protocol due to severity of withdrawal symptoms - CCM notified since patient Mark require Precedex drip with transfer to ICU - NS infusion @ 125 ml/hr - Aspiration precautions - Protonix 40 mg IV daily - Repeat CMP, CBC in am - vitals  per unit routine with cardiac monitoring and continuous pulse oxygen  Chronic alcoholism: Patient has had previous admissions due to his alcoholism.  Hematochezia has been noted in the past and is a current issue for him, but colonoscopy in 2017 remarkable only for internal hemorrhoids and diverticuli.  Unknown if patient has varices or hepatic cirrhosis.  Abdominal US in 2017 significant for echogenic and enlarged liver likely due to steatosis or chronic hepatitis.  Elevated MCV to 107.6 could be indicative of Vitamin B deficiency due to alcoholism, but patient is not anemic today.  Elevated bilirubin (both indirect and direct) could also point to hepatic insufficiency. - CIWA protocol, which includes folic acid, thiamine, multivitamin - daily CMPs - daily CBCs - social work consult  UTI: Acute. Patient has been experiencing dysuria and increased frequency.  UA positive for UTI.  H/o E. Coli by UCx.  Unsure why this adult male has had multiple UTIs.   - f/u urine culture - start ceftriaxone based on culture history, will narrow when culture and sensitivities return  Unspecified tachycardia: Acute. Suspected a-flutter on admission but now sinus. Patient with HR up to 132 in ED.  Has remote history of PAF though not on anticoagulation.  Asymptomatic, although he was complaining of right sided chest pain earlier today, which has now subsided.  Has been on diltiazem drip at 10 ml/hour in the ED. - repeat EKG in am - continuous cardiac monitoring - continue diltiazem drip  HTN: Chronic. Uncontrolled. Patient diagnosed with HTN in the past, says he takes his blood pressure medications at home, which include amlodipine 10 mg daily and metoprolol 25 mg daily.  It is doubtful that  patient has been compliant with these medications.  He been hypertensive to 188/118 in the ED, but some element of this could be due to his withdrawal. - continue home amlodipine - plan per above for  tachycardia  Hypothyroidism: Patient's last TSH was 8.970, was >62 December 2017.  He is prescribed synthroid 300 mcg daily, but his adherence to this is doubtful.   - restart home synthroid  FEN/GI: NS @ 150 ml/hr, IV protonix, NPO Prophylaxis: SCDs  Disposition: admit to step-down for EtOH withdrawl  History of Present Illness:  Mark Meadows is a 67 y.o. male presenting with alcohol withdrawal, atrial flutter vs sinus tachycardia, and UTI.  Patient says he has consumed alcohol since age 67. Drinks until he passes out weekly. Says he needs "psycological help" for this.  Denies SI/HI, although he has had thoughts of harming himself in the past. Patient called EMS when he realized that his withdrawal symptoms were getting worse.  He feels like he has been getting DTs for the past month which have been getting progressively more intense.  He "wasn't sure if he was going to drop dead." Patient states he had 2 episodes of emesis today.  He did endorse right-sided chest pain but this resolved when he arrived to ED.  Patient has also been experiencing periodic dysuria.  Patient states he was taking his thyroid and his blood pressures meds.  He lives at home along with his dog.  Upon arrival to ED, EtOH lvl 164. Right-sided CP resolved. Troponin neg. EKG originally suspicious for atrial flutter through repeat indicated sinus vs ectopic atrial tachycardia. This was thought to be due to withdrawal. Patient did have an elevated TSH. Patient endorsed last drink yesterday evening and was not taking his home medications. He also endorsed 2 episodes of emesis at home and subsequently had an episode in the ED. He was given x3 doses ativan 1-2 mg. He needed an additional 2 mg during evaluation. He did not appear to be combative or hallucinating. Decision was made to admit to SDU for EtOH withdrawal management under FPTS.  Review Of Systems: Per HPI with the following additions:  Otherwise the remainder of the systems  were negative.  Review of Systems  Constitutional: Negative for chills and fever.  HENT: Negative for congestion and sore throat.   Eyes: Negative for blurred vision and double vision.  Respiratory: Negative for cough, shortness of breath and wheezing.   Cardiovascular: Positive for chest pain. Negative for palpitations and leg swelling.  Gastrointestinal: Positive for blood in stool, nausea and vomiting. Negative for abdominal pain, constipation, diarrhea and melena.  Genitourinary: Positive for dysuria, frequency and urgency. Negative for flank pain.  Musculoskeletal: Negative for myalgias and neck pain.  Skin: Negative for itching and rash.  Neurological: Positive for tremors. Negative for focal weakness and headaches.  Endo/Heme/Allergies: Does not bruise/bleed easily.  Psychiatric/Behavioral: Positive for substance abuse. Negative for depression, hallucinations and suicidal ideas. The patient is not nervous/anxious.     Patient Active Problem List   Diagnosis Date Noted  . Alcohol withdrawal (HCC) 10/02/2017  . Macrocytic anemia with vitamin B12 deficiency 09/25/2016  . Severe sepsis (HCC) 09/23/2016  . Bacteremia 09/22/2016  . E. coli UTI 09/22/2016  . Acute encephalopathy   . Palliative care encounter   . Goals of care, counseling/discussion   . Elevated troponin 09/17/2016  . Prolonged Q-T interval on ECG 09/03/2016  . Non-traumatic rhabdomyolysis 09/03/2016  . Effusion of left knee 09/03/2016  . Weakness 09/02/2016  .  A-fib (HCC) 09/02/2016  . Hematochezia   . Rectal bleeding 08/22/2016  . Hypothyroidism 08/22/2016  . Alcoholism /alcohol abuse (HCC) 08/22/2016  . Alcoholic hepatitis 08/22/2016  . Hypokalemia 08/22/2016  . Hyperglycemia 08/22/2016  . Thrombocytopenia (HCC) 08/22/2016  . Acute blood loss anemia 08/22/2016  . Hypertension     Past Medical History: Past Medical History:  Diagnosis Date  . Acute blood loss anemia   . Alcohol dependence (HCC)   .  Alcoholic hepatitis   . Dysrhythmia    "it doesn't move exactly the same; this is recent dx" (09/02/2016)  . GERD (gastroesophageal reflux disease)   . Gout   . Hematochezia   . Hyperglycemia   . Hypertension   . Hypokalemia   . Hypothyroidism   . Thrombocytopenia (HCC)     Past Surgical History: Past Surgical History:  Procedure Laterality Date  . COLONOSCOPY N/A 08/26/2016   Procedure: COLONOSCOPY;  Surgeon: Meryl Dare, MD;  Location: [facility] ENDOSCOPY;  Service: Endoscopy;  Laterality: N/A;  . TONSILLECTOMY  1957    Social History: Social History   Tobacco Use  . Smoking status: Former Smoker    Packs/day: 3.00    Years: 12.00    Pack years: 36.00    Types: Cigarettes    Last attempt to quit: 1980    Years since quitting: 39.0  . Smokeless tobacco: Never Used  Substance Use Topics  . Alcohol use: Yes    Alcohol/week: 48.0 oz    Types: 80 Shots of liquor per week    Comment: 09/02/2016 "was at 3 large bottles of cheap bourbon/week; cut down to 2 bottles"  . Drug use: Yes    Types: Marijuana    Comment: 09/02/2016 "tried marijuana 2 times; years ago"   Additional social history: Patient lives with his dog [place]. Former smoker, 20 years (2-3 ppd), quit early 20s. Denies illicit drug use. Retired with [employer]. Drives. Patient drinks at end of week usually. Drinks 2 bottles of bourbon per week. Please also refer to relevant sections of EMR.  Family History: Family History  Family history unknown: Yes   Allergies and Medications: No Known Allergies No current facility-administered medications on file prior to encounter.    Current Outpatient Medications on File Prior to Encounter  Medication Sig Dispense Refill  . acetaminophen (TYLENOL) 500 MG tablet Take 500 mg by mouth every 6 (six) hours as needed for mild pain.    Marland Kitchen levothyroxine (SYNTHROID, LEVOTHROID) 300 MCG tablet Take 1 tablet (300 mcg total) by mouth daily before breakfast.  (Patient taking differently: Take 200 mcg by mouth daily before breakfast. ) 30 tablet 0  . omeprazole (PRILOSEC) 20 MG capsule Take 20 mg by mouth daily.    Marland Kitchen amLODipine (NORVASC) 10 MG tablet Take 1 tablet (10 mg total) by mouth daily. 30 tablet 0  . folic acid (FOLVITE) 1 MG tablet Take 1 tablet (1 mg total) by mouth daily. (Patient not taking: Reported on 10/02/2017) 30 tablet 0  . metoprolol tartrate (LOPRESSOR) 25 MG tablet Take 0.5 tablets (12.5 mg total) by mouth 2 (two) times daily. 60 tablet 0  . pantoprazole (PROTONIX) 40 MG tablet Take 1 tablet (40 mg total) by mouth 2 (two) times daily. (Patient not taking: Reported on 10/02/2017) 60 tablet 0  . tamsulosin (FLOMAX) 0.4 MG CAPS capsule Take 1 capsule (0.4 mg total) by mouth daily after supper. (Patient not taking: Reported on 10/02/2017) 30 capsule   . thiamine 100 MG  tablet Take 1 tablet (100 mg total) by mouth daily. (Patient not taking: Reported on 10/02/2017) 30 tablet 0    Objective: BP 135/90 (BP Location: Right Arm)   Pulse (!) 129   Temp 98.7 F (37.1 C) (Oral)   Resp 19   SpO2 98%  Physical Exam  Constitutional: He is oriented to person, place, and time. He appears well-developed.  Disheveled appearance.  Dried brown vomit around mouth  HENT:  Head: Normocephalic and atraumatic.  Eyes: EOM are normal.  Left eye deviates to the left  Neck: Normal range of motion. No hepatojugular reflux present.  Cardiovascular: Regular rhythm and normal pulses. Tachycardia present.  Pulmonary/Chest: Effort normal and breath sounds normal. No respiratory distress.  Abdominal: Soft. Bowel sounds are normal. There is no tenderness.  Musculoskeletal: Normal range of motion.       Right lower leg: Normal.       Left lower leg: Normal.  Neurological: He is alert and oriented to person, place, and time. No cranial nerve deficit.  Tremor present in bilateral hands, worse on right, patient somewhat agitated although not obtunded, no focal  weakness  Psychiatric: His mood appears anxious. He is agitated.   Labs and Imaging: CBC BMET  Recent Labs  Lab 10/02/17 0840  WBC 8.1  HGB 13.3  HCT 39.6  PLT 133*   Recent Labs  Lab 10/02/17 0840  NA 143  K 3.8  CL 102  CO2 18*  BUN 13  CREATININE 1.21  GLUCOSE 107*  CALCIUM 8.8*     Dg Chest 2 View  Result Date: 10/02/2017 CLINICAL DATA:  Right-sided chest pain EXAM: CHEST  2 VIEW COMPARISON:  09/18/2016 FINDINGS: Moderate cardiac enlargement. Large hiatal hernia. No pleural effusion or edema. No airspace opacities. Aortic atherosclerosis noted. IMPRESSION: 1. No acute cardiopulmonary abnormalities. 2. Large hiatal hernia. Electronically Signed   By: Signa Kell M.D.   On: 10/02/2017 09:17     Winfrey, Harlen Labs, MD 10/02/2017, 5:33 PM PGY-1, [facility] FPTS Intern pager: [phone], text pages welcome  Upper Level Addendum: I have seen and evaluated this patient along with Dr. Frances Furbish and reviewed the above note, making necessary revisions in blue.  Durward Parcel, DO [facility], PGY-2

## 2017-10-02 NOTE — ED Notes (Signed)
Pt admits to drinking a fifth of liquor a day-- or 2+ "big bottles-- gallons" -- lives alone except for dog-- needs to arrange care with neighbor if he gets admitted.

## 2017-10-02 NOTE — ED Notes (Signed)
Pt up to use bedside commode. While up and moving around pt got nauseous and asked for something for heartburn. Tray RN notified

## 2017-10-02 NOTE — ED Notes (Signed)
Pt vomited large amount coffee ground emesis-- sent to lab for occult blood.

## 2017-10-02 NOTE — ED Notes (Signed)
Attempting to climb out of bed-- has taken off monitor and BP cuff

## 2017-10-02 NOTE — H&P (Addendum)
FMTS Attending Admission Note:  HPI: 67 year old male with known alcohol dependence, GERD, undifferentiated dysrhythmia, hypothyroidism who presents with concern for worsening alcoholism.  Patient has had ongoing alcoholism since age 67.  He drinks until he passes out every week.  Has anywhere from 1/5 to more of liquor a day.  He has noted some chest pressure and desires help for his alcoholism and therefore called EMS yesterday.  He has been having some vomiting.  He no longer has any chest pain.  He denies any hallucinations.  No fevers or chills.  No trouble breathing.  No abdominal pain.  Exam: Gen:  Alert, cooperative patient who appears stated age in no acute distress.  Vital signs reviewed.  Disheveled appearing male.  Head: [place]/AT.   Eyes:  EOMI, PERRL.   Mouth: Mucous membranes moist.  Fair dental hygiene. Cardiac: Tachycardic with a regular rhythm. Pulm:  Clear to auscultation bilaterally  Abd:  Soft/nondistended/nontender.   Ext:  No LE edema Skin:  Multiple scratches and abrasions across BL forearms.   Neuro: Awake and alert.  He is tremulous on exam.  However he has no other focal neurological deficits.  He is completely oriented to person place and while he is in the hospital currently.  Impression/plan: 1.  Alcoholism: -Patient desires inpatient help and counseling for his alcoholism - Has passive death wish without overt plan or suicidal ideation.   - BAL was 164 this AM.  His slowly withdrawing.   - High CIWA scores but not currently agitated.  Very comfortable in bed. - Admit to SDU for close monitoring.   - I presume his anxiety and tremulousness will worsen as his BAL drops.  CIWA scores may continue to climb. - Contact CCM so they are aware of him if he needs sedation overnight.  - Social work for alcoholism  2.  Hematemesis:  - witnessed in ED - none currently.  - Sent for FOB - Hgb 13.3 at 8 AM this morning.  Likely hemoconcentrated.  Repeat Hgb  3.   Hypothyroidism:  - mild.  Acute illness - do not change thyroid dose.  Repeat as outpatient.  Imp/Plan:  4.  Tachycardia:  - EKG shows sinus tachycardia/atrial origination - on cardizem drip currently.  - no evidence of atrial fibrillation.   - believe tachycardia more likely secondary to his withdrawal from EtOH.   - can wean drip as his W/D symptoms improve.    Chronic issues:  As per resident note.  Tobey GrimWalden, Kiam H, MD 10/02/2017 4:32 PM

## 2017-10-02 NOTE — ED Notes (Signed)
Pt went to the bathroom and did not pee in UA cup.

## 2017-10-02 NOTE — ED Notes (Signed)
Heart healthy lunch tray ordered @1124 

## 2017-10-02 NOTE — ED Notes (Signed)
Pt found at foot of bed with all of electrodes and BP cuff taken off, had put his vest on.

## 2017-10-02 NOTE — ED Provider Notes (Signed)
[facility] EMERGENCY DEPARTMENT Provider Note   CSN: [id number] Arrival date & time: 10/02/17  4098     History   Chief Complaint Chief Complaint  Patient presents with  . Chest Pain    HPI Mark Meadows is a 67 y.o. male.  HPI   67 year old male with right upper quadrant to right chest pain.  Patient first noticed around 8 PM yesterday.  He then continued drinking until he passed out.  He reports that he has a past history of alcohol abuse since he was a teenager and is typical for him to drink until he passes out.  When he woke up this morning he was still having the pain so he presented to the emergency room.  He reports that he has had this pain previously but has not been quite as intense or lasted this long.  No fevers or chills.  Nausea, but no vomiting.  He states that he has been going through "DTs" for the past 2 weeks.  He describes this is feeling tremulous.  He denies any past history of seizures or hallucinations.  Last drink of alcohol was yesterday evening.   Past Medical History:  Diagnosis Date  . Acute blood loss anemia   . Alcohol dependence (HCC)   . Alcoholic hepatitis   . Dysrhythmia    "it doesn't move exactly the same; this is recent dx" (09/02/2016)  . GERD (gastroesophageal reflux disease)   . Gout   . Hematochezia   . Hyperglycemia   . Hypertension   . Hypokalemia   . Hypothyroidism   . Thrombocytopenia [facility])     Patient Active Problem List   Diagnosis Date Noted  . Macrocytic anemia with vitamin B12 deficiency 09/25/2016  . Severe sepsis (HCC) 09/23/2016  . Bacteremia 09/22/2016  . E. coli UTI 09/22/2016  . Acute encephalopathy   . Palliative care encounter   . Goals of care, counseling/discussion   . Elevated troponin 09/17/2016  . Prolonged Q-T interval on ECG 09/03/2016  . Non-traumatic rhabdomyolysis 09/03/2016  . Effusion of left knee 09/03/2016  . Weakness 09/02/2016  . A-fib (HCC) 09/02/2016  . Hematochezia    . Rectal bleeding 08/22/2016  . Hypothyroidism 08/22/2016  . Alcoholism /alcohol abuse (HCC) 08/22/2016  . Alcoholic hepatitis 08/22/2016  . Hypokalemia 08/22/2016  . Hyperglycemia 08/22/2016  . Thrombocytopenia (HCC) 08/22/2016  . Acute blood loss anemia 08/22/2016  . Hypertension     Past Surgical History:  Procedure Laterality Date  . COLONOSCOPY N/A 08/26/2016   Procedure: COLONOSCOPY;  Surgeon: Meryl Dare, MD;  Location: [facility] ENDOSCOPY;  Service: Endoscopy;  Laterality: N/A;  . TONSILLECTOMY  1957       Home Medications    Prior to Admission medications   Medication Sig Start Date End Date Taking? Authorizing Provider  acetaminophen (TYLENOL) 500 MG tablet Take 500 mg by mouth every 6 (six) hours as needed for mild pain.   Yes [provider]  levothyroxine (SYNTHROID, LEVOTHROID) 300 MCG tablet Take 1 tablet (300 mcg total) by mouth daily before breakfast. Patient taking differently: Take 200 mcg by mouth daily before breakfast.  08/27/16  Yes Kathlen Mody, MD  omeprazole (PRILOSEC) 20 MG capsule Take 20 mg by mouth daily.   Yes [provider]  amLODipine (NORVASC) 10 MG tablet Take 1 tablet (10 mg total) by mouth daily. 08/27/16   Kathlen Mody, MD  folic acid (FOLVITE) 1 MG tablet Take 1 tablet (1 mg total) by mouth daily.  Patient not taking: Reported on 10/02/2017 08/28/16   Kathlen Mody, MD  metoprolol tartrate (LOPRESSOR) 25 MG tablet Take 0.5 tablets (12.5 mg total) by mouth 2 (two) times daily. 08/27/16   Kathlen Mody, MD  pantoprazole (PROTONIX) 40 MG tablet Take 1 tablet (40 mg total) by mouth 2 (two) times daily. Patient not taking: Reported on 10/02/2017 08/27/16   Kathlen Mody, MD  tamsulosin (FLOMAX) 0.4 MG CAPS capsule Take 1 capsule (0.4 mg total) by mouth daily after supper. Patient not taking: Reported on 10/02/2017 09/23/16   Calvert Cantor, MD  thiamine 100 MG tablet Take 1 tablet (100 mg total) by mouth daily. Patient not taking:  Reported on 10/02/2017 08/28/16   Kathlen Mody, MD    Family History Family History  Family history unknown: Yes    Social History Social History   Tobacco Use  . Smoking status: Former Smoker    Packs/day: 3.00    Years: 12.00    Pack years: 36.00    Types: Cigarettes    Last attempt to quit: 1980    Years since quitting: 39.0  . Smokeless tobacco: Never Used  Substance Use Topics  . Alcohol use: Yes    Alcohol/week: 48.0 oz    Types: 80 Shots of liquor per week    Comment: 09/02/2016 "was at 3 large bottles of cheap bourbon/week; cut down to 2 bottles"  . Drug use: Yes    Types: Marijuana    Comment: 09/02/2016 "tried marijuana 2 times; years ago"     Allergies   Patient has no known allergies.   Review of Systems Review of Systems  All systems reviewed and negative, other than as noted in HPI.   Physical Exam Updated Vital Signs BP (!) 188/118   Pulse (!) 130   Resp 17   SpO2 96%   Physical Exam  Constitutional: No distress.  Disheveled appearance.  Tremulous.  HENT:  Head: Normocephalic and atraumatic.  Eyes: Conjunctivae are normal. Right eye exhibits no discharge. Left eye exhibits no discharge.  Neck: Neck supple.  Cardiovascular: Regular rhythm and normal heart sounds. Exam reveals no gallop and no friction rub.  No murmur heard. Tachycardic with regular rhythm  Pulmonary/Chest: Effort normal and breath sounds normal. No respiratory distress.  Abdominal: Soft. He exhibits no distension. There is no tenderness.  Musculoskeletal: He exhibits no edema or tenderness.  Neurological: He is alert.  Skin: Skin is warm and dry.  Psychiatric: He has a normal mood and affect. His behavior is normal. Thought content normal.  Nursing note and vitals reviewed.    ED Treatments / Results  Labs (all labs ordered are listed, but only abnormal results are displayed) Labs Reviewed  BASIC METABOLIC PANEL - Abnormal; Notable for the following components:       Result Value   CO2 18 (*)    Glucose, Bld 107 (*)    Calcium 8.8 (*)    Anion gap 23 (*)    All other components within normal limits  CBC - Abnormal; Notable for the following components:   RBC 3.68 (*)    MCV 107.6 (*)    MCH 36.1 (*)    Platelets 133 (*)    All other components within normal limits  TSH - Abnormal; Notable for the following components:   TSH 8.970 (*)    All other components within normal limits  HEPATIC FUNCTION PANEL - Abnormal; Notable for the following components:   AST 89 (*)    Total  Bilirubin 2.4 (*)    Bilirubin, Direct 0.6 (*)    Indirect Bilirubin 1.8 (*)    All other components within normal limits  ETHANOL - Abnormal; Notable for the following components:   Alcohol, Ethyl (B) 164 (*)    All other components within normal limits  URINALYSIS, ROUTINE W REFLEX MICROSCOPIC - Abnormal; Notable for the following components:   APPearance HAZY (*)    Specific Gravity, Urine >1.030 (*)    Hgb urine dipstick MODERATE (*)    Ketones, ur 40 (*)    Protein, ur >300 (*)    Nitrite POSITIVE (*)    Leukocytes, UA SMALL (*)    All other components within normal limits  URINALYSIS, MICROSCOPIC (REFLEX) - Abnormal; Notable for the following components:   Bacteria, UA MANY (*)    Squamous Epithelial / LPF 0-5 (*)    All other components within normal limits  OCCULT BLOOD GASTRIC / DUODENUM (SPECIMEN CUP) - Abnormal; Notable for the following components:   Occult Blood, Gastric POSITIVE (*)    All other components within normal limits  URINE CULTURE  PROTIME-INR  OCCULT BLOOD X 1 CARD TO LAB, STOOL  I-STAT TROPONIN, ED    EKG  EKG Interpretation None       Radiology Dg Chest 2 View  Result Date: 10/02/2017 CLINICAL DATA:  Right-sided chest pain EXAM: CHEST  2 VIEW COMPARISON:  09/18/2016 FINDINGS: Moderate cardiac enlargement. Large hiatal hernia. No pleural effusion or edema. No airspace opacities. Aortic atherosclerosis noted. IMPRESSION: 1. No  acute cardiopulmonary abnormalities. 2. Large hiatal hernia. Electronically Signed   By: Signa Kell M.D.   On: 10/02/2017 09:17    Procedures Procedures (including critical care time)  CRITICAL CARE Performed by: Raeford Razor Total critical care time: 35 minutes, tachycardia requiring diltiazem drip requiring titration.  Multiple doses of benzodiazepines reassessment for alcohol withdrawal. Critical care time was exclusive of separately billable procedures and treating other patients. Critical care was necessary to treat or prevent imminent or life-threatening deterioration. Critical care was time spent personally by me on the following activities: development of treatment plan with patient and/or surrogate as well as nursing, discussions with consultants, evaluation of patient's response to treatment, examination of patient, obtaining history from patient or surrogate, ordering and performing treatments and interventions, ordering and review of laboratory studies, ordering and review of radiographic studies, pulse oximetry and re-evaluation of patient's condition.   Medications Ordered in ED Medications  diltiazem (CARDIZEM) 1 mg/mL load via infusion 20 mg (20 mg Intravenous Bolus from Bag 10/02/17 1048)    And  diltiazem (CARDIZEM) 100 mg in dextrose 5% (1 mg/mL) infusion (7.5 mg/hr Intravenous Rate/Dose Change 10/02/17 1251)  LORazepam (ATIVAN) injection 2 mg (not administered)  LORazepam (ATIVAN) injection 1 mg (1 mg Intravenous Given 10/02/17 1032)  multivitamin with minerals tablet 1 tablet (1 tablet Oral Given 10/02/17 1044)  thiamine (VITAMIN B-1) tablet 100 mg (100 mg Oral Given 10/02/17 1044)  folic acid (FOLVITE) tablet 1 mg (1 mg Oral Given 10/02/17 1044)  LORazepam (ATIVAN) injection 2 mg (2 mg Intravenous Given 10/02/17 1251)     Initial Impression / Assessment and Plan / ED Course  I have reviewed the triage vital signs and the nursing notes.  Pertinent labs &  imaging results that were available during my care of the patient were reviewed by me and considered in my medical decision making (see chart for details).     66yM with right upper quadrant to right  chest pain.  Now resolved.  Noted to be in A. fib with RVR.  In terms of his symptoms, it's unclear as to when he may have gone into A. Fib. He denies a past history of this. His heavy alcohol abuse very well could be contributing.   Labs fairly unremarkable aside from metabolic acidosis with increased anion gap.  Suspect this may be from alcoholic ketosis.  He is afebrile.  He states that he drinks large quantities of liquor.  He denies any other ingestions aside from this.  Alcohol level is still significantly elevated despite not having a drink since before midnight yesterday.  Clinically he is having pretty severe withdrawal symptoms.  PRN benzos.  Clinically it's going to be hard to discriminate how much of his current tachycardia is secondary to withdrawal versus A. Fib.  In terms of his chest/abdominal pain, unclear etiology.  Pretty much resolved at this point.  His abdominal exam is benign.  Consider cholelithiasis with intermittent nature.  Consider gastritis with heavy alcohol abuse.  Consider pancreatitis for the same.    Final Clinical Impressions(s) / ED Diagnoses   Final diagnoses:  Alcohol abuse  Alcohol withdrawal syndrome with complication [facility](HCC)    ED Discharge Orders    None       Raeford RazorKohut, Domitila Stetler, MD 10/02/17 [phone]

## 2017-10-02 NOTE — ED Notes (Signed)
During hrly round patient found out of bed on the bedside commode; pt stated that he had to urinate. Pt soiled both bed and floor with no urine in bedside commode; pt dislodged LA IV. SN stopped IV medications; SN changed linen, provided ADL care to patient; and Inserted new IV and restarted IV medications.

## 2017-10-02 NOTE — ED Triage Notes (Signed)
Pt here from home via [facility] for chest pain.  Pain is R sided, no radiation.  Denies nausea or sob.  Given 2 sl nitro en-route - the first brought pain down from 8 to 6, the 2nd was not reduce pain.  Also given 324 asa.  cbg 132, 196/141, hr 132, rr 18.  Pt drinks  A fifth of liquor every day. Last drink 8 pm last night.  Pt states he always has tremors.

## 2017-10-02 NOTE — Progress Notes (Signed)
Pharmacy Antibiotic Note  Mark Meadows is a 67 y.o. male admitted on 10/02/2017 with UTI.  Pharmacy has been consulted for ceftriaxone dosing. Pt is afebrile and tachycardic with HR 141. WBC WNL.  Plan: Cefriaxone 1gm IV q24h  F/u clinical resolution  Temp (24hrs), Avg:98.7 F (37.1 C), Min:98.7 F (37.1 C), Max:98.7 F (37.1 C)  Recent Labs  Lab 10/02/17 0840  WBC 8.1  CREATININE 1.21    CrCl cannot be calculated (Unknown ideal weight.).    No Known Allergies  Antimicrobials this admission: CTX 1/11 >>   Microbiology results: Pending  Thank you for allowing pharmacy to be a part of this patient's care.  Lyndsi Altic 10/02/2017 5:00 PM

## 2017-10-03 DIAGNOSIS — F1010 Alcohol abuse, uncomplicated: Secondary | ICD-10-CM

## 2017-10-03 LAB — COMPREHENSIVE METABOLIC PANEL
ALBUMIN: 3.7 g/dL (ref 3.5–5.0)
ALT: 41 U/L (ref 17–63)
AST: 60 U/L — AB (ref 15–41)
Alkaline Phosphatase: 76 U/L (ref 38–126)
Anion gap: 12 (ref 5–15)
BUN: 9 mg/dL (ref 6–20)
CHLORIDE: 99 mmol/L — AB (ref 101–111)
CO2: 27 mmol/L (ref 22–32)
Calcium: 9.1 mg/dL (ref 8.9–10.3)
Creatinine, Ser: 0.96 mg/dL (ref 0.61–1.24)
GFR calc non Af Amer: >60 mL/min (ref >60)
GLUCOSE: 126 mg/dL — AB (ref 65–99)
Potassium: 3.2 mmol/L — ABNORMAL LOW (ref 3.5–5.1)
SODIUM: 138 mmol/L (ref 135–145)
Total Bilirubin: 3.8 mg/dL — ABNORMAL HIGH (ref 0.3–1.2)
Total Protein: 7.4 g/dL (ref 6.5–8.1)

## 2017-10-03 LAB — CBC
HCT: 39 % (ref 39.0–52.0)
Hemoglobin: 13.5 g/dL (ref 13.0–17.0)
MCH: 37 pg — AB (ref 26.0–34.0)
MCHC: 34.6 g/dL (ref 30.0–36.0)
MCV: 106.8 fL — AB (ref 78.0–100.0)
PLATELETS: 89 10*3/uL — AB (ref 150–400)
RBC: 3.65 MIL/uL — AB (ref 4.22–5.81)
RDW: 15.1 % (ref 11.5–15.5)
WBC: 7.7 10*3/uL (ref 4.0–10.5)

## 2017-10-03 LAB — MAGNESIUM: Magnesium: 1.8 mg/dL (ref 1.7–2.4)

## 2017-10-03 LAB — MRSA PCR SCREENING: MRSA by PCR: NEGATIVE

## 2017-10-03 MED ORDER — LABETALOL HCL 5 MG/ML IV SOLN
10.0000 mg | INTRAVENOUS | Status: DC | PRN
  Administered 2017-10-03 – 2017-10-04 (×2): 10 mg via INTRAVENOUS
  Filled 2017-10-03 (×2): qty 4

## 2017-10-03 NOTE — ED Notes (Signed)
Attempted report x1. 

## 2017-10-03 NOTE — Progress Notes (Signed)
Pt. HR 140s on 15mg  of Cardizem and maxed out. MD notified and aware.

## 2017-10-03 NOTE — Progress Notes (Signed)
Family Medicine Teaching Service Daily Progress Note Intern Pager: [phone]  Patient name: Mark Meadows Medical record number: [id number] Date of birth: April 23, 1951 Age: 67 y.o. Gender: male  Primary Care Provider: Vicenta Aly, [place] Consultants: None Code Status: DNR (confirmed on admission)  Pt Overview and Major Events to Date:  01/11: Admit for EtOH withdrawl, found to have UTI  Assessment and Plan: Keno Caraway is a 67 y.o. male presenting with alcohol withdrawal, atrial flutter vs fibrillation, and UTI. PMH is significant for chronic alcoholism, frequent UTIs, atrial fibrillation, HTN, hypothyroidism, Vitamin B deficiency.  Alcohol withdrawal with alcoholic ketoacidosis: Acute.  Stable.  Currently on CIWA scoring 12-13 with Librium scheduled and Ativan as needed.  Patient remains without hallucinations and is noncombative.  Patient has a long history of alcohol abuse and endorses drinking 1/5 of whiskey daily.  He intends to drink at discharge but is interested in outpatient detox.  Patient remains intact and patient is without hypoxia or respiratory distress.  I anticipate symptoms worsening over the next 24-48 hours - Continue monitoring and SDU - CIWA protocol with scheduled Librium 10 mg 4 times daily and Ativan as needed - Aspiration precautions - Protonix 40 mg IV daily - Normal saline infusion at 125 mL/hour - Cardiac monitoring and continuous pulse ox - Social work consulted, appreciate recommendations  Upper GI bleed: Acute.  Patient with coffee-ground emesis in ED. Nausea now resolved.  Likely due to frequent alcohol use, alcoholic cirrhosis, and increase abdominal pressure with vomiting.  Initial hemoglobin WNL.  Repeat hemoglobin pending.  No documented esophageal varices though I would not be surprised given his chronic alcohol abuse. - Trending CBC - Plan per above for alcohol withdrawal - Consider GI consult if bleeding persists or patient becomes unstable  Sinus  tachycardia: Acute.  Likely due to alcohol withdrawal and nonadherence to home antihypertensives.  Remains asymptomatic with sustained HR in the 130s.  Repeat EKG without ST changes or T wave abnormalities. - Telemetry - Continue Cardizem drip - Plan per above for alcohol withdrawal  Hypertension: Chronic.  Uncontrolled.  Exacerbated likely due to alcohol withdrawal and nonadherence to antihypertensives. - Continue Cardizem drip and home amlodipine 10 mg daily - Plan per above for alcohol withdrawal  Acute cystitis: UA positive for nitrites.  Patient has history of E. coli based on previous cultures.  Patient did endorse symptoms of UTI.  No signs of pyelonephritis on exam. - Continue day 2 of ceftriaxone - Await urine culture speciation and sensitivities  Hypothyroidism: Chronic.  Uncontrolled.  TSH elevated at 8.97 on admission.  Currently on Synthroid.  Doubt adherence given alcoholism and active withdrawal. -Continue home Synthroid 300 mcg daily  FEN/GI: NS@150mL /hr, IV Protonix, NPO PPx: SCDs (holding anticoagulation given bloody emesis)  Disposition: Pending EtOH withdrawal management, may take several days given high risk for DTs.  Subjective:  Patient states he feels better this morning and wants to go home.  He denies nausea, chest pain, shortness of breath, or visual/auditory hallucinations.  He is very pleased with his care.    Objective: Temp:  [98.3 F (36.8 C)-99.2 F (37.3 C)] 98.3 F (36.8 C) (01/12 0445) Pulse Rate:  [128-136] 133 (01/12 0530) Resp:  [14-27] 14 (01/12 0445) BP: (131-188)/(83-118) 141/104 (01/12 0445) SpO2:  [89 %-99 %] 98 % (01/12 0445) Weight:  [212 lb 8.4 oz (96.4 kg)] 212 lb 8.4 oz (96.4 kg) (01/12 0445) Physical Exam: General: obese disheveled male, NAD with non-toxic appearance HEENT: normocephalic, atraumatic, moist mucous membranes, PERRLA,  EOMI Cardiovascular: regular rate and rhythm without murmurs, rubs, or gallops Lungs: clear to  auscultation bilaterally with normal work of breathing Abdomen: soft, non-tender, non-distended, normoactive bowel sounds Skin: warm, dry, no rashes or lesions, cap refill < 2 seconds Extremities: warm and well perfused, normal tone, no edema Psych: euthymic mood, flat affect, mildly tremulous, calm and pleasant Neuro: A&Ox4, grossly intact moving all extremities  Laboratory: Recent Labs  Lab 10/02/17 0840  WBC 8.1  HGB 13.3  HCT 39.6  PLT 133*   Recent Labs  Lab 10/02/17 0840 10/02/17 1029  NA 143  --   K 3.8  --   CL 102  --   CO2 18*  --   BUN 13  --   CREATININE 1.21  --   CALCIUM 8.8*  --   PROT  --  7.9  BILITOT  --  2.4*  ALKPHOS  --  82  ALT  --  51  AST  --  89*  GLUCOSE 107*  --    Hepatic Function Latest Ref Rng & Units 10/02/2017 09/18/2016 09/17/2016  Total Protein 6.5 - 8.1 g/dL 7.9 6.0(L) 6.7  Albumin 3.5 - 5.0 g/dL 4.1 1.9(L) 2.3(L)  AST 15 - 41 U/L 89(H) 86(H) 115(H)  ALT 17 - 63 U/L 51 39 41  Alk Phosphatase 38 - 126 U/L 82 176(H) 329(H)  Total Bilirubin 0.3 - 1.2 mg/dL 2.4(H) 1.0 1.4(H)  Bilirubin, Direct 0.1 - 0.5 mg/dL 0.6(H) - -   Urinalysis    Component Value Date/Time   COLORURINE YELLOW 10/02/2017 1250   APPEARANCEUR HAZY (A) 10/02/2017 1250   LABSPEC >1.030 (H) 10/02/2017 1250   PHURINE 5.5 10/02/2017 1250   GLUCOSEU NEGATIVE 10/02/2017 1250   HGBUR MODERATE (A) 10/02/2017 1250   BILIRUBINUR NEGATIVE 10/02/2017 1250   KETONESUR 40 (A) 10/02/2017 1250   PROTEINUR >300 (A) 10/02/2017 1250   NITRITE POSITIVE (A) 10/02/2017 1250   LEUKOCYTESUR SMALL (A) 10/02/2017 1250   I-STAT troponin: 0.00 TSH: 8.970 (elevated) INR: 1.00 EtOH: 1.64 (elevated) Occult blood gastric/duodenum: Positive, pH 4 Magnesium: 1.6 (low) Phosphorus: 2.5 (WNL) Lipase: 28 (WNL) Folate: Pending B12: 471 (WNL)  Imaging/Diagnostic Tests: DG CHEST 2 VIEW (10/02/17):  No acute cardiopulmonary abnormalities. Large hiatal hernia.    Panola Bing,  DO 10/03/2017, 6:54 AM PGY-2, [place] Intern pager: [phone], text pages welcome

## 2017-10-04 DIAGNOSIS — F10931 Alcohol use, unspecified with withdrawal delirium: Secondary | ICD-10-CM | POA: Diagnosis present

## 2017-10-04 DIAGNOSIS — F10231 Alcohol dependence with withdrawal delirium: Secondary | ICD-10-CM | POA: Diagnosis present

## 2017-10-04 LAB — CBC
HCT: 36.7 % — ABNORMAL LOW (ref 39.0–52.0)
Hemoglobin: 12.9 g/dL — ABNORMAL LOW (ref 13.0–17.0)
MCH: 37.3 pg — AB (ref 26.0–34.0)
MCHC: 35.1 g/dL (ref 30.0–36.0)
MCV: 106.1 fL — ABNORMAL HIGH (ref 78.0–100.0)
PLATELETS: 79 10*3/uL — AB (ref 150–400)
RBC: 3.46 MIL/uL — AB (ref 4.22–5.81)
RDW: 14.8 % (ref 11.5–15.5)
WBC: 8.2 10*3/uL (ref 4.0–10.5)

## 2017-10-04 LAB — COMPREHENSIVE METABOLIC PANEL
ALT: 35 U/L (ref 17–63)
AST: 54 U/L — ABNORMAL HIGH (ref 15–41)
Albumin: 3.5 g/dL (ref 3.5–5.0)
Alkaline Phosphatase: 69 U/L (ref 38–126)
Anion gap: 15 (ref 5–15)
BUN: 7 mg/dL (ref 6–20)
CHLORIDE: 101 mmol/L (ref 101–111)
CO2: 22 mmol/L (ref 22–32)
Calcium: 8.8 mg/dL — ABNORMAL LOW (ref 8.9–10.3)
Creatinine, Ser: 0.96 mg/dL (ref 0.61–1.24)
GFR calc non Af Amer: >60 mL/min (ref >60)
Glucose, Bld: 105 mg/dL — ABNORMAL HIGH (ref 65–99)
Potassium: 2.8 mmol/L — ABNORMAL LOW (ref 3.5–5.1)
SODIUM: 138 mmol/L (ref 135–145)
Total Bilirubin: 2.9 mg/dL — ABNORMAL HIGH (ref 0.3–1.2)
Total Protein: 6.9 g/dL (ref 6.5–8.1)

## 2017-10-04 MED ORDER — LORAZEPAM 1 MG PO TABS: 1.0000 mg | ORAL_TABLET | ORAL | Status: DC | PRN

## 2017-10-04 MED ORDER — POTASSIUM CHLORIDE CRYS ER 20 MEQ PO TBCR
40.0000 meq | EXTENDED_RELEASE_TABLET | Freq: Two times a day (BID) | ORAL | Status: AC
  Administered 2017-10-04 (×2): 40 meq via ORAL
  Filled 2017-10-04 (×2): qty 2

## 2017-10-04 MED ORDER — LORAZEPAM 2 MG/ML IJ SOLN
1.0000 mg | INTRAMUSCULAR | Status: DC | PRN
  Administered 2017-10-04: 1 mg via INTRAVENOUS

## 2017-10-04 MED ORDER — POTASSIUM CHLORIDE IN NACL 20-0.9 MEQ/L-% IV SOLN
INTRAVENOUS | Status: DC
  Administered 2017-10-04: 10:00:00 via INTRAVENOUS
  Filled 2017-10-04: qty 1000

## 2017-10-04 MED ORDER — MAGNESIUM SULFATE 2 GM/50ML IV SOLN
2.0000 g | Freq: Once | INTRAVENOUS | Status: AC
  Administered 2017-10-04: 2 g via INTRAVENOUS
  Filled 2017-10-04: qty 50

## 2017-10-04 MED ORDER — PANTOPRAZOLE SODIUM 40 MG PO TBEC
40.0000 mg | DELAYED_RELEASE_TABLET | Freq: Every day | ORAL | Status: DC
  Administered 2017-10-05 – 2017-10-07 (×3): 40 mg via ORAL
  Filled 2017-10-04 (×3): qty 1

## 2017-10-04 MED ORDER — LORAZEPAM 2 MG/ML IJ SOLN
1.0000 mg | Freq: Four times a day (QID) | INTRAMUSCULAR | Status: DC | PRN
  Filled 2017-10-04: qty 1

## 2017-10-04 NOTE — Progress Notes (Signed)
Family Medicine Teaching Service Daily Progress Note Intern Pager: [phone]  Patient name: Mark Meadows Medical record number: [id number] Date of birth: 1950-10-22 Age: 67 y.o. Gender: male  Primary Care Provider: Vicenta Aly, [place] Consultants: None Code Status: DNR (confirmed on admission)  Pt Overview and Major Events to Date:  01/11: Admit for EtOH withdrawl, found to have UTI  Assessment and Plan: Mark Meadows is a 67 y.o. male presenting with alcohol withdrawal, atrial flutter vs fibrillation, and UTI. PMH is significant for chronic alcoholism, frequent UTIs, atrial fibrillation, HTN, hypothyroidism, Vitamin B deficiency.  Alcohol withdrawal with alcoholic ketoacidosis: CIWA scores increasing and intermittently tachycardic.  CIWA scoring 13-14 1/13 AM with Librium 28m q4hrs and ativan 142mq4hrs PRN.  Patient remains without hallucinations and is noncombative.  Hypokalemic to 2.8. Magnesium and phos WNL.  Good p.o. Intake.  - Continue monitoring and SDU status - CIWA protocol with scheduled Librium 10 mg 4 times daily and Ativan as needed, currently q4hrs PRN - Aspiration precautions  - Protonix 40 mg IV daily - Cardiac monitoring and continuous pulse ox - Social work consulted, appreciate recommendations - Begin magnesium 2g once -Stopping IV fluids given appropriate p.o. intake - KDUR 4044mBID x1   Upper GI bleed, stable:  Patient with coffee-ground emesis in ED. Nausea now resolved.  Likely due to frequent alcohol use, alcoholic cirrhosis, and increase abdominal pressure with vomiting.  Initial hemoglobin WNL.  Repeat hgb stable 1/13.   - follow CBC - Plan per above for alcohol withdrawal - Consider GI consult if bleeding persists or patient becomes unstable  Sinus tachycardia, intermittent: currently stable. Likely 2/2 alcohol withdrawal. Denies CP, SOB.  - Telemetry - Continue Cardizem drip - labetalol PRN - Plan per above for alcohol withdrawal  Hypertension,  stable:  Exacerbated likely due to alcohol withdrawal and nonadherence to antihypertensives. - Continue Cardizem drip and home amlodipine 10 mg daily - Plan per above for alcohol withdrawal  Acute cystitis: UA positive for nitrites.  Patient has history of E. coli based on previous cultures.  Patient did endorse symptoms of UTI.  No signs of pyelonephritis on exam. Only 20,000 CFU GNR on culture.  - will receive one more dose ceftriaxone 1/13  Hypothyroidism: Chronic.  Uncontrolled.  TSH elevated at 8.97 on admission.  Currently on Synthroid.  Doubt adherence given alcoholism and active withdrawal. -Continue home Synthroid 300 mcg daily  FEN/GI: ICV protonix, heart healthy diet PPx: SCDs (holding anticoagulation given bloody emesis)  Disposition: Pending EtOH withdrawal management, may take several days given high risk for DTs.   Subjective:  Patient feels well and denies any auditory or visual hallucinations, abdominal pain, nausea, CP or SOB.   Objective: Temp:  [97.4 F (36.3 C)-98.4 F (36.9 C)] 98 F (36.7 C) (01/13 0755) Pulse Rate:  [63-139] 123 (01/13 0755) Resp:  [18-21] 19 (01/13 0755) BP: (109-138)/(78-99) 117/85 (01/13 0755) SpO2:  [91 %-97 %] 91 % (01/13 0755) Physical Exam: General: [age]ar old male sitting up in bed appearing comfortable and in no acute distress HEENT: Moist mucous membranes Cardiovascular: Regular rate and rhythm, no apparent murmurs Lungs: Normal work of breathing, faint bibasilar crackles, but no wheezing or rhonchi Abdomen: soft, non-tender, non-distended, normoactive bowel sounds, palpable liver Extremities: warm and well perfused, normal tone, no edema Psych: Pleasant, tangential speech, but appropriate Neuro: Alert and oriented x2, no focal neurological deficits, mildly tremulous  Laboratory: Recent Labs  Lab 10/02/17 0840 10/03/17 0601 10/04/17 0229  WBC 8.1 7.7 8.2  HGB  13.3 13.5 12.9*  HCT 39.6 39.0 36.7*  PLT 133* 89* 79*    Recent Labs  Lab 10/02/17 0840 10/02/17 1029 10/03/17 0601 10/04/17 0229  NA 143  --  138 138  K 3.8  --  3.2* 2.8*  CL 102  --  99* 101  CO2 18*  --  27 22  BUN 13  --  9 7  CREATININE 1.21  --  0.96 0.96  CALCIUM 8.8*  --  9.1 8.8*  PROT  --  7.9 7.4 6.9  BILITOT  --  2.4* 3.8* 2.9*  ALKPHOS  --  82 76 69  ALT  --  51 41 35  AST  --  89* 60* 54*  GLUCOSE 107*  --  126* 105*   Hepatic Function Latest Ref Rng & Units 10/04/2017 10/03/2017 10/02/2017  Total Protein 6.5 - 8.1 g/dL 6.9 7.4 7.9  Albumin 3.5 - 5.0 g/dL 3.5 3.7 4.1  AST 15 - 41 U/L 54(H) 60(H) 89(H)  ALT 17 - 63 U/L 35 41 51  Alk Phosphatase 38 - 126 U/L 69 76 82  Total Bilirubin 0.3 - 1.2 mg/dL 2.9(H) 3.8(H) 2.4(H)  Bilirubin, Direct 0.1 - 0.5 mg/dL - - 0.6(H)   Urinalysis    Component Value Date/Time   COLORURINE YELLOW 10/02/2017 1250   APPEARANCEUR HAZY (A) 10/02/2017 1250   LABSPEC >1.030 (H) 10/02/2017 1250   PHURINE 5.5 10/02/2017 1250   GLUCOSEU NEGATIVE 10/02/2017 1250   HGBUR MODERATE (A) 10/02/2017 1250   BILIRUBINUR NEGATIVE 10/02/2017 1250   KETONESUR 40 (A) 10/02/2017 1250   PROTEINUR >300 (A) 10/02/2017 1250   NITRITE POSITIVE (A) 10/02/2017 1250   LEUKOCYTESUR SMALL (A) 10/02/2017 1250   I-STAT troponin: 0.00 TSH: 8.970 (elevated) INR: 1.00 EtOH: 1.64 (elevated) Occult blood gastric/duodenum: Positive, pH 4 Magnesium: 1.6 (low) Phosphorus: 2.5 (WNL) Lipase: 28 (WNL) Folate: Pending B12: 471 (WNL)  Imaging/Diagnostic Tests: DG CHEST 2 VIEW (10/02/17):  No acute cardiopulmonary abnormalities. Large hiatal hernia.    Eloise Levels, MD 10/04/2017, 8:19 AM PGY-2, [place] Intern pager: [phone], text pages welcome

## 2017-10-05 ENCOUNTER — Encounter (HOSPITAL_COMMUNITY): Payer: Self-pay | Admitting: Family Medicine

## 2017-10-05 ENCOUNTER — Inpatient Hospital Stay (HOSPITAL_COMMUNITY): Payer: Medicare Other

## 2017-10-05 DIAGNOSIS — I4892 Unspecified atrial flutter: Secondary | ICD-10-CM | POA: Diagnosis present

## 2017-10-05 DIAGNOSIS — I483 Typical atrial flutter: Secondary | ICD-10-CM

## 2017-10-05 DIAGNOSIS — E039 Hypothyroidism, unspecified: Secondary | ICD-10-CM

## 2017-10-05 DIAGNOSIS — F1010 Alcohol abuse, uncomplicated: Secondary | ICD-10-CM

## 2017-10-05 DIAGNOSIS — K709 Alcoholic liver disease, unspecified: Secondary | ICD-10-CM

## 2017-10-05 DIAGNOSIS — D519 Vitamin B12 deficiency anemia, unspecified: Secondary | ICD-10-CM

## 2017-10-05 DIAGNOSIS — E876 Hypokalemia: Secondary | ICD-10-CM

## 2017-10-05 DIAGNOSIS — R4189 Other symptoms and signs involving cognitive functions and awareness: Secondary | ICD-10-CM | POA: Diagnosis present

## 2017-10-05 DIAGNOSIS — G9340 Encephalopathy, unspecified: Secondary | ICD-10-CM

## 2017-10-05 LAB — CBC
HCT: 37.3 % — ABNORMAL LOW (ref 39.0–52.0)
Hemoglobin: 13.3 g/dL (ref 13.0–17.0)
MCH: 37.6 pg — ABNORMAL HIGH (ref 26.0–34.0)
MCHC: 35.7 g/dL (ref 30.0–36.0)
MCV: 105.4 fL — ABNORMAL HIGH (ref 78.0–100.0)
PLATELETS: 79 10*3/uL — AB (ref 150–400)
RBC: 3.54 MIL/uL — ABNORMAL LOW (ref 4.22–5.81)
RDW: 14.9 % (ref 11.5–15.5)
WBC: 9.2 10*3/uL (ref 4.0–10.5)

## 2017-10-05 LAB — FOLATE RBC
FOLATE, HEMOLYSATE: 172.2 ng/mL
FOLATE, RBC: 472 ng/mL — AB (ref >498)
Hematocrit: 36.5 % — ABNORMAL LOW (ref 37.5–51.0)

## 2017-10-05 LAB — COMPREHENSIVE METABOLIC PANEL
ALBUMIN: 3.3 g/dL — AB (ref 3.5–5.0)
ALT: 47 U/L (ref 17–63)
ANION GAP: 11 (ref 5–15)
AST: 85 U/L — ABNORMAL HIGH (ref 15–41)
Alkaline Phosphatase: 69 U/L (ref 38–126)
BUN: 6 mg/dL (ref 6–20)
CO2: 22 mmol/L (ref 22–32)
Calcium: 9 mg/dL (ref 8.9–10.3)
Chloride: 103 mmol/L (ref 101–111)
Creatinine, Ser: 1.01 mg/dL (ref 0.61–1.24)
GFR calc Af Amer: >60 mL/min (ref >60)
GFR calc non Af Amer: >60 mL/min (ref >60)
GLUCOSE: 124 mg/dL — AB (ref 65–99)
Potassium: 3.2 mmol/L — ABNORMAL LOW (ref 3.5–5.1)
SODIUM: 136 mmol/L (ref 135–145)
Total Bilirubin: 2.2 mg/dL — ABNORMAL HIGH (ref 0.3–1.2)
Total Protein: 7.3 g/dL (ref 6.5–8.1)

## 2017-10-05 LAB — URINE CULTURE: Culture: 10000 — AB

## 2017-10-05 LAB — MAGNESIUM: Magnesium: 1.4 mg/dL — ABNORMAL LOW (ref 1.7–2.4)

## 2017-10-05 MED ORDER — DILTIAZEM HCL 60 MG PO TABS
30.0000 mg | ORAL_TABLET | Freq: Four times a day (QID) | ORAL | Status: DC
  Administered 2017-10-05 – 2017-10-06 (×4): 30 mg via ORAL
  Filled 2017-10-05 (×4): qty 1

## 2017-10-05 MED ORDER — POTASSIUM CHLORIDE CRYS ER 20 MEQ PO TBCR
40.0000 meq | EXTENDED_RELEASE_TABLET | Freq: Once | ORAL | Status: AC
  Administered 2017-10-05: 40 meq via ORAL
  Filled 2017-10-05: qty 2

## 2017-10-05 MED ORDER — MAGNESIUM SULFATE 2 GM/50ML IV SOLN
2.0000 g | Freq: Once | INTRAVENOUS | Status: AC
  Administered 2017-10-05: 2 g via INTRAVENOUS
  Filled 2017-10-05: qty 50

## 2017-10-05 MED ORDER — DILTIAZEM HCL 30 MG PO TABS
30.0000 mg | ORAL_TABLET | ORAL | Status: AC
  Administered 2017-10-05: 30 mg via ORAL
  Filled 2017-10-05: qty 1

## 2017-10-05 MED ORDER — VITAMIN B-12 1000 MCG PO TABS
1000.0000 ug | ORAL_TABLET | Freq: Every day | ORAL | Status: DC
  Administered 2017-10-06 – 2017-10-07 (×2): 1000 ug via ORAL
  Filled 2017-10-05 (×2): qty 1

## 2017-10-05 NOTE — Evaluation (Signed)
Physical Therapy Evaluation Patient Details Name: Mark Meadows MRN: [id number] DOB: 01/23/1951 Today's Date: 10/05/2017   History of Present Illness  Pt adm with alcohol withdrawal, atrial flutter vs fibrillation, and UTI. PMH - ETOH, gout, afib, htn, vitamin B deficiency  Clinical Impression  Pt presents to PT with poor mobility and impaired cognition. Pt lives alone with little support. Currently recommend ST-SNF unless cogntion and mobility greatly improve.    Follow Up Recommendations SNF    Equipment Recommendations  None recommended by PT    Recommendations for Other Services       Precautions / Restrictions Precautions Precautions: Fall Restrictions Weight Bearing Restrictions: No      Mobility  Bed Mobility Overal bed mobility: Needs Assistance Bed Mobility: Supine to Sit;Sit to Supine     Supine to sit: Supervision;HOB elevated Sit to supine: Min guard   General bed mobility comments: verbal cues to complete coming to the EOB with feet on floor and to straighten himself when returning to supine  Transfers Overall transfer level: Needs assistance Equipment used: Rolling walker (2 wheeled) Transfers: Sit to/from Stand Sit to Stand: Mod assist         General transfer comment: Assist to bring hips up and for balance  Ambulation/Gait Ambulation/Gait assistance: Min assist Ambulation Distance (Feet): 20 Feet Assistive device: Rolling walker (2 wheeled) Gait Pattern/deviations: Step-to pattern;Decreased step length - right;Decreased step length - left;Shuffle;Trunk flexed Gait velocity: Very slow. Took pt >10 minutes to amb 20' Gait velocity interpretation: Below normal speed for age/gender General Gait Details: Assist for balance and verbal cues to step up into walker. HR to 140's with amb.  Stairs            Wheelchair Mobility    Modified Rankin (Stroke Patients Only)       Balance Overall balance assessment: Needs  assistance Sitting-balance support: No upper extremity supported;Feet supported Sitting balance-Leahy Scale: Fair     Standing balance support: Bilateral upper extremity supported Standing balance-Leahy Scale: Poor Standing balance comment: walker and min assist for static standing                             Pertinent Vitals/Pain Pain Assessment: No/denies pain    Home Living Family/patient expects to be discharged to:: Private residence Living Arrangements: Alone   Type of Home: House Home Access: Stairs to enter Entrance Stairs-Rails: Right;Left;Can reach both Entrance Stairs-Number of Steps: 4 Home Layout: One level Home Equipment: Walker - 2 wheels;Cane - single point;Grab bars - tub/shower;Wheelchair - manual      Prior Function Level of Independence: Independent         Comments: Had not been using cane or walker     Hand Dominance        Extremity/Trunk Assessment   Upper Extremity Assessment Upper Extremity Assessment: Defer to OT evaluation    Lower Extremity Assessment Lower Extremity Assessment: Generalized weakness       Communication   Communication: No difficulties  Cognition Arousal/Alertness: Awake/alert Behavior During Therapy: WFL for tasks assessed/performed Overall Cognitive Status: Impaired/Different from baseline Area of Impairment: Orientation;Attention;Memory;Safety/judgement;Following commands;Problem solving                 Orientation Level: Disoriented to;Place;Time;Situation Current Attention Level: Sustained Memory: Decreased recall of precautions;Decreased short-term memory Following Commands: Follows multi-step commands inconsistently Safety/Judgement: Decreased awareness of safety;Decreased awareness of deficits   Problem Solving: Slow processing;Requires verbal cues;Requires tactile cues;Difficulty sequencing General  Comments: Pt repeatedly wanting to clarify if he was still in the hospital. Pt also  told me the construction outside his window was a new train station and that trains were coming through every day.      General Comments      Exercises     Assessment/Plan    PT Assessment Patient needs continued PT services  PT Problem List Decreased strength;Decreased activity tolerance;Decreased balance;Decreased mobility;Decreased cognition;Decreased knowledge of use of DME;Decreased safety awareness       PT Treatment Interventions DME instruction;Gait training;Stair training;Functional mobility training;Therapeutic activities;Therapeutic exercise;Balance training;Patient/family education    PT Goals (Current goals can be found in the Care Plan section)  Acute Rehab PT Goals Patient Stated Goal: go home PT Goal Formulation: With patient Time For Goal Achievement: 10/19/17 Potential to Achieve Goals: Good    Frequency Min 3X/week   Barriers to discharge Inaccessible home environment;Decreased caregiver support Lives alone and stairs to enter    Co-evaluation               AM-PAC PT "6 Clicks" Daily Activity  Outcome Measure Difficulty turning over in bed (including adjusting bedclothes, sheets and blankets)?: A Little Difficulty moving from lying on back to sitting on the side of the bed? : A Lot Difficulty sitting down on and standing up from a chair with arms (e.g., wheelchair, bedside commode, etc,.)?: Unable Help needed moving to and from a bed to chair (including a wheelchair)?: A Lot Help needed walking in hospital room?: A Little Help needed climbing 3-5 steps with a railing? : Total 6 Click Score: 12    End of Session Equipment Utilized During Treatment: Gait belt Activity Tolerance: Patient limited by fatigue Patient left: in bed;with call bell/phone within reach;with bed alarm set;with nursing/sitter in room Nurse Communication: Mobility status PT Visit Diagnosis: Unsteadiness on feet (R26.81);Other abnormalities of gait and mobility (R26.89);Muscle  weakness (generalized) (M62.81)    Time: [id number] PT Time Calculation (min) (ACUTE ONLY): 26 min   Charges:   PT Evaluation $PT Eval Moderate Complexity: 1 Mod PT Treatments $Gait Training: 8-22 mins   PT G Codes:        [facility] Huntley Demedeiros PT [phone]   Angelina OkCary W [facility] 10/05/2017, 4:04 PM

## 2017-10-05 NOTE — Plan of Care (Signed)
Care plan updated.

## 2017-10-05 NOTE — Progress Notes (Signed)
Patient is still CIWA which score is lower. Cardizem drip was infusing at 15 mg/hr, asked Family medicine for switching to PO cardizem, just d/c cardizem. Pt's HR is getting up to 100's and Dr. Iona CoachMcDairmid checked patient and ordered po Cardizem with cardiac consult for a-fib & a flutter. PT was working with pt and HR went up to 140's. Continue to monitor HR. HS McDonald's CorporationLee RN

## 2017-10-05 NOTE — Progress Notes (Signed)
1645 Received pt from 2C. Patient resting in bed. Alert to self and place.

## 2017-10-05 NOTE — Progress Notes (Signed)
Family Medicine Teaching Service Daily Progress Note Intern Pager: [phone]  Patient name: Mark Meadows Medical record number: [id number] Date of birth: 1951-03-04 Age: 67 y.o. Gender: male  Primary Care Provider: Vicenta Aly, [place] Consultants: None Code Status: DNR (confirmed on admission)  Pt Overview and Major Events to Date:  01/11: Admit for EtOH withdrawl, found to have UTI  Assessment and Plan: Mark Meadows is a 67 y.o. male presenting with alcohol withdrawal, atrial flutter vs fibrillation, and UTI. PMH is significant for chronic alcoholism, frequent UTIs, atrial fibrillation, HTN, hypothyroidism, Vitamin B deficiency.  Alcohol withdrawal with alcoholic ketoacidosis: CIWA scores as high as 23 in the ED and intermittently elevated since admission, but low overnight with scores of 3 and 2 on 1/14 AM with Librium 61m q4hrs and ativan 161mq4hrs PRN.  Patient remains without hallucinations and is noncombative.  Hypokalemic to 3.2. Magnesium and phos WNL.  Tachycardia has resolved.  Good p.o. Intake.  Last PRN Ativan was 1/13 AM. - Transfer to med-surg from SDU today - discontinue Librium - CIWA protocol with Ativan as needed, currently q4hrs PRN - Aspiration precautions  - Protonix 40 mg IV daily - Social work consulted, appreciate recommendations - need recs for alcohol rehab, outpatient resources such as AA locations - check daily CMP, Mag - d/c reglan - PT eval  Upper GI bleed, stable:  Patient with coffee-ground emesis in ED. Nausea now resolved.  Likely due to frequent alcohol use, alcoholic cirrhosis, and increase abdominal pressure with vomiting.  Initial hemoglobin WNL.  Repeat hgb stable 1/14.   - follow CBC - Plan per above for alcohol withdrawal - Consider GI consult if bleeding persists or patient becomes unstable  Sinus tachycardia, intermittent: currently resolved with last tachycardic episode 1/13 AM. Likely 2/2 alcohol withdrawal. Denies CP, SOB.  -  Telemetry - Discontinue Cardizem drip - Discontinue labetalol PRN - Plan per above for alcohol withdrawal  Hypertension, stable:  BP 117/73 on 1/14.  Exacerbated likely due to alcohol withdrawal and nonadherence to antihypertensives. - Discontinue Cardizem drip and continue home amlodipine 10 mg daily - Plan per above for alcohol withdrawal - discontinue labetalol   Acute cystitis: UA positive for nitrites.  Patient has history of E. coli based on previous cultures.  Patient did endorse symptoms of UTI.  No signs of pyelonephritis on exam. Only 20,000 CFU GNR on culture.  - discontinue ceftriaxone   Hypothyroidism: Chronic.  Uncontrolled.  TSH elevated at 8.97 on admission.  Currently on Synthroid.  Doubt adherence given alcoholism and active withdrawal. -Continue home Synthroid 300 mcg daily  FEN/GI: ICV protonix, heart healthy diet PPx: SCDs (holding anticoagulation given bloody emesis)  Disposition: Pending EtOH withdrawal management, will likely be able to go home soon  Subjective:  Patient continues to feel well and denies any auditory or visual hallucinations, abdominal pain, nausea, CP or SOB.  He would like to walk around the room and is requesting "psychoanalysis to get to the root of the problem" of his alcoholism and is interested in rehabilitation.  Admits that part of him wants to continue drinking daily.  Objective: Temp:  [97.7 F (36.5 C)-98 F (36.7 C)] 97.7 F (36.5 C) (01/14 0500) Pulse Rate:  [45-123] 88 (01/14 0500) Resp:  [12-26] 22 (01/14 0500) BP: (108-130)/(69-88) 117/73 (01/14 0500) SpO2:  [91 %-100 %] 100 % (01/14 0500) Physical Exam: General: [age]ear old male sitting up in bed appearing comfortable and in no acute distress HEENT: Moist mucous membranes Cardiovascular: Regular rate and rhythm,  no apparent murmurs Lungs: Normal work of breathing, faint bibasilar crackles, but no wheezing or rhonchi Abdomen: soft, non-tender, non-distended, normoactive  bowel sounds, palpable liver Extremities: warm and well perfused, normal tone, no edema Psych: Pleasant, tangential speech, but appropriate Neuro: Alert and oriented x2, no focal neurological deficits, no tremor, some disorganized thought present on exam  Laboratory: Recent Labs  Lab 10/02/17 0840 10/03/17 0601 10/04/17 0229  WBC 8.1 7.7 8.2  HGB 13.3 13.5 12.9*  HCT 39.6 39.0 36.7*  PLT 133* 89* 79*   Recent Labs  Lab 10/02/17 0840 10/02/17 1029 10/03/17 0601 10/04/17 0229  NA 143  --  138 138  K 3.8  --  3.2* 2.8*  CL 102  --  99* 101  CO2 18*  --  27 22  BUN 13  --  9 7  CREATININE 1.21  --  0.96 0.96  CALCIUM 8.8*  --  9.1 8.8*  PROT  --  7.9 7.4 6.9  BILITOT  --  2.4* 3.8* 2.9*  ALKPHOS  --  82 76 69  ALT  --  51 41 35  AST  --  89* 60* 54*  GLUCOSE 107*  --  126* 105*   Hepatic Function Latest Ref Rng & Units 10/04/2017 10/03/2017 10/02/2017  Total Protein 6.5 - 8.1 g/dL 6.9 7.4 7.9  Albumin 3.5 - 5.0 g/dL 3.5 3.7 4.1  AST 15 - 41 U/L 54(H) 60(H) 89(H)  ALT 17 - 63 U/L 35 41 51  Alk Phosphatase 38 - 126 U/L 69 76 82  Total Bilirubin 0.3 - 1.2 mg/dL 2.9(H) 3.8(H) 2.4(H)  Bilirubin, Direct 0.1 - 0.5 mg/dL - - 0.6(H)   Urinalysis    Component Value Date/Time   COLORURINE YELLOW 10/02/2017 1250   APPEARANCEUR HAZY (A) 10/02/2017 1250   LABSPEC >1.030 (H) 10/02/2017 1250   PHURINE 5.5 10/02/2017 1250   GLUCOSEU NEGATIVE 10/02/2017 1250   HGBUR MODERATE (A) 10/02/2017 1250   BILIRUBINUR NEGATIVE 10/02/2017 1250   KETONESUR 40 (A) 10/02/2017 1250   PROTEINUR >300 (A) 10/02/2017 1250   NITRITE POSITIVE (A) 10/02/2017 1250   LEUKOCYTESUR SMALL (A) 10/02/2017 1250   I-STAT troponin: 0.00 TSH: 8.970 (elevated) INR: 1.00 EtOH: 1.64 (elevated) Occult blood gastric/duodenum: Positive, pH 4 Magnesium: 1.6 (low) Phosphorus: 2.5 (WNL) Lipase: 28 (WNL) Folate: Pending B12: 471 (WNL)  Imaging/Diagnostic Tests: DG CHEST 2 VIEW (10/02/17):  No acute  cardiopulmonary abnormalities. Large hiatal hernia.    [name], MD 10/05/2017, 6:52 AM PGY-1, [place] Intern pager: [phone], text pages welcome

## 2017-10-06 ENCOUNTER — Encounter (HOSPITAL_COMMUNITY): Payer: Self-pay | Admitting: Cardiology

## 2017-10-06 ENCOUNTER — Other Ambulatory Visit: Payer: Self-pay

## 2017-10-06 ENCOUNTER — Inpatient Hospital Stay (HOSPITAL_COMMUNITY): Payer: Medicare Other

## 2017-10-06 DIAGNOSIS — I4891 Unspecified atrial fibrillation: Secondary | ICD-10-CM

## 2017-10-06 DIAGNOSIS — F1020 Alcohol dependence, uncomplicated: Secondary | ICD-10-CM

## 2017-10-06 DIAGNOSIS — I484 Atypical atrial flutter: Secondary | ICD-10-CM

## 2017-10-06 DIAGNOSIS — F10231 Alcohol dependence with withdrawal delirium: Principal | ICD-10-CM

## 2017-10-06 DIAGNOSIS — D696 Thrombocytopenia, unspecified: Secondary | ICD-10-CM

## 2017-10-06 LAB — COMPREHENSIVE METABOLIC PANEL
ALT: 69 U/L — AB (ref 17–63)
AST: 113 U/L — AB (ref 15–41)
Albumin: 3.3 g/dL — ABNORMAL LOW (ref 3.5–5.0)
Alkaline Phosphatase: 69 U/L (ref 38–126)
Anion gap: 11 (ref 5–15)
BUN: 12 mg/dL (ref 6–20)
CHLORIDE: 102 mmol/L (ref 101–111)
CO2: 21 mmol/L — AB (ref 22–32)
CREATININE: 1.11 mg/dL (ref 0.61–1.24)
Calcium: 8.9 mg/dL (ref 8.9–10.3)
GFR calc Af Amer: >60 mL/min (ref >60)
GFR calc non Af Amer: >60 mL/min (ref >60)
Glucose, Bld: 117 mg/dL — ABNORMAL HIGH (ref 65–99)
Potassium: 3 mmol/L — ABNORMAL LOW (ref 3.5–5.1)
SODIUM: 134 mmol/L — AB (ref 135–145)
Total Bilirubin: 2.5 mg/dL — ABNORMAL HIGH (ref 0.3–1.2)
Total Protein: 7 g/dL (ref 6.5–8.1)

## 2017-10-06 LAB — ECHOCARDIOGRAM COMPLETE
HEIGHTINCHES: 70 in
WEIGHTICAEL: 3520 [oz_av]

## 2017-10-06 LAB — CBC
HCT: 37.1 % — ABNORMAL LOW (ref 39.0–52.0)
HEMOGLOBIN: 12.8 g/dL — AB (ref 13.0–17.0)
MCH: 36.9 pg — ABNORMAL HIGH (ref 26.0–34.0)
MCHC: 34.5 g/dL (ref 30.0–36.0)
MCV: 106.9 fL — ABNORMAL HIGH (ref 78.0–100.0)
PLATELETS: 83 10*3/uL — AB (ref 150–400)
RBC: 3.47 MIL/uL — ABNORMAL LOW (ref 4.22–5.81)
RDW: 15.2 % (ref 11.5–15.5)
WBC: 9.6 10*3/uL (ref 4.0–10.5)

## 2017-10-06 LAB — AMMONIA: AMMONIA: 23 umol/L (ref 9–35)

## 2017-10-06 LAB — FOLATE: FOLATE: 7.6 ng/mL (ref >5.9)

## 2017-10-06 LAB — MAGNESIUM: MAGNESIUM: 1.7 mg/dL (ref 1.7–2.4)

## 2017-10-06 MED ORDER — LEVOTHYROXINE SODIUM 25 MCG PO TABS
325.0000 ug | ORAL_TABLET | Freq: Every day | ORAL | Status: DC
  Administered 2017-10-07: 325 ug via ORAL
  Filled 2017-10-06: qty 3

## 2017-10-06 MED ORDER — METOPROLOL TARTRATE 25 MG PO TABS
25.0000 mg | ORAL_TABLET | Freq: Four times a day (QID) | ORAL | Status: DC
  Administered 2017-10-06 – 2017-10-07 (×3): 25 mg via ORAL
  Filled 2017-10-06 (×2): qty 1

## 2017-10-06 MED ORDER — POTASSIUM CHLORIDE CRYS ER 20 MEQ PO TBCR
40.0000 meq | EXTENDED_RELEASE_TABLET | Freq: Two times a day (BID) | ORAL | Status: AC
  Administered 2017-10-06 (×2): 40 meq via ORAL
  Filled 2017-10-06 (×2): qty 2

## 2017-10-06 MED ORDER — MAGNESIUM SULFATE 2 GM/50ML IV SOLN
2.0000 g | Freq: Every day | INTRAVENOUS | Status: DC
  Administered 2017-10-06 – 2017-10-07 (×2): 2 g via INTRAVENOUS
  Filled 2017-10-06 (×2): qty 50

## 2017-10-06 NOTE — Consult Note (Signed)
Cardiology Consultation:   Patient ID: Mark Meadows; [id number]; 1951-01-30   Admit date: 10/02/2017 Date of Consult: 10/06/2017  Primary Care Provider: Elizabeth Palau, FNP Primary Cardiologist: None.  Chief Complaint: Alcohol Withdrawal  Patient Profile:   Mark Meadows is a 67 y.o. male with a hx of alcohol abuse, frequent UTI's, thrombocytopenia, ?dysrhythmia, HTN, hypothyroidism, GI bleed (2017), and vitamin B deficiency who is being seen today for the evaluation of atrial flutter at the request of Dr. Frances Furbish.   History of Present Illness:   Asbury Hair had 3 admissions in December 2017 for GI bleed, dehydration, and sepsis r/t UTI. He required SNF at discharge. He followed up with his PCP in March 2018 and had been sober for about 3 months at that time. Had started drinking again by June 2018 appointment after a friend had passed away and wasn't interested in treatment. He had stopped his HTN meds at that time, but was taking his Synthroid everyday. No further follow up since that time.  He presented to ED on 10/02/2017 with alcohol withdrawal, UTI, and coffee ground emesis. He was suspected to be in atrial flutter and was started on a diltiazem drip in the ED. This has since been converted to PO diltiazem. Pertinent admission labs include: hypokalemia 3.2, magnesium 1.6, albumin 3.7, AST 60, ALT 41, WBC 7.7, Hemoglobin 13.5, platelets 89, negative troponin, urine positive for E Coli, and abdominal US with multiple stones and sludge in the gallbladder with slight increased wall thickness and increased hepatic echogenicity consistent with steatosis or hepatocellular disease.  He denies any history of abnormal rhythm and says that nobody has spoken to him about blood thinners, so it is unclear whether he does actually have a history of atrial fib. During admission in Dec 2017, there was a question a fib but cardiology was asked to assess and it was decided that he was not in fact in atrial  fib (see EKG). No family history of heart disease. No SOB, dizziness, syncope, or presyncope. Prior to admission, he was drinking a fifth of liquor per day but is interested in getting sober. He had an episode of vague right sided CP prior to coming to the ED, but has not had any since. Presently, his HR has been reasonably controlled.   Past Medical History:  Diagnosis Date  . Acute blood loss anemia   . Alcohol dependence (HCC)   . Alcoholic hepatitis   . Dysrhythmia    "it doesn't move exactly the same; this is recent dx" (09/02/2016)  . E. coli UTI 09/22/2016  . GERD (gastroesophageal reflux disease)   . Gout   . Hematochezia   . Hyperglycemia   . Hypertension   . Hypokalemia   . Hypothyroidism   . Thrombocytopenia (HCC)     Past Surgical History:  Procedure Laterality Date  . COLONOSCOPY N/A 08/26/2016   Procedure: COLONOSCOPY;  Surgeon: Meryl Dare, MD;  Location: [facility] ENDOSCOPY;  Service: Endoscopy;  Laterality: N/A;  . TONSILLECTOMY  1957     Inpatient Medications: Scheduled Meds: . amLODipine  10 mg Oral Daily  . diltiazem  30 mg Oral Q6H  . folic acid  1 mg Oral Daily  . [START ON 10/07/2017] levothyroxine  325 mcg Oral QAC breakfast  . LORazepam  0-4 mg Intravenous Q12H  . pantoprazole  40 mg Oral Q1200  . potassium chloride  40 mEq Oral BID  . thiamine  100 mg Oral Daily  . vitamin B-12  1,000 mcg  Oral Daily   Continuous Infusions: . magnesium sulfate 1 - 4 g bolus IVPB Stopped (10/06/17 1132)   PRN Meds: acetaminophen **OR** acetaminophen, LORazepam **OR** LORazepam, magnesium hydroxide  Home Meds: Prior to Admission medications   Medication Sig Start Date End Date Taking? Authorizing Provider  acetaminophen (TYLENOL) 500 MG tablet Take 500 mg by mouth every 6 (six) hours as needed for mild pain.   Yes [provider]  levothyroxine (SYNTHROID, LEVOTHROID) 300 MCG tablet Take 1 tablet (300 mcg total) by mouth daily before breakfast. Patient taking  differently: Take 200 mcg by mouth daily before breakfast.  08/27/16  Yes Kathlen Mody, MD  omeprazole (PRILOSEC) 20 MG capsule Take 20 mg by mouth daily.   Yes [provider]  amLODipine (NORVASC) 10 MG tablet Take 1 tablet (10 mg total) by mouth daily. 08/27/16   Kathlen Mody, MD  folic acid (FOLVITE) 1 MG tablet Take 1 tablet (1 mg total) by mouth daily. Patient not taking: Reported on 10/02/2017 08/28/16   Kathlen Mody, MD  metoprolol tartrate (LOPRESSOR) 25 MG tablet Take 0.5 tablets (12.5 mg total) by mouth 2 (two) times daily. 08/27/16   Kathlen Mody, MD  pantoprazole (PROTONIX) 40 MG tablet Take 1 tablet (40 mg total) by mouth 2 (two) times daily. Patient not taking: Reported on 10/02/2017 08/27/16   Kathlen Mody, MD  tamsulosin (FLOMAX) 0.4 MG CAPS capsule Take 1 capsule (0.4 mg total) by mouth daily after supper. Patient not taking: Reported on 10/02/2017 09/23/16   Calvert Cantor, MD  thiamine 100 MG tablet Take 1 tablet (100 mg total) by mouth daily. Patient not taking: Reported on 10/02/2017 08/28/16   Kathlen Mody, MD    Allergies:   No Known Allergies  Social History:   Social History   Socioeconomic History  . Marital status: Widowed    Spouse name: Not on file  . Number of children: Not on file  . Years of education: Not on file  . Highest education level: Not on file  Social Needs  . Financial resource strain: Not on file  . Food insecurity - worry: Not on file  . Food insecurity - inability: Not on file  . Transportation needs - medical: Not on file  . Transportation needs - non-medical: Not on file  Occupational History  . Occupation: retired in 3/17  Tobacco Use  . Smoking status: Former Smoker    Packs/day: 3.00    Years: 12.00    Pack years: 36.00    Types: Cigarettes    Last attempt to quit: 1980    Years since quitting: 39.0  . Smokeless tobacco: Never Used  Substance and Sexual Activity  . Alcohol use: Yes    Alcohol/week: 48.0 oz    Types: 80  Shots of liquor per week    Comment: 09/02/2016 "was at 3 large bottles of cheap bourbon/week; cut down to 2 bottles"  . Drug use: Yes    Types: Marijuana    Comment: 09/02/2016 "tried marijuana 2 times; years ago"  . Sexual activity: Not Currently  Other Topics Concern  . Not on file  Social History Narrative  . Not on file    Family History:   The patient's family history includes Heart disease in his father; Thyroid disease in his mother.   ROS:  Please see the history of present illness.  All other ROS reviewed and negative.     Physical Exam/Data:   Vitals:   10/05/17 1645 10/06/17 0012 10/06/17 1610  10/06/17 1402  BP: 116/74 118/88 (!) 135/97 (!) 140/97  Pulse: 67 60 92 98  Resp: 18 18 18 18   Temp: 98.9 F (37.2 C) 100.2 F (37.9 C) 99.5 F (37.5 C) 99 F (37.2 C)  TempSrc: Oral Oral Oral Oral  SpO2: 96% 97% 99% 99%  Weight: 220 lb (99.8 kg)     Height: 5\' 10"  (1.778 m)       Intake/Output Summary (Last 24 hours) at 10/06/2017 1419 Last data filed at 10/06/2017 0442 Gross per 24 hour  Intake 355 ml  Output 400 ml  Net -45 ml   Filed Weights   10/03/17 0445 10/05/17 1645  Weight: 212 lb 8.4 oz (96.4 kg) 220 lb (99.8 kg)   Body mass index is 31.57 kg/m.  General: in no acute distress. Head: Normocephalic, atraumatic, sclera non-icteric, no xanthomas, nares are without discharge. Exotropia Neck: Negative for carotid bruits. JVD not elevated. Lungs: Clear, diminished bilaterally to auscultation without wheezes, rales, or rhonchi. Breathing is unlabored.  Heart: irregular with S1 S2. No murmurs, rubs, or gallops appreciated. Abdomen: obese, Soft, non-tender, non-distended with normoactive bowel sounds. No hepatomegaly. No rebound/guarding. No obvious abdominal masses. Msk:  Strength and tone appear normal for age. Extremities: No clubbing or cyanosis. No edema.  Distal pedal pulses are 2+ and equal bilaterally. Neuro: Alert and oriented X 3. No facial  asymmetry. No focal deficit. Moves all extremities spontaneously. Psych:  Responds to questions appropriately with a normal affect.  EKG:  multiple EKGs personally reviewed and demonstrate: narrow complex tachycardia, HR 130's, ?a-flutter vs ectopic atrial tach with prolonged QT interval, no acute ST changes. --> Telemetry during slower rates appears to confirm atrial flutter - faster episodes are more irregular & probably consistent with Atrial Fibrillation.  Relevant CV Studies: Echo pending  Laboratory Data:  Chemistry Recent Labs  Lab 10/04/17 0229 10/05/17 0819 10/06/17 0857  NA 138 136 134*  K 2.8* 3.2* 3.0*  CL 101 103 102  CO2 22 22 21*  GLUCOSE 105* 124* 117*  BUN 7 6 12   CREATININE 0.96 1.01 1.11  CALCIUM 8.8* 9.0 8.9  GFRNONAA >60 >60 >60  GFRAA >60 >60 >60  ANIONGAP 15 11 11     Recent Labs  Lab 10/04/17 0229 10/05/17 0819 10/06/17 0857  PROT 6.9 7.3 7.0  ALBUMIN 3.5 3.3* 3.3*  AST 54* 85* 113*  ALT 35 47 69*  ALKPHOS 69 69 69  BILITOT 2.9* 2.2* 2.5*   Hematology Recent Labs  Lab 10/04/17 0229 10/05/17 0819 10/06/17 0857  WBC 8.2 9.2 9.6  RBC 3.46* 3.54* 3.47*  HGB 12.9* 13.3 12.8*  HCT 36.7* 37.3* 37.1*  MCV 106.1* 105.4* 106.9*  MCH 37.3* 37.6* 36.9*  MCHC 35.1 35.7 34.5  RDW 14.8 14.9 15.2  PLT 79* 79* 83*   Cardiac EnzymesNo results for input(s): TROPONINI in the last 168 hours.  Recent Labs  Lab 10/02/17 0904  TROPIPOC 0.00    BNPNo results for input(s): BNP, PROBNP in the last 168 hours.  DDimer No results for input(s): DDIMER in the last 168 hours.  Radiology/Studies:  US Abdomen Complete  Result Date: 10/05/2017 CLINICAL DATA:  Thrombocytopenia, alcoholic liver disease EXAM: ABDOMEN ULTRASOUND COMPLETE COMPARISON:  Ultrasound 08/23/2016 FINDINGS: Gallbladder: Multiple stones and sludge in the gallbladder. Negative sonographic Murphy's. Stones measure up to 1.9 cm. Slight increased wall thickness at 4.3 mm Common bile duct:  Diameter: 3.6 mm Liver: Slight increase in size, measuring 17.8 cm. Increased hepatic echogenicity portal vein is  patent on color Doppler imaging with normal direction of blood flow towards the liver. IVC: No abnormality visualized. Pancreas: Poorly visible due to gas Spleen: Size and appearance within normal limits. Right Kidney: Length: 11.5 cm. Echogenicity within normal limits. No mass or hydronephrosis visualized. Punctate echogenic foci in the lower pole and midpole could reflect small stones or vascular calcification. Left Kidney: Length: 11.3 cm. Echogenicity within normal limits. No mass or hydronephrosis visualized. Abdominal aorta: No aneurysm visualized. Other findings: No ascites IMPRESSION: 1. Multiple stones and sludge in the gallbladder with slight increased wall thickness. This is a nonspecific finding and can be seen in the setting of liver disease, or acute or chronic cholecystitis 2. Increased hepatic echogenicity consistent with steatosis or hepatocellular disease Electronically Signed   By: Jasmine PangKim  Fujinaga M.D.   On: 10/05/2017 23:43    Assessment and Plan:   1. A-flutter vs A fib - On admission, appeared to be in 2:1 flutter. Now with variable conduction, HR 80's. In reviewing previous notes, it does not appear that he has had this before, although EKG from 2017 is difficult to interpret. With extensive history of alcohol abuse, thrombocytopenia, prior GI bleeding, and coffee ground emesis this admission, he would not be a good candidate for anticoagulants. His rate is better controlled than on admission (130-140's), but he has been treated for ETOH withdrawal and UTI. He is currently on two calcium channel blockers: amlodipine 10 mg daily and diltiazem 30 mg q6 hours. Consider changing amlodipine to a beta blocker or uptitrating diltizem. He is not on aspirin. Echo is pending. Assuming LV function is normal, CHADSVASC is 2. As we are not initially planning to anticoagulate, our focus will  be on rate control. If he remains abstinent of alcohol with follow up, we can revisit anticoagulation.   2. HTN - SBP 110-140's. As above, he is on two calcium channel blockers and may benefit from a different AV nodal agent, i.e. beta-blocker. --Convert p.o. diltiazem to metoprolol..  3. Alcohol Withdrawal - He is now alert and oriented. Per chart review, he is interested in getting sober and social work is helping with recommendations for resources. He is on folic acid, thiamine, and B-12 supplements. He would need to be able to prove that he can be sober for some time before considering anticoagulants to reduce risk of stroke with atrial flutter. Last dose of ativan was 1/13.   4. Upper GI bleed - He had some coffee ground emesis in ED. Hemoglobin has been stable. Continue protonix per primary.  5. Hypothyroidism - TSH elevated on admission to 8.97. ? Noncompliance. Continue synthroid per primary team.  6. Frequent UTI's  - Completed course of IV Rocephin. Resolved. WBC 9.6 today. Temp of 100.2 overnight.  7. Thrombocytopenia  - Likely related to longstanding alcohol abuse. Monitor daily CBC. This is another concern when thinking about anticoagulants in this patient.   8. Prolonged QT  - may be in the setting of alcohol and electrolyte disturbance. Avoid QT-prolonging agents. Monitor daily BMP. Will recheck EKG now that his HR is better controlled.   For questions or updates, please contact [facility] Please consult [email or web address] for contact info under Cardiology/STEMI.    Signed, Alford HighlandAshley M Smith, NP  10/06/2017 2:19 PM   I have seen, examined and evaluated the patient this PM along with Duwaine MaxinAshley Smith, NP & Ronie Spiesayna Dunn, PA.  After reviewing all the available data and chart, we discussed the patients laboratory, study & physical findings as well  as symptoms in detail. I agree with their findings, examination as well as impression recommendations as per our discussion.    Attending  adjustments noted in italics.   We are asked to see the patient for an arrhythmia that is found in the setting of acute alcohol withdrawal.  Clearly the catecholamine anergic drive during alcohol withdrawal can drive rapid A. Fib.  IV diltiazem is a good choice initially for rate control, however the use of 2 calcium channel blockers is probably not the best plan.  Amlodipine is being used for blood pressure.  Especially in the setting of alcohol withdrawal, beta-blocker is probably a better choice for rate control.  Would convert the p.o. diltiazem to p.o. metoprolol.  The next question revolves around considering anticoagulation.  Several features here that would make him less than outstanding candidate for chronic anti-correlation.  1 of which is concern for possible nonadherence making it unreliable.  He has had coffee-ground emesis during this hospitalization, making potential gastritis and even potentially varicosities a possibility.  He has thrombocytopenia with long-standing alcohol abuse, cannot exclude some cirrhotic changes.  At this point, I think my preference would be to monitor his hemoglobin levels, his platelet levels and his overall compliance with medications prior to discussing anticoagulation in the outpatient setting.   Cardiology team will monitor his rate control during his hospitalization and arrange follow-up (likely in the A. fib clinic) on discharge.   Bryan Lemma, M.D., M.S. Interventional Cardiologist   Pager # [phone] Phone # [phone] [address] [place], [place] [address]

## 2017-10-06 NOTE — Progress Notes (Signed)
Family Medicine Teaching Service Daily Progress Note Intern Pager: [phone]  Patient name: Mark Meadows Medical record number: [id number] Date of birth: [birth year] Age: 67 y.o. Gender: male  Primary Care Provider: Vicenta Aly, [place] Consultants: None Code Status: DNR (confirmed on admission)  Pt Overview and Major Events to Date:  01/11: Admit for EtOH withdrawal, found to have UTI  Assessment and Plan: Mark Meadows is a 67 y.o. male presenting with alcohol withdrawal, atrial flutter vs fibrillation, and UTI. PMH is significant for chronic alcoholism, frequent UTIs, atrial fibrillation, HTN, hypothyroidism, Vitamin B deficiency.  Alcohol withdrawal with alcoholic ketoacidosis: CIWA scores as high as 23 in the ED and intermittently elevated since admission, but low overnight with scores of 3 and 2 on 1/14 AM with Librium 61m q4hrs and ativan 152mq4hrs PRN.  Patient remains without hallucinations and is noncombative.  Hypokalemic to 3.0 on 1/15. Magnesium 1.7.  Tachycardia has resolved.  Good p.o. Intake.  Last PRN Ativan was 1/13 AM.  PT recommended SNF. - CIWA protocol with Ativan as needed, currently q4hrs PRN - Aspiration precautions  - Protonix 40 mg IV daily - Social work consulted, appreciate recommendations - need recs for alcohol rehab, outpatient resources such as AA locations - check daily CMP, Mag - replete MgSO4 2 g IV x 5 days - replete K with 40 mEq KDur BID - check folate, ammonia - vitamin B12 oral 100077mdaily - [facility] in 4-6 weeks  Upper GI bleed, stable:  Patient with coffee-ground emesis in ED. Nausea now resolved.  Likely due to frequent alcohol use, alcoholic cirrhosis, and increase abdominal pressure with vomiting.  Initial hemoglobin WNL.  Repeat hgb stable 1/14.   - follow CBC - Plan per above for alcohol withdrawal - Consider GI consult if bleeding persists or patient becomes unstable  Atrial flutter vs fibrillation: currently  resolved with last tachycardic episode 1/13 AM.  Patient with irregular heart rhythm on auscultation 1/15.  Likely 2/2 alcohol withdrawal. Denies CP, SOB.  - cardiology consult - Telemetry - continue PO cardizem 30 mg Q6H - Plan per above for alcohol withdrawal - consider anticoagulation due to CHADS VaSc of 2 (patient adherence unlikely) - follow up on TTE  Hypertension, stable:  BP 135/97 on 1/15.  Exacerbated likely due to alcohol withdrawal and nonadherence to antihypertensives. - Discontinue Cardizem drip and continue home amlodipine 10 mg daily - Plan per above for alcohol withdrawal - discontinue labetalol   Hypothyroidism: Chronic.  Uncontrolled.  TSH elevated at 8.97 on admission.  Currently on Synthroid.  Doubt adherence given alcoholism and active withdrawal. - Increase home Synthroid to 325 mcg daily  FEN/GI: ICV protonix, heart healthy diet PPx: SCDs (holding anticoagulation given bloody emesis)  Disposition: Pending EtOH withdrawal management and atrial flutter vs fibrillation management.  Patient is not amenable to SNF placement and would like to go home.  Subjective:  Patient continues to feel well and denies any auditory or visual hallucinations, abdominal pain, nausea, CP or SOB.  He says that AA has not appealed to him in the past due to its religious component.  He is interested in being referred to a psychiatrist, which was a suggestion from his PCP in the past.  Objective: Temp:  [98.9 F (37.2 C)-100.2 F (37.9 C)] 99.5 F (37.5 C) (01/15 0527) Pulse Rate:  [60-92] 92 (01/15 0527) Resp:  [16-18] 18 (01/15 0527) BP: (116-136)/(74-99) 135/97 (01/15 0527) SpO2:  [96 %-100 %] 99 % (01/15 0527) Weight:  [  220 lb (99.8 kg)] 220 lb (99.8 kg) (01/14 1645) Physical Exam: General: 67 year old male sitting up in bed appearing comfortable and in no acute distress HEENT: Moist mucous membranes Cardiovascular: normal rate, irregularly irregular rhythm, no apparent  murmurs Lungs: Normal work of breathing, but no wheezing or rhonchi Abdomen: soft, non-tender, non-distended, normoactive bowel sounds Extremities: warm and well perfused, normal tone, no edema Psych: Pleasant, tangential speech, but appropriate Neuro: alert and oriented x 3, thought pattern more organized today than yesterday  Laboratory: Recent Labs  Lab 10/03/17 0601 10/04/17 0229 10/05/17 0819  WBC 7.7 8.2 9.2  HGB 13.5 12.9* 13.3  HCT 39.0 36.7* 37.3*  PLT 89* 79* 79*   Recent Labs  Lab 10/03/17 0601 10/04/17 0229 10/05/17 0819  NA 138 138 136  K 3.2* 2.8* 3.2*  CL 99* 101 103  CO2 27 22 22   BUN 9 7 6   CREATININE 0.96 0.96 1.01  CALCIUM 9.1 8.8* 9.0  PROT 7.4 6.9 7.3  BILITOT 3.8* 2.9* 2.2*  ALKPHOS 76 69 69  ALT 41 35 47  AST 60* 54* 85*  GLUCOSE 126* 105* 124*   Hepatic Function Latest Ref Rng & Units 10/05/2017 10/04/2017 10/03/2017  Total Protein 6.5 - 8.1 g/dL 7.3 6.9 7.4  Albumin 3.5 - 5.0 g/dL 3.3(L) 3.5 3.7  AST 15 - 41 U/L 85(H) 54(H) 60(H)  ALT 17 - 63 U/L 47 35 41  Alk Phosphatase 38 - 126 U/L 69 69 76  Total Bilirubin 0.3 - 1.2 mg/dL 2.2(H) 2.9(H) 3.8(H)  Bilirubin, Direct 0.1 - 0.5 mg/dL - - -   Urinalysis    Component Value Date/Time   COLORURINE YELLOW 10/02/2017 1250   APPEARANCEUR HAZY (A) 10/02/2017 1250   LABSPEC >1.030 (H) 10/02/2017 1250   PHURINE 5.5 10/02/2017 1250   GLUCOSEU NEGATIVE 10/02/2017 1250   HGBUR MODERATE (A) 10/02/2017 1250   BILIRUBINUR NEGATIVE 10/02/2017 1250   KETONESUR 40 (A) 10/02/2017 1250   PROTEINUR >300 (A) 10/02/2017 1250   NITRITE POSITIVE (A) 10/02/2017 1250   LEUKOCYTESUR SMALL (A) 10/02/2017 1250   I-STAT troponin: 0.00 TSH: 8.970 (elevated) INR: 1.00 EtOH: 1.64 (elevated) Occult blood gastric/duodenum: Positive, pH 4 Magnesium: 1.6 (low) Phosphorus: 2.5 (WNL) Lipase: 28 (WNL) Folate: Pending B12: 471 (WNL)  Imaging/Diagnostic Tests: DG CHEST 2 VIEW (10/02/17):  No acute cardiopulmonary  abnormalities. Large hiatal hernia.    [name], MD 10/06/2017, 7:24 AM PGY-1, [place] Intern pager: [phone], text pages welcome

## 2017-10-06 NOTE — Care Management Important Message (Signed)
Important Message  Patient Details  Name: Mark BaliJeffrey Altamirano MRN: [id number] Date of Birth: 1951-03-06   Medicare Important Message Given:  Yes    Moataz Tavis 10/06/2017, 1:12 PM

## 2017-10-06 NOTE — Progress Notes (Signed)
1717- 12 lead EKG completed. In patient's paper chart.

## 2017-10-06 NOTE — Social Work (Addendum)
CSW acknowledging consult, CSW stopped by room to speak with pt, unable to assess as pt was at procedure.   Will attempt assessment later today or tomorrow. CSW continuing to follow.   Doy HutchingIsabel H Vayla Wilhelmi, LCSWA [facility] Clinical Social Work [phone]

## 2017-10-06 NOTE — Progress Notes (Signed)
  Echocardiogram 2D Echocardiogram has been performed.  Mark Meadows, Mark Meadows F 10/06/2017, 3:08 PM

## 2017-10-06 NOTE — Evaluation (Signed)
Occupational Therapy Evaluation Patient Details Name: Mark Meadows MRN: [id number] DOB: [birth year] Today's Date: 10/06/2017    History of Present Illness Pt adm with alcohol withdrawal, atrial flutter vs fibrillation, and UTI. PMH - ETOH, gout, afib, htn, vitamin B deficiency   Clinical Impression   PTA, pt reports independence with ADL and functional mobility and living alone with his dog. Pt currently requires mod assist for LB bathing and dressing tasks; mod assist for toilet transfers with RW, and max assist for toileting hygiene. He presents with L knee pain, decreased balance, generalized weakness, and decreased activity tolerance for ADL significantly limiting his ability to participate. He was oriented to person, place, and situation today but remains with decreased awareness and attention with poor understanding of his functional limitations. At current functional level, recommend SNF level rehabilitation post-acute D/C to maximize return to independence prior to D/C home as pt does not have 24 hour assistance available. Will continue to follow while admitted.     Follow Up Recommendations  SNF;Supervision/Assistance - 24 hour    Equipment Recommendations  3 in 1 bedside commode;Tub/shower seat    Recommendations for Other Services       Precautions / Restrictions Precautions Precautions: Fall Restrictions Weight Bearing Restrictions: No      Mobility Bed Mobility Overal bed mobility: Needs Assistance Bed Mobility: Supine to Sit;Sit to Supine     Supine to sit: Supervision;HOB elevated Sit to supine: Min guard   General bed mobility comments: VC's and significantly increased time secondary to knee pain  Transfers Overall transfer level: Needs assistance Equipment used: Rolling walker (2 wheeled) Transfers: Sit to/from Stand Sit to Stand: Mod assist         General transfer comment: Assist to power up and maintain balance. Pt attributes this to knee pain     Balance Overall balance assessment: Needs assistance Sitting-balance support: No upper extremity supported;Feet supported Sitting balance-Leahy Scale: Fair     Standing balance support: Bilateral upper extremity supported Standing balance-Leahy Scale: Poor Standing balance comment: Relies on assistance and RW.                            ADL either performed or assessed with clinical judgement   ADL Overall ADL's : Needs assistance/impaired Eating/Feeding: Set up;Sitting   Grooming: Minimal assistance;Standing   Upper Body Bathing: Set up;Sitting   Lower Body Bathing: Moderate assistance;Sit to/from stand   Upper Body Dressing : Set up;Sitting   Lower Body Dressing: Moderate assistance;Sit to/from stand   Toilet Transfer: Moderate assistance;Ambulation;RW Toilet Transfer Details (indicate cue type and reason): Simulated with sit<>stand followed by functional mobility in room.  Toileting- Clothing Manipulation and Hygiene: Maximal assistance;Sit to/from stand       Functional mobility during ADLs: Moderate assistance;Rolling walker General ADL Comments: Assist for balance and safety. VC's for safe use of RW. Pt cognition is improving but pt continues to have decreased awareness and understanding of deficits.      Vision Baseline Vision/History: Wears glasses Wears Glasses: At all times(except reading) Patient Visual Report: No change from baseline Vision Assessment?: Yes Eye Alignment: Impaired (comment)(disconjugate gaze with R eye laterally rotated) Additional Comments: Pt reports vision is baseline. Need to further assess as noted disconjugate gaze.      Perception     Praxis      Pertinent Vitals/Pain Pain Assessment: 0-10 Pain Location: L knee Pain Descriptors / Indicators: Aching;Sore     Hand Dominance  Extremity/Trunk Assessment Upper Extremity Assessment Upper Extremity Assessment: Generalized weakness   Lower Extremity  Assessment Lower Extremity Assessment: Generalized weakness;LLE deficits/detail LLE Deficits / Details: L knee edema and pain. Decreased ability to bear weight.        Communication Communication Communication: No difficulties   Cognition Arousal/Alertness: Awake/alert Behavior During Therapy: WFL for tasks assessed/performed Overall Cognitive Status: Impaired/Different from baseline Area of Impairment: Attention;Memory;Safety/judgement;Following commands;Problem solving;Awareness                   Current Attention Level: Sustained Memory: Decreased recall of precautions;Decreased short-term memory Following Commands: Follows multi-step commands inconsistently Safety/Judgement: Decreased awareness of safety;Decreased awareness of deficits Awareness: Emergent Problem Solving: Slow processing;Requires verbal cues;Requires tactile cues;Difficulty sequencing General Comments: Pt demonstrating decreased short term memory skills this session.    General Comments       Exercises     Shoulder Instructions      Home Living Family/patient expects to be discharged to:: Private residence Living Arrangements: Alone   Type of Home: House Home Access: Stairs to enter Entergy CorporationEntrance Stairs-Number of Steps: 4 Entrance Stairs-Rails: Right;Left;Can reach both Home Layout: One level     Bathroom Shower/Tub: Chief Strategy OfficerTub/shower unit   Bathroom Toilet: Standard     Home Equipment: Environmental consultantWalker - 2 wheels;Cane - single point;Grab bars - tub/shower;Wheelchair - manual          Prior Functioning/Environment Level of Independence: Independent        Comments: Had not been using cane or walker        OT Problem List: Decreased strength;Decreased range of motion;Decreased activity tolerance;Decreased safety awareness;Decreased knowledge of use of DME or AE;Decreased knowledge of precautions;Decreased cognition;Impaired vision/perception;Impaired balance (sitting and/or standing);Pain      OT  Treatment/Interventions: Self-care/ADL training;Therapeutic exercise;Energy conservation;DME and/or AE instruction;Therapeutic activities;Patient/family education;Balance training;Cognitive remediation/compensation;Visual/perceptual remediation/compensation    OT Goals(Current goals can be found in the care plan section) Acute Rehab OT Goals Patient Stated Goal: go home OT Goal Formulation: With patient Time For Goal Achievement: 10/20/17 Potential to Achieve Goals: Good ADL Goals Pt Will Perform Grooming: with supervision;standing Pt Will Perform Lower Body Dressing: with supervision;sit to/from stand Pt Will Transfer to Toilet: with supervision;ambulating;bedside commode(BSC over toilet) Pt Will Perform Toileting - Clothing Manipulation and hygiene: with supervision;sit to/from stand Pt Will Perform Tub/Shower Transfer: Tub transfer;with supervision;rolling walker;3 in 1;ambulating Additional ADL Goal #1: Pt will demonstrate anticipatory awareness during morning grooming tasks in a minimally distracting environment.  OT Frequency: Min 2X/week   Barriers to D/C:            Co-evaluation              AM-PAC PT "6 Clicks" Daily Activity     Outcome Measure Help from another person eating meals?: A Little Help from another person taking care of personal grooming?: A Little Help from another person toileting, which includes using toliet, bedpan, or urinal?: A Lot Help from another person bathing (including washing, rinsing, drying)?: A Lot Help from another person to put on and taking off regular upper body clothing?: A Little Help from another person to put on and taking off regular lower body clothing?: A Lot 6 Click Score: 15   End of Session Equipment Utilized During Treatment: Gait belt;Rolling walker Nurse Communication: Mobility status(NT: condom cath is off)  Activity Tolerance: Patient tolerated treatment well Patient left: in chair;with call bell/phone within  reach;with chair alarm set  OT Visit Diagnosis: Other abnormalities of gait and mobility (R26.89);Pain Pain -  Right/Left: Left Pain - part of body: Knee                Time: 0922-0959 OT Time Calculation (min): 37 min Charges:  OT General Charges $OT Visit: 1 Visit OT Evaluation $OT Eval Moderate Complexity: 1 Mod OT Treatments $Self Care/Home Management : 8-22 mins G-Codes:     Doristine Section, MS OTR/L  Pager: [phone]   Kimbley Sprague A Faryn Sieg 10/06/2017, 11:07 AM

## 2017-10-06 NOTE — Progress Notes (Signed)
1700 Notified MD to contact [name]'s brother ([phone]- [phone]). Chip called to inquire about Mark Meadows's current health status. Chip states he is health care power of attorney.

## 2017-10-07 LAB — BASIC METABOLIC PANEL
Anion gap: 10 (ref 5–15)
BUN: 15 mg/dL (ref 6–20)
CHLORIDE: 104 mmol/L (ref 101–111)
CO2: 21 mmol/L — ABNORMAL LOW (ref 22–32)
CREATININE: 1.16 mg/dL (ref 0.61–1.24)
Calcium: 9 mg/dL (ref 8.9–10.3)
GFR calc Af Amer: >60 mL/min (ref >60)
GFR calc non Af Amer: >60 mL/min (ref >60)
Glucose, Bld: 109 mg/dL — ABNORMAL HIGH (ref 65–99)
Potassium: 3.9 mmol/L (ref 3.5–5.1)
SODIUM: 135 mmol/L (ref 135–145)

## 2017-10-07 LAB — CBC
HCT: 35.3 % — ABNORMAL LOW (ref 39.0–52.0)
HEMOGLOBIN: 12.2 g/dL — AB (ref 13.0–17.0)
MCH: 37.3 pg — ABNORMAL HIGH (ref 26.0–34.0)
MCHC: 34.6 g/dL (ref 30.0–36.0)
MCV: 108 fL — ABNORMAL HIGH (ref 78.0–100.0)
PLATELETS: 93 10*3/uL — AB (ref 150–400)
RBC: 3.27 MIL/uL — ABNORMAL LOW (ref 4.22–5.81)
RDW: 15.2 % (ref 11.5–15.5)
WBC: 9.5 10*3/uL (ref 4.0–10.5)

## 2017-10-07 LAB — MAGNESIUM: MAGNESIUM: 1.7 mg/dL (ref 1.7–2.4)

## 2017-10-07 MED ORDER — METOPROLOL SUCCINATE ER 100 MG PO TB24: 100.0000 mg | ORAL_TABLET | Freq: Every day | ORAL | Status: DC

## 2017-10-07 MED ORDER — METOPROLOL SUCCINATE ER 100 MG PO TB24
100.0000 mg | ORAL_TABLET | Freq: Every day | ORAL | 0 refills | Status: DC
Start: 2017-10-08 — End: 2024-02-16

## 2017-10-07 MED ORDER — LEVOTHYROXINE SODIUM 300 MCG PO TABS: 300.0000 ug | ORAL_TABLET | Freq: Every day | ORAL | 0 refills | Status: DC

## 2017-10-07 MED ORDER — METOPROLOL SUCCINATE ER 50 MG PO TB24
50.0000 mg | ORAL_TABLET | Freq: Every day | ORAL | Status: AC
  Administered 2017-10-07: 50 mg via ORAL
  Filled 2017-10-07: qty 1

## 2017-10-07 MED ORDER — CYANOCOBALAMIN 1000 MCG PO TABS: 1000.0000 ug | ORAL_TABLET | Freq: Every day | ORAL | 0 refills | Status: DC

## 2017-10-07 MED ORDER — METOPROLOL SUCCINATE ER 50 MG PO TB24
50.0000 mg | ORAL_TABLET | Freq: Every day | ORAL | Status: DC
  Administered 2017-10-07: 50 mg via ORAL
  Filled 2017-10-07: qty 1

## 2017-10-07 NOTE — Progress Notes (Signed)
Family Medicine Teaching Service Daily Progress Note Intern Pager: [phone]  Patient name: Mark Meadows Medical record number: [id number] Date of birth: [birth year] Age: 67 y.o. Gender: male  Primary Care Provider: Vicenta Aly, [place] Consultants: None Code Status: DNR (confirmed on admission)  Pt Overview and Major Events to Date:  01/11: Admit for EtOH withdrawal, found to have UTI  Assessment and Plan: Leverne Meadows is a 67 y.o. male presenting with alcohol withdrawal, atrial flutter vs fibrillation, and UTI. PMH is significant for chronic alcoholism, frequent UTIs, atrial fibrillation, HTN, hypothyroidism, Vitamin B deficiency.  Alcohol withdrawal with alcoholic ketoacidosis: CIWA scores as high as 23 in the ED and intermittently elevated since admission, but overnight scores of 0.  Patient remains without hallucinations and is noncombative.  Hypokalemic to 3.9 on 1/16. Magnesium 1.7.  Tachycardia has resolved.  Good p.o. Intake.  Last PRN Ativan was 1/13 AM.  PT and OT recommend SNF, but patient is not amenable to this. - CIWA protocol with Ativan as needed, currently q4hrs PRN - Aspiration precautions  - Protonix 40 mg PO daily - Social work consulted, appreciate recommendations - need recs for alcohol rehab, outpatient resources such as AA locations - check daily CMP, Mag - replete MgSO4 2 g IV x 5 days - check folate, ammonia - vitamin B12 oral 1057mg daily [facility]  Upper GI bleed, stable:  Patient with coffee-ground emesis in ED. Nausea now resolved.  Likely due to frequent alcohol use, alcoholic cirrhosis, and increase abdominal pressure with vomiting.  Initial hemoglobin WNL.  Repeat hgb stable 1/14.   - follow CBC - Plan per above for alcohol withdrawal - Consider GI consult if bleeding persists or patient becomes unstable  Atrial flutter vs fibrillation: currently resolved with last tachycardic episode 1/13 AM.  Patient with  irregular heart rhythm on auscultation 1/15.  Likely 2/2 alcohol withdrawal. Denies CP, SOB.  TTE with EF 55%, LV hypertrophy, biatrial enlargement, but normal biventricular size. - appreciate cardiac recommendations - changed PO diltiazem to metoprolol, will not pursue anticoagulation due to patient's several bleeding risk factors - metoprolol succinate 50 mg daily - Telemetry - Plan per above for alcohol withdrawal  Hypertension, stable:  BP 110/71 on 1/16.  Exacerbated likely due to alcohol withdrawal and nonadherence to antihypertensives. - continue home amlodipine 10 mg daily - Plan per above for alcohol withdrawal - discontinue labetalol   Hypothyroidism: Chronic.  Uncontrolled.  TSH elevated at 8.97 on admission.  Currently on Synthroid.  Doubt adherence given alcoholism and active withdrawal. - Increase home Synthroid to 325 mcg daily  FEN/GI: ICV protonix, heart healthy diet PPx: SCDs (holding anticoagulation given bloody emesis)  Disposition: Pending EtOH withdrawal management and atrial flutter vs fibrillation management.  Patient is not amenable to SNF placement and would like to go home.  Subjective:  Patient continues to feel well and denies any auditory or visual hallucinations, abdominal pain, nausea, CP or SOB.  He says he has not taken his medications except for his synthroid because he doesn't like doctors and "doesn't want to pay for their boats."  Patient was counseled on taking his newly prescribed metoprolol as well as his amlodipine and synthroid and that these medications will help him avoid admissions in the future, especially if he is able to abstain from alcohol.  Objective: Temp:  [97.6 F (36.4 C)-99.1 F (37.3 C)] 99.1 F (37.3 C) (01/16 0514) Pulse Rate:  [66-98] 66 (01/16 0514) Resp:  [18-19]  18 (01/16 0514) BP: (104-140)/(71-97) 110/71 (01/16 0514) SpO2:  [95 %-99 %] 98 % (01/16 0514) Physical Exam: General: 67 year old male sitting up in bed  appearing comfortable and in no acute distress HEENT: Moist mucous membranes Cardiovascular: normal rate, irregularly irregular rhythm, no apparent murmurs Lungs: Normal work of breathing, but no wheezing or rhonchi Abdomen: soft, non-tender, non-distended, normoactive bowel sounds Extremities: warm and well perfused, normal tone, no edema Psych: Pleasant and appropriate Neuro: alert and oriented x 3, thought pattern much closer to normal today  Laboratory: Recent Labs  Lab 10/05/17 0819 10/06/17 0857 10/07/17 0407  WBC 9.2 9.6 9.5  HGB 13.3 12.8* 12.2*  HCT 37.3* 37.1* 35.3*  PLT 79* 83* 93*   Recent Labs  Lab 10/04/17 0229 10/05/17 0819 10/06/17 0857 10/07/17 0407  NA 138 136 134* 135  K 2.8* 3.2* 3.0* 3.9  CL 101 103 102 104  CO2 22 22 21* 21*  BUN 7 6 12 15   CREATININE 0.96 1.01 1.11 1.16  CALCIUM 8.8* 9.0 8.9 9.0  PROT 6.9 7.3 7.0  --   BILITOT 2.9* 2.2* 2.5*  --   ALKPHOS 69 69 69  --   ALT 35 47 69*  --   AST 54* 85* 113*  --   GLUCOSE 105* 124* 117* 109*   Hepatic Function Latest Ref Rng & Units 10/06/2017 10/05/2017 10/04/2017  Total Protein 6.5 - 8.1 g/dL 7.0 7.3 6.9  Albumin 3.5 - 5.0 g/dL 3.3(L) 3.3(L) 3.5  AST 15 - 41 U/L 113(H) 85(H) 54(H)  ALT 17 - 63 U/L 69(H) 47 35  Alk Phosphatase 38 - 126 U/L 69 69 69  Total Bilirubin 0.3 - 1.2 mg/dL 2.5(H) 2.2(H) 2.9(H)  Bilirubin, Direct 0.1 - 0.5 mg/dL - - -   Urinalysis    Component Value Date/Time   COLORURINE YELLOW 10/02/2017 1250   APPEARANCEUR HAZY (A) 10/02/2017 1250   LABSPEC >1.030 (H) 10/02/2017 1250   PHURINE 5.5 10/02/2017 1250   GLUCOSEU NEGATIVE 10/02/2017 1250   HGBUR MODERATE (A) 10/02/2017 1250   BILIRUBINUR NEGATIVE 10/02/2017 1250   KETONESUR 40 (A) 10/02/2017 1250   PROTEINUR >300 (A) 10/02/2017 1250   NITRITE POSITIVE (A) 10/02/2017 1250   LEUKOCYTESUR SMALL (A) 10/02/2017 1250   I-STAT troponin: 0.00 TSH: 8.970 (elevated) INR: 1.00 EtOH: 1.64 (elevated) Occult blood  gastric/duodenum: Positive, pH 4 Magnesium: 1.6 (low) Phosphorus: 2.5 (WNL) Lipase: 28 (WNL) Folate: Pending B12: 471 (WNL)  Imaging/Diagnostic Tests: DG CHEST 2 VIEW (10/02/17):  No acute cardiopulmonary abnormalities. Large hiatal hernia.    [name], MD 10/07/2017, 7:31 AM PGY-1, [place] Intern pager: [phone], text pages welcome

## 2017-10-07 NOTE — Progress Notes (Signed)
Discharge home. Home discharge instruction given, no question verbalized.HHn addressed by the CM and SW.

## 2017-10-07 NOTE — Discharge Instructions (Signed)
Thank you for allowing [place] to participate in your care!  We are adding some medications to what you've been taking at home.  These are for your health and will help you stay out of the hospital in the future.  One medication is called metoprolol.  You will take this once per day.  It will keep your heart from beating too fast.  We are also prescribing Vitamin B12, which is important for your brain and nerves.  You have a deficiency in this vitamin due to your alcohol use.  Please make an appointment with our [facility] to get a neurological assessment by calling [phone].  Please also see your PCP, Ms. Anderson, in the next week.  Alcohol Use Disorder Alcohol use disorder is when your drinking disrupts your daily life. When you have this condition, you drink too much alcohol and you cannot control your drinking. Alcohol use disorder can cause serious problems with your physical health. It can affect your brain, heart, liver, pancreas, immune system, stomach, and intestines. Alcohol use disorder can increase your risk for certain cancers and cause problems with your mental health, such as depression, anxiety, psychosis, delirium, and dementia. People with this disorder risk hurting themselves and others. What are the causes? This condition is caused by drinking too much alcohol over time. It is not caused by drinking too much alcohol only one or two times. Some people with this condition drink alcohol to cope with or escape from negative life events. Others drink to relieve pain or symptoms of mental illness. What increases the risk? You are more likely to develop this condition if:  You have a family history of alcohol use disorder.  Your culture encourages drinking to the point of intoxication, or makes alcohol easy to get.  You had a mood or conduct disorder in childhood.  You have been a victim of abuse.  You are an adolescent and: ? You have poor grades or difficulties in  school. ? Your caregivers do not talk to you about saying no to alcohol, or supervise your activities. ? You are impulsive or you have trouble with self-control.  What are the signs or symptoms? Symptoms of this condition include:  Drinkingmore than you want to.  Drinking for longer than you want to.  Trying several times to drink less or to control your drinking.  Spending a lot of time getting alcohol, drinking, or recovering from drinking.  Craving alcohol.  Having problems at work, at school, or at home due to drinking.  Having problems in relationships due to drinking.  Drinking when it is dangerous to drink, such as before driving a car.  Continuing to drink even though you know you might have a physical or mental problem related to drinking.  Needing more and more alcohol to get the same effect you want from the alcohol (building up tolerance).  Having symptoms of withdrawal when you stop drinking. Symptoms of withdrawal include: ? Fatigue. ? Nightmares. ? Trouble sleeping. ? Depression. ? Anxiety. ? Fever. ? Seizures. ? Severe confusion. ? Feeling or seeing things that are not there (hallucinations). ? Tremors. ? Rapid heart rate. ? Rapid breathing. ? High blood pressure.  Drinking to avoid symptoms of withdrawal.  How is this diagnosed? This condition is diagnosed with an assessment. Your health care provider may start the assessment by asking three or four questions about your drinking. Your health care provider may perform a physical exam or do lab tests to see if you  have physical problems resulting from alcohol use. She or he may refer you to a mental health professional for evaluation. How is this treated? Some people with alcohol use disorder are able to reduce their alcohol use to low-risk levels. Others need to completely quit drinking alcohol. When necessary, mental health professionals with specialized training in substance use treatment can help.  Your health care provider can help you decide how severe your alcohol use disorder is and what type of treatment you need. The following forms of treatment are available:  Detoxification. Detoxification involves quitting drinking and using prescription medicines within the first week to help lessen withdrawal symptoms. This treatment is important for people who have had withdrawal symptoms before and for heavy drinkers who are likely to have withdrawal symptoms. Alcohol withdrawal can be dangerous, and in severe cases, it can cause death. Detoxification may be provided in a home, community, or primary care setting, or in a hospital or substance use treatment facility.  Counseling. This treatment is also called talk therapy. It is provided by substance use treatment counselors. A counselor can address the reasons you use alcohol and suggest ways to keep you from drinking again or to prevent problem drinking. The goals of talk therapy are to: ? Find healthy activities and ways for you to cope with stress. ? Identify and avoid the things that trigger your alcohol use. ? Help you learn how to handle cravings.  Medicines.Medicines can help treat alcohol use disorder by: ? Decreasing alcohol cravings. ? Decreasing the positive feeling you have when you drink alcohol. ? Causing an uncomfortable physical reaction when you drink alcohol (aversion therapy).  Support groups. Support groups are led by people who have quit drinking. They provide emotional support, advice, and guidance.  These forms of treatment are often combined. Some people with this condition benefit from a combination of treatments provided by specialized substance use treatment centers. Follow these instructions at home:  Take over-the-counter and prescription medicines only as told by your health care provider.  Check with your health care provider before starting any new medicines.  Ask friends and family members not to offer you  alcohol.  Avoid situations where alcohol is served, including gatherings where others are drinking alcohol.  Create a plan for what to do when you are tempted to use alcohol.  Find hobbies or activities that you enjoy that do not include alcohol.  Keep all follow-up visits as told by your health care provider. This is important. How is this prevented?  If you drink, limit alcohol intake to no more than 1 drink a day for nonpregnant women and 2 drinks a day for men. One drink equals 12 oz of beer, 5 oz of wine, or 1 oz of hard liquor.  If you have a mental health condition, get treatment and support.  Do not give alcohol to adolescents.  If you are an adolescent: ? Do not drink alcohol. ? Do not be afraid to say no if someone offers you alcohol. Speak up about why you do not want to drink. You can be a positive role model for your friends and set a good example for those around you by not drinking alcohol. ? If your friends drink, spend time with others who do not drink alcohol. Make new friends who do not use alcohol. ? Find healthy ways to manage stress and emotions, such as meditation or deep breathing, exercise, spending time in nature, listening to music, or talking with a trusted friend  or family member. Contact a health care provider if:  You are not able to take your medicines as told.  Your symptoms get worse.  You return to drinking alcohol (relapse) and your symptoms get worse. Get help right away if:  You have thoughts about hurting yourself or others. If you ever feel like you may hurt yourself or others, or have thoughts about taking your own life, get help right away. You can go to your nearest emergency department or call:  Your local emergency services (911 in the [place]  A suicide crisis helpline, such as the National Suicide Prevention Lifeline at 587-838-69631-217 496 0789. This is open 24 hours a day.  Summary  Alcohol use disorder is when your drinking disrupts your  daily life. When you have this condition, you drink too much alcohol and you cannot control your drinking.  Treatment may include detoxification, counseling, medicine, and support groups.  Ask friends and family members not to offer you alcohol. Avoid situations where alcohol is served.  Get help right away if you have thoughts about hurting yourself or others. This information is not intended to replace advice given to you by your health care provider. Make sure you discuss any questions you have with your health care provider. Document Released: 10/16/2004 Document Revised: 06/05/2016 Document Reviewed: 06/05/2016 Elsevier Interactive Patient Education  [employer]2018 [employer]

## 2017-10-07 NOTE — Clinical Social Work Note (Addendum)
Clinical Social Work Assessment  Patient Details  Name: Mark Meadows MRN: [id number] Date of Birth: June 15, 1951  Date of referral:  10/07/17               Reason for consult:  Facility Placement, Substance Use/ETOH Abuse                Permission sought to share information with:  Family Supports Permission granted to share information::     Name::        Agency::     Relationship::  brother in law(only if in emergency situation)  Contact Information:     Housing/Transportation Living arrangements for the past 2 months:  Single Family Home Source of Information:  Patient Patient Interpreter Needed:  None Criminal Activity/Legal Involvement Pertinent to Current Situation/Hospitalization:    Significant Relationships:  Pets, Other Family Members Lives with:  Self, Pets Do you feel safe going back to the place where you live?  Yes Need for family participation in patient care:  No (Coment)  Care giving concerns: Pt lives alone and states that he "drinks till I pass out," daily since the age of 58. Pt doesn't have support 24/7 and PT recommends continued support for mobility. SNF recommended as well as outpatient support for pt ETOH consumption.   Social Worker assessment / plan:  CSW met with pt at bedside. Pt is alert and amenable to speaking with CSW. Pt states that he lives alone in a one story home with a dog. Pt states that his dog is a source of comfort and enjoys taking care of her. Pt admits to "drinking till I pass out," daily since the age of 70. Pt previously ("30-40 years ago") went to a 30 day inpatient rehab after which he continued daily consumption of alcohol.   Pt states that recently his PCP NP has mentioned a psychiatrist that provides support to individuals with substance use disorder and pt is more amenable to taking NP up on her recommendation/referral. Pt states he does not want CSW to reach out to NP.   CSW asked if pt would like resources now to support him in  addressing his drinking, pt agreeable to packet of information with outpatient counseling and substance use support, however pt states that he would not like CSW to make any appointments for him. Pt states that he will most likely drink when he returns home but after this hospitalization and after adopting his dog he feels more of a pull to seek support for his drinking.   Pt also states that he does not want to accept SNF placement and would like to return home. Pt has been to [facility] in the past and although he did not mind placement there he states he just wants to go home.   CSW signing off, please reconsult if additional needs arise.   Employment status:  Retired Nurse, adult PT Recommendations:  [place] / Referral to community resources:  Outpatient Substance Abuse Treatment Options  Patient/Family's Response to care:  Pt amenable to packet of information, pt stats that he does not want to accept SNF referral or placement. Pt understands CSW role and resources available.   Patient/Family's Understanding of and Emotional Response to Diagnosis, Current Treatment, and Prognosis:  Pt understands diagnosis, current treatment and prognosis. Pt understands that safety is important if pt continues to drink. During assessment, pt was tearful at times but overall pleasant and accepting of community help in the future.  Emotional Assessment Appearance:  Appears stated age Attitude/Demeanor/Rapport:  (Calm; Cooperative) Affect (typically observed):  Accepting, Calm, Appropriate, Overwhelmed Orientation:  Oriented to Self, Oriented to Place, Oriented to  Time, Oriented to Situation Alcohol / Substance use:  Alcohol Use(daily ETOH consumption "till I pass out") Psych involvement (Current and /or in the community):  No (Comment)  Discharge Needs  Concerns to be addressed:  Substance Abuse Concerns, Home Safety Concerns Readmission within the  last 30 days:  No Current discharge risk:  Lives alone, Substance Abuse Barriers to Discharge:  Continued Medical Work up, Active Substance Use   Alexander Mt, [place] 10/07/2017, 10:29 AM

## 2017-10-07 NOTE — Progress Notes (Signed)
Occupational Therapy Treatment Patient Details Name: Mark Meadows MRN: [id number] DOB: November 29, 1950 Today's Date: 10/07/2017    History of present illness Pt adm with alcohol withdrawal, atrial flutter vs fibrillation, and UTI. PMH - ETOH, gout, afib, htn, vitamin B deficiency   OT comments  Pt demonstrating some progress toward OT goals today and was able to complete toilet transfers with min assist demonstrating increased independence with functional mobility. However, pt continues to demonstrate poor dynamic and static standing balance and unable to stand at sink for grooming tasks without min assist or B UE support. He continues to demonstrate decreased awareness of safety and deficits, decreased attention, and decreased understanding of impact of current deficits on functional performance. Feel pt is at significant risk of falling and unsafe to be at home without 24 hour assistance which is not available to him. Educated pt extensively on recommendation for SNF level rehabilitation although he continues to adamantly decline stating that he will be able to manage at home with his dog. Pt is agreeable to home health services and case management in to speak with pt at end of OT session to arrange. Educated pt that he is not safe to perform tub transfers at this time without physical assistance from home health therapies and he verbalizes understanding but reports he may not adhere to this recommendation. Acute OT will continue to follow while admitted.     Follow Up Recommendations  SNF;Supervision/Assistance - 24 hour    Equipment Recommendations  3 in 1 bedside commode;Tub/shower bench    Recommendations for Other Services      Precautions / Restrictions Precautions Precautions: Fall Restrictions Weight Bearing Restrictions: No       Mobility Bed Mobility               General bed mobility comments: OOB in chair on my arrival.   Transfers Overall transfer level: Needs  assistance Equipment used: Rolling walker (2 wheeled) Transfers: Sit to/from Stand Sit to Stand: Min assist         General transfer comment: Min assist to boost into full standing position.     Balance Overall balance assessment: Needs assistance Sitting-balance support: No upper extremity supported;Feet supported Sitting balance-Leahy Scale: Fair     Standing balance support: Bilateral upper extremity supported Standing balance-Leahy Scale: Poor Standing balance comment: Relies on assistance and RW. Unable to stand at sink for grooming tasks without UE support.                            ADL either performed or assessed with clinical judgement   ADL Overall ADL's : Needs assistance/impaired Eating/Feeding: Set up;Sitting   Grooming: Minimal assistance;Standing Grooming Details (indicate cue type and reason): Attempting to stand without UE support but unable to safely perform without posterior LOB.                  Toilet Transfer: Minimal assistance;Ambulation;RW Toilet Transfer Details (indicate cue type and reason): posterior LOB         Functional mobility during ADLs: Minimal assistance;Rolling walker General ADL Comments: Multiple episodes of LOB. Pt believes he is able to safely ambulate without RW for dunctional tasks. However, he continues to demonstrate significant posterior LOB throughout session and unable to ambulate without BUE support.      Vision   Vision Assessment?: Yes Eye Alignment: Impaired (comment)(disconjugate gaze with R eye laterally rotated) Additional Comments: no functional deficits noted although pt does  tend to close R eye when looking at therapist across the room.    Perception     Praxis      Cognition Arousal/Alertness: Awake/alert Behavior During Therapy: WFL for tasks assessed/performed Overall Cognitive Status: Impaired/Different from baseline Area of Impairment: Attention;Memory;Safety/judgement;Following  commands;Problem solving;Awareness                   Current Attention Level: Selective Memory: Decreased short-term memory;Decreased recall of precautions Following Commands: Follows multi-step commands inconsistently Safety/Judgement: Decreased awareness of safety;Decreased awareness of deficits Awareness: Emergent Problem Solving: Slow processing;Requires verbal cues;Requires tactile cues;Difficulty sequencing General Comments: Pt with poor understanding of deficits and unable to relate current difficulties with inability to perform functional tasks safely. Pt with unrealistic understanding of his functional capabilities currently and reports that he will not use assistive devices at home.         Exercises     Shoulder Instructions       General Comments Extensive education concerning recommendation for SNF level rehabilitation but pt remains adamantly opposed.     Pertinent Vitals/ Pain       Pain Assessment: Faces Faces Pain Scale: Hurts little more Pain Location: L knee Pain Descriptors / Indicators: Aching;Sore Pain Intervention(s): Limited activity within patient's tolerance;Monitored during session;Repositioned  Home Living                                          Prior Functioning/Environment              Frequency  Min 2X/week        Progress Toward Goals  OT Goals(current goals can now be found in the care plan section)  Progress towards OT goals: Progressing toward goals  Acute Rehab OT Goals Patient Stated Goal: go home today OT Goal Formulation: With patient Time For Goal Achievement: 10/20/17 Potential to Achieve Goals: Good  Plan Discharge plan remains appropriate    Co-evaluation                 AM-PAC PT "6 Clicks" Daily Activity     Outcome Measure   Help from another person eating meals?: A Little Help from another person taking care of personal grooming?: A Little Help from another person toileting,  which includes using toliet, bedpan, or urinal?: A Little Help from another person bathing (including washing, rinsing, drying)?: A Lot Help from another person to put on and taking off regular upper body clothing?: A Little Help from another person to put on and taking off regular lower body clothing?: A Lot 6 Click Score: 16    End of Session Equipment Utilized During Treatment: Gait belt;Rolling walker  OT Visit Diagnosis: Other abnormalities of gait and mobility (R26.89);Pain Pain - Right/Left: Left Pain - part of body: Knee   Activity Tolerance Patient tolerated treatment well   Patient Left in chair;with call bell/phone within reach;with chair alarm set   Nurse Communication Mobility status(case manager: recommendations)        Time: 1434-1500 OT Time Calculation (min): 26 min  Charges: OT General Charges $OT Visit: 1 Visit OT Treatments $Self Care/Home Management : 23-37 mins  Doristine Sectionharity A Chance Munter, MS OTR/L  Pager: [phone]    Geroge Gilliam A Tolulope Pinkett 10/07/2017, 3:25 PM

## 2017-10-07 NOTE — Care Management Note (Addendum)
Case Management Note  Patient Details  Name: Mark BaliJeffrey Meadows MRN: [id number] Date of Birth: [birth year]  Subjective/Objective:                    Action/Plan: Patient refused 3 in 1 and tub bench   Expected Discharge Date:  10/07/17               Expected Discharge Plan:  Home/Self Care  In-House Referral:     Discharge planning Services  CM Consult  Post Acute Care Choice:  Durable Medical Equipment Choice offered to:     DME Arranged:  3-N-1, Tub bench DME Agency:  [facility]  HH Arranged: PT HH Agency:   [facility]  Status of Service:  Completed, signed off  If discussed at Long Length of Stay Meetings, dates discussed:    Additional Comments:  Kingsley PlanWile, [name], RN 10/07/2017, 2:36 PM

## 2017-10-07 NOTE — Progress Notes (Signed)
Progress Note  Patient Name: Mark Meadows Date of Encounter: 10/07/2017  Primary Cardiologist: No primary care provider on file. [place]  Subjective   No CP, no SOB, back in flutter  Inpatient Medications    Scheduled Meds: . amLODipine  10 mg Oral Daily  . folic acid  1 mg Oral Daily  . levothyroxine  325 mcg Oral QAC breakfast  . metoprolol succinate  50 mg Oral Daily  . pantoprazole  40 mg Oral Q1200  . thiamine  100 mg Oral Daily  . vitamin B-12  1,000 mcg Oral Daily   Continuous Infusions: . magnesium sulfate 1 - 4 g bolus IVPB 2 g (10/07/17 0934)   PRN Meds: acetaminophen **OR** acetaminophen, LORazepam **OR** LORazepam, magnesium hydroxide   Vital Signs    Vitals:   10/06/17 1402 10/06/17 1500 10/06/17 2107 10/07/17 0514  BP: (!) 140/97 120/85 104/73 110/71  Pulse: 98 91 90 66  Resp: 18  19 18   Temp: 99 F (37.2 C)  97.6 F (36.4 C) 99.1 F (37.3 C)  TempSrc: Oral  Oral Oral  SpO2: 99% 95% 96% 98%  Weight:      Height:        Intake/Output Summary (Last 24 hours) at 10/07/2017 1004 Last data filed at 10/07/2017 0847 Gross per 24 hour  Intake 486 ml  Output 550 ml  Net -64 ml   Filed Weights   10/03/17 0445 10/05/17 1645  Weight: 212 lb 8.4 oz (96.4 kg) 220 lb (99.8 kg)    Telemetry    Atrial flutter restarted 9am, 90's - Personally Reviewed  ECG    No new - Personally Reviewed  Physical Exam   GEN: No acute distress.   Neck: No JVD Cardiac: irreg, no murmurs, rubs, or gallops.  Respiratory: Clear to auscultation bilaterally. GI: Soft, nontender, non-distended  MS: No edema; No deformity. Neuro:  Nonfocal  Psych: Normal affect   Labs    Chemistry Recent Labs  Lab 10/04/17 0229 10/05/17 0819 10/06/17 0857 10/07/17 0407  NA 138 136 134* 135  K 2.8* 3.2* 3.0* 3.9  CL 101 103 102 104  CO2 22 22 21* 21*  GLUCOSE 105* 124* 117* 109*  BUN 7 6 12 15   CREATININE 0.96 1.01 1.11 1.16  CALCIUM 8.8* 9.0 8.9 9.0  PROT 6.9 7.3 7.0   --   ALBUMIN 3.5 3.3* 3.3*  --   AST 54* 85* 113*  --   ALT 35 47 69*  --   ALKPHOS 69 69 69  --   BILITOT 2.9* 2.2* 2.5*  --   GFRNONAA >60 >60 >60 >60  GFRAA >60 >60 >60 >60  ANIONGAP 15 11 11 10      Hematology Recent Labs  Lab 10/05/17 0819 10/06/17 0857 10/07/17 0407  WBC 9.2 9.6 9.5  RBC 3.54* 3.47* 3.27*  HGB 13.3 12.8* 12.2*  HCT 37.3* 37.1* 35.3*  MCV 105.4* 106.9* 108.0*  MCH 37.6* 36.9* 37.3*  MCHC 35.7 34.5 34.6  RDW 14.9 15.2 15.2  PLT 79* 83* 93*    Cardiac EnzymesNo results for input(s): TROPONINI in the last 168 hours.  Recent Labs  Lab 10/02/17 0904  TROPIPOC 0.00     BNPNo results for input(s): BNP, PROBNP in the last 168 hours.   DDimer No results for input(s): DDIMER in the last 168 hours.   Radiology    [place] Abdomen Complete  Result Date: 10/05/2017 CLINICAL DATA:  Thrombocytopenia, alcoholic liver disease EXAM: ABDOMEN ULTRASOUND COMPLETE COMPARISON:  Ultrasound 08/23/2016 FINDINGS: Gallbladder: Multiple stones and sludge in the gallbladder. Negative sonographic Murphy's. Stones measure up to 1.9 cm. Slight increased wall thickness at 4.3 mm Common bile duct: Diameter: 3.6 mm Liver: Slight increase in size, measuring 17.8 cm. Increased hepatic echogenicity portal vein is patent on color Doppler imaging with normal direction of blood flow towards the liver. IVC: No abnormality visualized. Pancreas: Poorly visible due to gas Spleen: Size and appearance within normal limits. Right Kidney: Length: 11.5 cm. Echogenicity within normal limits. No mass or hydronephrosis visualized. Punctate echogenic foci in the lower pole and midpole could reflect small stones or vascular calcification. Left Kidney: Length: 11.3 cm. Echogenicity within normal limits. No mass or hydronephrosis visualized. Abdominal aorta: No aneurysm visualized. Other findings: No ascites IMPRESSION: 1. Multiple stones and sludge in the gallbladder with slight increased wall thickness. This is a  nonspecific finding and can be seen in the setting of liver disease, or acute or chronic cholecystitis 2. Increased hepatic echogenicity consistent with steatosis or hepatocellular disease Electronically Signed   By: Jasmine Pang M.D.   On: 10/05/2017 23:43    Cardiac Studies   ECHO 10/06/17 - The patient was in atrial fibrillation. Normal LV size with EF   55%. Moderate LV hypertrophy. Normal RV size and systolic   function. Biatrial enlargement.  Patient Profile     67 y.o. male with DT's, atrial flutter parox  Assessment & Plan    AFIB/Flutter parox  - will increase metoprolol succinate to 100 QD  - poor candidate for chronic anticoagulation.   - K improved  - EF normal  ETOH withdrawal  - per main team.   For questions or updates, please contact [facility] Please consult [email or web address] for contact info under Cardiology/STEMI.      Signed, Donato Schultz, MD  10/07/2017, 10:04 AM

## 2017-10-08 NOTE — Discharge Summary (Signed)
Family Medicine Teaching [facility] Discharge Summary  Patient name: Mark Meadows Medical record number: [id number] Date of birth: [birth year] Age: 67 y.o. Gender: male Date of Admission: 10/02/2017  Date of Discharge: 10/07/17 Admitting Physician: Wendee Beavers, DO  Primary Care Provider: Elizabeth Palau, FNP Consultants: Cardiology  Indication for Hospitalization: alcohol withdrawal  Discharge Diagnoses/Problem List:  Chronic alcoholism Atrial fibrillation Atrial flutter HTN Hypothyroidism  Disposition: Home  Discharge Condition: stable, improved  Discharge Exam: please see progress note from day of discharge  Brief Hospital Course:  Mark Meadows was admitted on 10/02/17 for alcohol withdrawal due to frequent vomiting.  Upon arrival to the ED, he required significant doses of Ativan for CIWA scores up to 23.  He had a significant tremor, was agitated, had coffee ground emesis, and visual/tactile disturbances.  He was given Librium as a long-acting anti-withdrawal medication in addition to Ativan per CIWA protocol from 1/11-1/14.  He was able to transition off of Librium and had low CIWA scores on 1/15 and 1/16, with his last CIWA scores equal to zero during his last day of admission.  Counseling about the risks of continued use of alcohol was performed multiple times with the patient, with the patient expressing ambivalence toward alcohol cessation, stating that he would probably continue drinking at home, but he also wanted to get healthier for himself and his dog.  Mr. Samad said that AA had not appealed to him because of its religious component, but he was interested in being referred to a psychiatrist to discuss his alcoholism since his PCP had made this suggestion in the past.  Social work provided a packet of information regarding community resources for alcohol rehabilitation.  PT and OT both recommended SNF placement for patient, but patient only wanted to be discharged  home.  Mr. Schaffert had tachycardia and an irregular heart rhythm throughout his admission and was found to have atrial fibrillation and atrial flutter confirmed on EKG, so cardiology was consulted.  They recommended starting metoprolol XL 100 mg daily but abstaining from anticoagulation given patient's low platelet count, alcoholism, and lack of adherence in the past.  The patient was explained the importance of this new medication as well as his other medications, so hopefully he will continue to take them on discharge.  Issues for Follow Up:  1. Patient needs to have close follow up with his PCP.  He was asked to schedule an appointment with her the week of discharge.  He will likely be interested in a psychiatrist referral from her if she thinks this is indicated. 2. He would benefit from a neuropsychiatric assessment in the [facility] in about 4-6 weeks if possible. 3. Medication compliance and abstention from alcohol will be vital for his future quality of life.  Close follow up will hopefully facilitate this. 4. Patient is a fall risk due to his impaired mobility and spatial awareness.  He would benefit from an Assisted Living Facility in the future if he is willing.  Significant Procedures: none  Significant Labs and Imaging:  Recent Labs  Lab 10/05/17 0819 10/06/17 0857 10/07/17 0407  WBC 9.2 9.6 9.5  HGB 13.3 12.8* 12.2*  HCT 37.3* 37.1* 35.3*  PLT 79* 83* 93*   Recent Labs  Lab 10/02/17 1029 10/02/17 1816 10/03/17 0601 10/04/17 0229 10/05/17 0819 10/06/17 0857 10/07/17 0407 10/07/17 0849  NA  --   --  138 138 136 134* 135  --   K  --   --  3.2* 2.8* 3.2* 3.0* 3.9  --   CL  --   --  99* 101 103 102 104  --   CO2  --   --  27 22 22  21* 21*  --   GLUCOSE  --   --  126* 105* 124* 117* 109*  --   BUN  --   --  9 7 6 12 15   --   CREATININE  --   --  0.96 0.96 1.01 1.11 1.16  --   CALCIUM  --   --  9.1 8.8* 9.0 8.9 9.0  --   MG  --  1.6* 1.8   --  1.4* 1.7  --  1.7  PHOS  --  2.5  --   --   --   --   --   --   ALKPHOS 82  --  76 69 69 69  --   --   AST 89*  --  60* 54* 85* 113*  --   --   ALT 51  --  41 35 47 69*  --   --   ALBUMIN 4.1  --  3.7 3.5 3.3* 3.3*  --   --     Dg Chest 2 View  Result Date: 10/02/2017 CLINICAL DATA:  Right-sided chest pain EXAM: CHEST  2 VIEW COMPARISON:  09/18/2016 FINDINGS: Moderate cardiac enlargement. Large hiatal hernia. No pleural effusion or edema. No airspace opacities. Aortic atherosclerosis noted. IMPRESSION: 1. No acute cardiopulmonary abnormalities. 2. Large hiatal hernia. Electronically Signed   By: Signa Kell M.D.   On: 10/02/2017 09:17   Results/Tests Pending at Time of Discharge: none  Discharge Medications:  Allergies as of 10/07/2017   No Known Allergies     Medication List    STOP taking these medications   metoprolol tartrate 25 MG tablet Commonly known as:  LOPRESSOR   pantoprazole 40 MG tablet Commonly known as:  PROTONIX     TAKE these medications   acetaminophen 500 MG tablet Commonly known as:  TYLENOL Take 500 mg by mouth every 6 (six) hours as needed for mild pain.   amLODipine 10 MG tablet Commonly known as:  NORVASC Take 1 tablet (10 mg total) by mouth daily.   cyanocobalamin 1000 MCG tablet Take 1 tablet (1,000 mcg total) by mouth daily.   folic acid 1 MG tablet Commonly known as:  FOLVITE Take 1 tablet (1 mg total) by mouth daily.   levothyroxine 300 MCG tablet Commonly known as:  SYNTHROID, LEVOTHROID Take 1 tablet (300 mcg total) by mouth daily before breakfast. What changed:  how much to take   metoprolol succinate 100 MG 24 hr tablet Commonly known as:  TOPROL-XL Take 1 tablet (100 mg total) by mouth daily. Take with or immediately following a meal.   omeprazole 20 MG capsule Commonly known as:  PRILOSEC Take 20 mg by mouth daily.   tamsulosin 0.4 MG Caps capsule Commonly known as:  FLOMAX Take 1 capsule (0.4 mg total) by mouth  daily after supper.   thiamine 100 MG tablet Take 1 tablet (100 mg total) by mouth daily.       Discharge Instructions: Please refer to Patient Instructions section of EMR for full details.  Patient was counseled important signs and symptoms that should prompt return to medical care, changes in medications, dietary instructions, activity restrictions, and follow up appointments.   Follow-Up Appointments: Follow-up Information    [facility] - Dme Follow up.  Why:  Provide home health PT Contact information: [address] [place] [id number] [phone]           Lennox SoldersWinfrey, Emilea Goga C, MD 10/08/2017, 2:09 PM PGY-1, [facility]

## 2019-08-27 IMAGING — US US ABDOMEN COMPLETE
1 series · 13 of 25 positions shown · non-contrast
Comparison: Ultrasound 08/23/2016

CLINICAL DATA: Thrombocytopenia, alcoholic liver disease

EXAM:
ABDOMEN ULTRASOUND COMPLETE

[Series 1: us abdomen complete · 0.26mm/px · 13 of 84 slices shown]
[im 1/84]
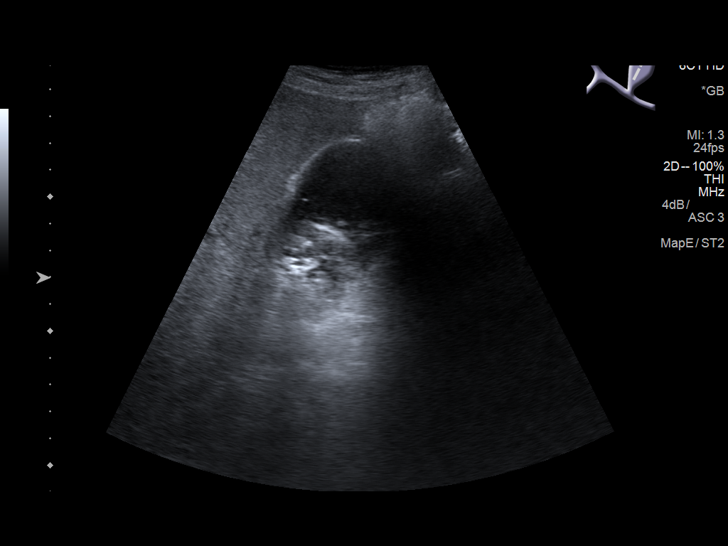
[im 7/84]
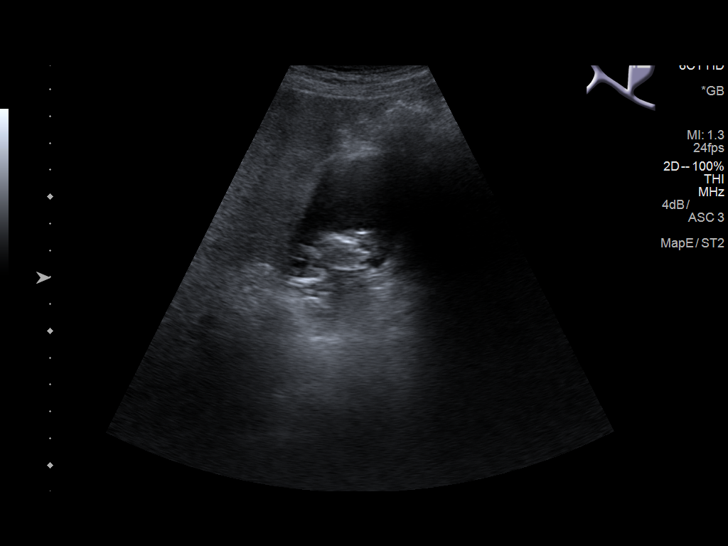
[im 14/84]
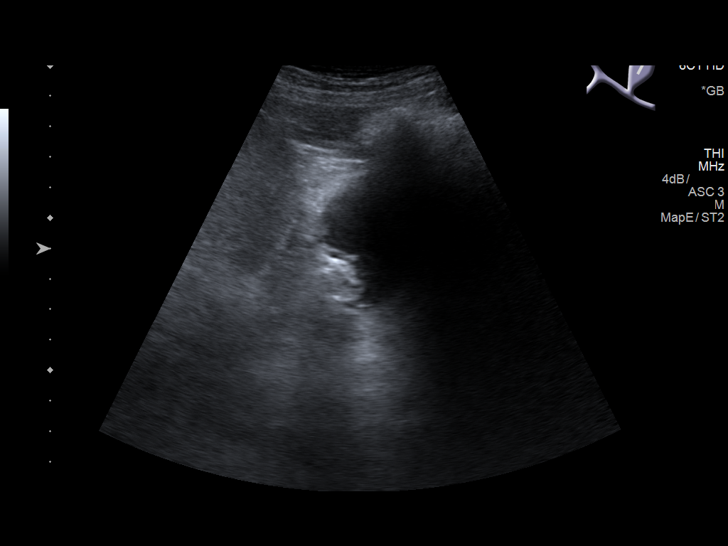
[im 21/84]
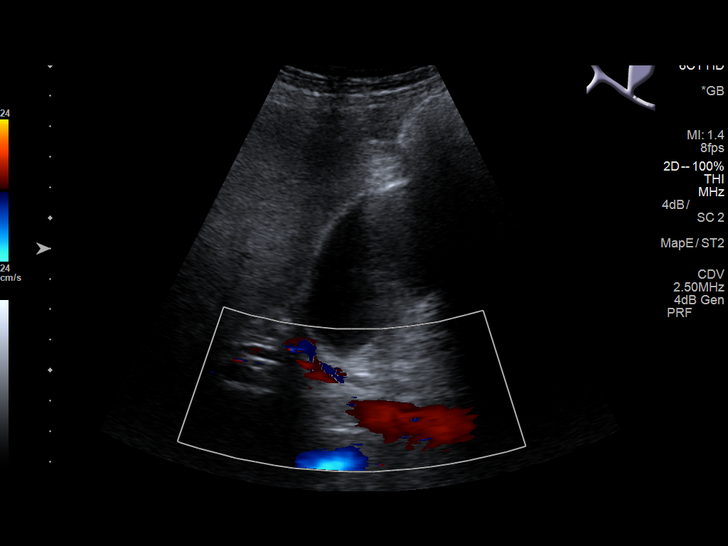
[im 28/84]
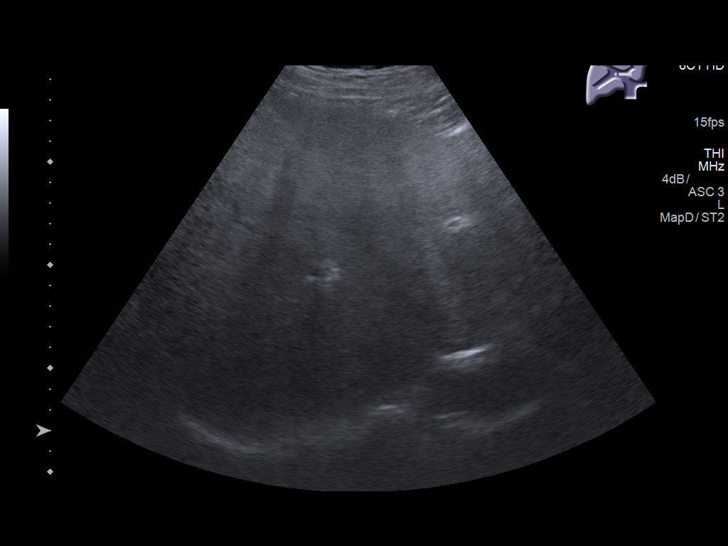
[im 35/84]
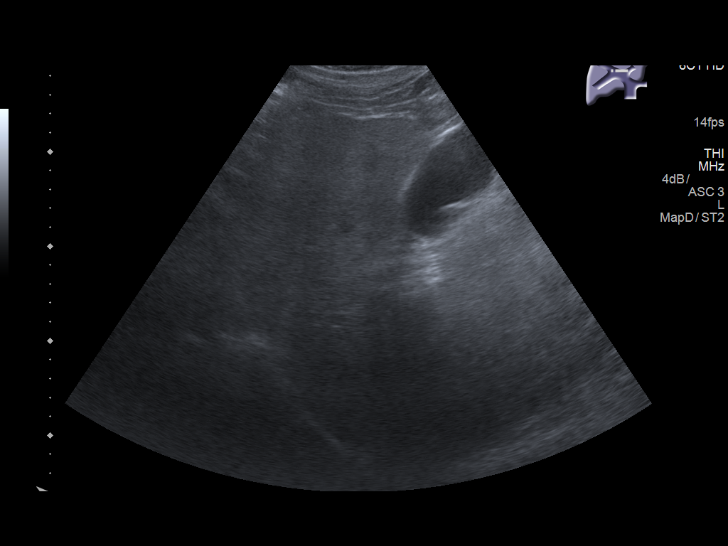
[im 42/84]
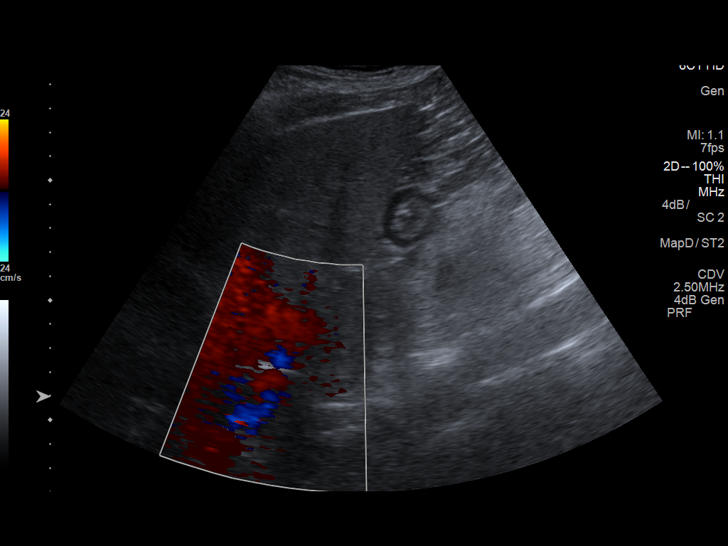
[im 49/84]
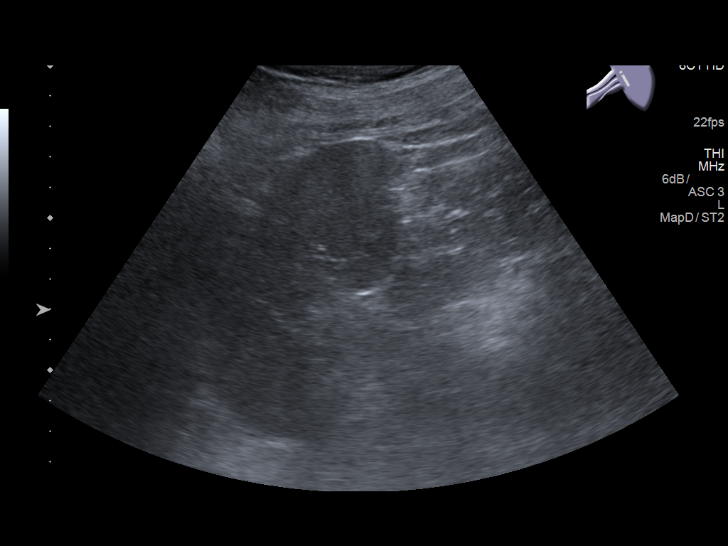
[im 56/84]
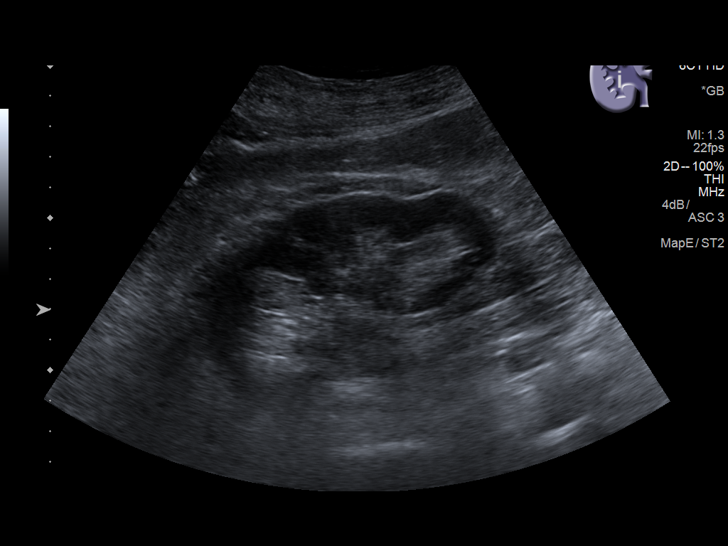
[im 63/84]
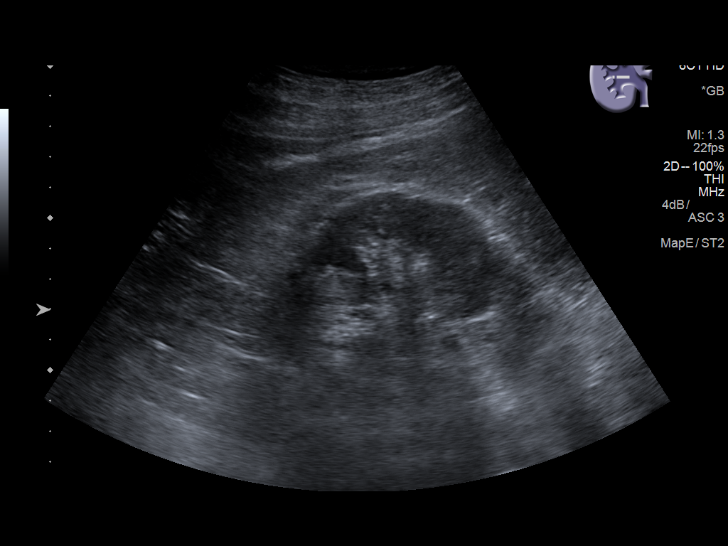
[im 70/84]
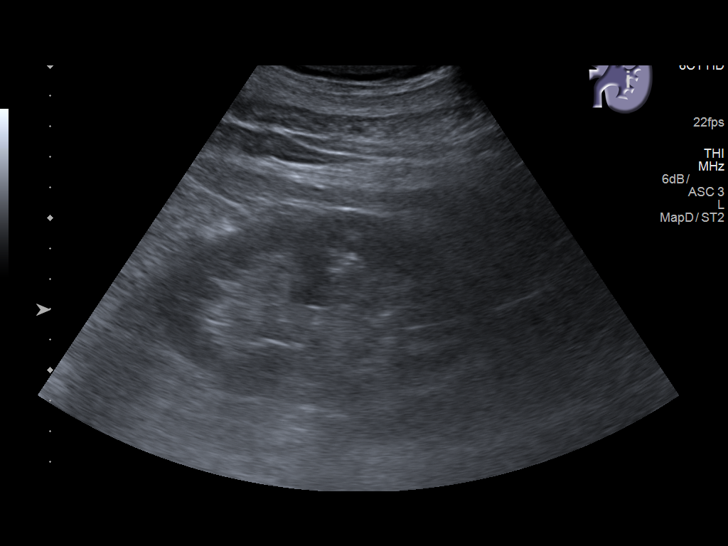
[im 77/84]
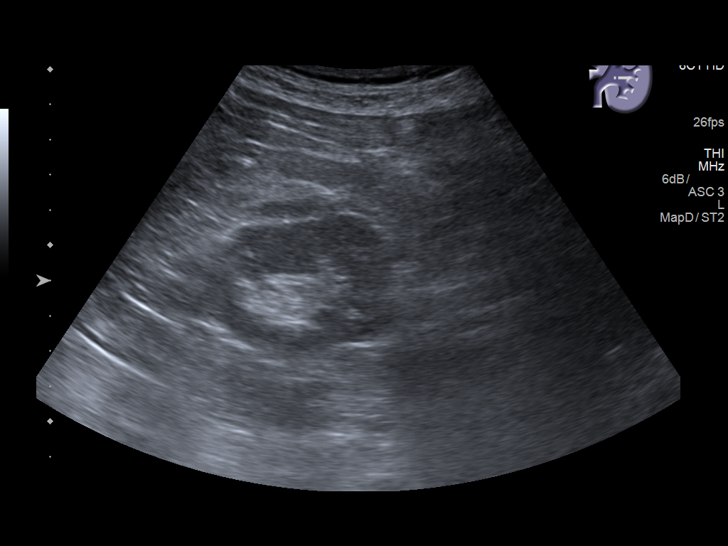
[im 84/84]
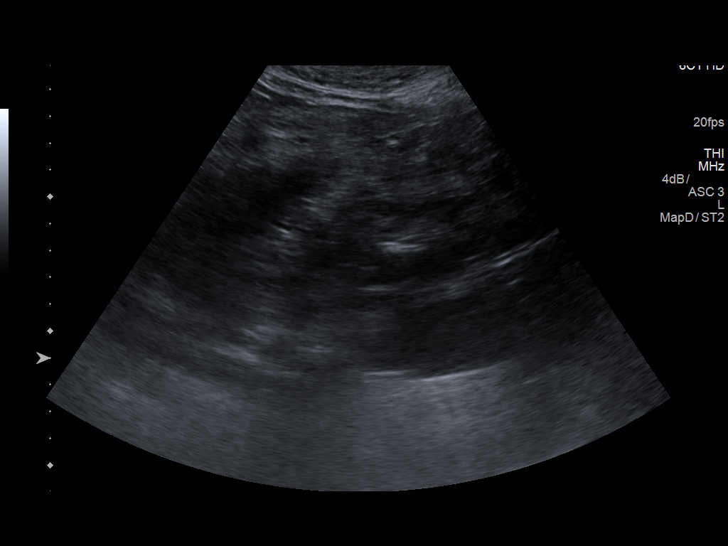

[13 of 25 positions shown; findings below may reference images not displayed]

FINDINGS: Gallbladder: Multiple stones and sludge in the gallbladder. Negative
sonographic [place]. Stones measure up to 1.9 cm. Slight increased
wall thickness at 4.3 mm

Common bile duct: Diameter: 3.6 mm

Liver: Slight increase in size, measuring 17.8 cm. Increased hepatic
echogenicity portal vein is patent on color Doppler imaging with
normal direction of blood flow towards the liver.

IVC: No abnormality visualized.

Pancreas: Poorly visible due to gas

Spleen: Size and appearance within normal limits.

Right Kidney: Length: 11.5 cm. Echogenicity within normal limits. No
mass or hydronephrosis visualized. Punctate echogenic foci in the
lower pole and midpole could reflect small stones or vascular
calcification.

Left Kidney: Length: 11.3 cm. Echogenicity within normal limits. No
mass or hydronephrosis visualized.

Abdominal aorta: No aneurysm visualized.

Other findings: No ascites
IMPRESSION: 1. Multiple stones and sludge in the gallbladder with slight
increased wall thickness. This is a nonspecific finding and can be
seen in the setting of liver disease, or acute or chronic
cholecystitis
2. Increased hepatic echogenicity consistent with steatosis or
hepatocellular disease

## 2023-08-10 ENCOUNTER — Ambulatory Visit (INDEPENDENT_AMBULATORY_CARE_PROVIDER_SITE_OTHER): Payer: Medicare Other | Admitting: Podiatry

## 2023-08-10 DIAGNOSIS — G629 Polyneuropathy, unspecified: Secondary | ICD-10-CM

## 2023-08-10 DIAGNOSIS — F1020 Alcohol dependence, uncomplicated: Secondary | ICD-10-CM

## 2023-08-10 NOTE — Progress Notes (Signed)
  Subjective:  Patient ID: Mark Meadows, male    DOB: 06-Oct-1950,   MRN: [id number]  No chief complaint on file.   72 y.o. male presents for concern of bilateral foot pains and wanting a foot check. . Relates burning and tingling in their feet. Patient is not diabetic but does have a history of alcohol abuse and some pack pains.   PCP:  Sharmon Revere, MD    . Denies any other pedal complaints. Denies n/v/f/c.   Past Medical History:  Diagnosis Date   Acute blood loss anemia    Alcohol dependence (HCC)    Alcoholic hepatitis    Dysrhythmia    a. 08/2016 -?afib, not clear. b. 09/2017 - official dx atrial flutter.   E. coli UTI 09/22/2016   GERD (gastroesophageal reflux disease)    Gout    Hematochezia    Hyperglycemia    Hypertension    Hypokalemia    Hypothyroidism    Thrombocytopenia (HCC)     Objective:  Physical Exam: Vascular: DP/PT pulses 2/4 bilateral. CFT <3 seconds. Normal hair growth on digits. No edema.  Skin. No lacerations or abrasions bilateral feet.  Musculoskeletal: MMT 5/5 bilateral lower extremities in DF, PF, Inversion and Eversion. Deceased ROM in DF of ankle joint. 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  Neurological: Sensation intact to light touch. Protective sesnation intact.   Assessment:   1. Neuropathy   2. Alcohol use disorder, severe, dependence (HCC)      Plan:  Patient was evaluated and treated and all questions answered. Discussed neuropathy and etiology as well as treatment with patient.  Radiographs reviewed and discussed with patient.  -Discussed and educated patient on diabetic foot care, especially with  regards to the vascular, neurological and musculoskeletal systems.  -Stressed the importance of good glycemic control and the detriment of not  controlling glucose levels in relation to the foot. -Discussed supportive shoes at all times and checking feet regularly.  -Discussed CMO and other options for inserts.  -Prescription for  capsaicin cream provided.  -Patient to return as needed for worsening symptoms.    Louann Sjogren, DPM

## 2024-02-15 ENCOUNTER — Inpatient Hospital Stay (HOSPITAL_COMMUNITY)
Admission: EM | Admit: 2024-02-15 | Discharge: 2024-02-29 | DRG: 321 | Disposition: A | Discharge status: Home / Self Care | Attending: Cardiology | Admitting: Cardiology

## 2024-02-15 ENCOUNTER — Emergency Department (HOSPITAL_COMMUNITY)

## 2024-02-15 ENCOUNTER — Other Ambulatory Visit: Payer: Self-pay

## 2024-02-15 ENCOUNTER — Encounter (HOSPITAL_COMMUNITY)
Admission: EM | Disposition: A | Discharge status: Home / Self Care | Payer: Self-pay | Source: Home / Self Care | Attending: Cardiovascular Disease

## 2024-02-15 DIAGNOSIS — I5021 Acute systolic (congestive) heart failure: Principal | ICD-10-CM

## 2024-02-15 DIAGNOSIS — Z7901 Long term (current) use of anticoagulants: Secondary | ICD-10-CM

## 2024-02-15 DIAGNOSIS — E039 Hypothyroidism, unspecified: Secondary | ICD-10-CM | POA: Diagnosis present

## 2024-02-15 DIAGNOSIS — N1831 Chronic kidney disease, stage 3a: Secondary | ICD-10-CM | POA: Diagnosis present

## 2024-02-15 DIAGNOSIS — I5023 Acute on chronic systolic (congestive) heart failure: Secondary | ICD-10-CM | POA: Diagnosis present

## 2024-02-15 DIAGNOSIS — Z79899 Other long term (current) drug therapy: Secondary | ICD-10-CM

## 2024-02-15 DIAGNOSIS — Z6835 Body mass index (BMI) 35.0-35.9, adult: Secondary | ICD-10-CM

## 2024-02-15 DIAGNOSIS — K029 Dental caries, unspecified: Secondary | ICD-10-CM | POA: Diagnosis present

## 2024-02-15 DIAGNOSIS — E785 Hyperlipidemia, unspecified: Secondary | ICD-10-CM | POA: Diagnosis present

## 2024-02-15 DIAGNOSIS — D509 Iron deficiency anemia, unspecified: Secondary | ICD-10-CM | POA: Diagnosis present

## 2024-02-15 DIAGNOSIS — I252 Old myocardial infarction: Secondary | ICD-10-CM

## 2024-02-15 DIAGNOSIS — I272 Pulmonary hypertension, unspecified: Secondary | ICD-10-CM | POA: Diagnosis present

## 2024-02-15 DIAGNOSIS — I2102 ST elevation (STEMI) myocardial infarction involving left anterior descending coronary artery: Principal | ICD-10-CM | POA: Diagnosis present

## 2024-02-15 DIAGNOSIS — I130 Hypertensive heart and chronic kidney disease with heart failure and stage 1 through stage 4 chronic kidney disease, or unspecified chronic kidney disease: Secondary | ICD-10-CM | POA: Diagnosis present

## 2024-02-15 DIAGNOSIS — I213 ST elevation (STEMI) myocardial infarction of unspecified site: Secondary | ICD-10-CM | POA: Diagnosis present

## 2024-02-15 DIAGNOSIS — Z955 Presence of coronary angioplasty implant and graft: Secondary | ICD-10-CM

## 2024-02-15 DIAGNOSIS — I340 Nonrheumatic mitral (valve) insufficiency: Secondary | ICD-10-CM | POA: Diagnosis present

## 2024-02-15 DIAGNOSIS — Z79890 Hormone replacement therapy (postmenopausal): Secondary | ICD-10-CM

## 2024-02-15 DIAGNOSIS — I10 Essential (primary) hypertension: Secondary | ICD-10-CM | POA: Diagnosis not present

## 2024-02-15 DIAGNOSIS — K047 Periapical abscess without sinus: Secondary | ICD-10-CM | POA: Diagnosis present

## 2024-02-15 DIAGNOSIS — I480 Paroxysmal atrial fibrillation: Secondary | ICD-10-CM | POA: Diagnosis present

## 2024-02-15 DIAGNOSIS — I350 Nonrheumatic aortic (valve) stenosis: Secondary | ICD-10-CM | POA: Diagnosis present

## 2024-02-15 DIAGNOSIS — K709 Alcoholic liver disease, unspecified: Secondary | ICD-10-CM | POA: Diagnosis present

## 2024-02-15 DIAGNOSIS — R000 Tachycardia, unspecified: Secondary | ICD-10-CM | POA: Diagnosis not present

## 2024-02-15 DIAGNOSIS — E1122 Type 2 diabetes mellitus with diabetic chronic kidney disease: Secondary | ICD-10-CM | POA: Diagnosis present

## 2024-02-15 DIAGNOSIS — L89151 Pressure ulcer of sacral region, stage 1: Secondary | ICD-10-CM | POA: Diagnosis present

## 2024-02-15 DIAGNOSIS — Z7902 Long term (current) use of antithrombotics/antiplatelets: Secondary | ICD-10-CM

## 2024-02-15 DIAGNOSIS — R570 Cardiogenic shock: Secondary | ICD-10-CM | POA: Diagnosis not present

## 2024-02-15 DIAGNOSIS — I2510 Atherosclerotic heart disease of native coronary artery without angina pectoris: Secondary | ICD-10-CM

## 2024-02-15 DIAGNOSIS — I351 Nonrheumatic aortic (valve) insufficiency: Secondary | ICD-10-CM | POA: Diagnosis present

## 2024-02-15 DIAGNOSIS — F1021 Alcohol dependence, in remission: Secondary | ICD-10-CM | POA: Diagnosis present

## 2024-02-15 DIAGNOSIS — Z8349 Family history of other endocrine, nutritional and metabolic diseases: Secondary | ICD-10-CM

## 2024-02-15 DIAGNOSIS — I4892 Unspecified atrial flutter: Secondary | ICD-10-CM | POA: Diagnosis present

## 2024-02-15 DIAGNOSIS — I5189 Other ill-defined heart diseases: Secondary | ICD-10-CM | POA: Diagnosis not present

## 2024-02-15 DIAGNOSIS — I255 Ischemic cardiomyopathy: Secondary | ICD-10-CM | POA: Diagnosis present

## 2024-02-15 DIAGNOSIS — I513 Intracardiac thrombosis, not elsewhere classified: Secondary | ICD-10-CM | POA: Diagnosis present

## 2024-02-15 DIAGNOSIS — M109 Gout, unspecified: Secondary | ICD-10-CM | POA: Diagnosis present

## 2024-02-15 DIAGNOSIS — Z7401 Bed confinement status: Secondary | ICD-10-CM | POA: Diagnosis not present

## 2024-02-15 DIAGNOSIS — E669 Obesity, unspecified: Secondary | ICD-10-CM | POA: Diagnosis present

## 2024-02-15 DIAGNOSIS — Z87891 Personal history of nicotine dependence: Secondary | ICD-10-CM

## 2024-02-15 DIAGNOSIS — I509 Heart failure, unspecified: Secondary | ICD-10-CM | POA: Diagnosis not present

## 2024-02-15 DIAGNOSIS — Z8249 Family history of ischemic heart disease and other diseases of the circulatory system: Secondary | ICD-10-CM

## 2024-02-15 DIAGNOSIS — R0602 Shortness of breath: Secondary | ICD-10-CM | POA: Diagnosis not present

## 2024-02-15 DIAGNOSIS — L8990 Pressure ulcer of unspecified site, unspecified stage: Secondary | ICD-10-CM | POA: Insufficient documentation

## 2024-02-15 HISTORY — PX: CORONARY/GRAFT ACUTE MI REVASCULARIZATION: CATH118305

## 2024-02-15 HISTORY — PX: LEFT HEART CATH AND CORONARY ANGIOGRAPHY: CATH118249

## 2024-02-15 LAB — CBC WITH DIFFERENTIAL/PLATELET
Abs Immature Granulocytes: 0.12 10*3/uL — ABNORMAL HIGH (ref 0.00–0.07)
Basophils Absolute: 0.1 10*3/uL (ref 0.0–0.1)
Basophils Relative: 1 %
Eosinophils Absolute: 0 10*3/uL (ref 0.0–0.5)
Eosinophils Relative: 0 %
HCT: 29.5 % — ABNORMAL LOW (ref 39.0–52.0)
Hemoglobin: 8.2 g/dL — ABNORMAL LOW (ref 13.0–17.0)
Immature Granulocytes: 1 %
Lymphocytes Relative: 12 %
Lymphs Abs: 1.8 10*3/uL (ref 0.7–4.0)
MCH: 23.5 pg — ABNORMAL LOW (ref 26.0–34.0)
MCHC: 27.8 g/dL — ABNORMAL LOW (ref 30.0–36.0)
MCV: 84.5 fL (ref 80.0–100.0)
Monocytes Absolute: 1.6 10*3/uL — ABNORMAL HIGH (ref 0.1–1.0)
Monocytes Relative: 11 %
Neutro Abs: 10.9 10*3/uL — ABNORMAL HIGH (ref 1.7–7.7)
Neutrophils Relative %: 75 %
Platelets: 356 10*3/uL (ref 150–400)
RBC: 3.49 MIL/uL — ABNORMAL LOW (ref 4.22–5.81)
RDW: 18.4 % — ABNORMAL HIGH (ref 11.5–15.5)
WBC: 14.5 10*3/uL — ABNORMAL HIGH (ref 4.0–10.5)
nRBC: 0 % (ref 0.0–0.2)

## 2024-02-15 LAB — COMPREHENSIVE METABOLIC PANEL WITH GFR
ALT: 21 U/L (ref 0–44)
AST: 115 U/L — ABNORMAL HIGH (ref 15–41)
Albumin: 3.2 g/dL — ABNORMAL LOW (ref 3.5–5.0)
Alkaline Phosphatase: 56 U/L (ref 38–126)
Anion gap: 12 (ref 5–15)
BUN: 26 mg/dL — ABNORMAL HIGH (ref 8–23)
CO2: 22 mmol/L (ref 22–32)
Calcium: 9.6 mg/dL (ref 8.9–10.3)
Chloride: 102 mmol/L (ref 98–111)
Creatinine, Ser: 1.57 mg/dL — ABNORMAL HIGH (ref 0.61–1.24)
GFR, Estimated: 46 mL/min — ABNORMAL LOW (ref >60)
Glucose, Bld: 184 mg/dL — ABNORMAL HIGH (ref 70–99)
Potassium: 3.6 mmol/L (ref 3.5–5.1)
Sodium: 136 mmol/L (ref 135–145)
Total Bilirubin: 1.1 mg/dL (ref 0.0–1.2)
Total Protein: 7.8 g/dL (ref 6.5–8.1)

## 2024-02-15 LAB — I-STAT CG4 LACTIC ACID, ED: Lactic Acid, Venous: 2.7 mmol/L (ref 0.5–1.9)

## 2024-02-15 LAB — POCT ACTIVATED CLOTTING TIME
Activated Clotting Time: 176 s
Activated Clotting Time: 216 s
Activated Clotting Time: 228 s
Activated Clotting Time: 233 s
Activated Clotting Time: 239 s

## 2024-02-15 LAB — LIPID PANEL
Cholesterol: 92 mg/dL (ref 0–200)
HDL: 21 mg/dL — ABNORMAL LOW (ref >40)
LDL Cholesterol: 58 mg/dL (ref 0–99)
Total CHOL/HDL Ratio: 4.4 ratio
Triglycerides: 67 mg/dL (ref <150)
VLDL: 13 mg/dL (ref 0–40)

## 2024-02-15 LAB — PROTIME-INR
INR: 1.2 (ref 0.8–1.2)
Prothrombin Time: 15.8 s — ABNORMAL HIGH (ref 11.4–15.2)

## 2024-02-15 LAB — HEMOGLOBIN A1C
Hgb A1c MFr Bld: 6.4 % — ABNORMAL HIGH (ref 4.8–5.6)
Mean Plasma Glucose: 136.98 mg/dL

## 2024-02-15 LAB — TROPONIN I (HIGH SENSITIVITY)
Troponin I (High Sensitivity): >24000 ng/L (ref <18)
Troponin I (High Sensitivity): >24000 ng/L (ref <18)

## 2024-02-15 LAB — TSH: TSH: 6.656 u[IU]/mL — ABNORMAL HIGH (ref 0.350–4.500)

## 2024-02-15 LAB — APTT: aPTT: 27 s (ref 24–36)

## 2024-02-15 LAB — MRSA NEXT GEN BY PCR, NASAL: MRSA by PCR Next Gen: NOT DETECTED

## 2024-02-15 SURGERY — CORONARY/GRAFT ACUTE MI REVASCULARIZATION
Anesthesia: LOCAL

## 2024-02-15 MED ORDER — LIDOCAINE HCL (PF) 1 % IJ SOLN
INTRAMUSCULAR | Status: AC
  Filled 2024-02-15: qty 30

## 2024-02-15 MED ORDER — ROSUVASTATIN CALCIUM 20 MG PO TABS
40.0000 mg | ORAL_TABLET | Freq: Every day | ORAL | Status: DC
  Administered 2024-02-16 – 2024-02-27 (×12): 40 mg via ORAL
  Filled 2024-02-15 (×12): qty 2

## 2024-02-15 MED ORDER — CLOPIDOGREL BISULFATE 300 MG PO TABS
ORAL_TABLET | ORAL | Status: DC | PRN
  Administered 2024-02-15: 600 mg via ORAL

## 2024-02-15 MED ORDER — PANTOPRAZOLE SODIUM 40 MG PO TBEC
40.0000 mg | DELAYED_RELEASE_TABLET | Freq: Every day | ORAL | Status: DC
  Administered 2024-02-16 – 2024-02-27 (×12): 40 mg via ORAL
  Filled 2024-02-15 (×12): qty 1

## 2024-02-15 MED ORDER — HEPARIN SODIUM (PORCINE) 1000 UNIT/ML IJ SOLN
INTRAMUSCULAR | Status: AC
  Filled 2024-02-15: qty 10

## 2024-02-15 MED ORDER — FUROSEMIDE 10 MG/ML IJ SOLN
40.0000 mg | Freq: Once | INTRAMUSCULAR | Status: AC
  Administered 2024-02-15: 40 mg via INTRAVENOUS
  Filled 2024-02-15: qty 4

## 2024-02-15 MED ORDER — MORPHINE SULFATE (PF) 2 MG/ML IV SOLN: 2.0000 mg | INTRAVENOUS | Status: DC | PRN

## 2024-02-15 MED ORDER — DIAZEPAM 5 MG PO TABS
5.0000 mg | ORAL_TABLET | Freq: Three times a day (TID) | ORAL | Status: DC | PRN
  Administered 2024-02-22: 5 mg via ORAL
  Filled 2024-02-15: qty 1

## 2024-02-15 MED ORDER — HYDRALAZINE HCL 20 MG/ML IJ SOLN: 10.0000 mg | INTRAMUSCULAR | Status: AC | PRN

## 2024-02-15 MED ORDER — CLOPIDOGREL BISULFATE 75 MG PO TABS
75.0000 mg | ORAL_TABLET | Freq: Every day | ORAL | Status: DC
  Administered 2024-02-16 – 2024-02-28 (×13): 75 mg via ORAL
  Filled 2024-02-15 (×12): qty 1

## 2024-02-15 MED ORDER — ASPIRIN 81 MG PO CHEW
324.0000 mg | CHEWABLE_TABLET | Freq: Once | ORAL | Status: AC
  Administered 2024-02-15: 324 mg via ORAL
  Filled 2024-02-15: qty 4

## 2024-02-15 MED ORDER — IOHEXOL 350 MG/ML SOLN
INTRAVENOUS | Status: DC | PRN
  Administered 2024-02-15: 145 mL via INTRA_ARTERIAL

## 2024-02-15 MED ORDER — FENTANYL CITRATE (PF) 100 MCG/2ML IJ SOLN
INTRAMUSCULAR | Status: AC
  Filled 2024-02-15: qty 2

## 2024-02-15 MED ORDER — VERAPAMIL HCL 2.5 MG/ML IV SOLN
INTRAVENOUS | Status: AC
  Filled 2024-02-15: qty 2

## 2024-02-15 MED ORDER — NITROGLYCERIN 1 MG/10 ML FOR IR/CATH LAB
INTRA_ARTERIAL | Status: AC
  Filled 2024-02-15: qty 10

## 2024-02-15 MED ORDER — ACETAMINOPHEN 325 MG PO TABS
650.0000 mg | ORAL_TABLET | ORAL | Status: DC | PRN
  Administered 2024-02-19: 650 mg via ORAL
  Filled 2024-02-15: qty 2

## 2024-02-15 MED ORDER — MIDAZOLAM HCL 2 MG/2ML IJ SOLN
INTRAMUSCULAR | Status: AC
  Filled 2024-02-15: qty 2

## 2024-02-15 MED ORDER — HEPARIN (PORCINE) IN NACL 1000-0.9 UT/500ML-% IV SOLN
INTRAVENOUS | Status: DC | PRN
  Administered 2024-02-15 (×2): 500 mL

## 2024-02-15 MED ORDER — SODIUM CHLORIDE 0.9% FLUSH: 3.0000 mL | INTRAVENOUS | Status: DC | PRN

## 2024-02-15 MED ORDER — NITROGLYCERIN 0.4 MG SL SUBL: 0.4000 mg | SUBLINGUAL_TABLET | SUBLINGUAL | Status: DC | PRN

## 2024-02-15 MED ORDER — MIDAZOLAM HCL 2 MG/2ML IJ SOLN
INTRAMUSCULAR | Status: DC | PRN
  Administered 2024-02-15 (×2): 1 mg via INTRAVENOUS

## 2024-02-15 MED ORDER — LABETALOL HCL 5 MG/ML IV SOLN: 10.0000 mg | INTRAVENOUS | Status: AC | PRN

## 2024-02-15 MED ORDER — HEPARIN SODIUM (PORCINE) 1000 UNIT/ML IJ SOLN
INTRAMUSCULAR | Status: DC | PRN
  Administered 2024-02-15: 6000 [IU] via INTRAVENOUS
  Administered 2024-02-15 (×2): 5000 [IU] via INTRAVENOUS
  Administered 2024-02-15: 6000 [IU] via INTRAVENOUS

## 2024-02-15 MED ORDER — VERAPAMIL HCL 2.5 MG/ML IV SOLN
INTRAVENOUS | Status: DC | PRN
  Administered 2024-02-15: 10 mL via INTRA_ARTERIAL

## 2024-02-15 MED ORDER — SODIUM CHLORIDE 0.9% FLUSH
3.0000 mL | Freq: Two times a day (BID) | INTRAVENOUS | Status: DC
  Administered 2024-02-15 – 2024-02-27 (×21): 3 mL via INTRAVENOUS

## 2024-02-15 MED ORDER — SODIUM CHLORIDE 0.9 % IV SOLN
INTRAVENOUS | Status: AC
  Administered 2024-02-15: 10 mL/h via INTRAVENOUS

## 2024-02-15 MED ORDER — SODIUM CHLORIDE 0.9 % IV SOLN: 250.0000 mL | INTRAVENOUS | Status: AC | PRN

## 2024-02-15 MED ORDER — CHLORHEXIDINE GLUCONATE CLOTH 2 % EX PADS
6.0000 | MEDICATED_PAD | Freq: Every day | CUTANEOUS | Status: DC
  Administered 2024-02-15 – 2024-02-27 (×13): 6 via TOPICAL

## 2024-02-15 MED ORDER — HEPARIN (PORCINE) 25000 UT/250ML-% IV SOLN
1250.0000 [IU]/h | INTRAVENOUS | Status: DC
  Administered 2024-02-16: 1250 [IU]/h via INTRAVENOUS
  Filled 2024-02-15: qty 250

## 2024-02-15 MED ORDER — ASPIRIN 81 MG PO TBEC
81.0000 mg | DELAYED_RELEASE_TABLET | Freq: Every day | ORAL | Status: DC
  Administered 2024-02-16 – 2024-02-23 (×8): 81 mg via ORAL
  Filled 2024-02-15 (×8): qty 1

## 2024-02-15 MED ORDER — ORAL CARE MOUTH RINSE: 15.0000 mL | OROMUCOSAL | Status: DC | PRN

## 2024-02-15 MED ORDER — LEVOTHYROXINE SODIUM 50 MCG PO TABS
150.0000 ug | ORAL_TABLET | Freq: Every day | ORAL | Status: DC
  Administered 2024-02-16 – 2024-02-23 (×8): 150 ug via ORAL
  Filled 2024-02-15 (×8): qty 3

## 2024-02-15 MED ORDER — FENTANYL CITRATE (PF) 100 MCG/2ML IJ SOLN
INTRAMUSCULAR | Status: DC | PRN
  Administered 2024-02-15 (×2): 25 ug via INTRAVENOUS

## 2024-02-15 MED ORDER — CLOPIDOGREL BISULFATE 300 MG PO TABS
ORAL_TABLET | ORAL | Status: AC
  Filled 2024-02-15: qty 2

## 2024-02-15 MED ORDER — HEPARIN SODIUM (PORCINE) 5000 UNIT/ML IJ SOLN
4000.0000 [IU] | Freq: Once | INTRAMUSCULAR | Status: AC
  Administered 2024-02-15: 4000 [IU] via INTRAVENOUS
  Filled 2024-02-15: qty 1

## 2024-02-15 MED ORDER — LIDOCAINE HCL (PF) 1 % IJ SOLN
INTRAMUSCULAR | Status: DC | PRN
  Administered 2024-02-15: 2 mL

## 2024-02-15 SURGICAL SUPPLY — 18 items
BALLOON EMERGE MR 2.0X15 (BALLOONS) IMPLANT
BALLOON SAPPHIRE NC24 3.0X15 (BALLOONS) IMPLANT
BALLOON ~~LOC~~ EMERGE MR 3.25X12 (BALLOONS) IMPLANT
CATH 5FR JL3.5 JR4 ANG PIG MP (CATHETERS) IMPLANT
CATH INFINITI 5 FR AL2 (CATHETERS) IMPLANT
CATH INFINITI 5FR JL4 (CATHETERS) IMPLANT
CATH VISTA GUIDE 6FR XBLAD4 (CATHETERS) IMPLANT
DEVICE RAD COMP TR BAND LRG (VASCULAR PRODUCTS) IMPLANT
GLIDESHEATH SLEND SS 6F .021 (SHEATH) IMPLANT
GUIDEWIRE INQWIRE 1.5J.035X260 (WIRE) IMPLANT
KIT ENCORE 26 ADVANTAGE (KITS) IMPLANT
PACK CARDIAC CATHETERIZATION (CUSTOM PROCEDURE TRAY) ×1 IMPLANT
SET ATX-X65L (MISCELLANEOUS) IMPLANT
STENT SYNERGY XD 3.0X16 (Permanent Stent) IMPLANT
TUBING CIL FLEX 10 FLL-RA (TUBING) IMPLANT
WIRE EMERALD ST .035X260CM (WIRE) IMPLANT
WIRE HI TORQ WHISPER MS 190CM (WIRE) IMPLANT
WIRE RUNTHROUGH .014X180CM (WIRE) IMPLANT

## 2024-02-15 NOTE — ED Triage Notes (Signed)
 Pt presents to the ED via EMS from home with complaints of SOB x3 days only with exertion. AOx4  EMS Vitals:  BP 126/70 HR 85 R18 O2 98% RA CBG 219

## 2024-02-15 NOTE — H&P (Signed)
 Cardiology Admission History and Physical   Patient ID: Mark Meadows MRN: [id number]; DOB: 19-Nov-1950   Admission date: 02/15/2024  PCP:  Lorella Roles, MD   [facility] Providers Cardiologist:  None        Chief Complaint: Chest pain and shortness of breath  Patient Profile:   Mark Meadows is a 73 y.o. male with no significant past cardiac history who is being seen 02/15/2024 for the evaluation of STEMI.  History of Present Illness:   Mark Meadows presented to [facility] emergency department with shortness of breath that is worsened over the past week.  He has also been experiencing 3 days of substernal chest tightness intermittently.  An EKG shows an evolving anterolateral STEMI and a code STEMI was called.  The patient denies any significant past cardiac problems.  Reports that he had a history of hypertension and hyperthyroidism.  Denies any recent hospitalizations.  Has a history of heavy tobacco use but quit 30 years ago.  Has a history of alcoholism but quit drinking 3 years ago.  He had a remote tonsillectomy but no other surgeries.  He denies any recent URI symptoms or GI symptoms.  No fevers or chills.  Chest pain is minimal at present and primary complaint is now shortness of breath.  Denies orthopnea or PND.  Denies edema or abdominal swelling.   Past Medical History:  Diagnosis Date   Acute blood loss anemia    Alcohol dependence (HCC)    Alcoholic hepatitis    Dysrhythmia    a. 08/2016 -?afib, not clear. b. 09/2017 - official dx atrial flutter.   E. coli UTI 09/22/2016   GERD (gastroesophageal reflux disease)    Gout    Hematochezia    Hyperglycemia    Hypertension    Hypokalemia    Hypothyroidism    Thrombocytopenia (HCC)     Past Surgical History:  Procedure Laterality Date   COLONOSCOPY N/A 08/26/2016   Procedure: COLONOSCOPY;  Surgeon: Asencion Blacksmith, MD;  Location: [facility] ENDOSCOPY;  Service: Endoscopy;  Laterality: N/A;   TONSILLECTOMY  1957      Medications Prior to Admission: Prior to Admission medications   Medication Sig Start Date End Date Taking? Authorizing Provider  acetaminophen  (TYLENOL ) 500 MG tablet Take 500 mg by mouth every 6 (six) hours as needed for mild pain.    [provider]  amLODipine  (NORVASC ) 10 MG tablet Take 1 tablet (10 mg total) by mouth daily. 08/27/16   Feliciana Horn, MD  folic acid  (FOLVITE ) 1 MG tablet Take 1 tablet (1 mg total) by mouth daily. Patient not taking: Reported on 10/02/2017 08/28/16   Akula, Vijaya, MD  levothyroxine  (SYNTHROID , LEVOTHROID) 300 MCG tablet Take 1 tablet (300 mcg total) by mouth daily before breakfast. 10/07/17   Winfrey, Lavena Posner, MD  metoprolol  succinate (TOPROL -XL) 100 MG 24 hr tablet Take 1 tablet (100 mg total) by mouth daily. Take with or immediately following a meal. 10/08/17   Winfrey, Lavena Posner, MD  omeprazole (PRILOSEC) 20 MG capsule Take 20 mg by mouth daily.    [provider]  tamsulosin  (FLOMAX ) 0.4 MG CAPS capsule Take 1 capsule (0.4 mg total) by mouth daily after supper. Patient not taking: Reported on 10/02/2017 09/23/16   Rizwan, Saima, MD  thiamine  100 MG tablet Take 1 tablet (100 mg total) by mouth daily. Patient not taking: Reported on 10/02/2017 08/28/16   Akula, Vijaya, MD  vitamin B-12 1000 MCG tablet Take 1 tablet (1,000 mcg  total) by mouth daily. 10/08/17   Ronna Coho, MD     Allergies:   No Known Allergies  Social History:   Social History   Socioeconomic History   Marital status: Widowed    Spouse name: Not on file   Number of children: Not on file   Years of education: Not on file   Highest education level: Not on file  Occupational History   Occupation: retired in 3/17  Tobacco Use   Smoking status: Former    Current packs/day: 0.00    Average packs/day: 3.0 packs/day for 12.0 years (36.0 ttl pk-yrs)    Types: Cigarettes    Start date: 9    Quit date: 1980    Years since quitting: 45.4   Smokeless tobacco: Never   Substance and Sexual Activity   Alcohol use: Yes    Alcohol/week: 80.0 standard drinks of alcohol    Types: 80 Shots of liquor per week    Comment: 09/02/2016 "was at 3 large bottles of cheap bourbon/week; cut down to 2 bottles"   Drug use: Yes    Types: Marijuana    Comment: 09/02/2016 "tried marijuana 2 times; years ago"   Sexual activity: Not Currently  Other Topics Concern   Not on file  Social History Narrative   Not on file   Social Drivers of Health   Financial Resource Strain: Low Risk  (12/17/2022)   Received from [facility]   Overall Financial Resource Strain (CARDIA)    Difficulty of Paying Living Expenses: Not hard at all  Food Insecurity: No Food Insecurity (12/17/2022)   Received from [facility]   Hunger Vital Sign    Worried About Running Out of Food in the Last Year: Never true    Ran Out of Food in the Last Year: Never true  Transportation Needs: No Transportation Needs (12/17/2022)   Received from [facility] - Transportation    Lack of Transportation (Medical): No    Lack of Transportation (Non-Medical): No  Physical Activity: Inactive (02/07/2022)   Received from [facility], [facility]   Exercise Vital Sign    Days of Exercise per Week: 0 days    Minutes of Exercise per Session: 0 min  Stress: No Stress Concern Present (02/07/2022)   Received from [facility] - Occupational Stress Questionnaire    Feeling of Stress : Not at all  Social Connections: Unknown (02/09/2023)   Received from [facility]   Social Network    Social Network: Not on file  Intimate Partner Violence: Unknown (02/09/2023)   Received from [facility]   HITS    Physically Hurt: Not on file    Insult or Talk Down To: Not on file    Threaten Physical Harm: Not on file    Scream or Curse: Not on file    Family History:   The patient's family history includes Heart disease in his father; Thyroid  disease in his mother.    ROS:  Please see the history of present illness.  All other ROS reviewed and negative.     Physical Exam/Data:   Vitals:   02/15/24 1832 02/15/24 1854 02/15/24 1923  BP: (!) 124/90    Pulse: (!) 105    Resp: 17    Temp: 98.5 F (36.9 C)    SpO2: 97%  94%  Weight:  105.7 kg   Height:  5\' 8"  (1.727 m)  No intake or output data in the 24 hours ending 02/15/24 2051    02/15/2024    6:54 PM 10/05/2017    4:45 PM 10/03/2017    4:45 AM  Last 3 Weights  Weight (lbs) 233 lb 220 lb 212 lb 8.4 oz  Weight (kg) 105.688 kg 99.791 kg 96.4 kg     Body mass index is 35.43 kg/m.  General:  Well nourished, well developed, in no acute distress HEENT: normal Neck: no JVD Vascular: No carotid bruits; Distal pulses 2+ bilaterally   Cardiac:  normal S1, S2; RRR with a 2/6 crescendo decrescendo murmur heard throughout the precordium with diminished A2 Lungs:  clear to auscultation bilaterally, no wheezing, rhonchi or rales  Abd: soft, nontender, no hepatomegaly  Ext: no edema Musculoskeletal:  No deformities, BUE and BLE strength normal and equal Skin: warm and dry  Neuro:  CNs 2-12 intact, no focal abnormalities noted Psych:  Normal affect    EKG:  The ECG that was done today was personally reviewed and demonstrates sinus tachycardia with evolving anterolateral STEMI pattern  Relevant CV Studies: Pending  Laboratory Data:  High Sensitivity Troponin:   Recent Labs  Lab 02/15/24 1842  TROPONINIHS >24,000*      Chemistry Recent Labs  Lab 02/15/24 1842  NA 136  K 3.6  CL 102  CO2 22  GLUCOSE 184*  BUN 26*  CREATININE 1.57*  CALCIUM 9.6  GFRNONAA 46*  ANIONGAP 12    Recent Labs  Lab 02/15/24 1842  PROT 7.8  ALBUMIN 3.2*  AST 115*  ALT 21  ALKPHOS 56  BILITOT 1.1   Lipids  Recent Labs  Lab 02/15/24 1847  CHOL 92  TRIG 67  HDL 21*  LDLCALC 58  CHOLHDL 4.4   Hematology Recent Labs  Lab 02/15/24 1842  WBC 14.5*  RBC 3.49*  HGB  8.2*  HCT 29.5*  MCV 84.5  MCH 23.5*  MCHC 27.8*  RDW 18.4*  PLT 356   Thyroid No results for input(s): "TSH", "FREET4" in the last 168 hours. BNPNo results for input(s): "BNP", "PROBNP" in the last 168 hours.  DDimer No results for input(s): "DDIMER" in the last 168 hours.   Radiology/Studies:  CARDIAC CATHETERIZATION Result Date: 02/15/2024   Prox LAD to Mid LAD lesion is 100% stenosed.   A drug-eluting stent was successfully placed using a STENT SYNERGY XD 3.0X16.   Post intervention, there is a 0% residual stenosis.   LV end diastolic pressure is severely elevated.   The left ventricular ejection fraction is 25-35% by visual estimate.   Recommend uninterrupted dual antiplatelet therapy with Aspirin  81mg  daily and Clopidogrel 75mg  daily for a minimum of 12 months (ACS-Class I recommendation). 1.  Acute, late presenting STEMI involving the LAD, treated successfully with PCI using PTCA followed by stenting of the proximal LAD with baseline TIMI 0 flow, restored to TIMI-3 flow at the completion of the procedure 2.  Mild nonobstructive plaquing in the left main, left circumflex, and RCA with a heavily calcified RCA 3.  Severe segmental LV dysfunction with akinesis of the entire anterolateral wall, apex, and inferoapex, LVEF estimated at approximately 25 to 30% 4.  Angiographic findings on ventriculography suspicious for LV apical thrombus 5.  Severely elevated LVEDP Recommendations: DAPT with aspirin  and clopidogrel minimum of 12 months, check 2D echo with contrast to evaluate for LV apical thrombus and accurately assess LVEF, post MI medical therapy, IV diuresis with furosemide in the setting of high LVEDP (acute systolic  heart failure secondary to acute anterior infarct).  Institute GDMT as tolerated.   DG Chest Port 1 View Result Date: 02/15/2024 CLINICAL DATA:  Shortness of breath on exertion x3 days. EXAM: PORTABLE CHEST 1 VIEW COMPARISON:  October 02, 2017 FINDINGS: The cardiac silhouette is  enlarged and unchanged in size. Moderate severity infiltrates are seen within the bilateral lung bases. No pleural effusion or pneumothorax is identified. There is a large hiatal hernia. Multiple chronic bilateral rib fractures are seen. No acute osseous abnormalities are identified. IMPRESSION: 1. Stable cardiomegaly with moderate severity bibasilar infiltrates. 2. Large hiatal hernia. Electronically Signed   By: Virgle Grime M.D.   On: 02/15/2024 19:10     Assessment and Plan:   Anterolateral STEMI, late presentation based on duration of symptoms and EKG appearance with Q waves across the precordial leads and involving T wave changes with persistent ST elevation Suspected aortic stenosis based on exam Suspected acute systolic heart failure secondary to #1  The patient will be taken emergently for cardiac catheterization and possible PCI.  His clinical presentation is consistent with a late presenting anterolateral infarct complicated by heart failure.  He has received heparin  and aspirin  in the emergency department.  Further medical therapy and GDMT pending his cardiac catheterization results.  Will check a 2D echocardiogram to assess LV function and evaluate his heart murmur.  He will need aggressive lipid-lowering with high intensity statin drug.   Risk Assessment/Risk Scores:    TIMI Risk Score for ST  Elevation MI:   The patient's TIMI risk score is 8, which indicates a 26.8% risk of all cause mortality at 30 days.   [place]  Heart Association (NYHA) Functional Class NYHA Class III    Code Status: Full Code  Severity of Illness: The appropriate patient status for this patient is INPATIENT. Inpatient status is judged to be reasonable and necessary in order to provide the required intensity of service to ensure the patient's safety. The patient's presenting symptoms, physical exam findings, and initial radiographic and laboratory data in the context of their chronic comorbidities  is felt to place them at high risk for further clinical deterioration. Furthermore, it is not anticipated that the patient will be medically stable for discharge from the hospital within 2 midnights of admission.   * I certify that at the point of admission it is my clinical judgment that the patient will require inpatient hospital care spanning beyond 2 midnights from the point of admission due to high intensity of service, high risk for further deterioration and high frequency of surveillance required.*   For questions or updates, please contact [facility] Please consult [email or web address] for contact info under     Signed, Arnoldo Lapping, MD  02/15/2024 8:51 PM

## 2024-02-15 NOTE — ED Provider Notes (Signed)
 [place] EMERGENCY DEPARTMENT AT [facility] Provider Note   CSN: [id number] Arrival date & time: 02/15/24  1824     History  Chief Complaint  Patient presents with   Shortness of Breath    Mark Meadows is a 73 y.o. male.  73 year old male presenting with worsening shortness of breath with exertion x 3 days.  Patient has been experiencing worsening shortness of breath with exertion for several days, states that he spends much of the day in the recliner but even just a trip to the bathroom is "exhausting".  He reports intermittent chest pain with exertion, "like a tightness, it does not last".  This is a change from his baseline.  No vomiting, abdominal pain, lower extremity edema.    Shortness of Breath      Home Medications Prior to Admission medications   Medication Sig Start Date End Date Taking? Authorizing Provider  acetaminophen  (TYLENOL ) 500 MG tablet Take 500 mg by mouth every 6 (six) hours as needed for mild pain.    [provider]  amLODipine  (NORVASC ) 10 MG tablet Take 1 tablet (10 mg total) by mouth daily. 08/27/16   Akula, Vijaya, MD  folic acid  (FOLVITE ) 1 MG tablet Take 1 tablet (1 mg total) by mouth daily. Patient not taking: Reported on 10/02/2017 08/28/16   Akula, Vijaya, MD  levothyroxine  (SYNTHROID , LEVOTHROID) 300 MCG tablet Take 1 tablet (300 mcg total) by mouth daily before breakfast. 10/07/17   Winfrey, Lavena Posner, MD  metoprolol  succinate (TOPROL -XL) 100 MG 24 hr tablet Take 1 tablet (100 mg total) by mouth daily. Take with or immediately following a meal. 10/08/17   Winfrey, Lavena Posner, MD  omeprazole (PRILOSEC) 20 MG capsule Take 20 mg by mouth daily.    [provider]  tamsulosin  (FLOMAX ) 0.4 MG CAPS capsule Take 1 capsule (0.4 mg total) by mouth daily after supper. Patient not taking: Reported on 10/02/2017 09/23/16   Rizwan, Saima, MD  thiamine  100 MG tablet Take 1 tablet (100 mg total) by mouth daily. Patient not taking:  Reported on 10/02/2017 08/28/16   Akula, Vijaya, MD  vitamin B-12 1000 MCG tablet Take 1 tablet (1,000 mcg total) by mouth daily. 10/08/17   Ronna Coho, MD      Allergies    Patient has no known allergies.    Review of Systems   Review of Systems  Respiratory:  Positive for shortness of breath.     Physical Exam Updated Vital Signs There were no vitals taken for this visit. Physical Exam Vitals and nursing note reviewed.  HENT:     Head: Normocephalic.  Cardiovascular:     Rate and Rhythm: Tachycardia present.     Heart sounds: Murmur heard.  Pulmonary:     Effort: Pulmonary effort is normal.     Breath sounds: Normal breath sounds.  Abdominal:     Palpations: Abdomen is soft.     Tenderness: There is no abdominal tenderness. There is no guarding.  Musculoskeletal:     Cervical back: Normal range of motion.     Right lower leg: No edema.     Left lower leg: No edema.     Comments: Moves all extremities spontaneously without difficulty  Skin:    Coloration: Skin is pale.  Neurological:     Mental Status: He is alert and oriented to person, place, and time.     ED Results / Procedures / Treatments   Labs (all labs ordered are listed, but  only abnormal results are displayed) Labs Reviewed  I-STAT CG4 LACTIC ACID, ED - Abnormal; Notable for the following components:      Result Value   Lactic Acid, Venous 2.7 (*)    All other components within normal limits  HEMOGLOBIN A1C  CBC WITH DIFFERENTIAL/PLATELET  PROTIME-INR  APTT  COMPREHENSIVE METABOLIC PANEL WITH GFR  LIPID PANEL  TROPONIN I (HIGH SENSITIVITY)    EKG EKG Interpretation Date/Time:  Monday Feb 15 2024 18:36:50 EDT Ventricular Rate:  105 PR Interval:  93 QRS Duration:  101 QT Interval:  402 QTC Calculation: 532 R Axis:   110  Text Interpretation: Sinus or ectopic atrial tachycardia Probable inferior infarct, old Probable anterolateral infarct, acute Prolonged QT interval >>> Acute MI <<<  Confirmed by Russella Courts ([id number]) on 02/15/2024 6:38:34 PM  Radiology No results found.  Procedures Procedures    Medications Ordered in ED Medications  0.9 %  sodium chloride  infusion (has no administration in time range)  heparin  injection 60 Units/kg (has no administration in time range)  aspirin  chewable tablet 324 mg (324 mg Oral Given 02/15/24 1844)    ED Course/ Medical Decision Making/ A&P Clinical Course as of 02/15/24 1901  Mon Feb 15, 2024  1844 Dr Arlester Ladd accepts pt, send to [facility] for cath [SG]    Clinical Course User Index [SG] Teddi Favors, DO                                 Medical Decision Making This patient presents to the ED for concern of shortness of breath, this involves an extensive number of treatment options, and is a complaint that carries with it a high risk of complications and morbidity.  The differential diagnosis includes ACS, stable vs unstable angina, PE, CHF.   Co morbidities that complicate the patient evaluation  History of alcohol use disorder, alcoholic liver disease, hypertension   Additional history obtained:  Additional history obtained from record review External records from outside source obtained and reviewed including recent PCP notes   Lab Tests:  I Ordered, and personally interpreted labs.  The pertinent results include:  lactic 2.7, trop in process upon patient's transfer to [facility]    Imaging Studies ordered:  I ordered imaging studies including CXR  Read pending upon transfer   Cardiac Monitoring: / EKG:  The patient was maintained on a cardiac monitor.  See above for EKG interpretation.    Consultations Obtained:  Dr. Martina Sledge requested consultated with the cardiologist,  and discussed lab and imaging findings as well as pertinent plan - they recommend: admission to [facility] for heart catheterization   Problem List / ED Course / Critical interventions / Medication management  heparin /asa administered for  STEMI    Social Determinants of Health:  Former tobacco abuse, alcohol use, physical inactivity   Test / Admission - Considered:  Patient seen and evaluated by myself and Dr. Martina Sledge, code STEMI activated based on concerning EKG findings, see above for additional lab/imaging results.  Patient was transferred to [facility] for heart catheterization.    Amount and/or Complexity of Data Reviewed Labs: ordered. Radiology: ordered.  Risk OTC drugs. Prescription drug management. Decision regarding hospitalization.           Final Clinical Impression(s) / ED Diagnoses Final diagnoses:  ST elevation myocardial infarction (STEMI), unspecified artery (HCC)    Rx / DC Orders ED Discharge Orders  None         Kendrick Pax, [place] 02/15/24 1905    Teddi Favors, DO 02/15/24 Jacobo Masters

## 2024-02-15 NOTE — Progress Notes (Signed)
 PHARMACY - ANTICOAGULATION CONSULT NOTE  Pharmacy Consult for heparin   Indication: chest pain/ACS  No Known Allergies  Patient Measurements: Height: 5\' 8"  (172.7 cm) Weight: 105.7 kg (233 lb) IBW/kg (Calculated) : 68.4 HEPARIN  DW (KG): 91.6  Vital Signs: Temp: 98.5 F (36.9 C) (05/26 1832) BP: 124/90 (05/26 1832) Pulse Rate: 105 (05/26 1832)  Labs: Recent Labs    02/15/24 1842  HGB 8.2*  HCT 29.5*  PLT 356  APTT 27  LABPROT 15.8*  INR 1.2  CREATININE 1.57*  TROPONINIHS >24,000*    Estimated Creatinine Clearance: 48.6 mL/min (A) (by C-G formula based on SCr of 1.57 mg/dL (H)).   Medical History: Past Medical History:  Diagnosis Date   Acute blood loss anemia    Alcohol dependence (HCC)    Alcoholic hepatitis    Dysrhythmia    a. 08/2016 -?afib, not clear. b. 09/2017 - official dx atrial flutter.   E. coli UTI 09/22/2016   GERD (gastroesophageal reflux disease)    Gout    Hematochezia    Hyperglycemia    Hypertension    Hypokalemia    Hypothyroidism    Thrombocytopenia (HCC)     Medications:  Medications Prior to Admission  Medication Sig Dispense Refill Last Dose/Taking   acetaminophen  (TYLENOL ) 500 MG tablet Take 500 mg by mouth every 6 (six) hours as needed for mild pain.      amLODipine  (NORVASC ) 10 MG tablet Take 1 tablet (10 mg total) by mouth daily. 30 tablet 0    folic acid  (FOLVITE ) 1 MG tablet Take 1 tablet (1 mg total) by mouth daily. (Patient not taking: Reported on 10/02/2017) 30 tablet 0    levothyroxine  (SYNTHROID , LEVOTHROID) 300 MCG tablet Take 1 tablet (300 mcg total) by mouth daily before breakfast. 30 tablet 0    metoprolol  succinate (TOPROL -XL) 100 MG 24 hr tablet Take 1 tablet (100 mg total) by mouth daily. Take with or immediately following a meal. 30 tablet 0    omeprazole (PRILOSEC) 20 MG capsule Take 20 mg by mouth daily.      tamsulosin  (FLOMAX ) 0.4 MG CAPS capsule Take 1 capsule (0.4 mg total) by mouth daily after supper. (Patient  not taking: Reported on 10/02/2017) 30 capsule     thiamine  100 MG tablet Take 1 tablet (100 mg total) by mouth daily. (Patient not taking: Reported on 10/02/2017) 30 tablet 0    vitamin B-12 1000 MCG tablet Take 1 tablet (1,000 mcg total) by mouth daily. 30 tablet 0     Assessment: 73 yo male here with STEMI and s/p cath with DES to the LAD and possible LV thrombus. Pharmacy consulted to dose heparin . No anticoagulants noted PTA  -heparin  to start 2 hours after the TR band is removed   Goal of Therapy:  Heparin  level 0.3-0.7 units/ml Monitor platelets by anticoagulation protocol: Yes   Plan:  -Start heparin  1250 units/hr 2 hours after TR band removal -Heparin  level in 8 hours and daily wth CBC daily  Baxter Limber, PharmD Clinical Pharmacist **Pharmacist phone directory can now be found on [email or web address] ([email or web address]).  Listed under [facility].

## 2024-02-15 NOTE — ED Provider Notes (Signed)
 CRITICAL CARE Performed by: Teddi Favors   Total critical care time: 40 minutes  Critical care time was exclusive of separately billable procedures and treating other patients.  Critical care was necessary to treat or prevent imminent or life-threatening deterioration.  Critical care was time spent personally by me on the following activities: development of treatment plan with patient and/or surrogate as well as nursing, discussions with consultants, evaluation of patient's response to treatment, examination of patient, obtaining history from patient or surrogate, ordering and performing treatments and interventions, ordering and review of laboratory studies, ordering and review of radiographic studies, pulse oximetry and re-evaluation of patient's condition. Cardiac failure    73 yo male hx etoh abuse, htn, aortic stenosis, cognitive impairment, here with chest tightness/exertional dyspnea x3 days. Seems to be worsening. Having chest tightness but no report of chest pain. No palpitations.   6:41 pm I was given EKG shortly after completion, concern for STEMI, assessed pt at bedside, promptly paged cardiology and activated stemi alert  6:44 pm  Callback from Dr Arlester Ladd, accepts pt for txfr  Will send to [facility] for emergent cardiac eval Pt agreeable to plan Transport via care link   EKG Interpretation Date/Time:  Monday Feb 15 2024 18:36:50 EDT Ventricular Rate:  105 PR Interval:  93 QRS Duration:  101 QT Interval:  402 QTC Calculation: 532 R Axis:   110  Text Interpretation: Sinus or ectopic atrial tachycardia Probable inferior infarct, old Probable anterolateral infarct, acute Prolonged QT interval >>> Acute MI <<< Confirmed by Russella Courts ([id number]) on 02/15/2024 6:38:34 PM          Teddi Favors, DO 02/15/24 1911

## 2024-02-15 NOTE — ED Notes (Signed)
 Patient taken to [facility] via carelink

## 2024-02-15 NOTE — ED Notes (Signed)
 Report given to Carelink, ETA 5 mins.

## 2024-02-16 ENCOUNTER — Inpatient Hospital Stay (HOSPITAL_COMMUNITY)

## 2024-02-16 ENCOUNTER — Telehealth (HOSPITAL_COMMUNITY): Payer: Self-pay | Admitting: Pharmacy Technician

## 2024-02-16 ENCOUNTER — Other Ambulatory Visit (HOSPITAL_COMMUNITY): Payer: Self-pay

## 2024-02-16 ENCOUNTER — Other Ambulatory Visit: Payer: Self-pay

## 2024-02-16 ENCOUNTER — Encounter (HOSPITAL_COMMUNITY): Payer: Self-pay | Admitting: Cardiovascular Disease

## 2024-02-16 DIAGNOSIS — L8990 Pressure ulcer of unspecified site, unspecified stage: Secondary | ICD-10-CM | POA: Insufficient documentation

## 2024-02-16 DIAGNOSIS — I5021 Acute systolic (congestive) heart failure: Principal | ICD-10-CM

## 2024-02-16 DIAGNOSIS — I5189 Other ill-defined heart diseases: Secondary | ICD-10-CM | POA: Diagnosis not present

## 2024-02-16 DIAGNOSIS — I2102 ST elevation (STEMI) myocardial infarction involving left anterior descending coronary artery: Secondary | ICD-10-CM | POA: Diagnosis not present

## 2024-02-16 DIAGNOSIS — I513 Intracardiac thrombosis, not elsewhere classified: Secondary | ICD-10-CM

## 2024-02-16 LAB — COOXEMETRY PANEL
Carboxyhemoglobin: 0.8 % (ref 0.5–1.5)
Carboxyhemoglobin: 1.7 % — ABNORMAL HIGH (ref 0.5–1.5)
Carboxyhemoglobin: 1.5 % (ref 0.5–1.5)
Methemoglobin: <0.7 % (ref 0.0–1.5)
Methemoglobin: <0.7 % (ref 0.0–1.5)
Methemoglobin: <0.7 % (ref 0.0–1.5)
O2 Saturation: 28.9 %
O2 Saturation: 34.1 %
O2 Saturation: 51.9 %
Total hemoglobin: 8.6 g/dL — ABNORMAL LOW (ref 12.0–16.0)
Total hemoglobin: 7.8 g/dL — ABNORMAL LOW (ref 12.0–16.0)
Total hemoglobin: 8 g/dL — ABNORMAL LOW (ref 12.0–16.0)

## 2024-02-16 LAB — ECHOCARDIOGRAM COMPLETE
AR max vel: 0.82 cm2
AV Area VTI: 0.81 cm2
AV Area mean vel: 0.65 cm2
AV Mean grad: 16 mmHg
AV Peak grad: 27.2 mmHg
Ao pk vel: 2.61 m/s
Calc EF: 30.6 %
Height: 68 in
MV VTI: 1.81 cm2
S' Lateral: 3.3 cm
Single Plane A2C EF: 22 %
Single Plane A4C EF: 36.5 %
Weight: 3590.85 [oz_av]

## 2024-02-16 LAB — BASIC METABOLIC PANEL WITH GFR
Anion gap: 10 (ref 5–15)
BUN: 23 mg/dL (ref 8–23)
CO2: 23 mmol/L (ref 22–32)
Calcium: 8.4 mg/dL — ABNORMAL LOW (ref 8.9–10.3)
Chloride: 102 mmol/L (ref 98–111)
Creatinine, Ser: 1.49 mg/dL — ABNORMAL HIGH (ref 0.61–1.24)
GFR, Estimated: 49 mL/min — ABNORMAL LOW (ref >60)
Glucose, Bld: 173 mg/dL — ABNORMAL HIGH (ref 70–99)
Potassium: 3.8 mmol/L (ref 3.5–5.1)
Sodium: 135 mmol/L (ref 135–145)

## 2024-02-16 LAB — HEPARIN LEVEL (UNFRACTIONATED): Heparin Unfractionated: <0.1 [IU]/mL — ABNORMAL LOW (ref 0.30–0.70)

## 2024-02-16 LAB — LIPID PANEL
Cholesterol: 86 mg/dL (ref 0–200)
HDL: 18 mg/dL — ABNORMAL LOW (ref >40)
LDL Cholesterol: 55 mg/dL (ref 0–99)
Total CHOL/HDL Ratio: 4.8 ratio
Triglycerides: 67 mg/dL (ref <150)
VLDL: 13 mg/dL (ref 0–40)

## 2024-02-16 LAB — CBC
HCT: 28.5 % — ABNORMAL LOW (ref 39.0–52.0)
Hemoglobin: 8.2 g/dL — ABNORMAL LOW (ref 13.0–17.0)
MCH: 23.6 pg — ABNORMAL LOW (ref 26.0–34.0)
MCHC: 28.8 g/dL — ABNORMAL LOW (ref 30.0–36.0)
MCV: 82.1 fL (ref 80.0–100.0)
Platelets: 360 10*3/uL (ref 150–400)
RBC: 3.47 MIL/uL — ABNORMAL LOW (ref 4.22–5.81)
RDW: 18.8 % — ABNORMAL HIGH (ref 11.5–15.5)
WBC: 15.9 10*3/uL — ABNORMAL HIGH (ref 4.0–10.5)
nRBC: 0.1 % (ref 0.0–0.2)

## 2024-02-16 LAB — CG4 I-STAT (LACTIC ACID): Lactic Acid, Venous: 2.7 mmol/L (ref 0.5–1.9)

## 2024-02-16 MED ORDER — MILRINONE LACTATE IN DEXTROSE 20-5 MG/100ML-% IV SOLN
0.1250 ug/kg/min | INTRAVENOUS | Status: DC
  Administered 2024-02-16 – 2024-02-19 (×6): 0.25 ug/kg/min via INTRAVENOUS
  Administered 2024-02-19 – 2024-02-20 (×2): 0.125 ug/kg/min via INTRAVENOUS
  Filled 2024-02-16 (×9): qty 100

## 2024-02-16 MED ORDER — DIGOXIN 125 MCG PO TABS
0.1250 mg | ORAL_TABLET | Freq: Every day | ORAL | Status: DC
  Administered 2024-02-16 – 2024-02-24 (×9): 0.125 mg via ORAL
  Filled 2024-02-16 (×10): qty 1

## 2024-02-16 MED ORDER — POTASSIUM CHLORIDE CRYS ER 20 MEQ PO TBCR
40.0000 meq | EXTENDED_RELEASE_TABLET | Freq: Once | ORAL | Status: AC
  Administered 2024-02-16: 40 meq via ORAL
  Filled 2024-02-16: qty 2

## 2024-02-16 MED ORDER — HEPARIN (PORCINE) 25000 UT/250ML-% IV SOLN
1350.0000 [IU]/h | INTRAVENOUS | Status: DC
  Administered 2024-02-16 – 2024-02-18 (×3): 1550 [IU]/h via INTRAVENOUS
  Filled 2024-02-16 (×3): qty 250

## 2024-02-16 MED ORDER — PERFLUTREN LIPID MICROSPHERE
1.0000 mL | INTRAVENOUS | Status: AC | PRN
  Administered 2024-02-16: 3 mL via INTRAVENOUS

## 2024-02-16 MED ORDER — SODIUM CHLORIDE 0.9% FLUSH: 10.0000 mL | INTRAVENOUS | Status: DC | PRN

## 2024-02-16 MED ORDER — FUROSEMIDE 10 MG/ML IJ SOLN
40.0000 mg | Freq: Once | INTRAMUSCULAR | Status: AC
  Administered 2024-02-16: 40 mg via INTRAVENOUS
  Filled 2024-02-16: qty 4

## 2024-02-16 MED ORDER — SODIUM CHLORIDE 0.9% FLUSH
10.0000 mL | Freq: Two times a day (BID) | INTRAVENOUS | Status: DC
  Administered 2024-02-16 – 2024-02-27 (×16): 10 mL

## 2024-02-16 MED ORDER — SPIRONOLACTONE 12.5 MG HALF TABLET
12.5000 mg | ORAL_TABLET | Freq: Every day | ORAL | Status: DC
  Administered 2024-02-16: 12.5 mg via ORAL
  Filled 2024-02-16: qty 1

## 2024-02-16 MED ORDER — FUROSEMIDE 10 MG/ML IJ SOLN
80.0000 mg | Freq: Two times a day (BID) | INTRAMUSCULAR | Status: DC
Start: 2024-02-16 — End: 2024-02-17
  Administered 2024-02-16 – 2024-02-17 (×2): 80 mg via INTRAVENOUS
  Filled 2024-02-16 (×2): qty 8

## 2024-02-16 MED ORDER — APIXABAN 5 MG PO TABS: 5.0000 mg | ORAL_TABLET | Freq: Two times a day (BID) | ORAL | Status: DC

## 2024-02-16 NOTE — Telephone Encounter (Signed)
 Pharmacy Patient Advocate Encounter  Insurance verification completed.    The patient is insured through [facility]. Patient has Medicare and is not eligible for a copay card, but may be able to apply for patient assistance or Medicare RX Payment Plan (Patient Must reach out to their plan, if eligible for payment plan), if available.    Ran test claim for Eliquis, farxiga, jardiance, xarelto, entresto and the current 30 day co-pay is $302.00.  Copay applied to the deductible. $47.00 after the deductible.    This test claim was processed through [facility]- copay amounts may vary at other pharmacies due to pharmacy/plan contracts, or as the patient moves through the different stages of their insurance plan.

## 2024-02-16 NOTE — Progress Notes (Addendum)
 Rounding Note    Patient Name: Mark Meadows Date of Encounter: 02/16/2024  [facility] Cardiologist: Arlester Ladd (New)  Subjective   No chest pain this am. Dyspneic when walking to the chair.   Inpatient Medications    Scheduled Meds:  aspirin  EC  81 mg Oral Daily   Chlorhexidine Gluconate Cloth  6 each Topical Daily   clopidogrel  75 mg Oral Q breakfast   levothyroxine   150 mcg Oral Q0600   pantoprazole   40 mg Oral Daily   rosuvastatin  40 mg Oral Daily   sodium chloride  flush  3 mL Intravenous Q12H   Continuous Infusions:  sodium chloride  Stopped (02/15/24 2235)   sodium chloride      heparin  1,250 Units/hr (02/16/24 0400)   PRN Meds: sodium chloride , acetaminophen , diazepam, morphine  injection, nitroGLYCERIN, mouth rinse, sodium chloride  flush   Vital Signs    Vitals:   02/16/24 0420 02/16/24 0500 02/16/24 0600 02/16/24 0700  BP:  109/83 111/79 115/86  Pulse:  (!) 106 (!) 105 (!) 107  Resp:  (!) 35 (!) 27 (!) 31  Temp: 97.6 F (36.4 C)   97.8 F (36.6 C)  TempSrc: Oral   Axillary  SpO2:  93% 99% 94%  Weight:      Height:        Intake/Output Summary (Last 24 hours) at 02/16/2024 0819 Last data filed at 02/16/2024 0500 Gross per 24 hour  Intake 68.66 ml  Output 2900 ml  Net -2831.34 ml      02/15/2024    8:50 PM 02/15/2024    8:45 PM 02/15/2024    6:54 PM  Last 3 Weights  Weight (lbs) 224 lb 6.9 oz 224 lb 6.9 oz 233 lb  Weight (kg) 101.8 kg 101.8 kg 105.688 kg      Telemetry    Sinus - Personally Reviewed  ECG    Sinus tachycardia, anterior ST elevation c/w evolving MI - Personally Reviewed  Physical Exam   GEN: No acute distress.   Neck: No JVD Cardiac: RRR, no murmurs, rubs, or gallops.  Respiratory: Clear to auscultation bilaterally. GI: Soft, nontender, non-distended  MS: No edema; No deformity. Neuro:  Nonfocal  Psych: Normal affect   Labs    High Sensitivity Troponin:   Recent Labs  Lab 02/15/24 1842 02/15/24 2139   TROPONINIHS >24,000* >24,000*     Chemistry Recent Labs  Lab 02/15/24 1842 02/16/24 0604  NA 136 135  K 3.6 3.8  CL 102 102  CO2 22 23  GLUCOSE 184* 173*  BUN 26* 23  CREATININE 1.57* 1.49*  CALCIUM 9.6 8.4*  PROT 7.8  --   ALBUMIN 3.2*  --   AST 115*  --   ALT 21  --   ALKPHOS 56  --   BILITOT 1.1  --   GFRNONAA 46* 49*  ANIONGAP 12 10    Lipids  Recent Labs  Lab 02/16/24 0604  CHOL 86  TRIG 67  HDL 18*  LDLCALC 55  CHOLHDL 4.8    Hematology Recent Labs  Lab 02/15/24 1842 02/16/24 0604  WBC 14.5* 15.9*  RBC 3.49* 3.47*  HGB 8.2* 8.2*  HCT 29.5* 28.5*  MCV 84.5 82.1  MCH 23.5* 23.6*  MCHC 27.8* 28.8*  RDW 18.4* 18.8*  PLT 356 360   Thyroid  Recent Labs  Lab 02/15/24 2139  TSH 6.656*    BNPNo results for input(s): "BNP", "PROBNP" in the last 168 hours.  DDimer No results for input(s): "DDIMER" in  the last 168 hours.   Radiology    CARDIAC CATHETERIZATION Result Date: 02/15/2024   Prox LAD to Mid LAD lesion is 100% stenosed.   A drug-eluting stent was successfully placed using a STENT SYNERGY XD 3.0X16.   Post intervention, there is a 0% residual stenosis.   LV end diastolic pressure is severely elevated.   The left ventricular ejection fraction is 25-35% by visual estimate.   Recommend uninterrupted dual antiplatelet therapy with Aspirin  81mg  daily and Clopidogrel  75mg  daily for a minimum of 12 months (ACS-Class I recommendation). 1.  Acute, late presenting STEMI involving the LAD, treated successfully with PCI using PTCA followed by stenting of the proximal LAD with baseline TIMI 0 flow, restored to TIMI-3 flow at the completion of the procedure 2.  Mild nonobstructive plaquing in the left main, left circumflex, and RCA with a heavily calcified RCA 3.  Severe segmental LV dysfunction with akinesis of the entire anterolateral wall, apex, and inferoapex, LVEF estimated at approximately 25 to 30% 4.  Angiographic findings on ventriculography suspicious for  LV apical thrombus 5.  Severely elevated LVEDP Recommendations: DAPT with aspirin  and clopidogrel  minimum of 12 months, check 2D echo with contrast to evaluate for LV apical thrombus and accurately assess LVEF, post MI medical therapy, IV diuresis with furosemide  in the setting of high LVEDP (acute systolic heart failure secondary to acute anterior infarct).  Institute GDMT as tolerated.   DG Chest Port 1 View Result Date: 02/15/2024 CLINICAL DATA:  Shortness of breath on exertion x3 days. EXAM: PORTABLE CHEST 1 VIEW COMPARISON:  October 02, 2017 FINDINGS: The cardiac silhouette is enlarged and unchanged in size. Moderate severity infiltrates are seen within the bilateral lung bases. No pleural effusion or pneumothorax is identified. There is a large hiatal hernia. Multiple chronic bilateral rib fractures are seen. No acute osseous abnormalities are identified. IMPRESSION: 1. Stable cardiomegaly with moderate severity bibasilar infiltrates. 2. Large hiatal hernia. Electronically Signed   By: Virgle Grime M.D.   On: 02/15/2024 19:10    Cardiac Studies     Patient Profile     73 y.o. male with history of HTN, HLD and prior tobacco abuse admitted with a late presenting anterior STEMI on 02/15/24. Cardiac cath with occluded mid LAD treated with a drug eluting stent. LV gram with severe LV systolic dysfunction with akinesis of the anterior wall and apex, possible LV thrombus.   Assessment & Plan    CAD/anterior STEMI: Pt admitted 02/15/24 with an anterior STEMI secondary to occlusion of the mid LAD. One drug eluting stent was placed. Mild non-obstructive disease in the RCA and Circumflex. No chest pain this am. Will continue DAPT with ASA and Plavix  for one year (Plavix  chosen as he will need long term anti-coagulation due to possible LV thrombus). Continue high intensity statin. Echo later today.  Severe LV dysfunction/Acute systolic CHF: Anterior wall akinesis secondary to acute MI with late  presentation to the hospital. Echo today to further define LVEF but by visual estimate his LVEF is below 30% on his LV gram. Will add GDMT as tolerated today. Will give one dose of IV Lasix  this am. AHF team to see also. Will likely need Lifevest at d/c.  LV thrombus: High suspicion for apical thrombus on LV gram. Echo today with Definity  to further assess. Continue IV heparin  today. Should be ok to transition to Eliquis  later today.  HTN: BP is stable. HLD: Continue high intensity statin.   For questions or updates, please contact  [facility] Please consult [email or web address] for contact info under        Signed, Antoinette Batman, MD  02/16/2024, 8:19 AM

## 2024-02-16 NOTE — Progress Notes (Signed)
*  PRELIMINARY RESULTS* Echocardiogram 2D Echocardiogram has been performed.  Mark Meadows 02/16/2024, 9:47 AM

## 2024-02-16 NOTE — TOC Initial Note (Signed)
 Transition of Care [facility]) - Initial/Assessment Note    Patient Details  Name: Mark Meadows MRN: [id number] Date of Birth: 08/24/1951  Transition of Care [facility]) CM/SW Contact:    Benjiman Bras, RN Phone Number: [phone] 02/16/2024, 5:57 PM  Clinical Narrative:                  TOC CM spoke to pt and states he lives alone, [place] he has reached out to his dtr to see if she can assist him at home. Pt states he will need Rollator for home. Discussed HH and agreeable if needed. Will continue to follow for dc needs.  Expected Discharge Plan: Home w Home Health Services Barriers to Discharge: Continued Medical Work up   Patient Goals and CMS Choice Patient states their goals for this hospitalization and ongoing recovery are:: wants to get better CMS Medicare.gov Compare Post Acute Care list provided to:: Patient Choice offered to / list presented to : Patient      Expected Discharge Plan and Services   Discharge Planning Services: CM Consult Post Acute Care Choice: Home Health Living arrangements for the past 2 months: Single Family Home                                      Prior Living Arrangements/Services Living arrangements for the past 2 months: Single Family Home Lives with:: Self, Pets Patient language and need for interpreter reviewed:: Yes Do you feel safe going back to the place where you live?: Yes      Need for Family Participation in Patient Care: No (Comment) Care giver support system in place?: No (comment) Current home services: DME (rolling walker, scale) Criminal Activity/Legal Involvement Pertinent to Current Situation/Hospitalization: No - Comment as needed  Activities of Daily Living   ADL Screening (condition at time of admission) Independently performs ADLs?: Yes (appropriate for developmental age) Is the patient deaf or have difficulty hearing?: No Does the patient have difficulty seeing, even when wearing glasses/contacts?: No Does  the patient have difficulty concentrating, remembering, or making decisions?: No  Permission Sought/Granted Permission sought to share information with : Case Manager, PCP, Family Supports Permission granted to share information with : Yes, Verbal Permission Granted  Share Information with NAME: [name]  Permission granted to share info w AGENCY: Home Health, DME, PCP  Permission granted to share info w Relationship: brother in law  Permission granted to share info w Contact Information: [phone]  Emotional Assessment Appearance:: Appears stated age Attitude/Demeanor/Rapport: Engaged Affect (typically observed): Accepting Orientation: : Oriented to Self, Oriented to  Time, Oriented to Place, Oriented to Situation   Psych Involvement: No (comment)  Admission diagnosis:  ST elevation myocardial infarction (STEMI), unspecified artery (HCC) [I21.3] STEMI involving left anterior descending coronary artery (HCC) [I21.02] Patient Active Problem List   Diagnosis Date Noted   STEMI involving left anterior descending coronary artery (HCC) 02/15/2024   Atrial flutter (HCC) 10/05/2017   Cognitive impairment 10/05/2017   Hypomagnesemia 10/05/2017   Alcohol abuse    Delirium tremens (HCC)    Alcohol withdrawal (HCC) 10/02/2017   Tachycardia    Hematemesis    Macrocytic anemia with vitamin B12 deficiency 09/25/2016   Severe sepsis (HCC) 09/23/2016   Bacteremia 09/22/2016   Acute encephalopathy    Palliative care encounter    Goals of care, counseling/discussion    Elevated troponin 09/17/2016   Prolonged Q-T  interval on ECG 09/03/2016   Non-traumatic rhabdomyolysis 09/03/2016   Effusion of left knee 09/03/2016   Weakness 09/02/2016   A-fib (HCC), possible 09/02/2016   Hematochezia    Rectal bleeding 08/22/2016   Hypothyroidism 08/22/2016   Alcohol use disorder, severe, dependence (HCC) 08/22/2016   Alcoholic liver disease (HCC) 08/22/2016   Hypokalemia 08/22/2016    Hyperglycemia 08/22/2016   Thrombocytopenia (HCC) 08/22/2016   Acute blood loss anemia 08/22/2016   Hypertension    PCP:  Lorella Roles, MD Pharmacy:   [facility] [id number] - [place], [place] - [address] [place] [address] Phone: [phone] Fax: [phone]  [id number] - [place], [place] - [address] AT [facility] [address] [place] [address] Phone: [phone] Fax: [phone]     Social Drivers of Health (SDOH) Social History: SDOH Screenings   Food Insecurity: No Food Insecurity (02/15/2024)  Housing: Low Risk  (02/15/2024)  Transportation Needs: No Transportation Needs (02/15/2024)  Utilities: Not At Risk (02/15/2024)  Financial Resource Strain: Low Risk  (12/17/2022)   Received from [facility]  Physical Activity: Inactive (02/07/2022)   Received from [facility], [facility]  Social Connections: Moderately Isolated (02/15/2024)  Stress: No Stress Concern Present (02/07/2022)   Received from [facility], [facility]  Tobacco Use: Medium Risk (02/16/2024)   SDOH Interventions:     Readmission Risk Interventions     No data to display

## 2024-02-16 NOTE — Progress Notes (Signed)
 Peripherally Inserted Central Catheter Placement  The IV Nurse has discussed with the patient and/or persons authorized to consent for the patient, the purpose of this procedure and the potential benefits and risks involved with this procedure.  The benefits include less needle sticks, lab draws from the catheter, and the patient may be discharged home with the catheter. Risks include, but not limited to, infection, bleeding, blood clot (thrombus formation), and puncture of an artery; nerve damage and irregular heartbeat and possibility to perform a PICC exchange if needed/ordered by physician.  Alternatives to this procedure were also discussed.  Bard Power PICC patient education guide, fact sheet on infection prevention and patient information card has been provided to patient /or left at bedside.    PICC Placement Documentation  PICC Double Lumen 02/16/24 Right Brachial 44 cm 0 cm (Active)  Indication for Insertion or Continuance of Line Vasoactive infusions 02/16/24 1634  Exposed Catheter (cm) 0 cm 02/16/24 1634  Site Assessment Clean, Dry, Intact 02/16/24 1634  Lumen #1 Status Flushed;Blood return noted;Saline locked 02/16/24 1634  Lumen #2 Status Flushed;Blood return noted;Saline locked 02/16/24 1634  Dressing Type Transparent 02/16/24 1634  Dressing Status Antimicrobial disc/dressing in place 02/16/24 1634  Line Care Connections checked and tightened 02/16/24 1634  Line Adjustment (NICU/IV Team Only) No 02/16/24 1634  Dressing Intervention New dressing 02/16/24 1634  Dressing Change Due 02/23/24 02/16/24 1634       Louvella Royalty 02/16/2024, 4:36 PM

## 2024-02-16 NOTE — Consult Note (Addendum)
 Advanced Heart Failure Team Consult Note   Primary Physician: Lorella Roles, MD Cardiologist:  None  Reason for Consultation: Heart Failure   HPI:    Mark Meadows is seen today for evaluation of heart failure  at the request of Dr Abel Hoe.   Mr Rezabek is a 73 year old with a history of HTN, HLD, hypothyroid, and tobacco abuse. No previous history of coronary disease. Over the last few months he has had functional decline with progressive fatigue and shortness of breath.   Admitted with late presenting MI.  Underwent cath with occlusion to mild LAD with DES, mild  nonobstructive RCA/mid circumflex. LVEDP increased. LV gram  ~ 30% and suspected  LV thrombus. Placed on heparin  drip, IV lasix  and DAPT.   Echo today- LV 15-20%, suspected LV thrombus, RV ok, and Severe AS.   Home Medications Prior to Admission medications   Medication Sig Start Date End Date Taking? Authorizing Provider  acetaminophen  (TYLENOL ) 500 MG tablet Take 500-1,000 mg by mouth every 6 (six) hours as needed for mild pain (pain score 1-3).   Yes [provider]  Cholecalciferol (D3 5000 PO) Take 5,000 Units by mouth daily.   Yes [provider]  Cyanocobalamin  (B-12 PO) Take 1 tablet by mouth daily.   Yes [provider]  Ferrous Sulfate (IRON  PO) Take 1 tablet by mouth daily.   Yes [provider]  ibuprofen (ADVIL) 200 MG tablet Take 200-800 mg by mouth every 6 (six) hours as needed for moderate pain (pain score 4-6).   Yes [provider]  levothyroxine  (SYNTHROID ) 150 MCG tablet Take 150-300 mcg by mouth See admin instructions. Take 150mcg (1 tablet) by mouth 5 days a week, then take 300mcg (2 tablets) 2 days a week.   Yes [provider]  MAGNESIUM  PO Take 1 tablet by mouth daily.   Yes [provider]    Past Medical History: Past Medical History:  Diagnosis Date   Acute blood loss anemia    Alcohol dependence (HCC)    Alcoholic hepatitis     Dysrhythmia    a. 08/2016 -?afib, not clear. b. 09/2017 - official dx atrial flutter.   E. coli UTI 09/22/2016   GERD (gastroesophageal reflux disease)    Gout    Hematochezia    Hyperglycemia    Hypertension    Hypokalemia    Hypothyroidism    Thrombocytopenia (HCC)     Past Surgical History: Past Surgical History:  Procedure Laterality Date   COLONOSCOPY N/A 08/26/2016   Procedure: COLONOSCOPY;  Surgeon: Asencion Blacksmith, MD;  Location: [facility] ENDOSCOPY;  Service: Endoscopy;  Laterality: N/A;   CORONARY/GRAFT ACUTE MI REVASCULARIZATION N/A 02/15/2024   Procedure: Coronary/Graft Acute MI Revascularization;  Surgeon: Arnoldo Lapping, MD;  Location: [facility] INVASIVE CV LAB;  Service: Cardiovascular;  Laterality: N/A;   LEFT HEART CATH AND CORONARY ANGIOGRAPHY N/A 02/15/2024   Procedure: LEFT HEART CATH AND CORONARY ANGIOGRAPHY;  Surgeon: Arnoldo Lapping, MD;  Location[facility] INVASIVE CV LAB;  Service: Cardiovascular;  Laterality: N/A;   TONSILLECTOMY  1957    Family History: Family History  Problem Relation Age of Onset   Thyroid disease Mother    Heart disease Father     Social History: Social History   Socioeconomic History   Marital status: Widowed    Spouse name: Not on file   Number of children: Not on file   Years of education: Not on file   Highest education level: Not  on file  Occupational History   Occupation: retired in 3/17  Tobacco Use   Smoking status: Former    Current packs/day: 0.00    Average packs/day: 3.0 packs/day for 12.0 years (36.0 ttl pk-yrs)    Types: Cigarettes    Start date: 84    Quit date: 1980    Years since quitting: 45.4   Smokeless tobacco: Never  Substance and Sexual Activity   Alcohol use: Yes    Alcohol/week: 80.0 standard drinks of alcohol    Types: 80 Shots of liquor per week    Comment: 09/02/2016 "was at 3 large bottles of cheap bourbon/week; cut down to 2 bottles"   Drug use: Yes    Types: Marijuana    Comment: 09/02/2016 "tried  marijuana 2 times; years ago"   Sexual activity: Not Currently  Other Topics Concern   Not on file  Social History Narrative   Not on file   Social Drivers of Health   Financial Resource Strain: Low Risk  (12/17/2022)   Received from [facility]   Overall Financial Resource Strain (CARDIA)    Difficulty of Paying Living Expenses: Not hard at all  Food Insecurity: No Food Insecurity (02/15/2024)   Hunger Vital Sign    Worried About Running Out of Food in the Last Year: Never true    Ran Out of Food in the Last Year: Never true  Transportation Needs: No Transportation Needs (02/15/2024)   PRAPARE - Administrator, Civil Service (Medical): No    Lack of Transportation (Non-Medical): No  Physical Activity: Inactive (02/07/2022)   Received from [facility]   Exercise Vital Sign    Days of Exercise per Week: 0 days    Minutes of Exercise per Session: 0 min  Stress: No Stress Concern Present (02/07/2022)   Received from [facility] - Occupational Stress Questionnaire    Feeling of Stress : Not at all  Social Connections: Moderately Isolated (02/15/2024)   Social Connection and Isolation Panel [NHANES]    Frequency of Communication with Friends and Family: Twice a week    Frequency of Social Gatherings with Friends and Family: Twice a week    Attends Religious Services: Never    Database administrator or Organizations: No    Attends Engineer, structural: More than 4 times per year    Marital Status: Never married    Allergies:  No Known Allergies  Objective:    Vital Signs:   Temp:  [97.6 F (36.4 C)-98.6 F (37 C)] 97.8 F (36.6 C) (05/27 0700) Pulse Rate:  [103-107] 104 (05/27 0951) Resp:  [15-36] 17 (05/27 0951) BP: (106-132)/(79-110) 111/86 (05/27 0951) SpO2:  [93 %-100 %] 99 % (05/27 0951) FiO2 (%):  [21 %] 21 % (05/26 2122) Weight:  [101.8 kg-105.7 kg] 101.8 kg (05/26  2050)    Weight change: Filed Weights   02/15/24 1854 02/15/24 2045 02/15/24 2050  Weight: 105.7 kg 101.8 kg 101.8 kg    Intake/Output:   Intake/Output Summary (Last 24 hours) at 02/16/2024 1130 Last data filed at 02/16/2024 0951 Gross per 24 hour  Intake 130.81 ml  Output 3150 ml  Net -3019.19 ml      Physical Exam    General:  HEENT: normal Neck: supple. JVP 9-10 . Carotids 2+ bilat; Cor: PMI nondisplaced. Tach Regular rate & rhythm. No rubs, gallops . RUSB  murmurs. Lungs: clear Abdomen: soft, nontender,  nondistended.  Extremities: no cyanosis, clubbing, rash, edema Neuro: alert & orientedx3, cranial nerves grossly intact. moves all 4 extremities w/o difficulty. Affect pleasant   Telemetry   ST   EKG    Labs   Basic Metabolic Panel: Recent Labs  Lab 02/15/24 1842 02/16/24 0604  NA 136 135  K 3.6 3.8  CL 102 102  CO2 22 23  GLUCOSE 184* 173*  BUN 26* 23  CREATININE 1.57* 1.49*  CALCIUM 9.6 8.4*    Liver Function Tests: Recent Labs  Lab 02/15/24 1842  AST 115*  ALT 21  ALKPHOS 56  BILITOT 1.1  PROT 7.8  ALBUMIN 3.2*   No results for input(s): "LIPASE", "AMYLASE" in the last 168 hours. No results for input(s): "AMMONIA" in the last 168 hours.  CBC: Recent Labs  Lab 02/15/24 1842 02/16/24 0604  WBC 14.5* 15.9*  NEUTROABS 10.9*  --   HGB 8.2* 8.2*  HCT 29.5* 28.5*  MCV 84.5 82.1  PLT 356 360    Cardiac Enzymes: No results for input(s): "CKTOTAL", "CKMB", "CKMBINDEX", "TROPONINI" in the last 168 hours.  BNP: BNP (last 3 results) No results for input(s): "BNP" in the last 8760 hours.  ProBNP (last 3 results) No results for input(s): "PROBNP" in the last 8760 hours.   CBG: No results for input(s): "GLUCAP" in the last 168 hours.  Coagulation Studies: Recent Labs    02/15/24 1842  LABPROT 15.8*  INR 1.2     Imaging   CARDIAC CATHETERIZATION Result Date: 02/15/2024   Prox LAD to Mid LAD lesion is 100% stenosed.   A  drug-eluting stent was successfully placed using a STENT SYNERGY XD 3.0X16.   Post intervention, there is a 0% residual stenosis.   LV end diastolic pressure is severely elevated.   The left ventricular ejection fraction is 25-35% by visual estimate.   Recommend uninterrupted dual antiplatelet therapy with Aspirin  81mg  daily and Clopidogrel 75mg  daily for a minimum of 12 months (ACS-Class I recommendation). 1.  Acute, late presenting STEMI involving the LAD, treated successfully with PCI using PTCA followed by stenting of the proximal LAD with baseline TIMI 0 flow, restored to TIMI-3 flow at the completion of the procedure 2.  Mild nonobstructive plaquing in the left main, left circumflex, and RCA with a heavily calcified RCA 3.  Severe segmental LV dysfunction with akinesis of the entire anterolateral wall, apex, and inferoapex, LVEF estimated at approximately 25 to 30% 4.  Angiographic findings on ventriculography suspicious for LV apical thrombus 5.  Severely elevated LVEDP Recommendations: DAPT with aspirin  and clopidogrel minimum of 12 months, check 2D echo with contrast to evaluate for LV apical thrombus and accurately assess LVEF, post MI medical therapy, IV diuresis with furosemide in the setting of high LVEDP (acute systolic heart failure secondary to acute anterior infarct).  Institute GDMT as tolerated.   DG Chest Port 1 View Result Date: 02/15/2024 CLINICAL DATA:  Shortness of breath on exertion x3 days. EXAM: PORTABLE CHEST 1 VIEW COMPARISON:  October 02, 2017 FINDINGS: The cardiac silhouette is enlarged and unchanged in size. Moderate severity infiltrates are seen within the bilateral lung bases. No pleural effusion or pneumothorax is identified. There is a large hiatal hernia. Multiple chronic bilateral rib fractures are seen. No acute osseous abnormalities are identified. IMPRESSION: 1. Stable cardiomegaly with moderate severity bibasilar infiltrates. 2. Large hiatal hernia. Electronically Signed    By: Virgle Grime M.D.   On: 02/15/2024 19:10     Medications:  Current Medications:  aspirin  EC  81 mg Oral Daily   Chlorhexidine Gluconate Cloth  6 each Topical Daily   clopidogrel  75 mg Oral Q breakfast   levothyroxine   150 mcg Oral Q0600   pantoprazole   40 mg Oral Daily   rosuvastatin  40 mg Oral Daily   sodium chloride  flush  3 mL Intravenous Q12H    Infusions:  sodium chloride  Stopped (02/15/24 2235)   sodium chloride      heparin  1,250 Units/hr (02/16/24 0900)      Patient Profile  Mr Strauch is a 73 year old with a history of HTN, HLD, and tobacco abuse. Admitted with late presenting MI.    Assessment/Plan  1. STEMI, Antrerior HS Trop >24K. Cath DES  Prox-Mid LAD. 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  Plan DAPT x 1 year. ASA + Plavix. Continue crestor 40 mg daily  - No chest pain.  - Order Life Vest   2. Acute HFrEF, ICM +Valvular Disease Echo EF 30% on admit. Echo today EF 15-20% RV normal with severe AS. Tachycardic today.  Hold off on bb. Check lactic acid.  - Diuresing with IV lasix.  Increase lasix  80 mg IV twice a day . Supp K  - Will need to slowly add GDMT - Add digoxin 0.125 mg daily  - Add 12.5 mg spiro daily.   3. AS, Severely Calcified Suspected low flow. V max 2.6 MG 16 AVA 0.8 . LVEF 15-20%.  Will need TEE to further assess. As above will optimize  Discussed with Dr Abel Hoe. Plan to follow up in [facility] to addresses aortic valve.    4.  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  Assessed with definity today. LV thrombus could not be excluded.  - Continue heparin  drip until all procedures completed.   5. CKD Stage IIIa Unclear baseline. Creatinine 1.5  Watch closely.   6. Hypothyroid TSH 6. On Levothyroxine .   Check lactic acid--->2.7  Place PICC line. Check Co-OX and CVP.   Length of Stay: 1  Allicia Culley, NP  02/16/2024, 11:30 AM    Advanced Heart Failure Team Pager [phone] (M-F; 7a - 5p)  Please contact [facility] for night-coverage after hours (4p -7a ) and weekends on [email or web address]

## 2024-02-16 NOTE — Progress Notes (Signed)
   PICC placed. CO-OX 29%. CVP 12  Continue to diurese with IV lasix.   Add milrinone 0.25 mcg. Repeat CO-OX in 1 hour.   Estel Tonelli NP-C  6:28 PM

## 2024-02-17 LAB — CBC
HCT: 26 % — ABNORMAL LOW (ref 39.0–52.0)
Hemoglobin: 7.5 g/dL — ABNORMAL LOW (ref 13.0–17.0)
MCH: 24 pg — ABNORMAL LOW (ref 26.0–34.0)
MCHC: 28.8 g/dL — ABNORMAL LOW (ref 30.0–36.0)
MCV: 83.1 fL (ref 80.0–100.0)
Platelets: 342 10*3/uL (ref 150–400)
RBC: 3.13 MIL/uL — ABNORMAL LOW (ref 4.22–5.81)
RDW: 19.4 % — ABNORMAL HIGH (ref 11.5–15.5)
WBC: 16.2 10*3/uL — ABNORMAL HIGH (ref 4.0–10.5)
nRBC: 0 % (ref 0.0–0.2)

## 2024-02-17 LAB — HEPARIN LEVEL (UNFRACTIONATED)
Heparin Unfractionated: 0.38 [IU]/mL (ref 0.30–0.70)
Heparin Unfractionated: 0.54 [IU]/mL (ref 0.30–0.70)
Heparin Unfractionated: 0.52 [IU]/mL (ref 0.30–0.70)

## 2024-02-17 LAB — BASIC METABOLIC PANEL WITH GFR
Anion gap: 8 (ref 5–15)
BUN: 30 mg/dL — ABNORMAL HIGH (ref 8–23)
CO2: 24 mmol/L (ref 22–32)
Calcium: 7.6 mg/dL — ABNORMAL LOW (ref 8.9–10.3)
Chloride: 102 mmol/L (ref 98–111)
Creatinine, Ser: 1.78 mg/dL — ABNORMAL HIGH (ref 0.61–1.24)
GFR, Estimated: 40 mL/min — ABNORMAL LOW (ref >60)
Glucose, Bld: 193 mg/dL — ABNORMAL HIGH (ref 70–99)
Potassium: 3.1 mmol/L — ABNORMAL LOW (ref 3.5–5.1)
Sodium: 134 mmol/L — ABNORMAL LOW (ref 135–145)

## 2024-02-17 LAB — MAGNESIUM: Magnesium: 2.1 mg/dL (ref 1.7–2.4)

## 2024-02-17 LAB — GLUCOSE, CAPILLARY
Glucose-Capillary: 280 mg/dL — ABNORMAL HIGH (ref 70–99)
Glucose-Capillary: 132 mg/dL — ABNORMAL HIGH (ref 70–99)
Glucose-Capillary: 129 mg/dL — ABNORMAL HIGH (ref 70–99)

## 2024-02-17 LAB — COOXEMETRY PANEL
Carboxyhemoglobin: 1.7 % — ABNORMAL HIGH (ref 0.5–1.5)
Carboxyhemoglobin: 2 % — ABNORMAL HIGH (ref 0.5–1.5)
Methemoglobin: 1.8 % — ABNORMAL HIGH (ref 0.0–1.5)
Methemoglobin: <0.7 % (ref 0.0–1.5)
O2 Saturation: 44 %
O2 Saturation: 55.4 %
Total hemoglobin: 7.5 g/dL — ABNORMAL LOW (ref 12.0–16.0)
Total hemoglobin: 7.6 g/dL — ABNORMAL LOW (ref 12.0–16.0)

## 2024-02-17 LAB — CG4 I-STAT (LACTIC ACID): Lactic Acid, Venous: 1.1 mmol/L (ref 0.5–1.9)

## 2024-02-17 LAB — LIPOPROTEIN A (LPA): Lipoprotein (a): 21.2 nmol/L (ref <75.0)

## 2024-02-17 MED ORDER — INSULIN ASPART 100 UNIT/ML IJ SOLN
0.0000 [IU] | Freq: Three times a day (TID) | INTRAMUSCULAR | Status: DC
  Administered 2024-02-17: 2 [IU] via SUBCUTANEOUS
  Administered 2024-02-17: 8 [IU] via SUBCUTANEOUS
  Administered 2024-02-18 – 2024-02-19 (×5): 3 [IU] via SUBCUTANEOUS
  Administered 2024-02-19: 5 [IU] via SUBCUTANEOUS
  Administered 2024-02-20: 3 [IU] via SUBCUTANEOUS
  Administered 2024-02-20 – 2024-02-21 (×3): 5 [IU] via SUBCUTANEOUS
  Administered 2024-02-21: 3 [IU] via SUBCUTANEOUS
  Administered 2024-02-21: 5 [IU] via SUBCUTANEOUS
  Administered 2024-02-22 (×3): 3 [IU] via SUBCUTANEOUS
  Administered 2024-02-23: 2 [IU] via SUBCUTANEOUS
  Administered 2024-02-23: 8 [IU] via SUBCUTANEOUS
  Administered 2024-02-23: 3 [IU] via SUBCUTANEOUS
  Administered 2024-02-24: 11 [IU] via SUBCUTANEOUS
  Administered 2024-02-24: 5 [IU] via SUBCUTANEOUS
  Administered 2024-02-24 – 2024-02-25 (×2): 3 [IU] via SUBCUTANEOUS
  Administered 2024-02-25: 11 [IU] via SUBCUTANEOUS
  Administered 2024-02-25: 5 [IU] via SUBCUTANEOUS
  Administered 2024-02-26: 11 [IU] via SUBCUTANEOUS
  Administered 2024-02-26: 3 [IU] via SUBCUTANEOUS
  Administered 2024-02-26: 11 [IU] via SUBCUTANEOUS
  Administered 2024-02-27: 5 [IU] via SUBCUTANEOUS
  Administered 2024-02-27: 2 [IU] via SUBCUTANEOUS
  Administered 2024-02-27 – 2024-02-28 (×2): 3 [IU] via SUBCUTANEOUS

## 2024-02-17 MED ORDER — INSULIN ASPART 100 UNIT/ML IJ SOLN
0.0000 [IU] | Freq: Every day | INTRAMUSCULAR | Status: DC
  Administered 2024-02-24: 5 [IU] via SUBCUTANEOUS
  Administered 2024-02-25: 4 [IU] via SUBCUTANEOUS
  Administered 2024-02-26: 5 [IU] via SUBCUTANEOUS
  Administered 2024-02-27: 2 [IU] via SUBCUTANEOUS

## 2024-02-17 MED ORDER — POTASSIUM CHLORIDE CRYS ER 20 MEQ PO TBCR
40.0000 meq | EXTENDED_RELEASE_TABLET | Freq: Once | ORAL | Status: AC
  Administered 2024-02-17: 40 meq via ORAL
  Filled 2024-02-17: qty 2

## 2024-02-17 NOTE — Evaluation (Signed)
 Physical Therapy Evaluation Patient Details Name: Mark Meadows MRN: [id number] DOB: 12/09/1950 Today's Date: 02/17/2024  History of Present Illness  73 y.o. male presents to [facility] 02/15/24 as transfer from [facility] ED for evaluation of STEMI, stent placed. Complicated by cardiogenic shock. PMHx: HTN, gout, thrombocytopenia, anemia, dysrhythmia, CKD stage 3a  Clinical Impression  Pt in recliner upon arrival and agreeable to PT eval. PTA, pt was independent for mobility with no AD. In today's session, pt was ModI to stand with no AD and was able to ambulate 185ft with RW and supervision for safety. Anticipate pt will progress well with continued mobility. Pt has intermittent assist available from friends upon return home. Recommending HHPT to work towards independence with mobility and improving activity tolerance. Pt currently with functional limitations due to the deficits listed below (see PT Problem List). Pt would benefit from acute skilled PT to address functional impairments. Acute PT to follow.  BP 102/75, 111 BPM >92% SpO2 on RA with accurate pleth reading        If plan is discharge home, recommend the following: Assist for transportation;Help with stairs or ramp for entrance   Can travel by private vehicle    Yes    Equipment Recommendations  (TBD, interested in trialing rollator)     Functional Status Assessment Patient has had a recent decline in their functional status and demonstrates the ability to make significant improvements in function in a reasonable and predictable amount of time.     Precautions / Restrictions Precautions Precautions: Fall Precaution/Restrictions Comments: watch O2 Restrictions Weight Bearing Restrictions Per Provider Order: No      Mobility  Bed Mobility    General bed mobility comments: in recliner, returned to recliner    Transfers Overall transfer level: Modified independent Equipment used: None     Ambulation/Gait Ambulation/Gait  assistance: Supervision Gait Distance (Feet): 160 Feet Assistive device: Rolling walker (2 wheels) Gait Pattern/deviations: WFL(Within Functional Limits)       General Gait Details: steady gait with no overt LOB. x2 standing rest breaks due to increased WOB    Balance Overall balance assessment: Mild deficits observed, not formally tested        Pertinent Vitals/Pain Pain Assessment Pain Assessment: No/denies pain    Home Living Family/patient expects to be discharged to:: Private residence Living Arrangements: Non-relatives/Friends (lives with friend and her kids) Available Help at Discharge: Friend(s);Available PRN/intermittently Type of Home: House Home Access: Stairs to enter Entrance Stairs-Rails: Doctor, general practice of Steps: 4   Home Layout: One level Home Equipment: Agricultural consultant (2 wheels);Cane - single point;Grab bars - tub/shower      Prior Function Prior Level of Function : Independent/Modified Independent;Driving    Mobility Comments: Reports being mostly sedentary. Independent for mobility with no AD. Denies recent falls. ADLs Comments: Ind with ADLs and iADLs. Occasionally drives     Extremity/Trunk Assessment   Upper Extremity Assessment Upper Extremity Assessment: Overall WFL for tasks assessed    Lower Extremity Assessment Lower Extremity Assessment: Overall WFL for tasks assessed    Cervical / Trunk Assessment Cervical / Trunk Assessment: Normal  Communication   Communication Communication: No apparent difficulties    Cognition Arousal: Alert Behavior During Therapy: WFL for tasks assessed/performed   PT - Cognitive impairments: No apparent impairments     Following commands: Intact       Cueing Cueing Techniques: Verbal cues      PT Assessment Patient needs continued PT services  PT Problem List Decreased activity tolerance;Decreased  balance;Decreased mobility       PT Treatment Interventions DME  instruction;Gait training;Stair training;Functional mobility training;Therapeutic activities;Therapeutic exercise;Balance training;Neuromuscular re-education;Patient/family education    PT Goals (Current goals can be found in the Care Plan section)  Acute Rehab PT Goals Patient Stated Goal: to be more active PT Goal Formulation: With patient Time For Goal Achievement: 03/02/24 Potential to Achieve Goals: Good    Frequency Min 2X/week        AM-PAC PT "6 Clicks" Mobility  Outcome Measure Help needed turning from your back to your side while in a flat bed without using bedrails?: A Little Help needed moving from lying on your back to sitting on the side of a flat bed without using bedrails?: A Little Help needed moving to and from a bed to a chair (including a wheelchair)?: A Little Help needed standing up from a chair using your arms (e.g., wheelchair or bedside chair)?: None Help needed to walk in hospital room?: A Little Help needed climbing 3-5 steps with a railing? : A Little 6 Click Score: 19    End of Session Equipment Utilized During Treatment: Gait belt Activity Tolerance: Patient tolerated treatment well Patient left: in chair;with call bell/phone within reach Nurse Communication: Mobility status PT Visit Diagnosis: Other abnormalities of gait and mobility (R26.89);Unsteadiness on feet (R26.81)    Time: 0981-1914 PT Time Calculation (min) (ACUTE ONLY): 25 min   Charges:   PT Evaluation $PT Eval Low Complexity: 1 Low PT Treatments $Gait Training: 8-22 mins PT General Charges $$ ACUTE PT VISIT: 1 Visit       Orysia Blas, PT, DPT Secure Chat Preferred  Rehab Office [phone]   Alissa April Adela Ades 02/17/2024, 12:09 PM

## 2024-02-17 NOTE — Progress Notes (Signed)
 PHARMACY - ANTICOAGULATION CONSULT NOTE  Pharmacy Consult for heparin   Indication: chest pain/ACS  No Known Allergies  Patient Measurements: Height: 5\' 8"  (172.7 cm) Weight: 99.1 kg (218 lb 7.6 oz) IBW/kg (Calculated) : 68.4 HEPARIN  DW (KG): 90.4  Vital Signs: Temp: 98.4 F (36.9 C) (05/28 0754) Temp Source: Oral (05/28 0754) BP: 86/58 (05/28 0800) Pulse Rate: 108 (05/28 0800)  Labs: Recent Labs    02/15/24 1842 02/15/24 2139 02/16/24 0604 02/16/24 1325 02/17/24 0153 02/17/24 0429 02/17/24 0756  HGB 8.2*  --  8.2*  --   --   --   --   HCT 29.5*  --  28.5*  --   --   --   --   PLT 356  --  360  --   --   --   --   APTT 27  --   --   --   --   --   --   LABPROT 15.8*  --   --   --   --   --   --   INR 1.2  --   --   --   --   --   --   HEPARINUNFRC  --   --   --    < > 0.38 0.54 0.52  CREATININE 1.57*  --  1.49*  --   --  1.78*  --   TROPONINIHS >24,000* >24,000*  --   --   --   --   --    < > = values in this interval not displayed.    Estimated Creatinine Clearance: 41.6 mL/min (A) (by C-G formula based on SCr of 1.78 mg/dL (H)).   Medical History: Past Medical History:  Diagnosis Date   Acute blood loss anemia    Alcohol dependence (HCC)    Alcoholic hepatitis    Dysrhythmia    a. 08/2016 -?afib, not clear. b. 09/2017 - official dx atrial flutter.   E. coli UTI 09/22/2016   GERD (gastroesophageal reflux disease)    Gout    Hematochezia    Hyperglycemia    Hypertension    Hypokalemia    Hypothyroidism    Thrombocytopenia (HCC)    Assessment: 73 yo male here with STEMI and s/p cath with DES to the LAD and possible LV thrombus. Pharmacy consulted to dose heparin  for ACS. No anticoagulants noted PTA.  Heparin  level is therapeutic at 0.52, on 1550 units/hr. No s/sx of bleeding or infusion issues - drawn appropriately.   Goal of Therapy:  Heparin  level 0.3-0.7 units/ml Monitor platelets by anticoagulation protocol: Yes   Plan:  -Continue heparin  at  1550 units/hr  -Order heparin  level daily wth CBC -F/u plan to transition to DOAC and triple therapy associated plans  Thank you for allowing pharmacy to participate in this patient's care,  Nieves Bars, PharmD, BCCCP Clinical Pharmacist  Phone: [phone] 02/17/2024 9:23 AM  Please check AMION for all [facility] Pharmacy phone numbers After 10:00 PM, call Main Pharmacy [phone]

## 2024-02-17 NOTE — Progress Notes (Signed)
 Advanced Heart Failure Rounding Note  Cardiologist: None  Chief Complaint: Cardiogenic Shock  Subjective:   5/26 DES LAD.  5/27 CO-OX 29%.Started on milrinone 0.25 mcg. Diuresed with IV lasix.   CO-OX 55% QX Calculate Fick CO 5.8 CI 2.6  Denies SOB. Denies pain.    Objective:   Weight Range: 99.1 kg Body mass index is 33.22 kg/m.   Vital Signs:   Temp:  [97.8 F (36.6 C)-98.8 F (37.1 C)] 98.4 F (36.9 C) (05/28 0754) Pulse Rate:  [101-112] 108 (05/28 0800) Resp:  [12-37] 28 (05/28 0800) BP: (80-142)/(40-119) 86/58 (05/28 0800) SpO2:  [88 %-100 %] 97 % (05/28 0800) Weight:  [99.1 kg] 99.1 kg (05/28 0500) Last BM Date : 02/15/24  Weight change: Filed Weights   02/15/24 2045 02/15/24 2050 02/17/24 0500  Weight: 101.8 kg 101.8 kg 99.1 kg    Intake/Output:   Intake/Output Summary (Last 24 hours) at 02/17/2024 0928 Last data filed at 02/17/2024 0700 Gross per 24 hour  Intake 1614.9 ml  Output 2600 ml  Net -985.1 ml     CVP 3  Physical Exam   General:   No resp difficulty Neck: supple.  Cor: PMI nondisplaced. Tachy Regular  Regular rate & rhythm. No rubs, gallops. RUSB 2/6 murmurs. Lungs: clear Abdomen: soft, nontender, nondistended.  Extremities: no cyanosis, clubbing, rash, edema. RUE PICC  Neuro: alert & oriented x3   Telemetry  ST 100s  EKG   N/A  Labs    CBC Recent Labs    02/15/24 1842 02/16/24 0604  WBC 14.5* 15.9*  NEUTROABS 10.9*  --   HGB 8.2* 8.2*  HCT 29.5* 28.5*  MCV 84.5 82.1  PLT 356 360   Basic Metabolic Panel Recent Labs    16/10/96 0604 02/17/24 0429  NA 135 134*  K 3.8 3.1*  CL 102 102  CO2 23 24  GLUCOSE 173* 193*  BUN 23 30*  CREATININE 1.49* 1.78*  CALCIUM 8.4* 7.6*  MG  --  2.1   Liver Function Tests Recent Labs    02/15/24 1842  AST 115*  ALT 21  ALKPHOS 56  BILITOT 1.1  PROT 7.8  ALBUMIN 3.2*   No results for input(s): "LIPASE", "AMYLASE" in the last 72 hours. Cardiac Enzymes No results  for input(s): "CKTOTAL", "CKMB", "CKMBINDEX", "TROPONINI" in the last 72 hours.  BNP: BNP (last 3 results) No results for input(s): "BNP" in the last 8760 hours.  ProBNP (last 3 results) No results for input(s): "PROBNP" in the last 8760 hours.   D-Dimer No results for input(s): "DDIMER" in the last 72 hours. Hemoglobin A1C Recent Labs    02/15/24 1847  HGBA1C 6.4*   Fasting Lipid Panel Recent Labs    02/16/24 0604  CHOL 86  HDL 18*  LDLCALC 55  TRIG 67  CHOLHDL 4.8   Thyroid Function Tests Recent Labs    02/15/24 2139  TSH 6.656*    Other results:   Imaging    US  EKG SITE RITE Result Date: 02/16/2024 If Site Rite image not attached, placement could not be confirmed due to current cardiac rhythm.  ECHOCARDIOGRAM COMPLETE Result Date: 02/16/2024    ECHOCARDIOGRAM REPORT   Patient Name:   Mark Meadows Date of Exam: 02/16/2024 Medical Rec #:  [id number]    Height:       68.0 in Accession #:    [id number]   Weight:       224.4 lb Date of Birth:  November 14, 1950  BSA:          2.146 m Patient Age:    73 years     BP:           115/86 mmHg Patient Gender: M            HR:           107 bpm. Exam Location:  Inpatient Procedure: 2D Echo, Cardiac Doppler and Color Doppler (Both Spectral and Color            Flow Doppler were utilized during procedure). Indications:     I50.21 Acute systolic (congestive) heart failure  History:         Patient has prior history of Echocardiogram examinations, most                  recent 10/06/2017.  Sonographer:     Andrena Bang Referring Phys:  [id number] MICHAEL COOPER Diagnosing Phys: Jackquelyn Mass MD IMPRESSIONS  1. Akinesis of the mid septum into apex and inferior septum consistent with LAD infarction. LVEF 15-20%. Cannot exclude a layered LV thrombus on contrast imaging. Consider cardiac MRI for definitive evaluation. LVOT VTI 9 cm. CO estimated 1.8 L/min and CI 0.8 L/min/m2, concerning for low output state. Left ventricular ejection fraction, by  estimation, is 15-20%. The left ventricle has severely decreased function. The left ventricle demonstrates regional wall motion abnormalities (see scoring diagram/findings for description). There is moderate concentric left ventricular hypertrophy. Indeterminate diastolic filling due to E-A fusion.  2. Severely calcified aortic valve with restricted leaflet movement. Vmax 2.6 m/s, MG 16 mmHG, AVA 0.81 cm2, DI 0.24. This is concerning for low flow low gradient severe aortic stenosis. Recommend clinical correlation. The aortic valve is calcified. Aortic valve regurgitation is trivial. Moderate to severe aortic valve stenosis.  3. Right ventricular systolic function is normal. The right ventricular size is mildly enlarged. Tricuspid regurgitation signal is inadequate for assessing PA pressure.  4. The mitral valve is degenerative. Mild mitral valve regurgitation. No evidence of mitral stenosis.  5. The inferior vena cava is dilated in size with >50% respiratory variability, suggesting right atrial pressure of 8 mmHg. FINDINGS  Left Ventricle: Akinesis of the mid septum into apex and inferior septum consistent with LAD infarction. LVEF 15-20%. Cannot exclude a layered LV thrombus on contrast imaging. Consider cardiac MRI for definitive evaluation. LVOT VTI 9 cm. CO estimated 1.8 L/min and CI 0.8 L/min/m2, concerning for low output state. Left ventricular ejection fraction, by estimation, is 15-20%. The left ventricle has severely decreased function. The left ventricle demonstrates regional wall motion abnormalities. Definity   contrast agent was given IV to delineate the left ventricular endocardial borders. Strain was performed and the global longitudinal strain is indeterminate. The left ventricular internal cavity size was normal in size. There is moderate concentric left ventricular hypertrophy. Indeterminate diastolic filling due to E-A fusion.  LV Wall Scoring: The mid inferoseptal segment, apical septal segment,  apical anterior segment, apical inferior segment, and apex are akinetic. Right Ventricle: The right ventricular size is mildly enlarged. No increase in right ventricular wall thickness. Right ventricular systolic function is normal. Tricuspid regurgitation signal is inadequate for assessing PA pressure. Left Atrium: Left atrial size was normal in size. Right Atrium: Right atrial size was normal in size. Pericardium: There is no evidence of pericardial effusion. Mitral Valve: The mitral valve is degenerative in appearance. Mild mitral valve regurgitation. No evidence of mitral valve stenosis. MV peak gradient, 3.9 mmHg. The mean mitral valve gradient  is 2.0 mmHg. Tricuspid Valve: The tricuspid valve is grossly normal. Tricuspid valve regurgitation is mild . No evidence of tricuspid stenosis. Aortic Valve: Severely calcified aortic valve with restricted leaflet movement. Vmax 2.6 m/s, MG 16 mmHG, AVA 0.81 cm2, DI 0.24. This is concerning for low flow low gradient severe aortic stenosis. Recommend clinical correlation. The aortic valve is calcified. Aortic valve regurgitation is trivial. Moderate to severe aortic stenosis is present. Aortic valve mean gradient measures 16.0 mmHg. Aortic valve peak gradient measures 27.2 mmHg. Aortic valve area, by VTI measures 0.81 cm. Pulmonic Valve: The pulmonic valve was grossly normal. Pulmonic valve regurgitation is not visualized. No evidence of pulmonic stenosis. Aorta: The aortic root and ascending aorta are structurally normal, with no evidence of dilitation. Venous: The inferior vena cava is dilated in size with greater than 50% respiratory variability, suggesting right atrial pressure of 8 mmHg. IAS/Shunts: The atrial septum is grossly normal.  LEFT VENTRICLE PLAX 2D LVIDd:         3.60 cm      Diastology LVIDs:         3.30 cm      LV e' lateral: 8.70 cm/s LV PW:         1.50 cm LV IVS:        1.40 cm LVOT diam:     2.20 cm LV SV:         36 LV SV Index:   17 LVOT Area:      3.80 cm  LV Volumes (MOD) LV vol d, MOD A2C: 177.0 ml LV vol d, MOD A4C: 170.0 ml LV vol s, MOD A2C: 138.0 ml LV vol s, MOD A4C: 108.0 ml LV SV MOD A2C:     39.0 ml LV SV MOD A4C:     170.0 ml LV SV MOD BP:      55.4 ml RIGHT VENTRICLE RV S prime:     10.30 cm/s TAPSE (M-mode): 1.4 cm LEFT ATRIUM           Index LA Vol (A2C): 88.8 ml 41.37 ml/m LA Vol (A4C): 55.5 ml 25.86 ml/m  AORTIC VALVE AV Area (Vmax):    0.82 cm AV Area (Vmean):   0.65 cm AV Area (VTI):     0.81 cm AV Vmax:           261.00 cm/s AV Vmean:          187.000 cm/s AV VTI:            0.450 m AV Peak Grad:      27.2 mmHg AV Mean Grad:      16.0 mmHg LVOT Vmax:         56.00 cm/s LVOT Vmean:        32.200 cm/s LVOT VTI:          0.096 m LVOT/AV VTI ratio: 0.21  AORTA Ao Asc diam: 3.40 cm MITRAL VALVE MV Area VTI:  1.81 cm   SHUNTS MV Peak grad: 3.9 mmHg   Systemic VTI:  0.10 m MV Mean grad: 2.0 mmHg   Systemic Diam: 2.20 cm MV Vmax:      0.99 m/s MV Vmean:     67.5 cm/s Jackquelyn Mass MD Electronically signed by Jackquelyn Mass MD Signature Date/Time: 02/16/2024/11:50:50 AM    Final (Updated)      Medications:     Scheduled Medications:  aspirin  EC  81 mg Oral Daily   Chlorhexidine Gluconate Cloth  6 each Topical Daily  clopidogrel  75 mg Oral Q breakfast   digoxin  0.125 mg Oral Daily   furosemide  80 mg Intravenous BID   insulin  aspart  0-15 Units Subcutaneous TID WC   insulin  aspart  0-5 Units Subcutaneous QHS   levothyroxine   150 mcg Oral Q0600   pantoprazole   40 mg Oral Daily   potassium chloride   40 mEq Oral Once   rosuvastatin  40 mg Oral Daily   sodium chloride  flush  10-40 mL Intracatheter Q12H   sodium chloride  flush  3 mL Intravenous Q12H    Infusions:  heparin  1,550 Units/hr (02/17/24 0700)   milrinone 0.25 mcg/kg/min (02/17/24 0700)    PRN Medications: acetaminophen , diazepam, morphine  injection, nitroGLYCERIN, mouth rinse, sodium chloride  flush, sodium chloride  flush    Patient Profile   Mark Meadows is  a 73 year old with a history of HTN, HLD, and tobacco abuse. Admitted with late presenting MI.   Assessment/Plan  1. STEMI, Antrerior HS Trop >24K. Cath DES  Prox-Mid LAD. 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  Plan DAPT x 1 year. ASA + Plavix. Continue crestor 40 mg daily  - No chest pain.    2. Acute HFrEF, ICM +Valvular Disease+ Cardiogenic Shock  Echo EF 30% on admit. Echo today EF 15-20% RV normal with severe AS.  Initial CO-OX 29%. Started on Milrinone 0.25 mcg. CO-OX 55% QX Calculate CO 5.8 CI 2.6 - Continue milrinone 0.25 mcg today.  CVP 3-4 today. Stop IV lasix.  - No bb with low output. Check lactic acid  - Will need to slowly add GDMT - Continue digoxin 0.125 mg daily  - Stop spiro .  -Continue milrinone today. Consider weaning tomorrow.    3. AS, Severely Calcified Suspected low flow. V max 2.6 MG 16 AVA 0.8 . LVEF 15-20%.  Will need TEE to further assess. As above will optimize  Discussed with Dr Abel Hoe. Plan to follow up in [facility] to addresses aortic valve.     4.  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  Assessed with definity today. LV thrombus could not be excluded.  - Continue heparin  drip until all procedures completed.    5. CKD Stage IIIa Unclear baseline. Creatinine 1.5 >1.8  Watch closely.  Stop spiro . Hold IV lasix.    6. Hypothyroid TSH 6. On Levothyroxine .   OOB today  Length of Stay: 2  Mark Nipper, NP  02/17/2024, 9:28 AM  Advanced Heart Failure Team Pager [phone] (M-F; 7a - 5p)  Please contact [facility] for night-coverage after hours (5p -7a ) and weekends on [email or web address]

## 2024-02-17 NOTE — Plan of Care (Signed)
  Problem: Education: Goal: Knowledge of General Education information will improve Description: Including pain rating scale, medication(s)/side effects and non-pharmacologic comfort measures Outcome: Progressing   Problem: Health Behavior/Discharge Planning: Goal: Ability to manage health-related needs will improve Outcome: Progressing   Problem: Clinical Measurements: Goal: Ability to maintain clinical measurements within normal limits will improve Outcome: Progressing   Problem: Activity: Goal: Risk for activity intolerance will decrease Outcome: Progressing   Problem: Nutrition: Goal: Adequate nutrition will be maintained Outcome: Progressing   Problem: Coping: Goal: Level of anxiety will decrease Outcome: Progressing   Problem: Elimination: Goal: Will not experience complications related to urinary retention Outcome: Progressing   Problem: Pain Managment: Goal: General experience of comfort will improve and/or be controlled Outcome: Progressing   Problem: Safety: Goal: Ability to remain free from injury will improve Outcome: Progressing   Problem: Skin Integrity: Goal: Risk for impaired skin integrity will decrease Outcome: Progressing

## 2024-02-17 NOTE — Progress Notes (Signed)
 PHARMACY - ANTICOAGULATION CONSULT NOTE  Pharmacy Consult for heparin   Indication: chest pain/ACS  No Known Allergies  Patient Measurements: Height: 5\' 8"  (172.7 cm) Weight: 101.8 kg (224 lb 6.9 oz) IBW/kg (Calculated) : 68.4 HEPARIN  DW (KG): 90.4  Vital Signs: Temp: 98.5 F (36.9 C) (05/28 0000) Temp Source: Oral (05/28 0000) BP: 84/75 (05/28 0205) Pulse Rate: 106 (05/28 0205)  Labs: Recent Labs    02/15/24 1842 02/15/24 2139 02/16/24 0604 02/16/24 1325 02/17/24 0153  HGB 8.2*  --  8.2*  --   --   HCT 29.5*  --  28.5*  --   --   PLT 356  --  360  --   --   APTT 27  --   --   --   --   LABPROT 15.8*  --   --   --   --   INR 1.2  --   --   --   --   HEPARINUNFRC  --   --   --  <0.10* 0.38  CREATININE 1.57*  --  1.49*  --   --   TROPONINIHS >24,000* >24,000*  --   --   --     Estimated Creatinine Clearance: 50.3 mL/min (A) (by C-G formula based on SCr of 1.49 mg/dL (H)).   Medical History: Past Medical History:  Diagnosis Date   Acute blood loss anemia    Alcohol dependence (HCC)    Alcoholic hepatitis    Dysrhythmia    a. 08/2016 -?afib, not clear. b. 09/2017 - official dx atrial flutter.   E. coli UTI 09/22/2016   GERD (gastroesophageal reflux disease)    Gout    Hematochezia    Hyperglycemia    Hypertension    Hypokalemia    Hypothyroidism    Thrombocytopenia (HCC)    Assessment: 73 yo male here with STEMI and s/p cath with DES to the LAD and possible LV thrombus. Pharmacy consulted to dose heparin  for ACS. No anticoagulants noted PTA.  AM: heparin  level returned at 0.38 on 1550 units/hr. Per RN, no issues with heparin  running continuously or signs/symptoms of bleeding. Heparin  level drawn from PICC with heparin  running peripherally. Patient without chest pain, heparin  continued given ECHO unable to exclude LV thrombus.  Goal of Therapy:  Heparin  level 0.3-0.7 units/ml Monitor platelets by anticoagulation protocol: Yes   Plan:  -Continue heparin  at  1550 units/hr  -Confirmatory heparin  level in 8 hours and daily wth CBC -F/u plan to transition to DOAC vs cardiac MRI to r/o thrombus  Young Hensen, PharmD, BCPS Clinical Pharmacist 02/17/2024 2:53 AM

## 2024-02-17 NOTE — Inpatient Diabetes Management (Signed)
 Inpatient Diabetes Program Recommendations  AACE/ADA: New Consensus Statement on Inpatient Glycemic Control (2015)  Target Ranges:  Prepandial:   less than 140 mg/dL      Peak postprandial:   less than 180 mg/dL (1-2 hours)      Critically ill patients:  140 - 180 mg/dL   Lab Results  Component Value Date   GLUCAP 280 (H) 02/17/2024   HGBA1C 6.4 (H) 02/15/2024    Review of Glycemic Control  Latest Reference Range & Units 02/17/24 11:20  Glucose-Capillary 70 - 99 mg/dL 098 (H)   Diabetes history: Pre-DM, DM 2 Outpatient Diabetes medications: None (was prescribed Metformin in the past but never took it) Current orders for Inpatient glycemic control:  Novolog  0-15 units tid + hs  A1c 6.4% 5/26 6.1% in March this year  Spoke with pt in the past regarding A1c level and glucose control at home. Pt reports being prescribed metformin in the past but he never took it. Pt reports he does not have Diabetes and has been able to control levels in the past. Pt has never monitored his glucose levels. I spoke with pt about the progression of his A1c levels in just 2 months. Pt reports craving sweets lately and snacking at times. Spoke to pt regarding low sodium and portion sizes. Encouraged pt to follow up, watch his intake. He may potentially be required to start checking his glucose levels at home regularly and I told pt this. Informed pt the doctors maybe considering an SGLT2 medication that may also benefit his glucose trends.   Thanks,  Eloise Hake RN, MSN, BC-ADM Inpatient Diabetes Coordinator Team Pager [phone] (8a-5p)

## 2024-02-18 ENCOUNTER — Inpatient Hospital Stay (HOSPITAL_COMMUNITY)

## 2024-02-18 LAB — BASIC METABOLIC PANEL WITH GFR
Anion gap: 7 (ref 5–15)
BUN: 31 mg/dL — ABNORMAL HIGH (ref 8–23)
CO2: 24 mmol/L (ref 22–32)
Calcium: 7.2 mg/dL — ABNORMAL LOW (ref 8.9–10.3)
Chloride: 104 mmol/L (ref 98–111)
Creatinine, Ser: 1.62 mg/dL — ABNORMAL HIGH (ref 0.61–1.24)
GFR, Estimated: 44 mL/min — ABNORMAL LOW (ref >60)
Glucose, Bld: 208 mg/dL — ABNORMAL HIGH (ref 70–99)
Potassium: 3.8 mmol/L (ref 3.5–5.1)
Sodium: 135 mmol/L (ref 135–145)

## 2024-02-18 LAB — RETICULOCYTES
Immature Retic Fract: 35.2 % — ABNORMAL HIGH (ref 2.3–15.9)
Immature Retic Fract: 33 % — ABNORMAL HIGH (ref 2.3–15.9)
RBC.: 2.92 MIL/uL — ABNORMAL LOW (ref 4.22–5.81)
RBC.: 3.4 MIL/uL — ABNORMAL LOW (ref 4.22–5.81)
Retic Count, Absolute: 140.2 10*3/uL (ref 19.0–186.0)
Retic Count, Absolute: 146.9 10*3/uL (ref 19.0–186.0)
Retic Ct Pct: 4.8 % — ABNORMAL HIGH (ref 0.4–3.1)
Retic Ct Pct: 4.3 % — ABNORMAL HIGH (ref 0.4–3.1)

## 2024-02-18 LAB — GLUCOSE, CAPILLARY
Glucose-Capillary: 154 mg/dL — ABNORMAL HIGH (ref 70–99)
Glucose-Capillary: 189 mg/dL — ABNORMAL HIGH (ref 70–99)
Glucose-Capillary: 200 mg/dL — ABNORMAL HIGH (ref 70–99)
Glucose-Capillary: 178 mg/dL — ABNORMAL HIGH (ref 70–99)
Glucose-Capillary: 159 mg/dL — ABNORMAL HIGH (ref 70–99)

## 2024-02-18 LAB — FERRITIN: Ferritin: 14 ng/mL — ABNORMAL LOW (ref 24–336)

## 2024-02-18 LAB — CBC
HCT: 23.8 % — ABNORMAL LOW (ref 39.0–52.0)
HCT: 28.9 % — ABNORMAL LOW (ref 39.0–52.0)
Hemoglobin: 6.8 g/dL — CL (ref 13.0–17.0)
Hemoglobin: 8.4 g/dL — ABNORMAL LOW (ref 13.0–17.0)
MCH: 23.7 pg — ABNORMAL LOW (ref 26.0–34.0)
MCH: 24.4 pg — ABNORMAL LOW (ref 26.0–34.0)
MCHC: 28.6 g/dL — ABNORMAL LOW (ref 30.0–36.0)
MCHC: 29.1 g/dL — ABNORMAL LOW (ref 30.0–36.0)
MCV: 82.9 fL (ref 80.0–100.0)
MCV: 84 fL (ref 80.0–100.0)
Platelets: 286 10*3/uL (ref 150–400)
Platelets: 314 10*3/uL (ref 150–400)
RBC: 2.87 MIL/uL — ABNORMAL LOW (ref 4.22–5.81)
RBC: 3.44 MIL/uL — ABNORMAL LOW (ref 4.22–5.81)
RDW: 19.2 % — ABNORMAL HIGH (ref 11.5–15.5)
RDW: 18.6 % — ABNORMAL HIGH (ref 11.5–15.5)
WBC: 12.7 10*3/uL — ABNORMAL HIGH (ref 4.0–10.5)
WBC: 14 10*3/uL — ABNORMAL HIGH (ref 4.0–10.5)
nRBC: 0 % (ref 0.0–0.2)
nRBC: 0 % (ref 0.0–0.2)

## 2024-02-18 LAB — IRON AND TIBC
Iron: 27 ug/dL — ABNORMAL LOW (ref 45–182)
Saturation Ratios: 8 % — ABNORMAL LOW (ref 17.9–39.5)
TIBC: 361 ug/dL (ref 250–450)
UIBC: 334 ug/dL

## 2024-02-18 LAB — FOLATE
Folate: 15.9 ng/mL (ref >5.9)
Folate: 12.3 ng/mL (ref >5.9)

## 2024-02-18 LAB — COOXEMETRY PANEL
Carboxyhemoglobin: 1.6 % — ABNORMAL HIGH (ref 0.5–1.5)
Methemoglobin: 0.8 % (ref 0.0–1.5)
O2 Saturation: 49.2 %
Total hemoglobin: 7.3 g/dL — ABNORMAL LOW (ref 12.0–16.0)

## 2024-02-18 LAB — MAGNESIUM: Magnesium: 2.3 mg/dL (ref 1.7–2.4)

## 2024-02-18 LAB — PREPARE RBC (CROSSMATCH)

## 2024-02-18 LAB — HEPARIN LEVEL (UNFRACTIONATED): Heparin Unfractionated: 0.87 [IU]/mL — ABNORMAL HIGH (ref 0.30–0.70)

## 2024-02-18 LAB — VITAMIN B12: Vitamin B-12: 383 pg/mL (ref 180–914)

## 2024-02-18 MED ORDER — IOHEXOL 350 MG/ML SOLN
80.0000 mL | Freq: Once | INTRAVENOUS | Status: AC | PRN
  Administered 2024-02-18: 80 mL via INTRAVENOUS

## 2024-02-18 MED ORDER — LACTULOSE 10 GM/15ML PO SOLN
30.0000 g | Freq: Once | ORAL | Status: AC
  Administered 2024-02-18: 30 g via ORAL
  Filled 2024-02-18: qty 45

## 2024-02-18 MED ORDER — SENNOSIDES-DOCUSATE SODIUM 8.6-50 MG PO TABS
1.0000 | ORAL_TABLET | Freq: Every day | ORAL | Status: DC
  Administered 2024-02-18 – 2024-02-25 (×8): 1 via ORAL
  Filled 2024-02-18 (×9): qty 1

## 2024-02-18 MED ORDER — HEPARIN (PORCINE) 25000 UT/250ML-% IV SOLN
1650.0000 [IU]/h | INTRAVENOUS | Status: DC
  Administered 2024-02-18 – 2024-02-20 (×3): 1350 [IU]/h via INTRAVENOUS
  Administered 2024-02-21: 1500 [IU]/h via INTRAVENOUS
  Administered 2024-02-22: 1650 [IU]/h via INTRAVENOUS
  Filled 2024-02-18 (×5): qty 250

## 2024-02-18 MED ORDER — BISACODYL 10 MG RE SUPP
10.0000 mg | Freq: Once | RECTAL | Status: DC
  Filled 2024-02-18: qty 1

## 2024-02-18 MED ORDER — SODIUM CHLORIDE 0.9% IV SOLUTION: Freq: Once | INTRAVENOUS | Status: DC

## 2024-02-18 MED ORDER — SMOG ENEMA
960.0000 mL | Freq: Once | RECTAL | Status: DC
  Filled 2024-02-18: qty 960

## 2024-02-18 MED ORDER — FUROSEMIDE 10 MG/ML IJ SOLN
40.0000 mg | Freq: Once | INTRAMUSCULAR | Status: AC
  Administered 2024-02-18: 40 mg via INTRAVENOUS
  Filled 2024-02-18: qty 4

## 2024-02-18 MED ORDER — POTASSIUM CHLORIDE CRYS ER 20 MEQ PO TBCR
40.0000 meq | EXTENDED_RELEASE_TABLET | Freq: Once | ORAL | Status: AC
  Administered 2024-02-18: 40 meq via ORAL
  Filled 2024-02-18: qty 2

## 2024-02-18 MED ORDER — POLYETHYLENE GLYCOL 3350 17 G PO PACK
17.0000 g | PACK | Freq: Every day | ORAL | Status: DC
  Administered 2024-02-18 – 2024-02-21 (×3): 17 g via ORAL
  Filled 2024-02-18 (×5): qty 1

## 2024-02-18 NOTE — Progress Notes (Signed)
 PHARMACY - ANTICOAGULATION Pharmacy Consult for heparin   Indication: chest pain/ACS Brief A/P: Heparin  level supratherapeutic Decrease Heparin  rate  No Known Allergies  Patient Measurements: Height: 5\' 8"  (172.7 cm) Weight: 99.1 kg (218 lb 7.6 oz) IBW/kg (Calculated) : 68.4 HEPARIN  DW (KG): 90.4  Vital Signs: Temp: (P) 98.4 F (36.9 C) (05/29 0400) Temp Source: Oral (05/28 2350) BP: 99/73 (05/29 0400) Pulse Rate: 106 (05/29 0400)  Labs: Recent Labs    02/15/24 1842 02/15/24 2139 02/16/24 0604 02/16/24 1325 02/17/24 0429 02/17/24 0756 02/17/24 1027 02/18/24 0423  HGB 8.2*  --  8.2*  --   --   --  7.5* 6.8*  HCT 29.5*  --  28.5*  --   --   --  26.0* 23.8*  PLT 356  --  360  --   --   --  342 286  APTT 27  --   --   --   --   --   --   --   LABPROT 15.8*  --   --   --   --   --   --   --   INR 1.2  --   --   --   --   --   --   --   HEPARINUNFRC  --   --   --    < > 0.54 0.52  --  0.87*  CREATININE 1.57*  --  1.49*  --  1.78*  --   --   --   TROPONINIHS >24,000* >24,000*  --   --   --   --   --   --    < > = values in this interval not displayed.    Estimated Creatinine Clearance: 41.6 mL/min (A) (by C-G formula based on SCr of 1.78 mg/dL (H)).  Assessment: 73 yo male with STEMI  s/p cath/PCI, possible LV thrombus for heparin   Goal of Therapy:  Heparin  level 0.3-0.7 units/ml Monitor platelets by anticoagulation protocol: Yes   Plan:  Decrease Heparin  1350 units/hr Check heparin  level in 8 hours.   Claudine Cullens, PharmD, BCPS

## 2024-02-18 NOTE — Progress Notes (Signed)
 CARDIAC REHAB PHASE I   Stopped by to offer walk and provide education. Pt currently on bedpan. Post MI/stent written materials left for review. Will return at a later time to complete education.   Ronny Colas, RN BSN 02/18/2024 2:13 PM

## 2024-02-18 NOTE — Progress Notes (Signed)
   No complaints. Had large brown BM this afternoon  S/P 1 Unit PRBCs --> Hgb with appropriate rise. Hgb 8.4 . Check CBC in am.  On milrinone 0.25 mcg   Keiara Sneeringer NP-C  3:37 PM

## 2024-02-18 NOTE — Progress Notes (Addendum)
 PHARMACY - ANTICOAGULATION CONSULT NOTE  Pharmacy Consult for heparin   Indication: chest pain/ACS  No Known Allergies  Patient Measurements: Height: 5\' 8"  (172.7 cm) Weight: 99 kg (218 lb 4.1 oz) IBW/kg (Calculated) : 68.4 HEPARIN  DW (KG): 90.4  Vital Signs: Temp: 98.2 F (36.8 C) (05/29 0955) Temp Source: Oral (05/29 0955) BP: 114/79 (05/29 0955) Pulse Rate: 111 (05/29 0955)  Labs: Recent Labs    02/15/24 1842 02/15/24 2139 02/16/24 0604 02/16/24 1325 02/17/24 0429 02/17/24 0756 02/17/24 1027 02/18/24 0423  HGB 8.2*  --  8.2*  --   --   --  7.5* 6.8*  HCT 29.5*  --  28.5*  --   --   --  26.0* 23.8*  PLT 356  --  360  --   --   --  342 286  APTT 27  --   --   --   --   --   --   --   LABPROT 15.8*  --   --   --   --   --   --   --   INR 1.2  --   --   --   --   --   --   --   HEPARINUNFRC  --   --   --    < > 0.54 0.52  --  0.87*  CREATININE 1.57*  --  1.49*  --  1.78*  --   --  1.62*  TROPONINIHS >24,000* >24,000*  --   --   --   --   --   --    < > = values in this interval not displayed.    Estimated Creatinine Clearance: 45.6 mL/min (A) (by C-G formula based on SCr of 1.62 mg/dL (H)).   Medical History: Past Medical History:  Diagnosis Date   Acute blood loss anemia    Alcohol dependence (HCC)    Alcoholic hepatitis    Dysrhythmia    a. 08/2016 -?afib, not clear. b. 09/2017 - official dx atrial flutter.   E. coli UTI 09/22/2016   GERD (gastroesophageal reflux disease)    Gout    Hematochezia    Hyperglycemia    Hypertension    Hypokalemia    Hypothyroidism    Thrombocytopenia (HCC)    Assessment: 73 yo male here with STEMI and s/p cath with DES to the LAD and possible LV thrombus. Pharmacy consulted to dose heparin  for ACS. No anticoagulants noted PTA.  Heparin  level was supratherapeutic earlier and rate was appropriately reduced. Hgb came back low at 6.8 - plan for PRBCs. Was complaining of abdominal pain - no evidence of bleeding. Plan to hold  heparin  infusion until repeat CBC after PRBC.   Goal of Therapy:  Heparin  level 0.3-0.7 units/ml Monitor platelets by anticoagulation protocol: Yes   Plan:  -Hold heparin  infusion for now >> follow up CBC after PRBC prior to restarting  -F/u plan to transition to DOAC and triple therapy associated plans  Thank you for allowing pharmacy to participate in this patient's care,  Nieves Bars, PharmD, BCCCP Clinical Pharmacist  Phone: [phone] 02/18/2024 10:24 AM  Please check AMION for all [facility] Pharmacy phone numbers After 10:00 PM, call Main Pharmacy [phone]  ADDENDUM Hgb improved to 8.4 after 1 PRBC. Had bowel movement without any noticeable bleeding per RN. Discussed with team and will restart heparin  at previous reduced rate of 1350 units/hr and get heparin  level in 8 hours after restart. Will narrow goal range  to 0.3-0.5 given anemia.  Thank you for allowing pharmacy to participate in this patient's care,  Nieves Bars, PharmD, BCCCP Clinical Pharmacist

## 2024-02-18 NOTE — Progress Notes (Signed)
 Advanced Heart Failure Rounding Note  Cardiologist: None  Chief Complaint: Cardiogenic Shock  Subjective:   5/26 DES LAD.  5/27 CO-OX 29%.Started on milrinone  0.25 mcg. Diuresed with IV lasix .   On Milrinone  0.25 mcg CO-OX 49% Hgb 6.8 QX Calculate Fick CO 5.8 CI 2.6   Complaining of abdominal pain/Right thigh.    Objective:   Weight Range: 99 kg Body mass index is 33.19 kg/m.   Vital Signs:   Temp:  [97.6 F (36.4 C)-98.4 F (36.9 C)] 98.4 F (36.9 C) (05/29 0400) Pulse Rate:  [93-112] 111 (05/29 0600) Resp:  [13-33] 27 (05/29 0600) BP: (70-112)/(47-77) 112/75 (05/29 0600) SpO2:  [91 %-100 %] 99 % (05/29 0600) Weight:  [99 kg] 99 kg (05/29 0500) Last BM Date : 02/15/24  Weight change: Filed Weights   02/15/24 2050 02/17/24 0500 02/18/24 0500  Weight: 101.8 kg 99.1 kg 99 kg    Intake/Output:   Intake/Output Summary (Last 24 hours) at 02/18/2024 0714 Last data filed at 02/18/2024 0600 Gross per 24 hour  Intake 650.7 ml  Output 2350 ml  Net -1699.3 ml     CVP 11 Physical Exam   General:   No resp difficulty Neck: supple. JVP 10-11  Cor: PMI nondisplaced. Regular rate & rhythm. No rubs, gallops. RUSB 2/6 . Lungs: clear Abdomen: soft,  R sided tender, distended.  Extremities: no cyanosis, clubbing, rash, edema. R thigh pain.  Neuro: alert & oriented x3   Telemetry  ST 100s   EKG   N/A  Labs    CBC Recent Labs    02/15/24 1842 02/16/24 0604 02/17/24 1027 02/18/24 0423  WBC 14.5*   < > 16.2* 12.7*  NEUTROABS 10.9*  --   --   --   HGB 8.2*   < > 7.5* 6.8*  HCT 29.5*   < > 26.0* 23.8*  MCV 84.5   < > 83.1 82.9  PLT 356   < > 342 286   < > = values in this interval not displayed.   Basic Metabolic Panel Recent Labs    40/98/11 0429 02/18/24 0423  NA 134* 135  K 3.1* 3.8  CL 102 104  CO2 24 24  GLUCOSE 193* 208*  BUN 30* 31*  CREATININE 1.78* 1.62*  CALCIUM  7.6* 7.2*  MG 2.1 2.3   Liver Function Tests Recent Labs     02/15/24 1842  AST 115*  ALT 21  ALKPHOS 56  BILITOT 1.1  PROT 7.8  ALBUMIN 3.2*   No results for input(s): "LIPASE", "AMYLASE" in the last 72 hours. Cardiac Enzymes No results for input(s): "CKTOTAL", "CKMB", "CKMBINDEX", "TROPONINI" in the last 72 hours.  BNP: BNP (last 3 results) No results for input(s): "BNP" in the last 8760 hours.  ProBNP (last 3 results) No results for input(s): "PROBNP" in the last 8760 hours.   D-Dimer No results for input(s): "DDIMER" in the last 72 hours. Hemoglobin A1C Recent Labs    02/15/24 1847  HGBA1C 6.4*   Fasting Lipid Panel Recent Labs    02/16/24 0604  CHOL 86  HDL 18*  LDLCALC 55  TRIG 67  CHOLHDL 4.8   Thyroid Function Tests Recent Labs    02/15/24 2139  TSH 6.656*    Other results:   Imaging    No results found.    Medications:     Scheduled Medications:  sodium chloride    Intravenous Once   aspirin  EC  81 mg Oral Daily   Chlorhexidine   Gluconate Cloth  6 each Topical Daily   clopidogrel  75 mg Oral Q breakfast   digoxin  0.125 mg Oral Daily   insulin  aspart  0-15 Units Subcutaneous TID WC   insulin  aspart  0-5 Units Subcutaneous QHS   levothyroxine   150 mcg Oral Q0600   pantoprazole   40 mg Oral Daily   rosuvastatin  40 mg Oral Daily   sodium chloride  flush  10-40 mL Intracatheter Q12H   sodium chloride  flush  3 mL Intravenous Q12H    Infusions:  heparin  1,350 Units/hr (02/18/24 0600)   milrinone 0.25 mcg/kg/min (02/18/24 0606)    PRN Medications: acetaminophen , diazepam, morphine  injection, nitroGLYCERIN, mouth rinse, sodium chloride  flush, sodium chloride  flush    Patient Profile   [name] is a 73 year old with a history of HTN, HLD, and tobacco abuse. Admitted with late presenting MI.   Assessment/Plan  1. STEMI, Antrerior HS Trop >24K. Cath DES  Prox-Mid LAD. 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  Plan DAPT x 1 year. ASA + Plavix. Continue crestor 40 mg daily  - No chest pain.     2. Acute HFrEF, ICM +Valvular Disease+ Cardiogenic Shock  Echo EF 30% on admit. Echo today EF 15-20% RV normal with severe AS.  Initial CO-OX 29%. Started on Milrinone 0.25 mcg. CO-OX 49% Hgb < 7. QX Calculate CO 5.8 CI 2.6 - Continue milrinone 0.25 mcg today.  CVP up 11-12. Given 40 mg   - No bb with low output.  - Will need to slowly add GDMT - Continue digoxin 0.125 mg daily  - Stop spiro .    3. AS, Severely Calcified Suspected low flow. V max 2.6 MG 16 AVA 0.8 . LVEF 15-20%.  Will need TEE to further assess. As above will optimize  Discussed with Dr Abel Hoe. Plan to follow up in [facility] to addresses aortic valve.     4.  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  Assessed with definity today. LV thrombus could not be excluded.  - Hold heparin  for now with possible retroperitoneal bleed.    5. CKD Stage IIIa Unclear baseline. Creatinine trending down.  Watch closely.    6. Hypothyroid TSH 6. On Levothyroxine .   7. Anemia  Hgb down to 6.8 Getting 1UPRBC. No obvious source but with acute abdominal pain.   8. Abdominal Pain Developed acute abdominal pain--->R thigh.  Hgb trending down. Hold heparin . Stat CT abd/pelvis. ? RP bleed.    Length of Stay: 3  Mark Mosley, NP  02/18/2024, 7:14 AM  Advanced Heart Failure Team Pager [phone] (M-F; 7a - 5p)  Please contact [facility] for night-coverage after hours (5p -7a ) and weekends on [email or web address]

## 2024-02-19 LAB — CBC
HCT: 26.3 % — ABNORMAL LOW (ref 39.0–52.0)
HCT: 26.5 % — ABNORMAL LOW (ref 39.0–52.0)
HCT: 26.5 % — ABNORMAL LOW (ref 39.0–52.0)
Hemoglobin: 7.8 g/dL — ABNORMAL LOW (ref 13.0–17.0)
Hemoglobin: 7.8 g/dL — ABNORMAL LOW (ref 13.0–17.0)
Hemoglobin: 7.8 g/dL — ABNORMAL LOW (ref 13.0–17.0)
MCH: 24.6 pg — ABNORMAL LOW (ref 26.0–34.0)
MCH: 24.2 pg — ABNORMAL LOW (ref 26.0–34.0)
MCH: 24.3 pg — ABNORMAL LOW (ref 26.0–34.0)
MCHC: 29.7 g/dL — ABNORMAL LOW (ref 30.0–36.0)
MCHC: 29.4 g/dL — ABNORMAL LOW (ref 30.0–36.0)
MCHC: 29.4 g/dL — ABNORMAL LOW (ref 30.0–36.0)
MCV: 83 fL (ref 80.0–100.0)
MCV: 82.3 fL (ref 80.0–100.0)
MCV: 82.6 fL (ref 80.0–100.0)
Platelets: 285 10*3/uL (ref 150–400)
Platelets: 279 10*3/uL (ref 150–400)
Platelets: 262 10*3/uL (ref 150–400)
RBC: 3.17 MIL/uL — ABNORMAL LOW (ref 4.22–5.81)
RBC: 3.22 MIL/uL — ABNORMAL LOW (ref 4.22–5.81)
RBC: 3.21 MIL/uL — ABNORMAL LOW (ref 4.22–5.81)
RDW: 18.5 % — ABNORMAL HIGH (ref 11.5–15.5)
RDW: 18.5 % — ABNORMAL HIGH (ref 11.5–15.5)
RDW: 18.4 % — ABNORMAL HIGH (ref 11.5–15.5)
WBC: 14.6 10*3/uL — ABNORMAL HIGH (ref 4.0–10.5)
WBC: 14.2 10*3/uL — ABNORMAL HIGH (ref 4.0–10.5)
WBC: 13.8 10*3/uL — ABNORMAL HIGH (ref 4.0–10.5)
nRBC: 0 % (ref 0.0–0.2)
nRBC: 0 % (ref 0.0–0.2)
nRBC: 0 % (ref 0.0–0.2)

## 2024-02-19 LAB — TYPE AND SCREEN
ABO/RH(D): O POS
Antibody Screen: NEGATIVE
Unit division: 0

## 2024-02-19 LAB — COOXEMETRY PANEL
Carboxyhemoglobin: 1.8 % — ABNORMAL HIGH (ref 0.5–1.5)
Carboxyhemoglobin: 2.1 % — ABNORMAL HIGH (ref 0.5–1.5)
Methemoglobin: <0.7 % (ref 0.0–1.5)
Methemoglobin: <0.7 % (ref 0.0–1.5)
O2 Saturation: 52 %
O2 Saturation: 53.2 %
Total hemoglobin: 8.1 g/dL — ABNORMAL LOW (ref 12.0–16.0)
Total hemoglobin: 7.6 g/dL — ABNORMAL LOW (ref 12.0–16.0)

## 2024-02-19 LAB — BPAM RBC
Blood Product Expiration Date: 202506172359
ISSUE DATE / TIME: 202505290801
Unit Type and Rh: 5100

## 2024-02-19 LAB — BASIC METABOLIC PANEL WITH GFR
Anion gap: 10 (ref 5–15)
BUN: 28 mg/dL — ABNORMAL HIGH (ref 8–23)
CO2: 21 mmol/L — ABNORMAL LOW (ref 22–32)
Calcium: 7.1 mg/dL — ABNORMAL LOW (ref 8.9–10.3)
Chloride: 100 mmol/L (ref 98–111)
Creatinine, Ser: 1.8 mg/dL — ABNORMAL HIGH (ref 0.61–1.24)
GFR, Estimated: 39 mL/min — ABNORMAL LOW (ref >60)
Glucose, Bld: 204 mg/dL — ABNORMAL HIGH (ref 70–99)
Potassium: 3.8 mmol/L (ref 3.5–5.1)
Sodium: 131 mmol/L — ABNORMAL LOW (ref 135–145)

## 2024-02-19 LAB — GLUCOSE, CAPILLARY
Glucose-Capillary: 170 mg/dL — ABNORMAL HIGH (ref 70–99)
Glucose-Capillary: 185 mg/dL — ABNORMAL HIGH (ref 70–99)
Glucose-Capillary: 213 mg/dL — ABNORMAL HIGH (ref 70–99)
Glucose-Capillary: 175 mg/dL — ABNORMAL HIGH (ref 70–99)

## 2024-02-19 LAB — HEPARIN LEVEL (UNFRACTIONATED)
Heparin Unfractionated: 0.28 [IU]/mL — ABNORMAL LOW (ref 0.30–0.70)
Heparin Unfractionated: 0.32 [IU]/mL (ref 0.30–0.70)

## 2024-02-19 LAB — MAGNESIUM: Magnesium: 2 mg/dL (ref 1.7–2.4)

## 2024-02-19 LAB — URIC ACID: Uric Acid, Serum: 9.4 mg/dL — ABNORMAL HIGH (ref 3.7–8.6)

## 2024-02-19 MED ORDER — METHOCARBAMOL 500 MG PO TABS
500.0000 mg | ORAL_TABLET | Freq: Three times a day (TID) | ORAL | Status: DC | PRN
  Administered 2024-02-22 – 2024-02-25 (×2): 500 mg via ORAL
  Filled 2024-02-19 (×2): qty 1

## 2024-02-19 MED ORDER — COLCHICINE 0.6 MG PO TABS
0.6000 mg | ORAL_TABLET | Freq: Every day | ORAL | Status: DC
  Administered 2024-02-20 – 2024-02-21 (×2): 0.6 mg via ORAL
  Filled 2024-02-19 (×2): qty 1

## 2024-02-19 MED ORDER — COLCHICINE 0.6 MG PO TABS
1.2000 mg | ORAL_TABLET | Freq: Once | ORAL | Status: AC
  Administered 2024-02-19: 1.2 mg via ORAL
  Filled 2024-02-19: qty 2

## 2024-02-19 NOTE — Plan of Care (Signed)
  Problem: Clinical Measurements: Goal: Ability to maintain clinical measurements within normal limits will improve Outcome: Progressing Goal: Diagnostic test results will improve Outcome: Progressing   Problem: Nutrition: Goal: Adequate nutrition will be maintained Outcome: Progressing   

## 2024-02-19 NOTE — Progress Notes (Signed)
 PT Cancellation Note  Patient Details Name: Mark Meadows MRN: [id number] DOB: [birth year]   Cancelled Treatment:    Reason Eval/Treat Not Completed: Patient declined, no reason specified. Pt stated he is having an episode of gout in his L ankle with pain also in his R abdomen and leg. Pt declined moving to sit on the EOB or mobilizing due to pain. Educated on the importance of mobility with pt stating PT could check in later. Acute PT to re-attempt as schedule allows.   Mark Meadows, PT, DPT Secure Chat Preferred  Rehab Office [phone]   Mark Meadows 02/19/2024, 8:26 AM

## 2024-02-19 NOTE — Progress Notes (Signed)
 PHARMACY - ANTICOAGULATION CONSULT NOTE  Pharmacy Consult for heparin   Indication: chest pain/ACS/ LV thrombus  No Known Allergies  Patient Measurements: Height: 5\' 8"  (172.7 cm) Weight: 97.6 kg (215 lb 2.7 oz) IBW/kg (Calculated) : 68.4 HEPARIN  DW (KG): 90.4  Vital Signs: Temp: 97.6 F (36.4 C) (05/30 0800) Temp Source: Oral (05/30 0800) BP: 97/65 (05/30 0900) Pulse Rate: 110 (05/30 0905)  Labs: Recent Labs    02/17/24 0429 02/17/24 0756 02/18/24 0423 02/18/24 1307 02/19/24 0025 02/19/24 0431  HGB  --    < > 6.8* 8.4*  --  7.8*  HCT  --    < > 23.8* 28.9*  --  26.3*  PLT  --    < > 286 314  --  285  HEPARINUNFRC 0.54   < > 0.87*  --  0.28* 0.32  CREATININE 1.78*  --  1.62*  --   --  1.80*   < > = values in this interval not displayed.    Estimated Creatinine Clearance: 40.8 mL/min (A) (by C-G formula based on SCr of 1.8 mg/dL (H)).   Medical History: Past Medical History:  Diagnosis Date   Acute blood loss anemia    Alcohol dependence (HCC)    Alcoholic hepatitis    Dysrhythmia    a. 08/2016 -?afib, not clear. b. 09/2017 - official dx atrial flutter.   E. coli UTI 09/22/2016   GERD (gastroesophageal reflux disease)    Gout    Hematochezia    Hyperglycemia    Hypertension    Hypokalemia    Hypothyroidism    Thrombocytopenia (HCC)    Assessment: 73 yo male here with STEMI and s/p cath with DES to the LAD and possible LV thrombus. Pharmacy consulted to dose heparin  for ACS. No anticoagulants noted PTA.  Heparin  level was supratherapeutic earlier and rate was appropriately reduced. Hgb came back low at 6.8 - plan for PRBCs. Was complaining of abdominal pain - no evidence of bleeding. Plan to hold heparin  infusion until repeat CBC after PRBC.   Hgb improved to 8.4 after 1 PRBC. Had bowel movement without any noticeable bleeding per RN. Discussed with team and will restart heparin  at previous reduced rate of 1350 units/hr and get heparin  level in 8 hours after  restart. Will narrow goal range to 0.3-0.5 given anemia.  Heparin  level 0.32 on heparin  drip rate 1350 units/hr which is  at goal range. No new bleeding issues reported but hgb starteing to trend back down - will follow CBC   Goal of Therapy:  Heparin  level 0.3-0.5 Monitor platelets by anticoagulation protocol: Yes   Plan:  Continue heparin  infusion at 1350 units/hr F/u AM HL and CBC F/u plan to transition to DOAC and triple therapy associated plans     Hortensia Ma Pharm.D. CPP, BCPS Clinical Pharmacist [phone] 02/19/2024 9:58 AM    Please check AMION for all [facility] Pharmacy phone numbers After 10:00 PM, call Main Pharmacy [phone]

## 2024-02-19 NOTE — Progress Notes (Signed)
 Patient refused ambulation d/t increased abdominal pain that he describes as a "cramping, pressure". The abdominal muscle extends/cramps during these episodes where it becomes palpable. He is also having increased left ankle pain which he describes as "similar to a gout flare up". And he is having intermittent painful episodes in his right hip which he describes as a "fiery sensation".

## 2024-02-19 NOTE — Progress Notes (Signed)
 Advanced Heart Failure Rounding Note  Cardiologist: None  Chief Complaint: Cardiogenic Shock   Subjective:   5/26 Late presenting anterolateral STEMI s/p PCI/DES to LAD 5/27 CO-OX 29%. Started on 0.25 milrinone.  Remains on 0.25 milrinone with CO-OX 53%, Hgb 7.8 and Fick CI of 2.6.  CVP 3.  Hgb 7.8 after 1 u RBCs yesterday. No melena or hematochezia reported.   Notes abdominal muscle spasms and gout pain involving left ankle.   Objective:   Weight Range: 97.6 kg Body mass index is 32.72 kg/m.   Vital Signs:   Temp:  [97.6 F (36.4 C)-98.2 F (36.8 C)] 97.6 F (36.4 C) (05/30 0800) Pulse Rate:  [97-114] 109 (05/30 1000) Resp:  [15-34] 15 (05/30 1000) BP: (87-113)/(50-87) 110/87 (05/30 1000) SpO2:  [91 %-100 %] 98 % (05/30 1000) Weight:  [97.6 kg] 97.6 kg (05/30 0500) Last BM Date : 02/18/24  Weight change: Filed Weights   02/17/24 0500 02/18/24 0500 02/19/24 0500  Weight: 99.1 kg 99 kg 97.6 kg    Intake/Output:   Intake/Output Summary (Last 24 hours) at 02/19/2024 1101 Last data filed at 02/19/2024 1000 Gross per 24 hour  Intake 1018.85 ml  Output 3100 ml  Net -2081.15 ml      Physical Exam  General:  Fatigued appearing elderly male. Neck: no JVD Cor: Regular rate & rhythm. + AS murmur Abdomen: soft, nondistended.  Extremities: no edema, left ankle and great toe tenderness Neuro: alert & orientedx3. Affect pleasant    Telemetry  ST 100s   EKG   N/A  Labs    CBC Recent Labs    02/18/24 1307 02/19/24 0431  WBC 14.0* 14.6*  HGB 8.4* 7.8*  HCT 28.9* 26.3*  MCV 84.0 83.0  PLT 314 285   Basic Metabolic Panel Recent Labs    84/13/24 0423 02/19/24 0431  NA 135 131*  K 3.8 3.8  CL 104 100  CO2 24 21*  GLUCOSE 208* 204*  BUN 31* 28*  CREATININE 1.62* 1.80*  CALCIUM 7.2* 7.1*  MG 2.3 2.0   Liver Function Tests No results for input(s): "AST", "ALT", "ALKPHOS", "BILITOT", "PROT", "ALBUMIN" in the last 72 hours.  No results for  input(s): "LIPASE", "AMYLASE" in the last 72 hours. Cardiac Enzymes No results for input(s): "CKTOTAL", "CKMB", "CKMBINDEX", "TROPONINI" in the last 72 hours.  BNP: BNP (last 3 results) No results for input(s): "BNP" in the last 8760 hours.  ProBNP (last 3 results) No results for input(s): "PROBNP" in the last 8760 hours.   D-Dimer No results for input(s): "DDIMER" in the last 72 hours. Hemoglobin A1C No results for input(s): "HGBA1C" in the last 72 hours.  Fasting Lipid Panel No results for input(s): "CHOL", "HDL", "LDLCALC", "TRIG", "CHOLHDL", "LDLDIRECT" in the last 72 hours.  Thyroid Function Tests No results for input(s): "TSH", "T4TOTAL", "T3FREE", "THYROIDAB" in the last 72 hours.  Invalid input(s): "FREET3"   Other results:   Imaging    DG CHEST PORT 1 VIEW Result Date: 02/18/2024 CLINICAL DATA:  PICC line placement EXAM: PORTABLE CHEST 1 VIEW COMPARISON:  02/15/2024 chest x-ray.  Chest CT 02/18/2024. FINDINGS: Right PICC line tip at the cavoatrial junction. Cardiomegaly. Left retrocardiac opacity reflects large hiatal hernia. Patchy airspace opacities in the mid to lower lungs bilaterally, right greater than left. No effusions. IMPRESSION: Right PICC line tip at the cavoatrial junction. Cardiomegaly. Patchy bilateral mid to lower lung airspace opacities, improved since prior chest x-ray, similar to recent chest CT. Electronically Signed  By: Janeece Mechanic M.D.   On: 02/18/2024 22:35      Medications:     Scheduled Medications:  sodium chloride    Intravenous Once   aspirin  EC  81 mg Oral Daily   bisacodyl   10 mg Rectal Once   Chlorhexidine  Gluconate Cloth  6 each Topical Daily   clopidogrel   75 mg Oral Q breakfast   [START ON 02/20/2024] colchicine   0.6 mg Oral Daily   colchicine   1.2 mg Oral Once   digoxin   0.125 mg Oral Daily   insulin  aspart  0-15 Units Subcutaneous TID WC   insulin  aspart  0-5 Units Subcutaneous QHS   levothyroxine   150 mcg Oral Q0600    pantoprazole   40 mg Oral Daily   polyethylene glycol  17 g Oral Daily   rosuvastatin   40 mg Oral Daily   senna-docusate  1 tablet Oral Daily   sodium chloride  flush  10-40 mL Intracatheter Q12H   sodium chloride  flush  3 mL Intravenous Q12H    Infusions:  heparin  1,350 Units/hr (02/19/24 1000)   milrinone  0.25 mcg/kg/min (02/19/24 1000)    PRN Medications: acetaminophen , diazepam , methocarbamol , morphine  injection, nitroGLYCERIN , mouth rinse, sodium chloride  flush, sodium chloride  flush    Patient Profile   Mark Meadows is a 73 year old with a history of HTN, HLD, and tobacco abuse. Admitted with late presenting anterior STEMI c/b cardiogenic shock.  Assessment/Plan  1. Anterior STEMI with CAD -HS Trop >24K. Cath with 100% p to m LAD treated with PTCA/DES. 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  -Plan DAPT x 1 year. ASA + Plavix .  -Continue crestor  40 mg daily  - No chest pain.    2. Cardiogenic Shock  Acute HFrEF/ICM - Echo EF 30% on admit.  - Echo 5/27 EF 15-20% RV normal with severe AS.  - CO-OX 53% on 0.25 milrinone  with Fick CI of 2.6. - Wean milrinone  to 0.125 - No bb with low output.  - Will need to slowly add GDMT - Continue digoxin  0.125 mg daily  - Scr a little higher today and BP soft. Will hold off on adding ARB/ARNi and MRA. - Eventual RHC prior to discharge   3. Aortic valve stenosis - Echo 05/27: severely calcified aortic valve with restricted leaflets, AVA 0.81 cm2 an, MG 6 and DI 0.24, concering for low flow low gradient AS. -Discussed with Dr Abel Hoe. Plan to follow up in [facility] to addresses aortic valve.     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 CKD Stage IIIa -Unclear baseline. Scr 1.6-1.8 this admit. Scr up slightly today, 1.6>1.8. Monitor with inotrope wean.   6. Hypothyroid -TSH 6.  -On Levothyroxine .   7. Iron  deficiency anemia - 1 u RBCs on 05/29 - Hgb  7.8 this am. CBC q 8hrs today. - CTA 05/29 with no evidence of bleed - May need GI workup  8. Gout flare - Involving left ankle and great toe - Give 1.2 mg colchicine  today followed by 0.6 mg X 3 days.    Length of Stay: 4  Jeannelle Wiens N, PA-C  02/19/2024, 11:01 AM  Advanced Heart Failure Team Pager [phone] (M-F; 7a - 5p)  Please contact [facility] for night-coverage after hours (5p -7a ) and weekends on [email or web address]

## 2024-02-19 NOTE — Progress Notes (Signed)
 CARDIAC REHAB PHASE I     Pt resting in bed, having gout pain. Pain has limited his mobility today. PT/OT assisting with mobility needs. Post MI/stent education including restrictions, risk factors, exercise guidelines, antiplatelet therapy importance, MI booklet, NTG use, heart healthy diet and CRP2 reviewed. All questions and concerns addressed. Will refer to  [facility]  for CRP2. Will continue to follow.   [id number] Ronny Colas, RN BSN 02/19/2024 12:47 PM

## 2024-02-19 NOTE — Progress Notes (Addendum)
 PHARMACY - ANTICOAGULATION CONSULT NOTE  Pharmacy Consult for heparin   Indication: chest pain/ACS  No Known Allergies  Patient Measurements: Height: 5\' 8"  (172.7 cm) Weight: 99 kg (218 lb 4.1 oz) IBW/kg (Calculated) : 68.4 HEPARIN  DW (KG): 90.4  Vital Signs: Temp: 97.8 F (36.6 C) (05/29 2324) Temp Source: Axillary (05/29 2324) BP: 95/76 (05/30 0100) Pulse Rate: 111 (05/30 0100)  Labs: Recent Labs    02/16/24 0604 02/16/24 1325 02/17/24 0429 02/17/24 0756 02/17/24 1027 02/18/24 0423 02/18/24 1307 02/19/24 0025  HGB 8.2*  --   --   --  7.5* 6.8* 8.4*  --   HCT 28.5*  --   --   --  26.0* 23.8* 28.9*  --   PLT 360  --   --   --  342 286 314  --   HEPARINUNFRC  --    < > 0.54 0.52  --  0.87*  --  0.28*  CREATININE 1.49*  --  1.78*  --   --  1.62*  --   --    < > = values in this interval not displayed.    Estimated Creatinine Clearance: 45.6 mL/min (A) (by C-G formula based on SCr of 1.62 mg/dL (H)).   Medical History: Past Medical History:  Diagnosis Date   Acute blood loss anemia    Alcohol dependence (HCC)    Alcoholic hepatitis    Dysrhythmia    a. 08/2016 -?afib, not clear. b. 09/2017 - official dx atrial flutter.   E. coli UTI 09/22/2016   GERD (gastroesophageal reflux disease)    Gout    Hematochezia    Hyperglycemia    Hypertension    Hypokalemia    Hypothyroidism    Thrombocytopenia (HCC)    Assessment: 73 yo male here with STEMI and s/p cath with DES to the LAD and possible LV thrombus. Pharmacy consulted to dose heparin  for ACS. No anticoagulants noted PTA.  Heparin  level was supratherapeutic earlier and rate was appropriately reduced. Hgb came back low at 6.8 - plan for PRBCs. Was complaining of abdominal pain - no evidence of bleeding. Plan to hold heparin  infusion until repeat CBC after PRBC.   Hgb improved to 8.4 after 1 PRBC. Had bowel movement without any noticeable bleeding per RN. Discussed with team and will restart heparin  at previous  reduced rate of 1350 units/hr and get heparin  level in 8 hours after restart. Will narrow goal range to 0.3-0.5 given anemia.  Heparin  level 0.28 on 1350 units/hr which is almost at goal range. No new bleeding issues reported per discussion with RN.  Goal of Therapy:  Heparin  level 0.3-0.5 Monitor platelets by anticoagulation protocol: Yes   Plan:  Continue heparin  infusion at 1350 units/hr F/u AM HL and CBC F/u plan to transition to DOAC and triple therapy associated plans    Armanda Bern, PharmD, BCPS 02/19/2024 1:41 AM  Please check AMION for all [facility] Pharmacy phone numbers After 10:00 PM, call Main Pharmacy [phone]   ADDENDUM: Heparin  level is therapeutic at 0.32 on 1350 units/hr. No new issues. Will continue current rate.  Armanda Bern, PharmD, BCPS 02/19/2024 5:48 AM

## 2024-02-20 LAB — BASIC METABOLIC PANEL WITH GFR
Anion gap: 8 (ref 5–15)
BUN: 27 mg/dL — ABNORMAL HIGH (ref 8–23)
CO2: 20 mmol/L — ABNORMAL LOW (ref 22–32)
Calcium: 7 mg/dL — ABNORMAL LOW (ref 8.9–10.3)
Chloride: 103 mmol/L (ref 98–111)
Creatinine, Ser: 1.75 mg/dL — ABNORMAL HIGH (ref 0.61–1.24)
GFR, Estimated: 40 mL/min — ABNORMAL LOW (ref >60)
Glucose, Bld: 189 mg/dL — ABNORMAL HIGH (ref 70–99)
Potassium: 3.9 mmol/L (ref 3.5–5.1)
Sodium: 131 mmol/L — ABNORMAL LOW (ref 135–145)

## 2024-02-20 LAB — GLUCOSE, CAPILLARY
Glucose-Capillary: 208 mg/dL — ABNORMAL HIGH (ref 70–99)
Glucose-Capillary: 212 mg/dL — ABNORMAL HIGH (ref 70–99)
Glucose-Capillary: 168 mg/dL — ABNORMAL HIGH (ref 70–99)
Glucose-Capillary: 221 mg/dL — ABNORMAL HIGH (ref 70–99)
Glucose-Capillary: 171 mg/dL — ABNORMAL HIGH (ref 70–99)

## 2024-02-20 LAB — COOXEMETRY PANEL
Carboxyhemoglobin: 1.8 % — ABNORMAL HIGH (ref 0.5–1.5)
Carboxyhemoglobin: 1.1 % (ref 0.5–1.5)
Methemoglobin: <0.7 % (ref 0.0–1.5)
Methemoglobin: <0.7 % (ref 0.0–1.5)
O2 Saturation: 58.2 %
O2 Saturation: 48.4 %
Total hemoglobin: 7.5 g/dL — ABNORMAL LOW (ref 12.0–16.0)
Total hemoglobin: 7.5 g/dL — ABNORMAL LOW (ref 12.0–16.0)

## 2024-02-20 LAB — CBC
HCT: 26.8 % — ABNORMAL LOW (ref 39.0–52.0)
HCT: 29.1 % — ABNORMAL LOW (ref 39.0–52.0)
HCT: 27.1 % — ABNORMAL LOW (ref 39.0–52.0)
Hemoglobin: 7.6 g/dL — ABNORMAL LOW (ref 13.0–17.0)
Hemoglobin: 8.4 g/dL — ABNORMAL LOW (ref 13.0–17.0)
Hemoglobin: 7.9 g/dL — ABNORMAL LOW (ref 13.0–17.0)
MCH: 23.8 pg — ABNORMAL LOW (ref 26.0–34.0)
MCH: 24 pg — ABNORMAL LOW (ref 26.0–34.0)
MCH: 24.2 pg — ABNORMAL LOW (ref 26.0–34.0)
MCHC: 28.4 g/dL — ABNORMAL LOW (ref 30.0–36.0)
MCHC: 28.9 g/dL — ABNORMAL LOW (ref 30.0–36.0)
MCHC: 29.2 g/dL — ABNORMAL LOW (ref 30.0–36.0)
MCV: 83.8 fL (ref 80.0–100.0)
MCV: 83.1 fL (ref 80.0–100.0)
MCV: 83.1 fL (ref 80.0–100.0)
Platelets: 273 10*3/uL (ref 150–400)
Platelets: 308 10*3/uL (ref 150–400)
Platelets: 284 10*3/uL (ref 150–400)
RBC: 3.2 MIL/uL — ABNORMAL LOW (ref 4.22–5.81)
RBC: 3.5 MIL/uL — ABNORMAL LOW (ref 4.22–5.81)
RBC: 3.26 MIL/uL — ABNORMAL LOW (ref 4.22–5.81)
RDW: 18.5 % — ABNORMAL HIGH (ref 11.5–15.5)
RDW: 18.6 % — ABNORMAL HIGH (ref 11.5–15.5)
RDW: 18.5 % — ABNORMAL HIGH (ref 11.5–15.5)
WBC: 11.7 10*3/uL — ABNORMAL HIGH (ref 4.0–10.5)
WBC: 15.4 10*3/uL — ABNORMAL HIGH (ref 4.0–10.5)
WBC: 13.3 10*3/uL — ABNORMAL HIGH (ref 4.0–10.5)
nRBC: 0 % (ref 0.0–0.2)
nRBC: 0 % (ref 0.0–0.2)
nRBC: 0 % (ref 0.0–0.2)

## 2024-02-20 LAB — MAGNESIUM: Magnesium: 2.1 mg/dL (ref 1.7–2.4)

## 2024-02-20 LAB — HEPARIN LEVEL (UNFRACTIONATED): Heparin Unfractionated: 0.28 [IU]/mL — ABNORMAL LOW (ref 0.30–0.70)

## 2024-02-20 MED ORDER — SODIUM CHLORIDE 0.9% FLUSH
10.0000 mL | Freq: Two times a day (BID) | INTRAVENOUS | Status: DC
  Administered 2024-02-20 – 2024-02-27 (×14): 10 mL

## 2024-02-20 MED ORDER — SODIUM CHLORIDE 0.9% FLUSH: 10.0000 mL | INTRAVENOUS | Status: DC | PRN

## 2024-02-20 NOTE — Plan of Care (Signed)
  Problem: Pain Managment: Goal: General experience of comfort will improve and/or be controlled Outcome: Progressing   Problem: Safety: Goal: Ability to remain free from injury will improve Outcome: Progressing   Problem: Skin Integrity: Goal: Risk for impaired skin integrity will decrease Outcome: Progressing

## 2024-02-20 NOTE — Progress Notes (Signed)

## 2024-02-20 NOTE — Progress Notes (Signed)
 PHARMACY - ANTICOAGULATION CONSULT NOTE  Pharmacy Consult for heparin   Indication:  LV thrombus  No Known Allergies  Patient Measurements: Height: 5\' 8"  (172.7 cm) Weight: 99.1 kg (218 lb 7.6 oz) IBW/kg (Calculated) : 68.4 HEPARIN  DW (KG): 90.4  Vital Signs: Temp: 98.3 F (36.8 C) (05/31 1200) Temp Source: Oral (05/31 1200) BP: 109/71 (05/31 1100) Pulse Rate: 110 (05/31 1100)  Labs: Recent Labs    02/18/24 0423 02/18/24 1307 02/19/24 0025 02/19/24 0431 02/19/24 1300 02/19/24 1724 02/20/24 0500 02/20/24 0844  HGB 6.8*   < >  --  7.8* 7.8* 7.8*  --  7.6*  HCT 23.8*   < >  --  26.3* 26.5* 26.5*  --  26.8*  PLT 286   < >  --  285 279 262  --  273  HEPARINUNFRC 0.87*  --  0.28* 0.32  --   --  0.28*  --   CREATININE 1.62*  --   --  1.80*  --   --  1.75*  --    < > = values in this interval not displayed.    Estimated Creatinine Clearance: 42.3 mL/min (A) (by C-G formula based on SCr of 1.75 mg/dL (H)).   Medical History: Past Medical History:  Diagnosis Date   Acute blood loss anemia    Alcohol dependence (HCC)    Alcoholic hepatitis    Dysrhythmia    a. 08/2016 -?afib, not clear. b. 09/2017 - official dx atrial flutter.   E. coli UTI 09/22/2016   GERD (gastroesophageal reflux disease)    Gout    Hematochezia    Hyperglycemia    Hypertension    Hypokalemia    Hypothyroidism    Thrombocytopenia (HCC)    Assessment: 73 yo male here with STEMI and s/p cath with DES to the LAD and possible LV thrombus. Pharmacy consulted to dose heparin  for ACS. No anticoagulants noted PTA.  Heparin  level was supratherapeutic earlier and rate was appropriately reduced. Hgb came back low at 6.8 - plan for PRBCs. Was complaining of abdominal pain - no evidence of bleeding. Plan to hold heparin  infusion until repeat CBC after PRBC.   Hgb improved to 8.4 after 1 PRBC. Had bowel movement without any noticeable bleeding per RN. Discussed with team and will restart heparin   Will narrow  goal range to 0.3-0.5 given anemia.  Heparin  level 0.28 on heparin  drip rate 1350 units/hr which is slightly less than goal range. No new bleeding issues reported but hgb starteing to trend back down - will follow CBC   Goal of Therapy:  Heparin  level 0.3-0.5 Monitor platelets by anticoagulation protocol: Yes   Plan:  Increase heparin  infusion at 1400 units/hr F/u AM HL and CBC F/u plan to transition to DOAC and triple therapy associated plans     Hortensia Ma Pharm.D. CPP, BCPS Clinical Pharmacist [phone] 02/20/2024 1:49 PM    Please check AMION for all [facility] Pharmacy phone numbers After 10:00 PM, call Main Pharmacy [phone]

## 2024-02-20 NOTE — Progress Notes (Signed)
 Physical Therapy Treatment Patient Details Name: Mark Meadows MRN: [id number] DOB: 1951-01-29 Today's Date: 02/20/2024   History of Present Illness 73 y.o. male presents to [facility] 02/15/24 as transfer from [facility] ED for evaluation of STEMI, stent placed. Complicated by cardiogenic shock. PMHx: HTN, gout, thrombocytopenia, anemia, dysrhythmia, CKD stage 3a   PT Comments  Follow-up session to work on transfers and evaluate decline in mobility from initial eval. Pt required increased assistance this date to stand and was unable to progress to gait training. Primarily limited by pain in L ankle and abdomen at rest and with movement. Pt initially stood from recliner with MaxA, however, was unable to maintain upright position and slowly lowered to the recliner. Upon second attempt, pt required ModA for boost-up and steadying with RW. Pt was able to perform step-pivot with increased effort and antalgic step pattern. Pt mentioned that he does not have strong physical assist available at home. Recommending <3hrs post acute rehab to work towards independence with mobility with pt in agreement. Pt has potential to d/c home with continued pain management and mobility. Acute PT to follow.     If plan is discharge home, recommend the following: A lot of help with walking and/or transfers;A lot of help with bathing/dressing/bathroom;Assistance with cooking/housework;Assist for transportation;Help with stairs or ramp for entrance   Can travel by private vehicle     No  Equipment Recommendations  Other (comment) (TBA with progress)       Precautions / Restrictions Precautions Precautions: Fall Restrictions Weight Bearing Restrictions Per Provider Order: No     Mobility  Bed Mobility Overal bed mobility: Needs Assistance Bed Mobility: Sit to Supine     Sit to supine: Contact guard assist, HOB elevated   General bed mobility comments: CGA with head elevated and increased time/effort    Transfers Overall  transfer level: Needs assistance Equipment used: Rolling walker (2 wheels) Transfers: Sit to/from Stand, Bed to chair/wheelchair/BSC Sit to Stand: Mod assist, Max assist   Step pivot transfers: Mod assist     General transfer comment: Initial stand from recliner with MaxA for boost-up and steadying assist. Pt unable to maintain stand and slowly lowered to recliner. Upon second attempt, pt was able to achieve upright position with ModA. Step-pivot transfer to bed with antalgic steps    Ambulation/Gait    General Gait Details: Unable this date     Balance Overall balance assessment: Needs assistance, Mild deficits observed, not formally tested Sitting-balance support: No upper extremity supported, Feet supported Sitting balance-Leahy Scale: Good     Standing balance support: Bilateral upper extremity supported, During functional activity, Reliant on assistive device for balance Standing balance-Leahy Scale: Poor Standing balance comment: reliant on RW and UE support       Communication Communication Communication: No apparent difficulties  Cognition Arousal: Alert Behavior During Therapy: WFL for tasks assessed/performed   PT - Cognitive impairments: No apparent impairments    Following commands: Intact      Cueing Cueing Techniques: Verbal cues  Exercises General Exercises - Lower Extremity Ankle Circles/Pumps: AROM, Both, Supine, 10 reps Quad Sets: Strengthening, Both, 5 reps, Supine Long Arc Quad: AROM, Both, 5 reps, Seated Heel Slides: AROM, Both, 5 reps, Supine Hip ABduction/ADduction: AROM, Both, Supine (2 reps) Straight Leg Raises: AROM, Both, Supine (2 reps)        Pertinent Vitals/Pain Pain Assessment Pain Assessment: Faces Faces Pain Scale: Hurts whole lot Pain Location: L ankle, abdomen, R leg Pain Descriptors / Indicators: Aching, Discomfort Pain  Intervention(s): Limited activity within patient's tolerance, Monitored during session, Repositioned      PT Goals (current goals can now be found in the care plan section) Acute Rehab PT Goals Patient Stated Goal: to have less pain when moving PT Goal Formulation: With patient Time For Goal Achievement: 03/02/24 Potential to Achieve Goals: Good Progress towards PT goals: Progressing toward goals    Frequency    Min 2X/week       AM-PAC PT "6 Clicks" Mobility   Outcome Measure  Help needed turning from your back to your side while in a flat bed without using bedrails?: A Little Help needed moving from lying on your back to sitting on the side of a flat bed without using bedrails?: A Little Help needed moving to and from a bed to a chair (including a wheelchair)?: A Lot Help needed standing up from a chair using your arms (e.g., wheelchair or bedside chair)?: A Lot Help needed to walk in hospital room?: Total Help needed climbing 3-5 steps with a railing? : Total 6 Click Score: 12    End of Session Equipment Utilized During Treatment: Gait belt Activity Tolerance: Patient limited by pain Patient left: in bed;with call bell/phone within reach;with nursing/sitter in room Nurse Communication: Mobility status;Other (comment) (RN present during session) PT Visit Diagnosis: Other abnormalities of gait and mobility (R26.89);Unsteadiness on feet (R26.81)     Time: 1610-9604 PT Time Calculation (min) (ACUTE ONLY): 13 min  Charges:    $Therapeutic Exercise: 8-22 mins $Therapeutic Activity: 8-22 mins PT General Charges $$ ACUTE PT VISIT: 1 Visit                     Orysia Blas, PT, DPT Secure Chat Preferred  Rehab Office [phone]   Alissa April Adela Ades 02/20/2024, 12:46 PM

## 2024-02-20 NOTE — Progress Notes (Signed)
 Physical Therapy Treatment Patient Details Name: Mark Meadows MRN: [id number] DOB: 1951/01/28 Today's Date: 02/20/2024   History of Present Illness 73 y.o. male presents to [facility] 02/15/24 as transfer from [facility] ED for evaluation of STEMI, stent placed. Complicated by cardiogenic shock. PMHx: HTN, gout, thrombocytopenia, anemia, dysrhythmia, CKD stage 3a   PT Comments  Pt in recliner upon arrival and agreeable to PT session. Pt has had a decline in functional mobility over the past few days due to pain in L ankle, abdomen, and R LE. Pt reports having gout in L ankle in the past, however, this pain is more debilitating. Pt was able to tolerate a few exercises in sitting and with legs elevated before having an increase in pain. Multiple rest breaks needed in between sets. Deferred transfer at this time as pt appeared to have IV out of place, RN notified. Acute PT to follow-up after IV is secured to work on transfers. Acute PT to follow.     If plan is discharge home, recommend the following: A lot of help with walking and/or transfers;A lot of help with bathing/dressing/bathroom;Assistance with cooking/housework;Assist for transportation;Help with stairs or ramp for entrance   Can travel by private vehicle     No  Equipment Recommendations  Other (comment) (TBA with progress)       Precautions / Restrictions Precautions Precautions: Fall Restrictions Weight Bearing Restrictions Per Provider Order: No     Mobility  Bed Mobility  General bed mobility comments: In recliner, deferred transfer until IV secure      Balance Overall balance assessment: Needs assistance, Mild deficits observed, not formally tested Sitting-balance support: No upper extremity supported, Feet supported Sitting balance-Leahy Scale: Good       Communication Communication Communication: No apparent difficulties  Cognition Arousal: Alert Behavior During Therapy: WFL for tasks assessed/performed   PT - Cognitive  impairments: No apparent impairments  Following commands: Intact      Cueing Cueing Techniques: Verbal cues  Exercises General Exercises - Lower Extremity Ankle Circles/Pumps: AROM, Both, Supine, 10 reps Quad Sets: Strengthening, Both, 5 reps, Supine Long Arc Quad: AROM, Both, 5 reps, Seated Heel Slides: AROM, Both, 5 reps, Supine Hip ABduction/ADduction: AROM, Both, Supine (2 reps) Straight Leg Raises: AROM, Both, Supine (2 reps)        Pertinent Vitals/Pain Pain Assessment Pain Assessment: Faces Faces Pain Scale: Hurts whole lot Pain Location: L ankle, abdomen, R leg Pain Descriptors / Indicators: Aching, Discomfort Pain Intervention(s): Monitored during session, Limited activity within patient's tolerance, Repositioned     PT Goals (current goals can now be found in the care plan section) Acute Rehab PT Goals Patient Stated Goal: to have less pain when moving PT Goal Formulation: With patient Time For Goal Achievement: 03/02/24 Potential to Achieve Goals: Good Progress towards PT goals: Progressing toward goals    Frequency    Min 2X/week       AM-PAC PT "6 Clicks" Mobility   Outcome Measure  Help needed turning from your back to your side while in a flat bed without using bedrails?: A Little Help needed moving from lying on your back to sitting on the side of a flat bed without using bedrails?: A Little Help needed moving to and from a bed to a chair (including a wheelchair)?: A Lot Help needed standing up from a chair using your arms (e.g., wheelchair or bedside chair)?: A Lot Help needed to walk in hospital room?: Total Help needed climbing 3-5 steps with a railing? :  Total 6 Click Score: 12    End of Session   Activity Tolerance: Patient tolerated treatment well Patient left: in chair;with call bell/phone within reach Nurse Communication: Mobility status;Other (comment) (Blood around IV site) PT Visit Diagnosis: Other abnormalities of gait and mobility  (R26.89);Unsteadiness on feet (R26.81)     Time: 1030-1050 PT Time Calculation (min) (ACUTE ONLY): 20 min  Charges:    $Therapeutic Exercise: 8-22 mins PT General Charges $$ ACUTE PT VISIT: 1 Visit                     Orysia Blas, PT, DPT Secure Chat Preferred  Rehab Office [phone]   Alissa April Adela Ades 02/20/2024, 12:37 PM

## 2024-02-20 NOTE — Plan of Care (Signed)
  Problem: Education: Goal: Knowledge of General Education information will improve Description: Including pain rating scale, medication(s)/side effects and non-pharmacologic comfort measures Outcome: Progressing   Problem: Health Behavior/Discharge Planning: Goal: Ability to manage health-related needs will improve Outcome: Progressing   Problem: Clinical Measurements: Goal: Ability to maintain clinical measurements within normal limits will improve Outcome: Progressing Goal: Will remain free from infection Outcome: Progressing Goal: Diagnostic test results will improve Outcome: Progressing Goal: Respiratory complications will improve Outcome: Progressing Goal: Cardiovascular complication will be avoided Outcome: Progressing   Problem: Activity: Goal: Risk for activity intolerance will decrease Outcome: Progressing   Problem: Nutrition: Goal: Adequate nutrition will be maintained Outcome: Progressing   Problem: Coping: Goal: Level of anxiety will decrease Outcome: Progressing   Problem: Elimination: Goal: Will not experience complications related to bowel motility Outcome: Progressing Goal: Will not experience complications related to urinary retention Outcome: Progressing   Problem: Pain Managment: Goal: General experience of comfort will improve and/or be controlled Outcome: Progressing   Problem: Safety: Goal: Ability to remain free from injury will improve Outcome: Progressing   Problem: Skin Integrity: Goal: Risk for impaired skin integrity will decrease Outcome: Progressing   Problem: Education: Goal: Understanding of cardiac disease, CV risk reduction, and recovery process will improve Outcome: Progressing Goal: Individualized Educational Video(s) Outcome: Progressing   Problem: Activity: Goal: Ability to tolerate increased activity will improve Outcome: Progressing   Problem: Cardiac: Goal: Ability to achieve and maintain adequate cardiovascular  perfusion will improve Outcome: Progressing   Problem: Health Behavior/Discharge Planning: Goal: Ability to safely manage health-related needs after discharge will improve Outcome: Progressing   Problem: Education: Goal: Understanding of CV disease, CV risk reduction, and recovery process will improve Outcome: Progressing Goal: Individualized Educational Video(s) Outcome: Progressing   Problem: Activity: Goal: Ability to return to baseline activity level will improve Outcome: Progressing   Problem: Cardiovascular: Goal: Ability to achieve and maintain adequate cardiovascular perfusion will improve Outcome: Progressing Goal: Vascular access site(s) Level 0-1 will be maintained Outcome: Progressing   Problem: Health Behavior/Discharge Planning: Goal: Ability to safely manage health-related needs after discharge will improve Outcome: Progressing   Problem: Education: Goal: Ability to describe self-care measures that may prevent or decrease complications (Diabetes Survival Skills Education) will improve Outcome: Progressing Goal: Individualized Educational Video(s) Outcome: Progressing   Problem: Coping: Goal: Ability to adjust to condition or change in health will improve Outcome: Progressing   Problem: Fluid Volume: Goal: Ability to maintain a balanced intake and output will improve Outcome: Progressing   Problem: Health Behavior/Discharge Planning: Goal: Ability to identify and utilize available resources and services will improve Outcome: Progressing Goal: Ability to manage health-related needs will improve Outcome: Progressing   Problem: Metabolic: Goal: Ability to maintain appropriate glucose levels will improve Outcome: Progressing   Problem: Nutritional: Goal: Maintenance of adequate nutrition will improve Outcome: Progressing Goal: Progress toward achieving an optimal weight will improve Outcome: Progressing   Problem: Skin Integrity: Goal: Risk for  impaired skin integrity will decrease Outcome: Progressing   Problem: Tissue Perfusion: Goal: Adequacy of tissue perfusion will improve Outcome: Progressing

## 2024-02-20 NOTE — Progress Notes (Signed)
 Advanced Heart Failure Rounding Note  Cardiologist: None  Chief Complaint: Cardiogenic Shock   Subjective:   5/26 Late presenting anterolateral STEMI s/p PCI/DES to LAD 5/27 CO-OX 29%. Started on 0.25 milrinone .  - No acute events overnight.  - 1300cc urine output q24h, negative 644.    Objective:   Weight Range: 99.1 kg Body mass index is 33.22 kg/m.   Vital Signs:   Temp:  [98.2 F (36.8 C)-98.7 F (37.1 C)] 98.3 F (36.8 C) (05/31 1200) Pulse Rate:  [87-110] 110 (05/31 1100) Resp:  [15-35] 18 (05/31 1100) BP: (74-112)/(55-86) 109/71 (05/31 1100) SpO2:  [89 %-100 %] 100 % (05/31 1100) Weight:  [99.1 kg] 99.1 kg (05/31 0917) Last BM Date : 02/18/24  Weight change: Filed Weights   02/18/24 0500 02/19/24 0500 02/20/24 0917  Weight: 99 kg 97.6 kg 99.1 kg    Intake/Output:   Intake/Output Summary (Last 24 hours) at 02/20/2024 1346 Last data filed at 02/20/2024 1000 Gross per 24 hour  Intake 603.45 ml  Output 850 ml  Net -246.55 ml      Physical Exam  General:  sitting up in chair Cor: RRR, systolic murmur.  Abdomen: soft, nondistended.  Extremities:no edema Neuro: alert & orientedx3. Affect pleasant    Telemetry  NSR 80-90s  EKG   N/A  Labs    CBC Recent Labs    02/19/24 1724 02/20/24 0844  WBC 13.8* 11.7*  HGB 7.8* 7.6*  HCT 26.5* 26.8*  MCV 82.6 83.8  PLT 262 273   Basic Metabolic Panel Recent Labs    16/10/96 0431 02/20/24 0500  NA 131* 131*  K 3.8 3.9  CL 100 103  CO2 21* 20*  GLUCOSE 204* 189*  BUN 28* 27*  CREATININE 1.80* 1.75*  CALCIUM  7.1* 7.0*  MG 2.0 2.1     Other results:   Imaging   - No new imaging   Medications:     Scheduled Medications:  sodium chloride    Intravenous Once   aspirin  EC  81 mg Oral Daily   bisacodyl   10 mg Rectal Once   Chlorhexidine  Gluconate Cloth  6 each Topical Daily   clopidogrel   75 mg Oral Q breakfast   colchicine   0.6 mg Oral Daily   digoxin   0.125 mg Oral Daily    insulin  aspart  0-15 Units Subcutaneous TID WC   insulin  aspart  0-5 Units Subcutaneous QHS   levothyroxine   150 mcg Oral Q0600   pantoprazole   40 mg Oral Daily   polyethylene glycol  17 g Oral Daily   rosuvastatin   40 mg Oral Daily   senna-docusate  1 tablet Oral Daily   sodium chloride  flush  10-40 mL Intracatheter Q12H   sodium chloride  flush  3 mL Intravenous Q12H    Infusions:  heparin  1,350 Units/hr (02/20/24 1000)   milrinone  0.125 mcg/kg/min (02/20/24 1000)    PRN Medications: acetaminophen , diazepam , methocarbamol , morphine  injection, nitroGLYCERIN , mouth rinse, sodium chloride  flush, sodium chloride  flush    Patient Profile   [name] is a 73 year old with a history of HTN, HLD, and tobacco abuse. Admitted with late presenting anterior STEMI c/b cardiogenic shock.  Assessment/Plan  1. Anterior STEMI with CAD -HS Trop >24K. Cath with 100% p to m LAD treated with PTCA/DES. 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  -Plan DAPT x 1 year. ASA + Plavix .  -Continue crestor  40 mg daily  -No chest pain   2. Cardiogenic Shock  Acute HFrEF/ICM - Echo EF 30%  on admit.  - Echo 5/27 EF 15-20% RV normal with severe AS.  - Milrinone  down to 0.137mcg/kg/min with mixed venous of 58%, Hgb 7.5.  - Repeat mixed venous; will plan to D/C milrinone  later today if possible.  - Eventual RHC prior to discharge   3. Aortic valve stenosis - Echo 05/27: severely calcified aortic valve with restricted leaflets, AVA 0.81 cm2 an, MG 6 and DI 0.24, concering for low flow low gradient AS. -Discussed with Dr Abel Hoe. Plan to follow up in [facility] to addresses aortic valve.     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 CKD Stage IIIa -Unclear baseline. Scr 1.6-1.8 this admit. - sCr stable to slightly improving, 1.75 today.    6. Hypothyroid -TSH 6.  -On Levothyroxine .   7. Iron  deficiency anemia - 1 u RBCs on  05/29 - Hgb 7.8 this am. CBC q 8hrs today. 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 Haptoglobin/LDH/vWF factor tomorrow AM.   8. Gout flare - Involving left ankle and great toe - Give 1.2 mg colchicine  today followed by 0.6 mg X 3 days.    Length of Stay: 5  Mark Hendrickson, DO  02/20/2024, 1:46 PM  Advanced Heart Failure Team Pager [phone] (M-F; 7a - 5p)  Please contact [facility] for night-coverage after hours (5p -7a ) and weekends on [email or web address] CRITICAL CARE Performed by: Alwin Baars   Total critical care time: 35 minutes  Critical care time was exclusive of separately billable procedures and treating other patients.  Critical care was necessary to treat or prevent imminent or life-threatening deterioration.  Critical care was time spent personally by me on the following activities: development of treatment plan with patient and/or surrogate as well as nursing, discussions with consultants, evaluation of patient's response to treatment, examination of patient, obtaining history from patient or surrogate, ordering and performing treatments and interventions, ordering and review of laboratory studies, ordering and review of radiographic studies, pulse oximetry and re-evaluation of patient's condition.

## 2024-02-21 LAB — BASIC METABOLIC PANEL WITH GFR
Anion gap: 7 (ref 5–15)
BUN: 27 mg/dL — ABNORMAL HIGH (ref 8–23)
CO2: 20 mmol/L — ABNORMAL LOW (ref 22–32)
Calcium: 7.6 mg/dL — ABNORMAL LOW (ref 8.9–10.3)
Chloride: 106 mmol/L (ref 98–111)
Creatinine, Ser: 1.58 mg/dL — ABNORMAL HIGH (ref 0.61–1.24)
GFR, Estimated: 46 mL/min — ABNORMAL LOW (ref >60)
Glucose, Bld: 175 mg/dL — ABNORMAL HIGH (ref 70–99)
Potassium: 3.9 mmol/L (ref 3.5–5.1)
Sodium: 133 mmol/L — ABNORMAL LOW (ref 135–145)

## 2024-02-21 LAB — COOXEMETRY PANEL
Carboxyhemoglobin: 1.4 % (ref 0.5–1.5)
Carboxyhemoglobin: 2.6 % — ABNORMAL HIGH (ref 0.5–1.5)
Carboxyhemoglobin: 3.6 % — ABNORMAL HIGH (ref 0.5–1.5)
Methemoglobin: <0.7 % (ref 0.0–1.5)
Methemoglobin: 1 % (ref 0.0–1.5)
Methemoglobin: 1.5 % (ref 0.0–1.5)
O2 Saturation: 58.6 %
O2 Saturation: 49.9 %
O2 Saturation: 66.5 %
Total hemoglobin: 8.2 g/dL — ABNORMAL LOW (ref 12.0–16.0)
Total hemoglobin: 8.5 g/dL — ABNORMAL LOW (ref 12.0–16.0)
Total hemoglobin: 8.6 g/dL — ABNORMAL LOW (ref 12.0–16.0)

## 2024-02-21 LAB — CBC
HCT: 26.6 % — ABNORMAL LOW (ref 39.0–52.0)
Hemoglobin: 7.8 g/dL — ABNORMAL LOW (ref 13.0–17.0)
MCH: 24.5 pg — ABNORMAL LOW (ref 26.0–34.0)
MCHC: 29.3 g/dL — ABNORMAL LOW (ref 30.0–36.0)
MCV: 83.6 fL (ref 80.0–100.0)
Platelets: 283 10*3/uL (ref 150–400)
RBC: 3.18 MIL/uL — ABNORMAL LOW (ref 4.22–5.81)
RDW: 18.4 % — ABNORMAL HIGH (ref 11.5–15.5)
WBC: 12.1 10*3/uL — ABNORMAL HIGH (ref 4.0–10.5)
nRBC: 0 % (ref 0.0–0.2)

## 2024-02-21 LAB — GLUCOSE, CAPILLARY
Glucose-Capillary: 232 mg/dL — ABNORMAL HIGH (ref 70–99)
Glucose-Capillary: 207 mg/dL — ABNORMAL HIGH (ref 70–99)
Glucose-Capillary: 168 mg/dL — ABNORMAL HIGH (ref 70–99)
Glucose-Capillary: 185 mg/dL — ABNORMAL HIGH (ref 70–99)

## 2024-02-21 LAB — HEPARIN LEVEL (UNFRACTIONATED)
Heparin Unfractionated: 0.17 [IU]/mL — ABNORMAL LOW (ref 0.30–0.70)
Heparin Unfractionated: 0.24 [IU]/mL — ABNORMAL LOW (ref 0.30–0.70)

## 2024-02-21 LAB — LACTATE DEHYDROGENASE: LDH: 452 U/L — ABNORMAL HIGH (ref 98–192)

## 2024-02-21 LAB — MAGNESIUM: Magnesium: 2.3 mg/dL (ref 1.7–2.4)

## 2024-02-21 NOTE — Progress Notes (Signed)
 PHARMACY - ANTICOAGULATION CONSULT NOTE  Pharmacy Consult for heparin   Indication:  LV thrombus  No Known Allergies  Patient Measurements: Height: 5\' 8"  (172.7 cm) Weight: 98.2 kg (216 lb 6.4 oz) IBW/kg (Calculated) : 68.4 HEPARIN  DW (KG): 90.4  Vital Signs: Temp: 98.3 F (36.8 C) (06/01 1130) Temp Source: Oral (06/01 1130) BP: 109/58 (06/01 1000) Pulse Rate: 88 (06/01 1000)  Labs: Recent Labs    02/19/24 0431 02/19/24 1300 02/20/24 0500 02/20/24 0844 02/20/24 1337 02/20/24 1859 02/21/24 0414 02/21/24 1654  HGB 7.8*   < >  --    < > 8.4* 7.9* 7.8*  --   HCT 26.3*   < >  --    < > 29.1* 27.1* 26.6*  --   PLT 285   < >  --    < > 308 284 283  --   HEPARINUNFRC 0.32  --  0.28*  --   --   --  0.17* 0.24*  CREATININE 1.80*  --  1.75*  --   --   --  1.58*  --    < > = values in this interval not displayed.    Estimated Creatinine Clearance: 46.6 mL/min (A) (by C-G formula based on SCr of 1.58 mg/dL (H)).   Medical History: Past Medical History:  Diagnosis Date   Acute blood loss anemia    Alcohol dependence (HCC)    Alcoholic hepatitis    Dysrhythmia    a. 08/2016 -?afib, not clear. b. 09/2017 - official dx atrial flutter.   E. coli UTI 09/22/2016   GERD (gastroesophageal reflux disease)    Gout    Hematochezia    Hyperglycemia    Hypertension    Hypokalemia    Hypothyroidism    Thrombocytopenia (HCC)    Assessment: 73 yo male here with STEMI and s/p cath with DES to the LAD and possible LV thrombus. Pharmacy consulted to dose heparin  for ACS. No anticoagulants noted PTA.  Heparin  level was supratherapeutic earlier and rate was appropriately reduced. Hgb came back low at 6.8 - plan for PRBCs. Was complaining of abdominal pain - no evidence of bleeding. Plan to hold heparin  infusion until repeat CBC after PRBC.   Hgb improved to 8.4 after 1 PRBC. Had bowel movement without any noticeable bleeding per RN. Discussed with team and will restart heparin   Will narrow  goal range to 0.3-0.5 given anemia.  PM update: heparin  level remains subtherapeutic at 0.24.  Goal of Therapy:  Heparin  level 0.3-0.5 Monitor platelets by anticoagulation protocol: Yes   Plan:  Increase heparin  infusion to 1650 units/h Recheck heparin  level with am labs  Levin Reamer, PharmD, BCPS, [facility] Clinical Pharmacist [phone] Please check AMION for all [facility] Pharmacy numbers 02/21/2024

## 2024-02-21 NOTE — Progress Notes (Signed)
 Advanced Heart Failure Rounding Note  Cardiologist: None  Chief Complaint: Cardiogenic Shock   Subjective:   5/26 Late presenting anterolateral STEMI s/p PCI/DES to LAD 5/27 CO-OX 29%. Started on 0.25 milrinone .  - No acute events overnight -1400 cc urine output yesterday   Objective:   Weight Range: 98.2 kg Body mass index is 32.9 kg/m.   Vital Signs:   Temp:  [98.2 F (36.8 C)-98.4 F (36.9 C)] 98.3 F (36.8 C) (06/01 1130) Pulse Rate:  [84-111] 88 (06/01 1000) Resp:  [16-33] 33 (06/01 1000) BP: (94-136)/(45-98) 109/58 (06/01 1000) SpO2:  [92 %-100 %] 96 % (06/01 1000) Weight:  [98.2 kg] 98.2 kg (06/01 0600) Last BM Date : 02/20/24  Weight change: Filed Weights   02/19/24 0500 02/20/24 0917 02/21/24 0600  Weight: 97.6 kg 99.1 kg 98.2 kg    Intake/Output:   Intake/Output Summary (Last 24 hours) at 02/21/2024 1137 Last data filed at 02/21/2024 0800 Gross per 24 hour  Intake 1253.68 ml  Output 1400 ml  Net -146.32 ml      Physical Exam  General:  sitting up in chair Cor: RRR; systolic murmur.  Abdomen: NAD Extremities: no edema Neuro:AAOX4    Telemetry  NSR 70s  EKG   N/A  Labs    CBC Recent Labs    02/20/24 1859 02/21/24 0414  WBC 13.3* 12.1*  HGB 7.9* 7.8*  HCT 27.1* 26.6*  MCV 83.1 83.6  PLT 284 283   Basic Metabolic Panel Recent Labs    16/10/96 0500 02/21/24 0414  NA 131* 133*  K 3.9 3.9  CL 103 106  CO2 20* 20*  GLUCOSE 189* 175*  BUN 27* 27*  CREATININE 1.75* 1.58*  CALCIUM  7.0* 7.6*  MG 2.1 2.3     Other results:   Imaging   - No new imaging   Medications:     Scheduled Medications:  sodium chloride    Intravenous Once   aspirin  EC  81 mg Oral Daily   bisacodyl   10 mg Rectal Once   Chlorhexidine  Gluconate Cloth  6 each Topical Daily   clopidogrel   75 mg Oral Q breakfast   colchicine   0.6 mg Oral Daily   digoxin   0.125 mg Oral Daily   insulin  aspart  0-15 Units Subcutaneous TID WC   insulin  aspart   0-5 Units Subcutaneous QHS   levothyroxine   150 mcg Oral Q0600   pantoprazole   40 mg Oral Daily   polyethylene glycol  17 g Oral Daily   rosuvastatin   40 mg Oral Daily   senna-docusate  1 tablet Oral Daily   sodium chloride  flush  10-40 mL Intracatheter Q12H   sodium chloride  flush  10-40 mL Intracatheter Q12H   sodium chloride  flush  3 mL Intravenous Q12H    Infusions:  heparin  1,500 Units/hr (02/21/24 0806)   milrinone  0.125 mcg/kg/min (02/21/24 0700)    PRN Medications: acetaminophen , diazepam , methocarbamol , morphine  injection, nitroGLYCERIN , mouth rinse, sodium chloride  flush, sodium chloride  flush, sodium chloride  flush    Patient Profile   Mark Meadows is a 73 year old with a history of HTN, HLD, and tobacco abuse. Admitted with late presenting anterior STEMI c/b cardiogenic shock.  Assessment/Plan  1. Anterior STEMI with CAD -HS Trop >24K. Cath with 100% p to m LAD treated with PTCA/DES. 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  -Plan DAPT x 1 year. ASA + Plavix .  -Continue crestor  40 mg daily  -No chest pain; feels well.    2. Cardiogenic Shock  Acute HFrEF/ICM - Echo EF 30% on admit.  - Echo 5/27 EF 15-20% RV normal with severe AS.  - Euvolemic on exam; D/C milrinone  this AM. Repeat mixed venous later this evening.  - Plan for RHC tomorrow to re-assess hemodynamics.    3. Aortic valve stenosis - Echo 05/27: severely calcified aortic valve with restricted leaflets, AVA 0.81 cm2 an, MG 6 and DI 0.24, concering for low flow low gradient AS. -Discussed with Dr Abel Hoe. Plan to follow up in [facility] to addresses aortic valve.     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    5. CKD Stage IIIa -Unclear baseline. Scr 1.6-1.8 this admit. - sCr stable to slightly improving, 1.58   6. Hypothyroid -TSH 6.  -On Levothyroxine .   7. Iron  deficiency anemia - 1 u RBCs on 05/29 - Hgb 7.8 this am. CBC q 8hrs today. 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   8. Gout flare - Involving left ankle and great toe - 1.2 mg colchicine  followed by 0.6 mg X 3 days. - Improving    Length of Stay: 6  Hussein Macdougal, DO  02/21/2024, 11:37 AM  Advanced Heart Failure Team Pager [phone] (M-F; 7a - 5p)  Please contact [facility] for night-coverage after hours (5p -7a ) and weekends on [email or web address]  CRITICAL CARE Performed by: Alwin Baars   Total critical care time: 37 minutes  Critical care time was exclusive of separately billable procedures and treating other patients.  Critical care was necessary to treat or prevent imminent or life-threatening deterioration.  Critical care was time spent personally by me on the following activities: development of treatment plan with patient and/or surrogate as well as nursing, discussions with consultants, evaluation of patient's response to treatment, examination of patient, obtaining history from patient or surrogate, ordering and performing treatments and interventions, ordering and review of laboratory studies, ordering and review of radiographic studies, pulse oximetry and re-evaluation of patient's condition.

## 2024-02-21 NOTE — Progress Notes (Signed)
 PHARMACY - ANTICOAGULATION Pharmacy Consult for heparin   Indication: LV thrombus Brief A/P: Heparin  level subtherapeutic Increase Heparin  rate  No Known Allergies  Patient Measurements: Height: 5\' 8"  (172.7 cm) Weight: 99.1 kg (218 lb 7.6 oz) IBW/kg (Calculated) : 68.4 HEPARIN  DW (KG): 90.4  Vital Signs: Temp: 98.2 F (36.8 C) (06/01 0400) Temp Source: Oral (06/01 0400) BP: 102/45 (06/01 0500) Pulse Rate: 92 (06/01 0500)  Labs: Recent Labs    02/19/24 0431 02/19/24 1300 02/20/24 0500 02/20/24 0844 02/20/24 1337 02/20/24 1859 02/21/24 0414  HGB 7.8*   < >  --    < > 8.4* 7.9* 7.8*  HCT 26.3*   < >  --    < > 29.1* 27.1* 26.6*  PLT 285   < >  --    < > 308 284 283  HEPARINUNFRC 0.32  --  0.28*  --   --   --  0.17*  CREATININE 1.80*  --  1.75*  --   --   --  1.58*   < > = values in this interval not displayed.    Estimated Creatinine Clearance: 46.8 mL/min (A) (by C-G formula based on SCr of 1.58 mg/dL (H)).  Assessment: 73 yo male with STEMI  s/p cath/PCI, possible LV thrombus for heparin   Goal of Therapy:  Heparin  level 0.3-0.7 units/ml Monitor platelets by anticoagulation protocol: Yes   Plan:  Increase Heparin  1500 units/hr Check heparin  level in 8 hours.   Claudine Cullens, PharmD, BCPS

## 2024-02-21 NOTE — TOC Progression Note (Signed)
 Transition of Care [facility]) - Progression Note    Patient Details  Name: Mark Meadows MRN: [id number] Date of Birth: 02-26-1951  [facility]) CM/SW Contact  Carmon Christen, LCSWA Phone Number: 02/21/2024, 1:13 PM  Clinical Narrative:     CSW received consult for possible SNF placement at time of discharge. CSW spoke with patient regarding PT recommendation of SNF placement at time of discharge. Patient reports he comes from home alone.Patient expressed understanding of PT recommendation and politely declined SNF placement at time of discharge. CSW informed patient that CM to follow up closer to being medically stable to assist with any home needs. Patient thanked CSW. TOC will continue to follow and assist with discharge planning needs.   Expected Discharge Plan: Home w Home Health Services Barriers to Discharge: Continued Medical Work up  Expected Discharge Plan and Services   Discharge Planning Services: CM Consult Post Acute Care Choice: Home Health Living arrangements for the past 2 months: Single Family Home                                       Social Determinants of Health (SDOH) Interventions SDOH Screenings   Food Insecurity: No Food Insecurity (02/15/2024)  Housing: Low Risk  (02/15/2024)  Transportation Needs: No Transportation Needs (02/15/2024)  Utilities: Not At Risk (02/15/2024)  Financial Resource Strain: Low Risk  (12/17/2022)   Received from [facility]  Physical Activity: Inactive (02/07/2022)   Received from [facility], [facility]  Social Connections: Moderately Isolated (02/15/2024)  Stress: No Stress Concern Present (02/07/2022)   Received from [facility], [facility]  Tobacco Use: Medium Risk (02/16/2024)    Readmission Risk Interventions     No data to display

## 2024-02-22 ENCOUNTER — Encounter (HOSPITAL_COMMUNITY)
Admission: EM | Disposition: A | Discharge status: Home / Self Care | Payer: Self-pay | Source: Home / Self Care | Attending: Cardiovascular Disease

## 2024-02-22 ENCOUNTER — Encounter (HOSPITAL_COMMUNITY): Payer: Self-pay | Admitting: Cardiology

## 2024-02-22 DIAGNOSIS — I509 Heart failure, unspecified: Secondary | ICD-10-CM

## 2024-02-22 DIAGNOSIS — R570 Cardiogenic shock: Secondary | ICD-10-CM | POA: Diagnosis not present

## 2024-02-22 DIAGNOSIS — I2102 ST elevation (STEMI) myocardial infarction involving left anterior descending coronary artery: Secondary | ICD-10-CM | POA: Diagnosis not present

## 2024-02-22 DIAGNOSIS — I5021 Acute systolic (congestive) heart failure: Secondary | ICD-10-CM | POA: Diagnosis not present

## 2024-02-22 HISTORY — PX: RIGHT HEART CATH: CATH118263

## 2024-02-22 LAB — POCT I-STAT 7, (LYTES, BLD GAS, ICA,H+H)
Acid-base deficit: 5 mmol/L — ABNORMAL HIGH (ref 0.0–2.0)
Acid-base deficit: 4 mmol/L — ABNORMAL HIGH (ref 0.0–2.0)
Bicarbonate: 19.1 mmol/L — ABNORMAL LOW (ref 20.0–28.0)
Bicarbonate: 19.2 mmol/L — ABNORMAL LOW (ref 20.0–28.0)
Calcium, Ion: 1.09 mmol/L — ABNORMAL LOW (ref 1.15–1.40)
Calcium, Ion: 1.13 mmol/L — ABNORMAL LOW (ref 1.15–1.40)
HCT: 28 % — ABNORMAL LOW (ref 39.0–52.0)
HCT: 28 % — ABNORMAL LOW (ref 39.0–52.0)
Hemoglobin: 9.5 g/dL — ABNORMAL LOW (ref 13.0–17.0)
Hemoglobin: 9.5 g/dL — ABNORMAL LOW (ref 13.0–17.0)
O2 Saturation: 48 %
O2 Saturation: 47 %
Potassium: 4.1 mmol/L (ref 3.5–5.1)
Potassium: 4.2 mmol/L (ref 3.5–5.1)
Sodium: 138 mmol/L (ref 135–145)
Sodium: 137 mmol/L (ref 135–145)
TCO2: 20 mmol/L — ABNORMAL LOW (ref 22–32)
TCO2: 20 mmol/L — ABNORMAL LOW (ref 22–32)
pCO2 arterial: 29.9 mmHg — ABNORMAL LOW (ref 32–48)
pCO2 arterial: 29.6 mmHg — ABNORMAL LOW (ref 32–48)
pH, Arterial: 7.413 (ref 7.35–7.45)
pH, Arterial: 7.42 (ref 7.35–7.45)
pO2, Arterial: 25 mmHg — CL (ref 83–108)
pO2, Arterial: 25 mmHg — CL (ref 83–108)

## 2024-02-22 LAB — BASIC METABOLIC PANEL WITH GFR
Anion gap: 7 (ref 5–15)
BUN: 26 mg/dL — ABNORMAL HIGH (ref 8–23)
CO2: 20 mmol/L — ABNORMAL LOW (ref 22–32)
Calcium: 7.6 mg/dL — ABNORMAL LOW (ref 8.9–10.3)
Chloride: 105 mmol/L (ref 98–111)
Creatinine, Ser: 1.59 mg/dL — ABNORMAL HIGH (ref 0.61–1.24)
GFR, Estimated: 45 mL/min — ABNORMAL LOW (ref >60)
Glucose, Bld: 177 mg/dL — ABNORMAL HIGH (ref 70–99)
Potassium: 4 mmol/L (ref 3.5–5.1)
Sodium: 132 mmol/L — ABNORMAL LOW (ref 135–145)

## 2024-02-22 LAB — CBC
HCT: 28.3 % — ABNORMAL LOW (ref 39.0–52.0)
Hemoglobin: 8 g/dL — ABNORMAL LOW (ref 13.0–17.0)
MCH: 24 pg — ABNORMAL LOW (ref 26.0–34.0)
MCHC: 28.3 g/dL — ABNORMAL LOW (ref 30.0–36.0)
MCV: 85 fL (ref 80.0–100.0)
Platelets: 311 10*3/uL (ref 150–400)
RBC: 3.33 MIL/uL — ABNORMAL LOW (ref 4.22–5.81)
RDW: 18.6 % — ABNORMAL HIGH (ref 11.5–15.5)
WBC: 11.9 10*3/uL — ABNORMAL HIGH (ref 4.0–10.5)
nRBC: 0 % (ref 0.0–0.2)

## 2024-02-22 LAB — HEPARIN LEVEL (UNFRACTIONATED): Heparin Unfractionated: 0.26 [IU]/mL — ABNORMAL LOW (ref 0.30–0.70)

## 2024-02-22 LAB — GLUCOSE, CAPILLARY
Glucose-Capillary: 190 mg/dL — ABNORMAL HIGH (ref 70–99)
Glucose-Capillary: 173 mg/dL — ABNORMAL HIGH (ref 70–99)
Glucose-Capillary: 108 mg/dL — ABNORMAL HIGH (ref 70–99)

## 2024-02-22 LAB — COOXEMETRY PANEL
Carboxyhemoglobin: 1 % (ref 0.5–1.5)
Carboxyhemoglobin: 1.2 % (ref 0.5–1.5)
Methemoglobin: <0.7 % (ref 0.0–1.5)
Methemoglobin: <0.7 % (ref 0.0–1.5)
O2 Saturation: 47.3 %
O2 Saturation: 49.7 %
Total hemoglobin: 8.5 g/dL — ABNORMAL LOW (ref 12.0–16.0)
Total hemoglobin: 8.7 g/dL — ABNORMAL LOW (ref 12.0–16.0)

## 2024-02-22 LAB — HAPTOGLOBIN: Haptoglobin: 385 mg/dL — ABNORMAL HIGH (ref 34–355)

## 2024-02-22 SURGERY — RIGHT HEART CATH
Anesthesia: LOCAL

## 2024-02-22 MED ORDER — SODIUM CHLORIDE 0.9 % IV SOLN: INTRAVENOUS | Status: DC

## 2024-02-22 MED ORDER — APIXABAN 5 MG PO TABS
5.0000 mg | ORAL_TABLET | Freq: Two times a day (BID) | ORAL | Status: AC
  Administered 2024-02-22 – 2024-02-25 (×8): 5 mg via ORAL
  Filled 2024-02-22 (×8): qty 1

## 2024-02-22 MED ORDER — FUROSEMIDE 10 MG/ML IJ SOLN
60.0000 mg | Freq: Two times a day (BID) | INTRAMUSCULAR | Status: DC
  Administered 2024-02-22: 60 mg via INTRAVENOUS
  Filled 2024-02-22: qty 6

## 2024-02-22 MED ORDER — LIDOCAINE HCL (PF) 1 % IJ SOLN
INTRAMUSCULAR | Status: AC
  Filled 2024-02-22: qty 30

## 2024-02-22 MED ORDER — FUROSEMIDE 10 MG/ML IJ SOLN
80.0000 mg | Freq: Two times a day (BID) | INTRAMUSCULAR | Status: AC
  Administered 2024-02-22: 80 mg via INTRAVENOUS
  Filled 2024-02-22: qty 8

## 2024-02-22 MED ORDER — COLCHICINE 0.6 MG PO TABS
0.6000 mg | ORAL_TABLET | Freq: Every day | ORAL | Status: DC
  Administered 2024-02-22 – 2024-02-23 (×2): 0.6 mg via ORAL
  Filled 2024-02-22 (×2): qty 1

## 2024-02-22 MED ORDER — SPIRONOLACTONE 12.5 MG HALF TABLET
12.5000 mg | ORAL_TABLET | Freq: Every day | ORAL | Status: DC
  Administered 2024-02-22 – 2024-02-27 (×6): 12.5 mg via ORAL
  Filled 2024-02-22 (×6): qty 1

## 2024-02-22 SURGICAL SUPPLY — 5 items
CATH SWAN GANZ 7F STRAIGHT (CATHETERS) IMPLANT
GLIDESHEATH SLENDER 7FR .021G (SHEATH) IMPLANT
PACK CARDIAC CATHETERIZATION (CUSTOM PROCEDURE TRAY) ×1 IMPLANT
TRANSDUCER W/STOPCOCK (MISCELLANEOUS) IMPLANT
TUBING ART PRESS 72 MALE/FEM (TUBING) IMPLANT

## 2024-02-22 NOTE — H&P (View-Only) (Signed)
 Patient ID: Mark Meadows, male   DOB: 29-Sep-1950, 73 y.o.   MRN: [id number]     Advanced Heart Failure Rounding Note  Cardiologist: None  Chief Complaint: Cardiogenic Shock   Subjective:   5/26 Late presenting anterolateral STEMI s/p PCI/DES to LAD 5/27 CO-OX 29%. Started on 0.25 milrinone .  CVP 10-11 today, developing a cough.  Co-ox 47% early am down from 66.5%.  He is now off milrinone .    Hgb stable 7.8 => 8, no overt bleeding.   Objective:   Weight Range: 98.2 kg Body mass index is 32.9 kg/m.   Vital Signs:   Temp:  [98.3 F (36.8 C)] 98.3 F (36.8 C) (06/01 1130) Pulse Rate:  [81-113] 81 (06/02 0600) Resp:  [13-35] 13 (06/02 0600) BP: (74-129)/(58-106) 98/87 (06/02 0600) SpO2:  [94 %-100 %] 100 % (06/02 0600) Last BM Date : 02/20/24  Weight change: Filed Weights   02/19/24 0500 02/20/24 0917 02/21/24 0600  Weight: 97.6 kg 99.1 kg 98.2 kg    Intake/Output:   Intake/Output Summary (Last 24 hours) at 02/22/2024 0717 Last data filed at 02/22/2024 0600 Gross per 24 hour  Intake 434.34 ml  Output 1500 ml  Net -1065.66 ml      Physical Exam   General: NAD Neck: JVP 10 cm, no thyromegaly or thyroid nodule.  Lungs: Clear to auscultation bilaterally with normal respiratory effort. CV: Nondisplaced PMI.  Heart regular S1/S2, no S3/S4, 3/6 SEM RUSB with obscured S2.  No peripheral edema.   Abdomen: Soft, nontender, no hepatosplenomegaly, no distention.  Skin: Intact without lesions or rashes.  Neurologic: Alert and oriented x 3.  Psych: Normal affect. Extremities: No clubbing or cyanosis.  HEENT: Normal.   Telemetry  NSR 100s (personally reviewed)  EKG   N/A  Labs    CBC Recent Labs    02/21/24 0414 02/22/24 0510  WBC 12.1* 11.9*  HGB 7.8* 8.0*  HCT 26.6* 28.3*  MCV 83.6 85.0  PLT 283 311   Basic Metabolic Panel Recent Labs    65/78/46 0500 02/21/24 0414 02/22/24 0510  NA 131* 133* 132*  K 3.9 3.9 4.0  CL 103 106 105  CO2 20* 20* 20*   GLUCOSE 189* 175* 177*  BUN 27* 27* 26*  CREATININE 1.75* 1.58* 1.59*  CALCIUM  7.0* 7.6* 7.6*  MG 2.1 2.3  --      Other results:   Imaging   - No new imaging   Medications:     Scheduled Medications:  sodium chloride    Intravenous Once   aspirin  EC  81 mg Oral Daily   bisacodyl   10 mg Rectal Once   Chlorhexidine  Gluconate Cloth  6 each Topical Daily   clopidogrel   75 mg Oral Q breakfast   colchicine   0.6 mg Oral Daily   digoxin   0.125 mg Oral Daily   furosemide   60 mg Intravenous BID   insulin  aspart  0-15 Units Subcutaneous TID WC   insulin  aspart  0-5 Units Subcutaneous QHS   levothyroxine   150 mcg Oral Q0600   pantoprazole   40 mg Oral Daily   polyethylene glycol  17 g Oral Daily   rosuvastatin   40 mg Oral Daily   senna-docusate  1 tablet Oral Daily   sodium chloride  flush  10-40 mL Intracatheter Q12H   sodium chloride  flush  10-40 mL Intracatheter Q12H   sodium chloride  flush  3 mL Intravenous Q12H   spironolactone   12.5 mg Oral Daily    Infusions:  heparin  1,650 Units/hr (  02/22/24 0138)    PRN Medications: acetaminophen , diazepam , methocarbamol , morphine  injection, nitroGLYCERIN , mouth rinse, sodium chloride  flush, sodium chloride  flush, sodium chloride  flush    Patient Profile   Mark Meadows is a 73 year old with a history of HTN, HLD, and tobacco abuse. Admitted with late presenting anterior STEMI c/b cardiogenic shock.  Assessment/Plan  1. Anterior STEMI with CAD -HS Trop >24K. Cath with 100% p to m LAD treated with PTCA/DES. 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  -Plan DAPT x 1 year. ASA + Plavix .  -Continue crestor  40 mg daily  -No chest pain.    2. Cardiogenic Shock: Acute HFrEF/ischemic cardiomyopathy.  - Echo EF 30% on admit.  - Echo 5/27 EF 15-20%, RV normal with severe AS.  - He is now off milrinone , co-ox 66.5% => 47%.   - CVP 10-11, has not been getting Lasix .  Developing a cough.  - Will give Lasix  60 mg IV bid today x 2 doses and  reassess CVP.  - Continue digoxin  0.125 and will add spironolactone  12.5 daily.  Check digoxin  level tomorrow.  - Will resend co-ox and will plan RHC today to assess hemodynamics off milrinone .  If low CO, will need to restart milrinone  and consider inpatient TAVR.  Discussed risks/benefits of RHC with patient and he agrees to procedure.    3. Aortic valve stenosis - Echo 05/27: severely calcified aortic valve with restricted leaflets, AVA 0.81 cm2 an, MG 16 and DI 0.24, concerning for low flow low gradient AS. -Discussed with Dr Abel Hoe. Initial plan to follow up in [facility] to addresses aortic valve.  However, if he has to go back on milrinone , may need to consider TAVR as inpatient.    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     5. CKD Stage IIIa -Unclear baseline. Scr 1.6-1.8 this admit. - sCr stable, 1.58 => 1.59   6. Hypothyroid -TSH 6.  -On Levothyroxine .   7. Iron  deficiency anemia - 1 u RBCs on 05/29 - Hgb 7.8 => 8 this am.  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   8. Gout flare - Involving left ankle and great toe - 1.2 mg colchicine  followed by 0.6 mg daily - Improving  Mobilize.   Length of Stay: 7  Peder Bourdon, MD  02/22/2024, 7:17 AM  Advanced Heart Failure Team Pager [phone] (M-F; 7a - 5p)  Please contact [facility] for night-coverage after hours (5p -7a ) and weekends on [email or web address]  CRITICAL CARE Performed by: Peder Bourdon   Total critical care time: 40 minutes  Critical care time was exclusive of separately billable procedures and treating other patients.  Critical care was necessary to treat or prevent imminent or life-threatening deterioration.  Critical care was time spent personally by me on the following activities: development of treatment plan with patient  and/or surrogate as well as nursing, discussions with consultants, evaluation of patient's response to treatment, examination of patient, obtaining history from patient or surrogate, ordering and performing treatments and interventions, ordering and review of laboratory studies, ordering and review of radiographic studies, pulse oximetry and re-evaluation of patient's condition.

## 2024-02-22 NOTE — Interval H&P Note (Signed)
 History and Physical Interval Note:  02/22/2024 9:17 AM  Mark Meadows  has presented today for surgery, with the diagnosis of heart failure.  The various methods of treatment have been discussed with the patient and family. After consideration of risks, benefits and other options for treatment, the patient has consented to  Procedure(s): RIGHT HEART CATH (N/A) as a surgical intervention.  The patient's history has been reviewed, patient examined, no change in status, stable for surgery.  I have reviewed the patient's chart and labs.  Questions were answered to the patient's satisfaction.     Nasif Bos [employer]

## 2024-02-22 NOTE — Evaluation (Signed)
 Occupational Therapy Evaluation Patient Details Name: Mark Meadows MRN: [id number] DOB: 1950/12/26 Today's Date: 02/22/2024   History of Present Illness   73 y.o. male presents to [facility] 02/15/24 as transfer from [facility] ED for evaluation of STEMI, stent placed. Complicated by cardiogenic shock. PMHx: HTN, gout, thrombocytopenia, anemia, dysrhythmia, CKD stage 3a.  Heart Cath procedure 02/22/24.     Clinical Impressions Patient admitted for the diagnosis above.  PTA he lives at home with a friend, who can provide supportive assist.  Patient assessed s/p heart cath and still a little sleepy, but up and moving better with less expressed L leg pain.  Patient currently needing up to Min A for mobility and lower body ADL from a sit to stand level.  OT will continue efforts in the acute setting to address deficits, and no post acute OT is anticipated.       If plan is discharge home, recommend the following:   Assist for transportation;Assistance with cooking/housework     Functional Status Assessment   Patient has had a recent decline in their functional status and demonstrates the ability to make significant improvements in function in a reasonable and predictable amount of time.     Equipment Recommendations   None recommended by OT     Recommendations for Other Services         Precautions/Restrictions   Precautions Precautions: Fall Precaution/Restrictions Comments: watch O2 Restrictions Weight Bearing Restrictions Per Provider Order: No     Mobility Bed Mobility Overal bed mobility: Needs Assistance Bed Mobility: Supine to Sit, Sit to Supine     Supine to sit: Supervision Sit to supine: Supervision        Transfers Overall transfer level: Needs assistance Equipment used: Rolling walker (2 wheels) Transfers: Sit to/from Stand, Bed to chair/wheelchair/BSC Sit to Stand: Contact guard assist     Step pivot transfers: Contact guard assist            Balance  Overall balance assessment: Needs assistance Sitting-balance support: No upper extremity supported, Feet supported Sitting balance-Leahy Scale: Good     Standing balance support: Reliant on assistive device for balance Standing balance-Leahy Scale: Poor                             ADL either performed or assessed with clinical judgement   ADL       Grooming: Wash/dry hands;Wash/dry face;Set up;Sitting               Lower Body Dressing: Minimal assistance;Sit to/from stand   Toilet Transfer: Ambulance person;Ambulation;Rolling walker (2 wheels)                   Vision Patient Visual Report: No change from baseline       Perception Perception: Within Functional Limits       Praxis Praxis: WFL       Pertinent Vitals/Pain Pain Assessment Faces Pain Scale: Hurts even more Pain Location: L ankle and knee Pain Descriptors / Indicators: Grimacing, Guarding, Sharp Pain Intervention(s): Monitored during session     Extremity/Trunk Assessment Upper Extremity Assessment Upper Extremity Assessment: Overall WFL for tasks assessed   Lower Extremity Assessment Lower Extremity Assessment: Defer to PT evaluation   Cervical / Trunk Assessment Cervical / Trunk Assessment: Normal   Communication Communication Communication: No apparent difficulties Factors Affecting Communication: Hearing impaired   Cognition Arousal: Alert Behavior During Therapy: WFL for tasks assessed/performed Cognition: No  apparent impairments                               Following commands: Intact       Cueing  General Comments   Cueing Techniques: Verbal cues   VSS on RA   Exercises     Shoulder Instructions      Home Living Family/patient expects to be discharged to:: Private residence Living Arrangements: Non-relatives/Friends Available Help at Discharge: Friend(s);Available PRN/intermittently Type of Home: House Home Access:  Stairs to enter [employer] of Steps: 4 Entrance Stairs-Rails: Right;Left Home Layout: One level     Bathroom Shower/Tub: Chief Strategy Officer: Handicapped height Bathroom Accessibility: Yes   Home Equipment: Agricultural consultant (2 wheels);Cane - single point;Grab bars - tub/shower   Additional Comments: Sleeps in recliner      Prior Functioning/Environment Prior Level of Function : Independent/Modified Independent;Driving             Mobility Comments: Reports being mostly sedentary. Independent for mobility with no AD. Denies recent falls. ADLs Comments: Ind with ADLs and iADLs. Occasionally drives    OT Problem List: Decreased activity tolerance;Impaired balance (sitting and/or standing);Pain   OT Treatment/Interventions: Self-care/ADL training;Therapeutic activities;Patient/family education;DME and/or AE instruction      OT Goals(Current goals can be found in the care plan section)   Acute Rehab OT Goals Patient Stated Goal: Return home OT Goal Formulation: With patient Time For Goal Achievement: 03/07/24 Potential to Achieve Goals: Good ADL Goals Pt Will Perform Grooming: with modified independence;standing Pt Will Perform Lower Body Dressing: with modified independence;sit to/from stand Pt Will Transfer to Toilet: with modified independence;ambulating;regular height toilet   OT Frequency:  Min 2X/week    Co-evaluation              AM-PAC OT "6 Clicks" Daily Activity     Outcome Measure Help from another person eating meals?: None Help from another person taking care of personal grooming?: None Help from another person toileting, which includes using toliet, bedpan, or urinal?: A Little Help from another person bathing (including washing, rinsing, drying)?: A Little Help from another person to put on and taking off regular upper body clothing?: None Help from another person to put on and taking off regular lower body clothing?: A  Little 6 Click Score: 21   End of Session Equipment Utilized During Treatment: Rolling walker (2 wheels);Gait belt Nurse Communication: Mobility status  Activity Tolerance: Patient limited by pain Patient left: in bed;with call bell/phone within reach;with nursing/sitter in room  OT Visit Diagnosis: Unsteadiness on feet (R26.81);Muscle weakness (generalized) (M62.81);Pain Pain - Right/Left: Left Pain - part of body: Knee;Ankle and joints of foot                Time: 1610-9604 OT Time Calculation (min): 21 min Charges:  OT General Charges $OT Visit: 1 Visit OT Evaluation $OT Eval Moderate Complexity: 1 Mod  02/22/2024  RP, OTR/L  Acute Rehabilitation Services  Office:  [phone]   Mark Meadows 02/22/2024, 10:52 AM

## 2024-02-22 NOTE — Progress Notes (Signed)
 OT Cancellation Note  Patient Details Name: Mark Meadows MRN: [id number] DOB: June 19, 1951   Cancelled Treatment:    Reason Eval/Treat Not Completed: Patient at procedure or test/ unavailable.  Heading to the cath lab, will follow up.    [name] 02/22/2024, 8:45 AM 02/22/2024  RP, OTR/L  Acute Rehabilitation Services  Office:  [phone]

## 2024-02-22 NOTE — Progress Notes (Addendum)
 PHARMACY - ANTICOAGULATION CONSULT NOTE  Pharmacy Consult for heparin  > Eliquis   Indication:  LV thrombus  No Known Allergies  Patient Measurements: Height: 5\' 8"  (172.7 cm) Weight: 96.1 kg (211 lb 13.8 oz) IBW/kg (Calculated) : 68.4 HEPARIN  DW (KG): 90.4  Vital Signs: Temp: 97.7 F (36.5 C) (06/02 0800) Temp Source: Oral (06/02 0800) BP: 145/86 (06/02 0936) Pulse Rate: 107 (06/02 0936)  Labs: Recent Labs    02/20/24 0500 02/20/24 0844 02/20/24 1859 02/21/24 0414 02/21/24 1654 02/22/24 0510 02/22/24 0930  HGB  --    < > 7.9* 7.8*  --  8.0* 9.5*  HCT  --    < > 27.1* 26.6*  --  28.3* 28.0*  PLT  --    < > 284 283  --  311  --   HEPARINUNFRC 0.28*  --   --  0.17* 0.24* 0.26*  --   CREATININE 1.75*  --   --  1.58*  --  1.59*  --    < > = values in this interval not displayed.    Estimated Creatinine Clearance: 45.8 mL/min (A) (by C-G formula based on SCr of 1.59 mg/dL (H)).   Medical History: Past Medical History:  Diagnosis Date   Acute blood loss anemia    Alcohol dependence (HCC)    Alcoholic hepatitis    Dysrhythmia    a. 08/2016 -?afib, not clear. b. 09/2017 - official dx atrial flutter.   E. coli UTI 09/22/2016   GERD (gastroesophageal reflux disease)    Gout    Hematochezia    Hyperglycemia    Hypertension    Hypokalemia    Hypothyroidism    Thrombocytopenia (HCC)    Assessment: 73 yo male here with STEMI and s/p cath with DES to the LAD and possible LV thrombus. Pharmacy consulted to dose heparin  for ACS. No anticoagulants noted PTA.  Heparin  level was supratherapeutic earlier and rate was appropriately reduced. Hgb came back low at 6.8 - plan for PRBCs. Was complaining of abdominal pain - no evidence of bleeding. Plan to hold heparin  infusion until repeat CBC after PRBC.   Hgb improved to 8.4 after 1 PRBC. Had bowel movement without any noticeable bleeding per RN. Discussed with team and will restart heparin   Will narrow goal range to 0.3-0.5 given  anemia.  S/p RHC, Hgb stable today in 8's.  OK to transition to Eliquis  per AHF team.   Also on plavix  and aspirin  with recent LAD stent.  Plan for triple therapy x1 week after DOAC started then will stop aspirin  and continue on plavix .  Goal of Therapy:  Heparin  level 0.3-0.5 Monitor platelets by anticoagulation protocol: Yes   Plan:  STOP Heparin  infusion START Eliquis  5 mg twice daily Continue aspirin  + plavix  - stop aspirin  in 7d (entered)  Cecillia Cogan, PharmD Clinical Pharmacist 02/22/2024  11:27 AM

## 2024-02-22 NOTE — Progress Notes (Signed)
 Patient ID: Mark Meadows, male   DOB: 29-Sep-1950, 73 y.o.   MRN: [id number]     Advanced Heart Failure Rounding Note  Cardiologist: None  Chief Complaint: Cardiogenic Shock   Subjective:   5/26 Late presenting anterolateral STEMI s/p PCI/DES to LAD 5/27 CO-OX 29%. Started on 0.25 milrinone .  CVP 10-11 today, developing a cough.  Co-ox 47% early am down from 66.5%.  He is now off milrinone .    Hgb stable 7.8 => 8, no overt bleeding.   Objective:   Weight Range: 98.2 kg Body mass index is 32.9 kg/m.   Vital Signs:   Temp:  [98.3 F (36.8 C)] 98.3 F (36.8 C) (06/01 1130) Pulse Rate:  [81-113] 81 (06/02 0600) Resp:  [13-35] 13 (06/02 0600) BP: (74-129)/(58-106) 98/87 (06/02 0600) SpO2:  [94 %-100 %] 100 % (06/02 0600) Last BM Date : 02/20/24  Weight change: Filed Weights   02/19/24 0500 02/20/24 0917 02/21/24 0600  Weight: 97.6 kg 99.1 kg 98.2 kg    Intake/Output:   Intake/Output Summary (Last 24 hours) at 02/22/2024 0717 Last data filed at 02/22/2024 0600 Gross per 24 hour  Intake 434.34 ml  Output 1500 ml  Net -1065.66 ml      Physical Exam   General: NAD Neck: JVP 10 cm, no thyromegaly or thyroid nodule.  Lungs: Clear to auscultation bilaterally with normal respiratory effort. CV: Nondisplaced PMI.  Heart regular S1/S2, no S3/S4, 3/6 SEM RUSB with obscured S2.  No peripheral edema.   Abdomen: Soft, nontender, no hepatosplenomegaly, no distention.  Skin: Intact without lesions or rashes.  Neurologic: Alert and oriented x 3.  Psych: Normal affect. Extremities: No clubbing or cyanosis.  HEENT: Normal.   Telemetry  NSR 100s (personally reviewed)  EKG   N/A  Labs    CBC Recent Labs    02/21/24 0414 02/22/24 0510  WBC 12.1* 11.9*  HGB 7.8* 8.0*  HCT 26.6* 28.3*  MCV 83.6 85.0  PLT 283 311   Basic Metabolic Panel Recent Labs    65/78/46 0500 02/21/24 0414 02/22/24 0510  NA 131* 133* 132*  K 3.9 3.9 4.0  CL 103 106 105  CO2 20* 20* 20*   GLUCOSE 189* 175* 177*  BUN 27* 27* 26*  CREATININE 1.75* 1.58* 1.59*  CALCIUM  7.0* 7.6* 7.6*  MG 2.1 2.3  --      Other results:   Imaging   - No new imaging   Medications:     Scheduled Medications:  sodium chloride    Intravenous Once   aspirin  EC  81 mg Oral Daily   bisacodyl   10 mg Rectal Once   Chlorhexidine  Gluconate Cloth  6 each Topical Daily   clopidogrel   75 mg Oral Q breakfast   colchicine   0.6 mg Oral Daily   digoxin   0.125 mg Oral Daily   furosemide   60 mg Intravenous BID   insulin  aspart  0-15 Units Subcutaneous TID WC   insulin  aspart  0-5 Units Subcutaneous QHS   levothyroxine   150 mcg Oral Q0600   pantoprazole   40 mg Oral Daily   polyethylene glycol  17 g Oral Daily   rosuvastatin   40 mg Oral Daily   senna-docusate  1 tablet Oral Daily   sodium chloride  flush  10-40 mL Intracatheter Q12H   sodium chloride  flush  10-40 mL Intracatheter Q12H   sodium chloride  flush  3 mL Intravenous Q12H   spironolactone   12.5 mg Oral Daily    Infusions:  heparin  1,650 Units/hr (  02/22/24 0138)    PRN Medications: acetaminophen , diazepam , methocarbamol , morphine  injection, nitroGLYCERIN , mouth rinse, sodium chloride  flush, sodium chloride  flush, sodium chloride  flush    Patient Profile   Mark Meadows is a 73 year old with a history of HTN, HLD, and tobacco abuse. Admitted with late presenting anterior STEMI c/b cardiogenic shock.  Assessment/Plan  1. Anterior STEMI with CAD -HS Trop >24K. Cath with 100% p to m LAD treated with PTCA/DES. 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  -Plan DAPT x 1 year. ASA + Plavix .  -Continue crestor  40 mg daily  -No chest pain.    2. Cardiogenic Shock: Acute HFrEF/ischemic cardiomyopathy.  - Echo EF 30% on admit.  - Echo 5/27 EF 15-20%, RV normal with severe AS.  - He is now off milrinone , co-ox 66.5% => 47%.   - CVP 10-11, has not been getting Lasix .  Developing a cough.  - Will give Lasix  60 mg IV bid today x 2 doses and  reassess CVP.  - Continue digoxin  0.125 and will add spironolactone  12.5 daily.  Check digoxin  level tomorrow.  - Will resend co-ox and will plan RHC today to assess hemodynamics off milrinone .  If low CO, will need to restart milrinone  and consider inpatient TAVR.  Discussed risks/benefits of RHC with patient and he agrees to procedure.    3. Aortic valve stenosis - Echo 05/27: severely calcified aortic valve with restricted leaflets, AVA 0.81 cm2 an, MG 16 and DI 0.24, concerning for low flow low gradient AS. -Discussed with Dr Abel Hoe. Initial plan to follow up in Structural [facility] Clinic to addresses aortic valve.  However, if he has to go back on milrinone , may need to consider TAVR as inpatient.    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     5. CKD Stage IIIa -Unclear baseline. Scr 1.6-1.8 this admit. - sCr stable, 1.58 => 1.59   6. Hypothyroid -TSH 6.  -On Levothyroxine .   7. Iron  deficiency anemia - 1 u RBCs on 05/29 - Hgb 7.8 => 8 this am.  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   8. Gout flare - Involving left ankle and great toe - 1.2 mg colchicine  followed by 0.6 mg daily - Improving  Mobilize.   Length of Stay: 7  Peder Bourdon, MD  02/22/2024, 7:17 AM  Advanced Heart Failure Team Pager [phone] (M-F; 7a - 5p)  Please contact [facility] for night-coverage after hours (5p -7a ) and weekends on [email or web address]  CRITICAL CARE Performed by: Peder Bourdon   Total critical care time: 40 minutes  Critical care time was exclusive of separately billable procedures and treating other patients.  Critical care was necessary to treat or prevent imminent or life-threatening deterioration.  Critical care was time spent personally by me on the following activities: development of treatment plan with patient  and/or surrogate as well as nursing, discussions with consultants, evaluation of patient's response to treatment, examination of patient, obtaining history from patient or surrogate, ordering and performing treatments and interventions, ordering and review of laboratory studies, ordering and review of radiographic studies, pulse oximetry and re-evaluation of patient's condition.

## 2024-02-22 NOTE — Plan of Care (Signed)
  Problem: Education: Goal: Knowledge of General Education information will improve Description: Including pain rating scale, medication(s)/side effects and non-pharmacologic comfort measures Outcome: Progressing   Problem: Health Behavior/Discharge Planning: Goal: Ability to manage health-related needs will improve Outcome: Progressing   Problem: Clinical Measurements: Goal: Ability to maintain clinical measurements within normal limits will improve Outcome: Progressing Goal: Will remain free from infection Outcome: Progressing Goal: Diagnostic test results will improve Outcome: Progressing Goal: Respiratory complications will improve Outcome: Progressing Goal: Cardiovascular complication will be avoided Outcome: Progressing   Problem: Activity: Goal: Risk for activity intolerance will decrease Outcome: Progressing   Problem: Nutrition: Goal: Adequate nutrition will be maintained Outcome: Progressing   Problem: Coping: Goal: Level of anxiety will decrease Outcome: Progressing   Problem: Elimination: Goal: Will not experience complications related to bowel motility Outcome: Progressing Goal: Will not experience complications related to urinary retention Outcome: Progressing   Problem: Pain Managment: Goal: General experience of comfort will improve and/or be controlled Outcome: Progressing   Problem: Safety: Goal: Ability to remain free from injury will improve Outcome: Progressing   Problem: Skin Integrity: Goal: Risk for impaired skin integrity will decrease Outcome: Progressing   Problem: Education: Goal: Understanding of cardiac disease, CV risk reduction, and recovery process will improve Outcome: Progressing Goal: Individualized Educational Video(s) Outcome: Progressing   Problem: Activity: Goal: Ability to tolerate increased activity will improve Outcome: Progressing   Problem: Cardiac: Goal: Ability to achieve and maintain adequate cardiovascular  perfusion will improve Outcome: Progressing   Problem: Health Behavior/Discharge Planning: Goal: Ability to safely manage health-related needs after discharge will improve Outcome: Progressing   Problem: Education: Goal: Understanding of CV disease, CV risk reduction, and recovery process will improve Outcome: Progressing Goal: Individualized Educational Video(s) Outcome: Progressing   Problem: Activity: Goal: Ability to return to baseline activity level will improve Outcome: Progressing   Problem: Cardiovascular: Goal: Ability to achieve and maintain adequate cardiovascular perfusion will improve Outcome: Progressing Goal: Vascular access site(s) Level 0-1 will be maintained Outcome: Progressing   Problem: Health Behavior/Discharge Planning: Goal: Ability to safely manage health-related needs after discharge will improve Outcome: Progressing   Problem: Education: Goal: Ability to describe self-care measures that may prevent or decrease complications (Diabetes Survival Skills Education) will improve Outcome: Progressing Goal: Individualized Educational Video(s) Outcome: Progressing   Problem: Coping: Goal: Ability to adjust to condition or change in health will improve Outcome: Progressing   Problem: Fluid Volume: Goal: Ability to maintain a balanced intake and output will improve Outcome: Progressing   Problem: Health Behavior/Discharge Planning: Goal: Ability to identify and utilize available resources and services will improve Outcome: Progressing Goal: Ability to manage health-related needs will improve Outcome: Progressing   Problem: Metabolic: Goal: Ability to maintain appropriate glucose levels will improve Outcome: Progressing   Problem: Nutritional: Goal: Maintenance of adequate nutrition will improve Outcome: Progressing Goal: Progress toward achieving an optimal weight will improve Outcome: Progressing   Problem: Skin Integrity: Goal: Risk for  impaired skin integrity will decrease Outcome: Progressing   Problem: Tissue Perfusion: Goal: Adequacy of tissue perfusion will improve Outcome: Progressing

## 2024-02-22 NOTE — Discharge Instructions (Signed)

## 2024-02-23 DIAGNOSIS — I2102 ST elevation (STEMI) myocardial infarction involving left anterior descending coronary artery: Secondary | ICD-10-CM | POA: Diagnosis not present

## 2024-02-23 LAB — CBC
HCT: 29.7 % — ABNORMAL LOW (ref 39.0–52.0)
Hemoglobin: 8.6 g/dL — ABNORMAL LOW (ref 13.0–17.0)
MCH: 24 pg — ABNORMAL LOW (ref 26.0–34.0)
MCHC: 29 g/dL — ABNORMAL LOW (ref 30.0–36.0)
MCV: 83 fL (ref 80.0–100.0)
Platelets: 339 10*3/uL (ref 150–400)
RBC: 3.58 MIL/uL — ABNORMAL LOW (ref 4.22–5.81)
RDW: 18.2 % — ABNORMAL HIGH (ref 11.5–15.5)
WBC: 11.6 10*3/uL — ABNORMAL HIGH (ref 4.0–10.5)
nRBC: 0 % (ref 0.0–0.2)

## 2024-02-23 LAB — DIGOXIN LEVEL: Digoxin Level: 0.6 ng/mL — ABNORMAL LOW (ref 0.8–2.0)

## 2024-02-23 LAB — BASIC METABOLIC PANEL WITH GFR
Anion gap: 11 (ref 5–15)
BUN: 34 mg/dL — ABNORMAL HIGH (ref 8–23)
CO2: 20 mmol/L — ABNORMAL LOW (ref 22–32)
Calcium: 8.2 mg/dL — ABNORMAL LOW (ref 8.9–10.3)
Chloride: 103 mmol/L (ref 98–111)
Creatinine, Ser: 1.88 mg/dL — ABNORMAL HIGH (ref 0.61–1.24)
GFR, Estimated: 37 mL/min — ABNORMAL LOW (ref >60)
Glucose, Bld: 172 mg/dL — ABNORMAL HIGH (ref 70–99)
Potassium: 3.8 mmol/L (ref 3.5–5.1)
Sodium: 134 mmol/L — ABNORMAL LOW (ref 135–145)

## 2024-02-23 LAB — COOXEMETRY PANEL
Carboxyhemoglobin: 1.2 % (ref 0.5–1.5)
Carboxyhemoglobin: 1.5 % (ref 0.5–1.5)
Methemoglobin: <0.7 % (ref 0.0–1.5)
Methemoglobin: <0.7 % (ref 0.0–1.5)
O2 Saturation: 41 %
O2 Saturation: 57.9 %
Total hemoglobin: 9 g/dL — ABNORMAL LOW (ref 12.0–16.0)
Total hemoglobin: 9.5 g/dL — ABNORMAL LOW (ref 12.0–16.0)

## 2024-02-23 LAB — GLUCOSE, CAPILLARY
Glucose-Capillary: 149 mg/dL — ABNORMAL HIGH (ref 70–99)
Glucose-Capillary: 192 mg/dL — ABNORMAL HIGH (ref 70–99)
Glucose-Capillary: 140 mg/dL — ABNORMAL HIGH (ref 70–99)

## 2024-02-23 MED ORDER — AMIODARONE HCL 200 MG PO TABS
200.0000 mg | ORAL_TABLET | Freq: Two times a day (BID) | ORAL | Status: DC
  Administered 2024-02-23 – 2024-02-27 (×10): 200 mg via ORAL
  Filled 2024-02-23 (×10): qty 1

## 2024-02-23 MED ORDER — PREDNISONE 20 MG PO TABS
40.0000 mg | ORAL_TABLET | Freq: Every day | ORAL | Status: AC
  Administered 2024-02-24 – 2024-02-26 (×3): 40 mg via ORAL
  Filled 2024-02-23 (×3): qty 2

## 2024-02-23 MED ORDER — LEVOTHYROXINE SODIUM 75 MCG PO TABS
150.0000 ug | ORAL_TABLET | ORAL | Status: DC
  Administered 2024-02-24 – 2024-02-27 (×2): 150 ug via ORAL
  Filled 2024-02-23 (×2): qty 2

## 2024-02-23 MED ORDER — SODIUM CHLORIDE 0.9 % IV SOLN
500.0000 mg | Freq: Once | INTRAVENOUS | Status: AC
  Administered 2024-02-23: 500 mg via INTRAVENOUS
  Filled 2024-02-23: qty 25

## 2024-02-23 MED ORDER — THIAMINE MONONITRATE 100 MG PO TABS
100.0000 mg | ORAL_TABLET | Freq: Every day | ORAL | Status: DC
  Administered 2024-02-23 – 2024-02-29 (×7): 100 mg via ORAL
  Filled 2024-02-23 (×7): qty 1

## 2024-02-23 MED ORDER — FOLIC ACID 1 MG PO TABS
1.0000 mg | ORAL_TABLET | Freq: Every day | ORAL | Status: DC
  Administered 2024-02-23 – 2024-02-29 (×7): 1 mg via ORAL
  Filled 2024-02-23 (×7): qty 1

## 2024-02-23 MED ORDER — TORSEMIDE 20 MG PO TABS
20.0000 mg | ORAL_TABLET | Freq: Every day | ORAL | Status: DC
  Administered 2024-02-24 – 2024-02-25 (×2): 20 mg via ORAL
  Filled 2024-02-23 (×2): qty 1

## 2024-02-23 MED ORDER — LEVOTHYROXINE SODIUM 75 MCG PO TABS
150.0000 ug | ORAL_TABLET | Freq: Every day | ORAL | Status: DC
  Administered 2024-02-24 – 2024-02-29 (×6): 150 ug via ORAL
  Filled 2024-02-23 (×7): qty 2

## 2024-02-23 MED ORDER — ADULT MULTIVITAMIN W/MINERALS CH
1.0000 | ORAL_TABLET | Freq: Every day | ORAL | Status: DC
  Administered 2024-02-23 – 2024-02-29 (×7): 1 via ORAL
  Filled 2024-02-23 (×7): qty 1

## 2024-02-23 NOTE — Discharge Summary (Addendum)
 Advanced Heart Failure Team  Discharge Summary   Patient ID: Mark Meadows MRN: [id number], DOB/AGE: [birth year] 73 y.o. Admit date: 02/15/2024 D/C date:     02/29/2024   Primary Discharge Diagnoses:  Late presenting anterior STEMI/CAD Cardiogenic shock Acute HFrEF  Secondary Discharge Diagnoses:  Aortic valve stenosis LV thrombus CKD IIIa Hypothyroidism Iron  deficiency anemia Gout flare  Hospital Course:   73 y.o. male with history of alcoholism, prior tobacco use, DM, HTN, CKD IIIa, iron  deficiency anemia, aortic valve stenosis (moderate on echo at [facility] 06/23), prior atrial fibrillation/flutter in setting of ETOH withdrawal (2019).  Admitted 02/15/24 with late presenting anterolateral STEMI s/p PCi/DES to LAD. Echo with EF 15-20%, LV thrombus, RV okay and low flow low gradient severe AS. Course c/b cardiogenic shock requiring inotrope support with milrinone . He was diuresed. GDMT limited by blood pressure and renal impairment. He later went into atrial fibrillation/flutter with controlled rate. Started on PO amiodarone . Decided against cardioversion until valvular heart disease treated. After discussion with structural heart team he was set up for outpatient visit to discuss TAVR. Not currently a candidate d/t LV thrombus (confirmed by cardiac MRI).  Received 1 u RBCs and IV iron  for iron  deficiency anemia. Also treated with colchicine  and prednisone  for gout flare.  He was seen by PT/OT and set up for [facility] PT. He later declined services.   Close follow-up with HF clinic and PCP arranged. He will f/u with structural heart clinic pending multidisciplinary valve team meeting on 03/01/24.  See below for hospital course by problem.  Hospital Course by Problem: 1.  Anterolateral STEMI/CAD             - Cath 02/15/2024 showed 100% prox to mid LAD lesion treated with Synergy DES, EF 25-35%.              - cardiac MRI 02/25/2024: LVEF 32%, RVEF 44%, mild AI, severe AS, moderate MR, LAD territory  infarction unlikely to be viable, suspect very small 5mm LV apical thrombus.              - ASA stopped given apixaban  use, continue plavix .              - Continue Crestor  40 mg daily.  2.  Acute systolic heart failure/ ischemic cardiomyopathy             - EF 15-20% on echo. RHC showed CI 2.4.  EF 32% on cMRI with dense LAD territory infarction, suspect nonviable.              - euvolemic on exam today.              - continue digoxin  and spironolactone . No BB given severe LV dysfunction.              - Start Farxiga 10 mg daily.  - Can use Lasix  prn at home.  3.  Severe aortic stenosis             - seen by Dr. Abel Hoe for consideration of TAVR "We have reviewed his options for TAVR. He sizes to a 29 mm Sapien valve and has femoral access. Unfortunately he has residual small LV thrombus by MRI today. TAVR would put him at high risk for a cardioembolic event. He understands this. Will delay TAVR for now. Continue to optimize medically." Given LV thrombus, will delay TAVR for now.               - F/u to be  determined per case review in valve team meeting 03/01/24. 4.  LV thrombus             - suspected on echocardiogram 5/27. Cardiac MRI 02/26/2024 showed very small 5mm LV apical thrombus             - Now on Eliquis .               - Thrombus will delay TAVR, structural heart to reach out to him for follow-up.  5. CKD stage 3: Creatinine stable at 1.9.             - Adding Farxiga today.  6.  Iron  deficiency anemia: no evidence of bleed. Given IV iron   - Hgb improved to 9.6 7.  Gout: colchicine    Completed prednisone  burst 8.  Atrial fibrillation/flutter:              - Remains in atrial flutter rate around 100.              - Resume amiodarone  200 mg daily, will aim to cardiovert him after TAVR.              - Continue apixaban .  9. DM II   - A1c 6.4%  - Farixga started this admit  - Follow-up with PCP  Discharge Vitals: Blood pressure 109/61, pulse 74, temperature 98.4 F (36.9 C),  temperature source Oral, resp. rate 20, height 5\' 8"  (1.727 m), weight 95 kg, SpO2 96%.  Labs: Lab Results  Component Value Date   WBC 16.1 (H) 02/29/2024   HGB 9.6 (L) 02/29/2024   HCT 33.4 (L) 02/29/2024   MCV 86.1 02/29/2024   PLT 345 02/29/2024    Recent Labs  Lab 02/29/24 0409  NA 137  K 4.6  CL 106  CO2 23  BUN 38*  CREATININE 1.90*  CALCIUM  9.1  GLUCOSE 180*   Lab Results  Component Value Date   CHOL 86 02/16/2024   HDL 18 (L) 02/16/2024   LDLCALC 55 02/16/2024   TRIG 67 02/16/2024   BNP (last 3 results) No results for input(s): "BNP" in the last 8760 hours.  ProBNP (last 3 results) No results for input(s): "PROBNP" in the last 8760 hours.   Diagnostic Studies/Procedures   Cardiac MRI March 25, 2024:  IMPRESSION: 1.  Airspace disease in lower lung fields as above. 2. Normal LV size with mild LV hypertrophy. Wall motion abnormalities as noted above in LAD infarction pattern. LV EF 32%. 3.  Normal RV size with RV EF 44%. 4. Thickened/calcified aortic valve, visually severe stenosis. Mild regurgitation with regurgitant fraction 19%.  5.  Moderate mitral regurgitation with regurgitant fraction 30%.  6. Delayed enhancement pattern suggests dense LAD territory myocardial infarction. The infarcted segments are unlikely to be viable with revascularization.  7.  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  8. Mildly elevated extracellular volume percentage at 32% likely related to scar from anterior infarction.  RHC 02/22/24: Conclusion 1. Elevated right and left heart filling pressures.  2. Preserved CO by Fick though low by thermodilution.  3. Moderate pulmonary venous hypertension.  4. Adequate PAPi   Continue to diurese, will leave off milrinone  for now.   Echo 02/16/2024: IMPRESSIONS   1. Akinesis of the mid septum into apex and inferior septum consistent with LAD infarction. LVEF 15-20%. Cannot exclude a layered LV thrombus on contrast imaging. Consider  cardiac MRI for definitive evaluation. LVOT VTI 9 cm. CO estimated 1.8 L/min and CI 0.8 L/min/m2,  concerning for low output state. Left ventricular  ejection fraction, by estimation, is 15-20%. The left ventricle has severely decreased function. The left ventricle demonstrates regional wall motion abnormalities (see scoring diagram/findings for description). There is moderate concentric left ventricular hypertrophy. Indeterminate diastolic filling due to E-A fusion.   2. Severely calcified aortic valve with restricted leaflet movement. Vmax 2.6 m/s, MG 16 mmHG, AVA 0.81 cm2, DI 0.24. This is concerning for low flow low gradient severe aortic stenosis. Recommend clinical correlation. The aortic valve is calcified.  Aortic valve regurgitation is trivial. Moderate to severe aortic valve stenosis.   3. Right ventricular systolic function is normal. The right ventricular size is mildly enlarged. Tricuspid regurgitation signal is inadequate for assessing PA pressure.   4. The mitral valve is degenerative. Mild mitral valve regurgitation. No evidence of mitral stenosis.   5. The inferior vena cava is dilated in size with >50% respiratory variability, suggesting right atrial pressure of 8 mmHg.    Discharge Medications   Allergies as of 02/29/2024   No Known Allergies      Medication List     STOP taking these medications    acetaminophen  500 MG tablet Commonly known as: TYLENOL    B-12 PO   ibuprofen 200 MG tablet Commonly known as: ADVIL   IRON  PO   omeprazole 20 MG capsule Commonly known as: PRILOSEC Replaced by: pantoprazole  40 MG tablet       TAKE these medications    amiodarone  200 MG tablet Commonly known as: PACERONE  Take 1 tablet (200 mg total) by mouth daily. Start taking on: March 01, 2024   apixaban  5 MG Tabs tablet Commonly known as: ELIQUIS  Take 1 tablet (5 mg total) by mouth 2 (two) times daily.   clopidogrel  75 MG tablet Commonly known as: PLAVIX  Take 1 tablet  (75 mg total) by mouth daily with breakfast. Start taking on: March 01, 2024   D3 5000 PO Take 5,000 Units by mouth daily.   dapagliflozin propanediol 10 MG Tabs tablet Commonly known as: FARXIGA Take 1 tablet (10 mg total) by mouth daily. Start taking on: March 01, 2024   digoxin  0.125 MG tablet Commonly known as: LANOXIN  Take 1 tablet (0.125 mg total) by mouth daily. Start taking on: March 01, 2024   furosemide  40 MG tablet Commonly known as: Lasix  Take 1 tablet as needed for weight gain, lower extremity edema, increased shortness of breath   levothyroxine  150 MCG tablet Commonly known as: SYNTHROID  Take 150-300 mcg by mouth See admin instructions. Take 150mcg (1 tablet) by mouth 5 days a week, then take 300mcg (2 tablets) 2 days a week.   MAGNESIUM  PO Take 1 tablet by mouth daily.   pantoprazole  40 MG tablet Commonly known as: PROTONIX  Take 1 tablet (40 mg total) by mouth daily. Start taking on: March 01, 2024 Replaces: omeprazole 20 MG capsule   rosuvastatin  40 MG tablet Commonly known as: CRESTOR  Take 1 tablet (40 mg total) by mouth daily. Start taking on: March 01, 2024   spironolactone  25 MG tablet Commonly known as: ALDACTONE  Take 0.5 tablets (12.5 mg total) by mouth daily. Start taking on: March 01, 2024               Durable Medical Equipment  (From admission, onward)           Start     Ordered   02/28/24 1145  For home use only DME 4 wheeled rolling walker with seat  Once  Question Answer Comment  Patient needs a walker to treat with the following condition Heart failure [facility])   Patient needs a walker to treat with the following condition Physical deconditioning      02/28/24 1144   02/23/24 1506  Heart failure home health orders  (Heart failure home health orders / Face to face)  Once       Comments: Heart Failure Follow-up Care:  Verify follow-up appointments per Patient Discharge Instructions. Confirm transportation arranged. Reconcile  home medications with discharge medication list. Remove discontinued medications from use. Assist patient/caregiver to manage medications using pill box. Reinforce low sodium food selection Assessments: Vital signs and oxygen saturation at each visit. Assess home environment for safety concerns, caregiver support and availability of low-sodium foods. Consult Child psychotherapist, PT/OT, Dietitian, and CNA based on assessments. Perform comprehensive cardiopulmonary assessment. Notify MD for any change in condition or weight gain of 3 pounds in one day or 5 pounds in one week with symptoms. Daily Weights and Symptom Monitoring: Ensure patient has access to scales. Teach patient/caregiver to weigh daily before breakfast and after voiding using same scale and record.    Teach patient/caregiver to track weight and symptoms and when to notify Provider. Activity: Develop individualized activity plan with patient/caregiver.   Question Answer Comment  Heart Failure Follow-up Care Advanced Heart Failure (AHF) Clinic at [phone]   Obtain the following labs Basic Metabolic Panel   Lab frequency Other see comments   Fax lab results to AHF Clinic at [phone]   Diet Low Sodium Heart Healthy   Fluid restrictions: 1800 mL Fluid   Skilled Nurse to notify MD of weight trends weekly for first 2 weeks. May fax or call: AHF Clinic at [phone] (fax) or [phone]      02/23/24 1507            Disposition   The patient will be discharged in stable condition to home. Discharge Instructions     Amb Referral to Cardiac Rehabilitation   Complete by: As directed    Diagnosis:  Coronary Stents STEMI     After initial evaluation and assessments completed: Virtual Based Care may be provided alone or in conjunction with Phase 2 Cardiac Rehab based on patient barriers.: Yes   Intensive Cardiac Rehabilitation (ICR) [facility] location only OR Traditional Cardiac Rehabilitation (TCR) *If criteria for ICR are not met  will enroll in TCR [facility] only): Yes   Diet - low sodium heart healthy   Complete by: As directed    Heart Failure patients record your daily weight using the same scale at the same time of day   Complete by: As directed    Increase activity slowly   Complete by: As directed    STOP any activity that causes chest pain, shortness of breath, dizziness, sweating, or exessive weakness   Complete by: As directed        Follow-up Information     [facility] Follow up on 03/10/2024.   Specialty: Cardiology Why: Advanced Heart Failure Clinic 9:30 AM Entrance C, Free Valet Parking Contact information: [address] [place]   [phone]        Lorella Roles, MD. Go to.   Specialty: Family Medicine Why: Hospital follow up appointment scheduled for Monday, March 07, 2024 at 1:00PM. PLEASE ARRIVE 10-15 minutes early.  PLEASE call to cancel/reschedule your appointment if you CANNOT make it. Contact information: [address] [place] [address] [phone]  Signed, FINCH, LINDSAY N  02/29/2024, 12:44 PM   Patient seen with NP, I formulated the plan and agree with the above note.   Please see my earlier note from today describing my discharge plan.   I spent 35 minutes on this discharge.   Peder Bourdon 02/29/2024 4:38 PM

## 2024-02-23 NOTE — Plan of Care (Signed)
  Problem: Education: Goal: Knowledge of General Education information will improve Description: Including pain rating scale, medication(s)/side effects and non-pharmacologic comfort measures Outcome: Progressing   Problem: Health Behavior/Discharge Planning: Goal: Ability to manage health-related needs will improve Outcome: Progressing   Problem: Clinical Measurements: Goal: Ability to maintain clinical measurements within normal limits will improve Outcome: Progressing Goal: Will remain free from infection Outcome: Progressing Goal: Diagnostic test results will improve Outcome: Progressing Goal: Respiratory complications will improve Outcome: Progressing Goal: Cardiovascular complication will be avoided Outcome: Progressing   Problem: Activity: Goal: Risk for activity intolerance will decrease Outcome: Progressing   Problem: Nutrition: Goal: Adequate nutrition will be maintained Outcome: Progressing   Problem: Coping: Goal: Level of anxiety will decrease Outcome: Progressing   Problem: Elimination: Goal: Will not experience complications related to bowel motility Outcome: Progressing Goal: Will not experience complications related to urinary retention Outcome: Progressing   Problem: Pain Managment: Goal: General experience of comfort will improve and/or be controlled Outcome: Progressing   Problem: Safety: Goal: Ability to remain free from injury will improve Outcome: Progressing   Problem: Skin Integrity: Goal: Risk for impaired skin integrity will decrease Outcome: Progressing   Problem: Education: Goal: Understanding of cardiac disease, CV risk reduction, and recovery process will improve Outcome: Progressing Goal: Individualized Educational Video(s) Outcome: Progressing   Problem: Activity: Goal: Ability to tolerate increased activity will improve Outcome: Progressing   Problem: Cardiac: Goal: Ability to achieve and maintain adequate cardiovascular  perfusion will improve Outcome: Progressing   Problem: Health Behavior/Discharge Planning: Goal: Ability to safely manage health-related needs after discharge will improve Outcome: Progressing   Problem: Education: Goal: Understanding of CV disease, CV risk reduction, and recovery process will improve Outcome: Progressing Goal: Individualized Educational Video(s) Outcome: Progressing   Problem: Activity: Goal: Ability to return to baseline activity level will improve Outcome: Progressing   Problem: Cardiovascular: Goal: Ability to achieve and maintain adequate cardiovascular perfusion will improve Outcome: Progressing Goal: Vascular access site(s) Level 0-1 will be maintained Outcome: Progressing   Problem: Health Behavior/Discharge Planning: Goal: Ability to safely manage health-related needs after discharge will improve Outcome: Progressing   Problem: Education: Goal: Ability to describe self-care measures that may prevent or decrease complications (Diabetes Survival Skills Education) will improve Outcome: Progressing Goal: Individualized Educational Video(s) Outcome: Progressing   Problem: Coping: Goal: Ability to adjust to condition or change in health will improve Outcome: Progressing   Problem: Fluid Volume: Goal: Ability to maintain a balanced intake and output will improve Outcome: Progressing   Problem: Health Behavior/Discharge Planning: Goal: Ability to identify and utilize available resources and services will improve Outcome: Progressing Goal: Ability to manage health-related needs will improve Outcome: Progressing   Problem: Metabolic: Goal: Ability to maintain appropriate glucose levels will improve Outcome: Progressing   Problem: Nutritional: Goal: Maintenance of adequate nutrition will improve Outcome: Progressing Goal: Progress toward achieving an optimal weight will improve Outcome: Progressing   Problem: Skin Integrity: Goal: Risk for  impaired skin integrity will decrease Outcome: Progressing   Problem: Tissue Perfusion: Goal: Adequacy of tissue perfusion will improve Outcome: Progressing

## 2024-02-23 NOTE — TOC Progression Note (Addendum)
 Transition of Care [facility]) - Progression Note    Patient Details  Name: Mark Meadows MRN: [id number] Date of Birth: 26-Aug-1951  Transition of Care [facility] CM/SW Contact  Benjiman Bras, RN Phone Number: [phone] 02/23/2024, 3:34 PM  Clinical Narrative:     TOC CM spoke to pt and offered HH, agreeable to [facility] for [facility]. Contacted Cory with new referral. Left message. Pt states he has RW at home. Declined Rollator at this time. States he may want a wheelchair in the near future. States he will need taxi voucher home.   Medicare.gov listing placed on chart with ratings.   Expected Discharge Plan: Home w Home Health Services Barriers to Discharge: Continued Medical Work up  Expected Discharge Plan and Services   Discharge Planning Services: CM Consult Post Acute Care Choice: Home Health Living arrangements for the past 2 months: Single Family Home                           HH Arranged: PT HH Agency: [facility] Date [facility] Agency Contacted: 02/23/24 Time HH Agency Contacted: 1534 Representative spoke with at [facility] Agency: Marcina Severe   Social Determinants of Health (SDOH) Interventions SDOH Screenings   Food Insecurity: No Food Insecurity (02/15/2024)  Housing: Low Risk  (02/15/2024)  Transportation Needs: No Transportation Needs (02/15/2024)  Utilities: Not At Risk (02/15/2024)  Financial Resource Strain: Low Risk  (12/17/2022)   Received from [facility]  Physical Activity: Inactive (02/07/2022)   Received from [facility], [facility]  Social Connections: Moderately Isolated (02/15/2024)  Stress: No Stress Concern Present (02/07/2022)   Received from [facility], [facility]  Tobacco Use: Medium Risk (02/16/2024)    Readmission Risk Interventions     No data to display

## 2024-02-23 NOTE — Progress Notes (Addendum)
 Patient ID: Mark Meadows, male   DOB: 1951/09/07, 73 y.o.   MRN: [id number]     Advanced Heart Failure Rounding Note  Cardiologist: None  Chief Complaint: Cardiogenic Shock   Subjective:   5/26 Late presenting anterolateral STEMI s/p PCI/DES to LAD 5/27 CO-OX 29%. Started on 0.25 milrinone . 5/30 Milrinone  stopped 06/02 RHC - elevated filling pressures, preserved CO by Fick, low by TD, moderate pulmonary venous hypertension  CO-OX 41% with estimated Fick CI 1.9 and Hgb 8.6. Recheck pending.  CVP 4. 4.5L UOP with IV lasix  yesterday. Scr up slightly after diuresis yesterday 1.59>1.88. BUN trending up.   Continues with cough. No shortness of breath. Gout pain LLE slightly better but still bothersome.   Objective:   Weight Range: 96.1 kg Body mass index is 32.21 kg/m.   Vital Signs:   Temp:  [97.1 F (36.2 C)-98.4 F (36.9 C)] 97.1 F (36.2 C) (06/03 0749) Pulse Rate:  [77-107] 82 (06/03 0800) Resp:  [11-36] 34 (06/03 0800) BP: (78-125)/(46-99) 88/59 (06/03 0800) SpO2:  [90 %-100 %] 93 % (06/03 0800) Last BM Date : 02/21/24  Weight change: Filed Weights   02/20/24 0917 02/21/24 0600 02/22/24 0700  Weight: 99.1 kg 98.2 kg 96.1 kg    Intake/Output:   Intake/Output Summary (Last 24 hours) at 02/23/2024 1045 Last data filed at 02/23/2024 0600 Gross per 24 hour  Intake 530.7 ml  Output 2900 ml  Net -2369.3 ml      Physical Exam   General:  Chronically ill appearing. Neck: JVP 7-8 Cor: Irregular rhythm. No rubs, gallops or murmurs. Lungs: clear anteriorly Abdomen: soft, nontender, nondistended.  Extremities: no edema, L ankle tender Neuro: alert & orientedx3. Affect pleasant   Telemetry  Afib 90s-100s  EKG   Meadows/A  Labs    CBC Recent Labs    02/22/24 0510 02/22/24 0930 02/23/24 0500  WBC 11.9*  --  11.6*  HGB 8.0* 9.5*  9.5* 8.6*  HCT 28.3* 28.0*  28.0* 29.7*  MCV 85.0  --  83.0  PLT 311  --  339   Basic Metabolic Panel Recent Labs     02/21/24 0414 02/22/24 0510 02/22/24 0930 02/23/24 0500  NA 133* 132* 137  138 134*  K 3.9 4.0 4.2  4.1 3.8  CL 106 105  --  103  CO2 20* 20*  --  20*  GLUCOSE 175* 177*  --  172*  BUN 27* 26*  --  34*  CREATININE 1.58* 1.59*  --  1.88*  CALCIUM  7.6* 7.6*  --  8.2*  MG 2.3  --   --   --      Other results:   Imaging   - No new imaging   Medications:     Scheduled Medications:  sodium chloride    Intravenous Once   apixaban   5 mg Oral BID   aspirin  EC  81 mg Oral Daily   Chlorhexidine  Gluconate Cloth  6 each Topical Daily   clopidogrel   75 mg Oral Q breakfast   colchicine   0.6 mg Oral Daily   digoxin   0.125 mg Oral Daily   insulin  aspart  0-15 Units Subcutaneous TID WC   insulin  aspart  0-5 Units Subcutaneous QHS   levothyroxine   150 mcg Oral Q0600   pantoprazole   40 mg Oral Daily   polyethylene glycol  17 g Oral Daily   rosuvastatin   40 mg Oral Daily   senna-docusate  1 tablet Oral Daily   sodium chloride  flush  10-40  mL Intracatheter Q12H   sodium chloride  flush  10-40 mL Intracatheter Q12H   sodium chloride  flush  3 mL Intravenous Q12H   spironolactone   12.5 mg Oral Daily    Infusions:    PRN Medications: acetaminophen , diazepam , methocarbamol , morphine  injection, nitroGLYCERIN , mouth rinse, sodium chloride  flush, sodium chloride  flush, sodium chloride  flush    Patient Profile   Mark Meadows is a 73 year old with a history of HTN, HLD, and tobacco abuse. Admitted with late presenting anterior STEMI c/b cardiogenic shock.  Assessment/Plan  1. Anterior STEMI with CAD -HS Trop >24K. Cath with 100% p to m LAD treated with PTCA/DES. 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  -Stopped aspirin . Continue plavix  alone with need for anticoagulation. -Continue crestor  40 mg daily  -No chest pain.    2. Cardiogenic Shock: Acute HFrEF/ischemic cardiomyopathy.  - Echo EF 30% on admit.  - Echo 5/27 EF 15-20%, RV normal with severe AS.  - RHC 02/22/24: Elevated right  and left sided filling pressures, moderate pulmonary venous hypertension, Fick CI 2.4, TD CI 1.97 - CO-OX 58% off milrinone .  - CVP 7. Diuresed well yesterday with IV lasix . Start po Torsemide 20 mg daily tomorrow.  - Continue digoxin  0.125, dig level 0.6 today - Continue spiro 12.5 mg daily   3. Aortic valve stenosis - Echo 05/27: severely calcified aortic valve with restricted leaflets, AVA 0.81 cm2 an, MG 16 and DI 0.24, concerning for low flow low gradient AS. -Discussed with structural team this admission. Plan for outpatient TAVR evaluation. Will reach out to team for follow-up.   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 CKD Stage IIIa -Unclear baseline. Scr 1.6-1.8 this admit. - sCr up slightly after diuresis. Stop IV lasix . Start po Torsemide tomorrow.   6. Hypothyroid -TSH 6.  -On Levothyroxine .   7. 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 Gout flare - Involving left ankle and great toe - Started colchicine  05/30. Some improvement in pain but still bothersome. Start prednisone 40 X 3 days. Stop colchicine .  9. Atrial fibrillation - Noted on telemetry today - Start po amiodarone 200 BID - Continue Eliquis  - Would hold off on DCCV until after TAVR  Mobilize. Have PT reassess today to anticipate discharge needs.  Transfer to floor today.   Length of Stay: 8  FINCH, Mark N, PA-C  02/23/2024, 10:45 AM  Advanced Heart Failure Team Pager [phone] (M-F; 7a - 5p)  Please contact [facility] for night-coverage after hours (5p -7a ) and weekends on [email or web address]  Patient seen with PA, I formulated the plan and agree with the above note.   Patient is in atrial fibrillation this morning with controlled rate.  This appears now.   Co-ox 58% today off  milrinone .  CVP 7 on my read, excellent diuresis yesterday (net negative 3833 cc).  Creatinine 1.59 => 1.88.   General: NAD Neck: No JVD, no thyromegaly or thyroid nodule.  Lungs: Clear to auscultation bilaterally with normal respiratory effort. CV: Nondisplaced PMI.  Heart irregular S1/S2, no S3/S4, no murmur.  No peripheral edema.    Abdomen: Soft, nontender, no hepatosplenomegaly, no distention.  Skin: Intact without lesions or rashes.  Neurologic: Alert and oriented x 3.  Psych: Normal affect. Extremities: No clubbing or cyanosis.  HEENT: Normal.   Patient is in rate-controlled atrial fibrillation this morning, this appears new.  - He is already on Eliquis  for LV thrombus.  - Will start amiodarone 200 mg bid, aim for DCCV after future TAVR if he stays in AF.   Co-ox is adequate today with CVP 7.  - He will stay off milrinone  - continue digoxin  (level ok) and spironolactone  12.5 daily.   - Start torsemide 20 mg daily tomorrow.   Patient needs TAVR, plan for home as long as he remains stable off inotrope, then followup with structural heart clinic to set this up.   Will give IV Fe for Fe deficiency anemia.   Work with PT to make sure he is ok going home and not to SNF.   He can go to the floor today.   Mark Meadows 02/23/2024 11:58 AM

## 2024-02-23 NOTE — Progress Notes (Signed)
 Physical Therapy Treatment Patient Details Name: Mark Meadows MRN: [id number] DOB: 1951-08-11 Today's Date: 02/23/2024   History of Present Illness 73 y.o. male presents to [facility] 02/15/24 as transfer from [facility] ED for evaluation of STEMI, stent placed. Complicated by cardiogenic shock. PMHx: HTN, gout, thrombocytopenia, anemia, dysrhythmia, CKD stage 3a.  Heart Cath procedure 02/22/24.    PT Comments  Patient continues with bil LE pain (which he reports is nearly constant even PTA). He was however able to walk 160 ft with RW and CGA with cues for safest use of RW (during transfer and proximity when walking). Patient with significant improvement and discharge recommendation updated to HHPT.     If plan is discharge home, recommend the following: Assistance with cooking/housework;Assist for transportation;Help with stairs or ramp for entrance;A little help with walking and/or transfers;A little help with bathing/dressing/bathroom   Can travel by private vehicle     Yes  Equipment Recommendations  Other (comment) (pt requested/MD ordered rollator)    Recommendations for Other Services       Precautions / Restrictions Precautions Precautions: Fall Precaution/Restrictions Comments: watch O2 Restrictions Weight Bearing Restrictions Per Provider Order: No     Mobility  Bed Mobility Overal bed mobility: Needs Assistance Bed Mobility: Supine to Sit, Sit to Supine     Supine to sit: Supervision Sit to supine: Supervision        Transfers Overall transfer level: Needs assistance Equipment used: Rolling walker (2 wheels) Transfers: Sit to/from Stand Sit to Stand: Contact guard assist           General transfer comment: vc for safest hand placement yet pt chose to stand from EOB with both hands on RW; no tipping of RW noted    Ambulation/Gait Ambulation/Gait assistance: Supervision Gait Distance (Feet): 160 Feet Assistive device: Rolling walker (2 wheels) Gait Pattern/deviations:  Step-through pattern, Decreased stance time - right, Decreased stride length, Decreased weight shift to right, Antalgic   Gait velocity interpretation: 1.31 - 2.62 ft/sec, indicative of limited community ambulator   General Gait Details: continues with pain in LEs, but improved and able to ambulate; refused sit up in chair   Stairs             Wheelchair Mobility     Tilt Bed    Modified Rankin (Stroke Patients Only)       Balance Overall balance assessment: Needs assistance Sitting-balance support: No upper extremity supported, Feet supported Sitting balance-Leahy Scale: Good     Standing balance support: Reliant on assistive device for balance Standing balance-Leahy Scale: Poor Standing balance comment: reliant on RW and UE support                            Communication Communication Communication: No apparent difficulties  Cognition Arousal: Alert Behavior During Therapy: WFL for tasks assessed/performed   PT - Cognitive impairments: No apparent impairments                         Following commands: Intact      Cueing Cueing Techniques: Verbal cues  Exercises      General Comments General comments (skin integrity, edema, etc.): HR 96-107; sats 96% on RA with mild dyspnea as pt talked continuously while walking      Pertinent Vitals/Pain Pain Assessment Pain Assessment: Faces Faces Pain Scale: Hurts little more Pain Location: L ankle and Rt knee Pain Descriptors / Indicators: Grimacing, Guarding, [place]  Pain Intervention(s): Limited activity within patient's tolerance, Monitored during session    Home Living                          Prior Function            PT Goals (current goals can now be found in the care plan section) Acute Rehab PT Goals Patient Stated Goal: to have less pain when moving PT Goal Formulation: With patient Time For Goal Achievement: 03/02/24 Potential to Achieve Goals: Good Progress  towards PT goals: Progressing toward goals    Frequency    Min 2X/week      PT Plan      Co-evaluation              AM-PAC PT "6 Clicks" Mobility   Outcome Measure  Help needed turning from your back to your side while in a flat bed without using bedrails?: A Little Help needed moving from lying on your back to sitting on the side of a flat bed without using bedrails?: A Little Help needed moving to and from a bed to a chair (including a wheelchair)?: A Little Help needed standing up from a chair using your arms (e.g., wheelchair or bedside chair)?: A Little Help needed to walk in hospital room?: A Little Help needed climbing 3-5 steps with a railing? : A Little 6 Click Score: 18    End of Session Equipment Utilized During Treatment: Gait belt Activity Tolerance: No increased pain Patient left: in bed;with call bell/phone within reach Nurse Communication: Mobility status;Other (comment) (agree with OT; recommend HH on discharge) PT Visit Diagnosis: Other abnormalities of gait and mobility (R26.89);Unsteadiness on feet (R26.81)     Time: 1610-9604 PT Time Calculation (min) (ACUTE ONLY): 23 min  Charges:    $Gait Training: 23-37 mins PT General Charges $$ ACUTE PT VISIT: 1 Visit                      Gayle Kava, PT Acute Rehabilitation Services  Office [phone]    Guilford Leep 02/23/2024, 12:13 PM

## 2024-02-24 ENCOUNTER — Inpatient Hospital Stay (HOSPITAL_COMMUNITY)

## 2024-02-24 DIAGNOSIS — I350 Nonrheumatic aortic (valve) stenosis: Secondary | ICD-10-CM

## 2024-02-24 LAB — GLUCOSE, CAPILLARY
Glucose-Capillary: 183 mg/dL — ABNORMAL HIGH (ref 70–99)
Glucose-Capillary: 250 mg/dL — ABNORMAL HIGH (ref 70–99)
Glucose-Capillary: 400 mg/dL — ABNORMAL HIGH (ref 70–99)

## 2024-02-24 LAB — BASIC METABOLIC PANEL WITH GFR
Anion gap: 9 (ref 5–15)
BUN: 37 mg/dL — ABNORMAL HIGH (ref 8–23)
CO2: 21 mmol/L — ABNORMAL LOW (ref 22–32)
Calcium: 8.7 mg/dL — ABNORMAL LOW (ref 8.9–10.3)
Chloride: 105 mmol/L (ref 98–111)
Creatinine, Ser: 1.47 mg/dL — ABNORMAL HIGH (ref 0.61–1.24)
GFR, Estimated: 50 mL/min — ABNORMAL LOW (ref >60)
Glucose, Bld: 169 mg/dL — ABNORMAL HIGH (ref 70–99)
Potassium: 4.1 mmol/L (ref 3.5–5.1)
Sodium: 135 mmol/L (ref 135–145)

## 2024-02-24 LAB — CBC
HCT: 28 % — ABNORMAL LOW (ref 39.0–52.0)
Hemoglobin: 8.4 g/dL — ABNORMAL LOW (ref 13.0–17.0)
MCH: 24.2 pg — ABNORMAL LOW (ref 26.0–34.0)
MCHC: 30 g/dL (ref 30.0–36.0)
MCV: 80.7 fL (ref 80.0–100.0)
Platelets: 312 10*3/uL (ref 150–400)
RBC: 3.47 MIL/uL — ABNORMAL LOW (ref 4.22–5.81)
RDW: 18.1 % — ABNORMAL HIGH (ref 11.5–15.5)
WBC: 10.5 10*3/uL (ref 4.0–10.5)
nRBC: 0 % (ref 0.0–0.2)

## 2024-02-24 LAB — COOXEMETRY PANEL
Carboxyhemoglobin: 1.2 % (ref 0.5–1.5)
Carboxyhemoglobin: 1.2 % (ref 0.5–1.5)
Methemoglobin: <0.7 % (ref 0.0–1.5)
Methemoglobin: <0.7 % (ref 0.0–1.5)
O2 Saturation: 44.3 %
O2 Saturation: 39.1 %
Total hemoglobin: 9 g/dL — ABNORMAL LOW (ref 12.0–16.0)
Total hemoglobin: 8.9 g/dL — ABNORMAL LOW (ref 12.0–16.0)

## 2024-02-24 MED ORDER — AMIODARONE IV BOLUS ONLY 150 MG/100ML
150.0000 mg | Freq: Once | INTRAVENOUS | Status: AC
  Administered 2024-02-24: 150 mg via INTRAVENOUS
  Filled 2024-02-24: qty 100

## 2024-02-24 NOTE — Progress Notes (Signed)
 OT Cancellation Note  Patient Details Name: Mark Meadows MRN: [id number] DOB: [birth year]   Cancelled Treatment:    Reason Eval/Treat Not Completed: Patient declined, no reason specified pt declined OT session reporting feeling like he already knows "how to do that" when describing ADLs and functional mobility despite education on maintaining strength and endurance through ADL participation. Will continue to follow for OT session.   Curry Dove., COTA/L Acute Rehabilitation Services [phone]   [name] 02/24/2024, 10:04 AM

## 2024-02-24 NOTE — Plan of Care (Signed)
  Problem: Education: Goal: Knowledge of General Education information will improve Description: Including pain rating scale, medication(s)/side effects and non-pharmacologic comfort measures Outcome: Progressing   Problem: Health Behavior/Discharge Planning: Goal: Ability to manage health-related needs will improve Outcome: Progressing   Problem: Clinical Measurements: Goal: Ability to maintain clinical measurements within normal limits will improve Outcome: Progressing Goal: Will remain free from infection Outcome: Progressing Goal: Diagnostic test results will improve Outcome: Progressing Goal: Respiratory complications will improve Outcome: Progressing Goal: Cardiovascular complication will be avoided Outcome: Progressing   Problem: Activity: Goal: Risk for activity intolerance will decrease Outcome: Progressing   Problem: Nutrition: Goal: Adequate nutrition will be maintained Outcome: Progressing   Problem: Coping: Goal: Level of anxiety will decrease Outcome: Progressing   Problem: Elimination: Goal: Will not experience complications related to bowel motility Outcome: Progressing Goal: Will not experience complications related to urinary retention Outcome: Progressing   Problem: Pain Managment: Goal: General experience of comfort will improve and/or be controlled Outcome: Progressing   Problem: Safety: Goal: Ability to remain free from injury will improve Outcome: Progressing   Problem: Skin Integrity: Goal: Risk for impaired skin integrity will decrease Outcome: Progressing   Problem: Education: Goal: Understanding of cardiac disease, CV risk reduction, and recovery process will improve Outcome: Progressing Goal: Individualized Educational Video(s) Outcome: Progressing   Problem: Activity: Goal: Ability to tolerate increased activity will improve Outcome: Progressing   Problem: Cardiac: Goal: Ability to achieve and maintain adequate cardiovascular  perfusion will improve Outcome: Progressing   Problem: Health Behavior/Discharge Planning: Goal: Ability to safely manage health-related needs after discharge will improve Outcome: Progressing   Problem: Education: Goal: Understanding of CV disease, CV risk reduction, and recovery process will improve Outcome: Progressing Goal: Individualized Educational Video(s) Outcome: Progressing   Problem: Activity: Goal: Ability to return to baseline activity level will improve Outcome: Progressing   Problem: Cardiovascular: Goal: Ability to achieve and maintain adequate cardiovascular perfusion will improve Outcome: Progressing Goal: Vascular access site(s) Level 0-1 will be maintained Outcome: Progressing   Problem: Health Behavior/Discharge Planning: Goal: Ability to safely manage health-related needs after discharge will improve Outcome: Progressing   Problem: Education: Goal: Ability to describe self-care measures that may prevent or decrease complications (Diabetes Survival Skills Education) will improve Outcome: Progressing Goal: Individualized Educational Video(s) Outcome: Progressing   Problem: Coping: Goal: Ability to adjust to condition or change in health will improve Outcome: Progressing   Problem: Fluid Volume: Goal: Ability to maintain a balanced intake and output will improve Outcome: Progressing   Problem: Health Behavior/Discharge Planning: Goal: Ability to identify and utilize available resources and services will improve Outcome: Progressing Goal: Ability to manage health-related needs will improve Outcome: Progressing   Problem: Metabolic: Goal: Ability to maintain appropriate glucose levels will improve Outcome: Progressing   Problem: Nutritional: Goal: Maintenance of adequate nutrition will improve Outcome: Progressing Goal: Progress toward achieving an optimal weight will improve Outcome: Progressing   Problem: Skin Integrity: Goal: Risk for  impaired skin integrity will decrease Outcome: Progressing   Problem: Tissue Perfusion: Goal: Adequacy of tissue perfusion will improve Outcome: Progressing

## 2024-02-24 NOTE — Consult Note (Cosign Needed Addendum)
 HEART [facility] VASCULAR CENTER   MULTIDISCIPLINARY HEART VALVE TEAM  Cardiology Consultation:   Patient ID: Mark Meadows MRN: [id number]; DOB: May 02, 1951  Admit date: 02/15/2024 Date of Consult: 02/24/2024  Primary Care Provider: Lorella Roles, MD [facility] Cardiologist: Peder Bourdon, MD  [facility] Electrophysiologist:  None    Patient Profile:   Byran Meadows is a 73 y.o. male with a hx of HTN, HLD, CKD stage IIIb, anemia, obesity, DMT2, PAF, hypothyroidism, aortic stenosis, recent STEMI with acute ischemic CM, acute systolic CHF, possible LV thrombus and cardiogenic shock who is being seen today for the evaluation of severe LFLG AS at the request of Dr. Bruce Caper.  History of Present Illness:   Echo 03/13/22 at [facility] showed EF 55-60% and mod AS with mean gradient of 23 mmhg and AVA 1.46 cm2.   He was admitted on 02/15/24 with a late presenting anterolateral STEMI with 100% p to m LAD treated with PTCA/DES. 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 HS Trop >24K. Echo with EF 15-20%, probable LV thrombus, normal RV and low flow low gradient severe AS; AVA 0.81 cm2 an, MG 16 mmhg, DI 0.24, SVI 17. Hospital course c/b cardiogenic shock requiring inotrope support with milrinone . He was diuresed. GDMT limited by blood pressure and renal impairment. He went into atrial fibrillation with controlled rate on 02/23/24. Started on PO amiodarone. Decided against cardioversion until valvular heart disease treated. He received 1U RBCs and IV iron  for iron  deficiency anemia. Also treated with colchicine  and prednisone for gout flare.   Milrinone  stopped on 02/19/24. RHC 02/22/24 showed elevated right and left sided filling pressures, moderate pulmonary venous hypertension, Fick CI 2.4, TD CI 1.97. CO-OX 44%, repeat today 39% -estimated Fick CI 1.7 CO 5.5. Given large anterior infarct it is felt that he will unlikely recover LV function. Transitioned from heparin  to Eliquis . Per Dr. Sabharwal, pt may benefit  from TAVR and restoring sinus rhythm. If these do not help, LVAD is a potential future option.   Structural heart is consulted for consideration of TAVR. He is alone in bed. Feeling better after IV diuresis. No more chest pain. Still has a lot of fatigue and some shortness of breath. He chronically sleeps in a recliner. He lives with a woman and her two daughters but they are going to be moving out soon. Together they foster over 20 cats and dogs. He has been widowed since 2007. Since that time he has not been very active. He blames this on his ongoing issues with gout. He had a very bad case in his right wrist when he started having symptoms of his myocardial infarction, which is partly what delayed him seeking care. He drives and takes care of all of his own ADLs. Most activity he gets is going to the grocery store. Of note, he has poor dentition and has not had the money to get dental extractions.    Past Medical History:  Diagnosis Date   Acute blood loss anemia    Alcohol dependence (HCC)    Alcoholic hepatitis    Dysrhythmia    a. 08/2016 -?afib, not clear. b. 09/2017 - official dx atrial flutter.   E. coli UTI 09/22/2016   GERD (gastroesophageal reflux disease)    Gout    Hematochezia    Hyperglycemia    Hypertension    Hypokalemia    Hypothyroidism    Thrombocytopenia (HCC)     Past Surgical History:  Procedure Laterality Date   COLONOSCOPY N/A 08/26/2016   Procedure: COLONOSCOPY;  Surgeon: Asencion Blacksmith, MD;  Location: [facility] ENDOSCOPY;  Service: Endoscopy;  Laterality: N/A;   CORONARY/GRAFT ACUTE MI REVASCULARIZATION N/A 02/15/2024   Procedure: Coronary/Graft Acute MI Revascularization;  Surgeon: Arnoldo Lapping, MD;  Location: [facility] INVASIVE CV LAB;  Service: Cardiovascular;  Laterality: N/A;   LEFT HEART CATH AND CORONARY ANGIOGRAPHY N/A 02/15/2024   Procedure: LEFT HEART CATH AND CORONARY ANGIOGRAPHY;  Surgeon: Arnoldo Lapping, MD;  Location: [facility] INVASIVE CV LAB;  Service:  Cardiovascular;  Laterality: N/A;   RIGHT HEART CATH N/A 02/22/2024   Procedure: RIGHT HEART CATH;  Surgeon: Darlis Eisenmenger, MD;  Location: [facility] INVASIVE CV LAB;  Service: Cardiovascular;  Laterality: N/A;   TONSILLECTOMY  1957     Home Medications:  Prior to Admission medications   Medication Sig Start Date End Date Taking? Authorizing Provider  acetaminophen  (TYLENOL ) 500 MG tablet Take 500-1,000 mg by mouth every 6 (six) hours as needed for mild pain (pain score 1-3).   Yes [provider]  Cholecalciferol (D3 5000 PO) Take 5,000 Units by mouth daily.   Yes [provider]  Cyanocobalamin  (B-12 PO) Take 1 tablet by mouth daily.   Yes [provider]  Ferrous Sulfate (IRON  PO) Take 1 tablet by mouth daily.   Yes [provider]  ibuprofen (ADVIL) 200 MG tablet Take 200-800 mg by mouth every 6 (six) hours as needed for moderate pain (pain score 4-6).   Yes [provider]  levothyroxine  (SYNTHROID ) 150 MCG tablet Take 150-300 mcg by mouth See admin instructions. Take 150mcg (1 tablet) by mouth 5 days a week, then take 300mcg (2 tablets) 2 days a week.   Yes [provider]  MAGNESIUM  PO Take 1 tablet by mouth daily.   Yes [provider]    Inpatient Medications: Scheduled Meds:  amiodarone  200 mg Oral BID   apixaban   5 mg Oral BID   Chlorhexidine  Gluconate Cloth  6 each Topical Daily   clopidogrel   75 mg Oral Q breakfast   digoxin   0.125 mg Oral Daily   folic acid   1 mg Oral Daily   insulin  aspart  0-15 Units Subcutaneous TID WC   insulin  aspart  0-5 Units Subcutaneous QHS   levothyroxine   150 mcg Oral Q0600   And   levothyroxine   150 mcg Oral Once per day on Wednesday Saturday   multivitamin with minerals  1 tablet Oral Daily   pantoprazole   40 mg Oral Daily   polyethylene glycol  17 g Oral Daily   predniSONE  40 mg Oral Q breakfast   rosuvastatin   40 mg Oral Daily   senna-docusate  1 tablet Oral Daily   sodium  chloride flush  10-40 mL Intracatheter Q12H   sodium chloride  flush  10-40 mL Intracatheter Q12H   sodium chloride  flush  3 mL Intravenous Q12H   spironolactone   12.5 mg Oral Daily   thiamine   100 mg Oral Daily   torsemide  20 mg Oral Daily   Continuous Infusions:  PRN Meds: acetaminophen , diazepam , methocarbamol , nitroGLYCERIN , mouth rinse, sodium chloride  flush, sodium chloride  flush, sodium chloride  flush  Allergies:   No Known Allergies  Social History:   Social History   Socioeconomic History   Marital status: Widowed    Spouse name: Not on file   Number of children: Not on file   Years of education: Not on file   Highest education level: Not on file  Occupational History   Occupation: retired in 3/17  Tobacco  Use   Smoking status: Former    Current packs/day: 0.00    Average packs/day: 3.0 packs/day for 12.0 years (36.0 ttl pk-yrs)    Types: Cigarettes    Start date: 84    Quit date: 1980    Years since quitting: 45.4   Smokeless tobacco: Never  Substance and Sexual Activity   Alcohol use: Yes    Alcohol/week: 80.0 standard drinks of alcohol    Types: 80 Shots of liquor per week    Comment: 09/02/2016 "was at 3 large bottles of cheap bourbon/week; cut down to 2 bottles"   Drug use: Yes    Types: Marijuana    Comment: 09/02/2016 "tried marijuana 2 times; years ago"   Sexual activity: Not Currently  Other Topics Concern   Not on file  Social History Narrative   Not on file   Social Drivers of Health   Financial Resource Strain: Low Risk  (12/17/2022)   Received from [facility]   Overall Financial Resource Strain (CARDIA)    Difficulty of Paying Living Expenses: Not hard at all  Food Insecurity: No Food Insecurity (02/15/2024)   Hunger Vital Sign    Worried About Running Out of Food in the Last Year: Never true    Ran Out of Food in the Last Year: Never true  Transportation Needs: No Transportation Needs (02/15/2024)   PRAPARE - Doctor, general practice (Medical): No    Lack of Transportation (Non-Medical): No  Physical Activity: Inactive (02/07/2022)   Received from [facility]   Exercise Vital Sign    Days of Exercise per Week: 0 days    Minutes of Exercise per Session: 0 min  Stress: No Stress Concern Present (02/07/2022)   Received from [facility] - Occupational Stress Questionnaire    Feeling of Stress : Not at all  Social Connections: Moderately Isolated (02/15/2024)   Social Connection and Isolation Panel [NHANES]    Frequency of Communication with Friends and Family: Twice a week    Frequency of Social Gatherings with Friends and Family: Twice a week    Attends Religious Services: Never    Database administrator or Organizations: No    Attends Engineer, structural: More than 4 times per year    Marital Status: Never married  Intimate Partner Violence: Not At Risk (02/15/2024)   Humiliation, Afraid, Rape, and Kick questionnaire    Fear of Current or Ex-Partner: No    Emotionally Abused: No    Physically Abused: No    Sexually Abused: No    Family History:   Family History  Problem Relation Age of Onset   Thyroid disease Mother    Heart disease Father      ROS:  Please see the history of present illness.  All other ROS reviewed and negative.     Physical Exam/Data:   Vitals:   02/24/24 1041 02/24/24 1052  BP: (!) 94/58 97/63  Pulse:  71  Resp:  17  Temp:  97.6 F (36.4 C)  SpO2:  99%       Body mass index is 32.62 kg/m.  General:  Well nourished, well developed, in no acute distress HEENT: normal Lymph: no adenopathy Neck: no JVD Endocrine:  No thryomegaly Cardiac:  normal S1, S2; irreg irreg, 3/6 SEM heard best @ RUSB. Lungs:  clear to auscultation bilaterally, no wheezing, rhonchi or rales  Abd: soft, nontender,  no hepatomegaly  Ext: no edema Musculoskeletal:  No deformities, BUE and BLE strength  normal and equal Skin: warm and dry  Neuro:  CNs 2-12 intact, no focal abnormalities noted Psych:  Normal affect   EKG:  The EKG was personally reviewed and demonstrates: afib with anterolateral Q waves,  Telemetry: afib    Cardiac Studies & Procedures   ______________________________________________________________________________________________ CARDIAC CATHETERIZATION  CARDIAC CATHETERIZATION 02/22/2024  Conclusion 1. Elevated right and left heart filling pressures. 2. Preserved CO by Fick though low by thermodilution. 3. Moderate pulmonary venous hypertension. 4. Adequate PAPi  Continue to diuresed, will leave off milrinone  for now.   CARDIAC CATHETERIZATION 02/15/2024  Conclusion   Prox LAD to Mid LAD lesion is 100% stenosed.   A drug-eluting stent was successfully placed using a STENT SYNERGY XD 3.0X16.   Post intervention, there is a 0% residual stenosis.   LV end diastolic pressure is severely elevated.   The left ventricular ejection fraction is 25-35% by visual estimate.   Recommend uninterrupted dual antiplatelet therapy with Aspirin  81mg  daily and Clopidogrel  75mg  daily for a minimum of 12 months (ACS-Class I recommendation).  1.  Acute, late presenting STEMI involving the LAD, treated successfully with PCI using PTCA followed by stenting of the proximal LAD with baseline TIMI 0 flow, restored to TIMI-3 flow at the completion of the procedure 2.  Mild nonobstructive plaquing in the left main, left circumflex, and RCA with a heavily calcified RCA 3.  Severe segmental LV dysfunction with akinesis of the entire anterolateral wall, apex, and inferoapex, LVEF estimated at approximately 25 to 30% 4.  Angiographic findings on ventriculography suspicious for LV apical thrombus 5.  Severely elevated LVEDP  Recommendations: DAPT with aspirin  and clopidogrel  minimum of 12 months, check 2D echo with contrast to evaluate for LV apical thrombus and accurately assess LVEF, post MI  medical therapy, IV diuresis with furosemide  in the setting of high LVEDP (acute systolic heart failure secondary to acute anterior infarct).  Institute GDMT as tolerated.  Findings Coronary Findings Diagnostic  Dominance: Right  Left Main The vessel exhibits minimal luminal irregularities. The vessel is calcified. Left main is calcified with no stenosis.  Left Anterior Descending Prox LAD to Mid LAD lesion is 100% stenosed.  Left Circumflex The vessel exhibits minimal luminal irregularities. The circumflex is patent throughout.  The vessel has minimal nonobstructive disease.  Right Coronary Artery Vessel is large. There is mild diffuse disease throughout the vessel. Heavily calcified vessel.  The RCA is dominant and supplies a large PDA branch.  The vessel is patent throughout its course with no significant obstructive disease.  The vessel is heavily calcified with mild plaquing.  Intervention  Prox LAD to Mid LAD lesion Stent CATH VISTA GUIDE 6FR [id number] guide catheter was inserted. Lesion crossed with guidewire using a WIRE HI TORQ WHISPER MS 190CM. Pre-stent angioplasty was performed using a BALLOON SAPPHIRE NC24 3.0X15. Maximum pressure:  12 atm. A drug-eluting stent was successfully placed using a STENT SYNERGY XD 3.0X16. Post-stent angioplasty was performed using a BALLOON [place] EMERGE MR [id number]. Maximum pressure:  16 atm. Post-Intervention Lesion Assessment The intervention was successful. Pre-interventional TIMI flow is 0. Post-intervention TIMI flow is 3. No complications occurred at this lesion. There is a 0% residual stenosis post intervention.     ECHOCARDIOGRAM  ECHOCARDIOGRAM COMPLETE 02/16/2024  Narrative ECHOCARDIOGRAM REPORT    Patient Name:   Mark Meadows Date of Exam: 02/16/2024 Medical Rec #:  [id number]    Height:  68.0 in Accession #:    [id number]   Weight:       224.4 lb Date of Birth:  02/22/1951    BSA:          2.146 m Patient Age:    73 years      BP:           115/86 mmHg Patient Gender: M            HR:           107 bpm. Exam Location:  Inpatient  Procedure: 2D Echo, Cardiac Doppler and Color Doppler (Both Spectral and Color Flow Doppler were utilized during procedure).  Indications:     I50.21 Acute systolic (congestive) heart failure  History:         Patient has prior history of Echocardiogram examinations, most recent 10/06/2017.  Sonographer:     Andrena Bang Referring Phys:  [phone] MICHAEL COOPER Diagnosing Phys: Jackquelyn Mass MD  IMPRESSIONS   1. Akinesis of the mid septum into apex and inferior septum consistent with LAD infarction. LVEF 15-20%. Cannot exclude a layered LV thrombus on contrast imaging. Consider cardiac MRI for definitive evaluation. LVOT VTI 9 cm. CO estimated 1.8 L/min and CI 0.8 L/min/m2, concerning for low output state. Left ventricular ejection fraction, by estimation, is 15-20%. The left ventricle has severely decreased function. The left ventricle demonstrates regional wall motion abnormalities (see scoring diagram/findings for description). There is moderate concentric left ventricular hypertrophy. Indeterminate diastolic filling due to E-A fusion. 2. Severely calcified aortic valve with restricted leaflet movement. Vmax 2.6 m/s, MG 16 mmHG, AVA 0.81 cm2, DI 0.24. This is concerning for low flow low gradient severe aortic stenosis. Recommend clinical correlation. The aortic valve is calcified. Aortic valve regurgitation is trivial. Moderate to severe aortic valve stenosis. 3. Right ventricular systolic function is normal. The right ventricular size is mildly enlarged. Tricuspid regurgitation signal is inadequate for assessing PA pressure. 4. The mitral valve is degenerative. Mild mitral valve regurgitation. No evidence of mitral stenosis. 5. The inferior vena cava is dilated in size with >50% respiratory variability, suggesting right atrial pressure of 8 mmHg.  FINDINGS Left Ventricle: Akinesis of the  mid septum into apex and inferior septum consistent with LAD infarction. LVEF 15-20%. Cannot exclude a layered LV thrombus on contrast imaging. Consider cardiac MRI for definitive evaluation. LVOT VTI 9 cm. CO estimated 1.8 L/min and CI 0.8 L/min/m2, concerning for low output state. Left ventricular ejection fraction, by estimation, is 15-20%. The left ventricle has severely decreased function. The left ventricle demonstrates regional wall motion abnormalities. Definity  contrast agent was given IV to delineate the left ventricular endocardial borders. Strain was performed and the global longitudinal strain is indeterminate. The left ventricular internal cavity size was normal in size. There is moderate concentric left ventricular hypertrophy. Indeterminate diastolic filling due to E-A fusion.   LV Wall Scoring: The mid inferoseptal segment, apical septal segment, apical anterior segment, apical inferior segment, and apex are akinetic.  Right Ventricle: The right ventricular size is mildly enlarged. No increase in right ventricular wall thickness. Right ventricular systolic function is normal. Tricuspid regurgitation signal is inadequate for assessing PA pressure.  Left Atrium: Left atrial size was normal in size.  Right Atrium: Right atrial size was normal in size.  Pericardium: There is no evidence of pericardial effusion.  Mitral Valve: The mitral valve is degenerative in appearance. Mild mitral valve regurgitation. No evidence of mitral valve stenosis. MV peak gradient, 3.9 mmHg. The  mean mitral valve gradient is 2.0 mmHg.  Tricuspid Valve: The tricuspid valve is grossly normal. Tricuspid valve regurgitation is mild . No evidence of tricuspid stenosis.  Aortic Valve: Severely calcified aortic valve with restricted leaflet movement. Vmax 2.6 m/s, MG 16 mmHG, AVA 0.81 cm2, DI 0.24. This is concerning for low flow low gradient severe aortic stenosis. Recommend clinical correlation. The aortic  valve is calcified. Aortic valve regurgitation is trivial. Moderate to severe aortic stenosis is present. Aortic valve mean gradient measures 16.0 mmHg. Aortic valve peak gradient measures 27.2 mmHg. Aortic valve area, by VTI measures 0.81 cm.  Pulmonic Valve: The pulmonic valve was grossly normal. Pulmonic valve regurgitation is not visualized. No evidence of pulmonic stenosis.  Aorta: The aortic root and ascending aorta are structurally normal, with no evidence of dilitation.  Venous: The inferior vena cava is dilated in size with greater than 50% respiratory variability, suggesting right atrial pressure of 8 mmHg.  IAS/Shunts: The atrial septum is grossly normal.   LEFT VENTRICLE PLAX 2D LVIDd:         3.60 cm      Diastology LVIDs:         3.30 cm      LV e' lateral: 8.70 cm/s LV PW:         1.50 cm LV IVS:        1.40 cm LVOT diam:     2.20 cm LV SV:         36 LV SV Index:   17 LVOT Area:     3.80 cm  LV Volumes (MOD) LV vol d, MOD A2C: 177.0 ml LV vol d, MOD A4C: 170.0 ml LV vol s, MOD A2C: 138.0 ml LV vol s, MOD A4C: 108.0 ml LV SV MOD A2C:     39.0 ml LV SV MOD A4C:     170.0 ml LV SV MOD BP:      55.4 ml  RIGHT VENTRICLE RV S prime:     10.30 cm/s TAPSE (M-mode): 1.4 cm  LEFT ATRIUM           Index LA Vol (A2C): 88.8 ml 41.37 ml/m LA Vol (A4C): 55.5 ml 25.86 ml/m AORTIC VALVE AV Area (Vmax):    0.82 cm AV Area (Vmean):   0.65 cm AV Area (VTI):     0.81 cm AV Vmax:           261.00 cm/s AV Vmean:          187.000 cm/s AV VTI:            0.450 m AV Peak Grad:      27.2 mmHg AV Mean Grad:      16.0 mmHg LVOT Vmax:         56.00 cm/s LVOT Vmean:        32.200 cm/s LVOT VTI:          0.096 m LVOT/AV VTI ratio: 0.21  AORTA Ao Asc diam: 3.40 cm  MITRAL VALVE MV Area VTI:  1.81 cm   SHUNTS MV Peak grad: 3.9 mmHg   Systemic VTI:  0.10 m MV Mean grad: 2.0 mmHg   Systemic Diam: 2.20 cm MV Vmax:      0.99 m/s MV Vmean:     67.5 cm/s  Jackquelyn Mass  MD Electronically signed by Jackquelyn Mass MD Signature Date/Time: 02/16/2024/11:50:50 AM    Final (Updated)          ______________________________________________________________________________________________      Laboratory  Data:  High Sensitivity Troponin:   Recent Labs  Lab 02/15/24 1842 02/15/24 2139  TROPONINIHS >24,000* >24,000*     Chemistry Recent Labs  Lab 02/22/24 0510 02/22/24 0930 02/23/24 0500 02/24/24 0500  NA 132* 137  138 134* 135  K 4.0 4.2  4.1 3.8 4.1  CL 105  --  103 105  CO2 20*  --  20* 21*  GLUCOSE 177*  --  172* 169*  BUN 26*  --  34* 37*  CREATININE 1.59*  --  1.88* 1.47*  CALCIUM  7.6*  --  8.2* 8.7*  GFRNONAA 45*  --  37* 50*  ANIONGAP 7  --  11 9    No results for input(s): "PROT", "ALBUMIN", "AST", "ALT", "ALKPHOS", "BILITOT" in the last 168 hours. Hematology Recent Labs  Lab 02/22/24 0510 02/22/24 0930 02/23/24 0500 02/24/24 0500  WBC 11.9*  --  11.6* 10.5  RBC 3.33*  --  3.58* 3.47*  HGB 8.0* 9.5*  9.5* 8.6* 8.4*  HCT 28.3* 28.0*  28.0* 29.7* 28.0*  MCV 85.0  --  83.0 80.7  MCH 24.0*  --  24.0* 24.2*  MCHC 28.3*  --  29.0* 30.0  RDW 18.6*  --  18.2* 18.1*  PLT 311  --  339 312   BNPNo results for input(s): "BNP", "PROBNP" in the last 168 hours.  DDimer No results for input(s): "DDIMER" in the last 168 hours.   Radiology/Studies:  CARDIAC CATHETERIZATION Result Date: 02/22/2024 1. Elevated right and left heart filling pressures. 2. Preserved CO by Fick though low by thermodilution. 3. Moderate pulmonary venous hypertension. 4. Adequate PAPi Continue to diuresed, will leave off milrinone  for now.    Procedure Type: Isolated AVR Perioperative Outcome Estimate % Operative Mortality 20.6% Morbidity & Mortality 36.5% Stroke 2.4% Renal Failure 6.9% Reoperation 5.81% Prolonged Ventilation 29.8% Deep Sternal Wound Infection 0.196% Long Hospital Stay (>14 days) 28.1% Short Hospital Stay (<6  days)* 15.3%  ____________________________  [place] Cardiomyopathy Questionnaire     02/24/2024    1:01 PM  KCCQ-12  1 a. Ability to shower/bathe Extremely limited  1 b. Ability to walk 1 block Extremely limited  1 c. Ability to hurry/jog Extremely limited  2. Edema feet/ankles/legs 1-2 times a week  3. Limited by fatigue All of the time  4. Limited by dyspnea All of the time  5. Sitting up / on 3+ pillows 1-2 times a week  6. Limited enjoyment of life Limited quite a bit  7. Rest of life w/ symptoms Mostly dissatisfied  8 a. Participation in hobbies Severely limited  8 b. Participation in chores Severely limited  8 c. Visiting family/friends Severely limited    ___________________________________  Pre Surgical Assessment: 5 M Walk Test  82M=16.51ft  5 Meter Walk Test- trial 1: 8 seconds 5 Meter Walk Test- trial 2: 8.1 seconds 5 Meter Walk Test- trial 3: 8.0 seconds 5 Meter Walk Test Average: 8 seconds    Assessment and Plan:   Mark Meadows is a 73 y.o. male with symptoms of severe, stage D2 aortic stenosis with NYHA Class IV symptoms currently admitted for acute CHF and cardiogenic shock after a late presenting anterolateral STEMI s/p PCI to LAD.   Echo with EF 15-20%, probable LV thrombus, normal RV and low flow low gradient severe AS; AVA 0.81 cm2 an, MG 16 mmhg, DI 0.24, SVI 17.  RHC 02/22/24 showed elevated right and left sided filling pressures, moderate pulmonary venous hypertension, Fick CI 2.4, TD CI 1.97.  CO-OX 44%, repeat today 39% -estimated Fick CI 1.7 CO 5.5.  Per Dr. Sabharwal, pt may benefit from TAVR and normal restoring sinus rhythm. If these do not help, LVAD is a potential future option.    I have reviewed the natural history of aortic stenosis with the patient. We have discussed the limitations of medical therapy and the poor prognosis associated with symptomatic aortic stenosis. We have reviewed potential treatment options, including palliative medical  therapy, conventional surgical aortic valve replacement, and transcatheter aortic valve replacement. We discussed treatment options in the context of this patient's specific comorbid medical conditions.    The patient's predicted risk of mortality with conventional aortic valve replacement is 20.6% primarily based on his age, severe LV dysfunction, cardiogenic shock, renal function, afib, obesity. Other significant comorbid conditions include baseline poor functional status and poor dentition. TAVR seems like a reasonable treatment option for this patient pending formal cardiac surgical consultation. We discussed typical evaluation which will require a gated cardiac CTA and a CTA of the chest/abdomen/pelvis to evaluate both his cardiac anatomy and peripheral vasculature.   Plan to get TAVR scans today. Will given 0.25mcg of digoxin  to try to lower HR for scans. No BBs given shock and low CO/CI. Orthopantogram yesterday showed multiple dental caries, periapical abscesses and missing teeth. He will need dental extractions, but will likely have to wait until after TAVR as he would be extremely high risk for sedation and full mouth extraction. Other issues Is possible LV thrombus. Dr. Bruce Caper to get cMRI to assess.   Dr. Abel Hoe to follow.    For questions or updates, please contact [facility] Please consult [email or web address] for contact info under    Signed, Abagail Hoar, PA-C  02/24/2024 1:51 PM   I have personally seen and examined this patient. I agree with the assessment and plan as outlined above.  73 yo male with history of HTN, HLD, CKD stage 3b, DM, PAF and aortic stenosis admitted with an acute anterior STEMI last week. His mid LAD was treated with a drug eluting stent. He was found to have severe LV dysfunction and has been in cardiogenic shock over the past week. He is being followed by the AHF team. 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  Echo with severe low flow /low gradient aortic  stenosis.  We are asked to see him to discuss his candidacy for TAVR He has completed his cardiac CT and his annulus appears suitable for a 29 mm Edwards Sapien 3 Ultra Resilia valve. CTA with findings appropriate for femoral artery access for TAVR.  Cardiac MRI today with evidence of residual LV thrombus.  EKG reviewed by me Labs reviewed by me Telemetry with sinus My exam: NAD  RRR with systolic murmur Lungs clear Ext: no LE edema Plan: Severe aortic low flow/low gradient aortic stenosis: We have reviewed his options for TAVR. He sizes to a 29 mm Sapien valve and has femoral access. Unfortunately he has residual LV thrombus by MRI today. TAVR would put him at high risk for a cardioembolic event. He understands this. Will delay TAVR for now. Continue to optimize medically. We will follow along with you.   Antoinette Batman, MD, [facility] 02/26/2024 5:04 PM

## 2024-02-24 NOTE — Progress Notes (Addendum)
 Patient ID: Mark Meadows, male   DOB: 29-Oct-1950, 73 y.o.   MRN: [id number]     Advanced Heart Failure Rounding Note  Cardiologist: None  Chief Complaint: Cardiogenic Shock   Subjective:   5/26 Late presenting anterolateral STEMI s/p PCI/DES to LAD 5/27 CO-OX 29%. Started on 0.25 milrinone . 5/30 Milrinone  stopped 06/02 RHC - elevated filling pressures, preserved CO by Fick, low by TD, moderate pulmonary venous hypertension  CO-OX 44%, repeat now.   Frustrated wants to go home.    Objective:   Weight Range: 96.1 kg Body mass index is 32.21 kg/m.   Vital Signs:   Temp:  [97.7 F (36.5 C)-98.4 F (36.9 C)] 98.4 F (36.9 C) (06/04 0733) Pulse Rate:  [69-105] 99 (06/04 0744) Resp:  [11-34] 21 (06/04 0744) BP: (78-134)/(35-98) 120/98 (06/04 0733) SpO2:  [92 %-100 %] 96 % (06/04 0744) Last BM Date : 02/23/24  Weight change: Filed Weights   02/20/24 0917 02/21/24 0600 02/22/24 0700  Weight: 99.1 kg 98.2 kg 96.1 kg    Intake/Output:   Intake/Output Summary (Last 24 hours) at 02/24/2024 0806 Last data filed at 02/24/2024 0433 Gross per 24 hour  Intake 274.99 ml  Output 1350 ml  Net -1075.01 ml     CVP 8  Physical Exam  General:   No resp difficulty Neck: supple. no JVD.  Cor: PMI nondisplaced. Irregular rate & rhythm. No rubs, gallops . RUSB AS. Lungs: clear Abdomen: soft, nontender, nondistended.  Extremities: no cyanosis, clubbing, rash, edema Neuro: alert & oriented x3   Telemetry  A fib 90-100s   EKG   N/A  Labs    CBC Recent Labs    02/23/24 0500 02/24/24 0500  WBC 11.6* 10.5  HGB 8.6* 8.4*  HCT 29.7* 28.0*  MCV 83.0 80.7  PLT 339 312   Basic Metabolic Panel Recent Labs    40/98/11 0500 02/24/24 0500  NA 134* 135  K 3.8 4.1  CL 103 105  CO2 20* 21*  GLUCOSE 172* 169*  BUN 34* 37*  CREATININE 1.88* 1.47*  CALCIUM  8.2* 8.7*     Other results:   Imaging   - No new imaging   Medications:     Scheduled Medications:   amiodarone  200 mg Oral BID   apixaban   5 mg Oral BID   Chlorhexidine  Gluconate Cloth  6 each Topical Daily   clopidogrel   75 mg Oral Q breakfast   digoxin   0.125 mg Oral Daily   folic acid   1 mg Oral Daily   insulin  aspart  0-15 Units Subcutaneous TID WC   insulin  aspart  0-5 Units Subcutaneous QHS   levothyroxine   150 mcg Oral Q0600   And   levothyroxine   150 mcg Oral Once per day on Wednesday Saturday   multivitamin with minerals  1 tablet Oral Daily   pantoprazole   40 mg Oral Daily   polyethylene glycol  17 g Oral Daily   predniSONE  40 mg Oral Q breakfast   rosuvastatin   40 mg Oral Daily   senna-docusate  1 tablet Oral Daily   sodium chloride  flush  10-40 mL Intracatheter Q12H   sodium chloride  flush  10-40 mL Intracatheter Q12H   sodium chloride  flush  3 mL Intravenous Q12H   spironolactone   12.5 mg Oral Daily   thiamine   100 mg Oral Daily   torsemide  20 mg Oral Daily    Infusions:    PRN Medications: acetaminophen , diazepam , methocarbamol , nitroGLYCERIN , mouth rinse, sodium chloride  flush,  sodium chloride  flush, sodium chloride  flush    Patient Profile   Mark Meadows is a 73 year old with a history of HTN, HLD, and tobacco abuse. Admitted with late presenting anterior STEMI c/b cardiogenic shock.  Assessment/Plan  1. Anterior STEMI with CAD -HS Trop >24K. Cath with 100% p to m LAD treated with PTCA/DES. 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  -Stopped aspirin . Continue plavix  alone with need for anticoagulation. -Continue crestor  40 mg daily  -No chest pain.    2. Cardiogenic Shock: Acute HFrEF/ischemic cardiomyopathy.  - Echo EF 30% on admit.  - Echo 5/27 EF 15-20%, RV normal with severe AS.  - RHC 02/22/24: Elevated right and left sided filling pressures, moderate pulmonary venous hypertension, Fick CI 2.4, TD CI 1.97 - CO-OX 44% , repeat now.  -->39% -Estimated  Fick CI 1.7 CO 5.5 - Continue digoxin  0.125, dig level 0.6 today - Continue spiro 12.5 mg daily    3. Aortic valve stenosis - Echo 05/27: severely calcified aortic valve with restricted leaflets, AVA 0.81 cm2 an, MG 16 and DI 0.24, concerning for low flow low gradient AS. -Discussed with structural team this admission. Plan for outpatient TAVR evaluation. Will reach out to team for follow-up.   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 CKD Stage IIIa -Unclear baseline. Scr 1.6-1.8 this admit. - sCr stable.  Start Torsemide today    6. Hypothyroid -TSH 6.  -On Levothyroxine .   7. Iron  deficiency anemia - 1 u RBCs on 05/29 - Hgb slowly improving, 8.4 today  - CTA 05/29 with no evidence of bleed - May need GI workup - Haptoglobin high which would go against Heyde syndrome -Given IV Iron .   8. Gout flare - Involving left ankle and great toe - Started colchicine  05/30. Some improvement in pain but still bothersome. Completed prednisone burst.  Stop colchicine .  9. Atrial fibrillation - Remains in A fib rate 90s . Obtain EKG today.  - Continue amiodarone 200 BID -Give 150 mg amio bolus.  - Continue Eliquis  - Would hold off on DCCV until after TAVR  Repeat CO-OX 39%. Will need RHC   Length of Stay: 9  Julies Carmickle, NP  02/24/2024, 8:06 AM  Advanced Heart Failure Team Pager [phone] (M-F; 7a - 5p)  Please contact [facility] for night-coverage after hours (5p -7a ) and weekends on [email or web address]

## 2024-02-24 NOTE — Progress Notes (Signed)
 Physical Therapy Treatment Patient Details Name: Mark Meadows MRN: [id number] DOB: 1951-03-13 Today's Date: 02/24/2024   History of Present Illness 73 y.o. male presents to [facility] 02/15/24 as transfer from [facility] ED for evaluation of STEMI, stent placed. Complicated by cardiogenic shock. PMHx: HTN, gout, thrombocytopenia, anemia, dysrhythmia, CKD stage 3a.  Heart Cath procedure 02/22/24.    PT Comments  Pt resting in bed on arrival and agreeable to session with continued progress towards acute goals. Pt continues to be limited in LE pain, R>L, limiting activity tolerance and time in weight bearing. Pt demonstrating bed mobility, transfers and gait with RW for support with grossly CGA-supervision for safety. Pt agreeable to time up in chair at end of session and receptive to education on continued walker use to maximize functional independence, safety, and decrease risk for falls. Pt continues to benefit from skilled PT services to progress toward functional mobility goals.      If plan is discharge home, recommend the following: Assistance with cooking/housework;Assist for transportation;Help with stairs or ramp for entrance;A little help with walking and/or transfers;A little help with bathing/dressing/bathroom   Can travel by private vehicle        Equipment Recommendations  Other (comment) (pt requested/MD ordered rollator (pt now declining rollator))    Recommendations for Other Services       Precautions / Restrictions Precautions Precautions: Fall Precaution/Restrictions Comments: watch O2 Restrictions Weight Bearing Restrictions Per Provider Order: No     Mobility  Bed Mobility Overal bed mobility: Needs Assistance Bed Mobility: Supine to Sit, Sit to Supine     Supine to sit: Supervision, HOB elevated, Used rails     General bed mobility comments: supervision for safety with use of bed features    Transfers Overall transfer level: Needs assistance Equipment used: Rolling  walker (2 wheels), None Transfers: Sit to/from Stand Sit to Stand: Contact guard assist, Min assist           General transfer comment: VCs for safe hand placement on rise, able to stand without RW with min A on inisital stand for steadying assist to then step up to scale, CGA from EOB to RW    Ambulation/Gait Ambulation/Gait assistance: Supervision Gait Distance (Feet): 192 Feet Assistive device: Rolling walker (2 wheels) Gait Pattern/deviations: Step-through pattern, Decreased stance time - right, Decreased stride length, Decreased weight shift to right, Antalgic, Trunk flexed Gait velocity: decr     General Gait Details: continues with pain in LEs R>L, cues for trunk extension and forward gaze   Stairs             Wheelchair Mobility     Tilt Bed    Modified Rankin (Stroke Patients Only)       Balance Overall balance assessment: Needs assistance Sitting-balance support: No upper extremity supported, Feet supported Sitting balance-Leahy Scale: Good     Standing balance support: Reliant on assistive device for balance Standing balance-Leahy Scale: Poor Standing balance comment: reliant on RW and UE support                            Communication Communication Communication: No apparent difficulties  Cognition Arousal: Alert Behavior During Therapy: WFL for tasks assessed/performed   PT - Cognitive impairments: No apparent impairments                         Following commands: Intact      Cueing Cueing Techniques:  Verbal cues  Exercises      General Comments General comments (skin integrity, edema, etc.): VSS on RA      Pertinent Vitals/Pain Pain Assessment Pain Assessment: Faces Faces Pain Scale: Hurts little more Pain Location: R knee Pain Descriptors / Indicators: Grimacing, Guarding, Sharp Pain Intervention(s): Monitored during session, Limited activity within patient's tolerance, Repositioned    Home Living                           Prior Function            PT Goals (current goals can now be found in the care plan section) Acute Rehab PT Goals Patient Stated Goal: to have less pain when moving PT Goal Formulation: With patient Time For Goal Achievement: 03/02/24 Progress towards PT goals: Progressing toward goals    Frequency    Min 2X/week      PT Plan      Co-evaluation              AM-PAC PT "6 Clicks" Mobility   Outcome Measure  Help needed turning from your back to your side while in a flat bed without using bedrails?: A Little Help needed moving from lying on your back to sitting on the side of a flat bed without using bedrails?: A Little Help needed moving to and from a bed to a chair (including a wheelchair)?: A Little Help needed standing up from a chair using your arms (e.g., wheelchair or bedside chair)?: A Little Help needed to walk in hospital room?: A Little Help needed climbing 3-5 steps with a railing? : A Little 6 Click Score: 18    End of Session Equipment Utilized During Treatment: Gait belt Activity Tolerance: No increased pain;Patient tolerated treatment well Patient left: with call bell/phone within reach;in chair Nurse Communication: Mobility status PT Visit Diagnosis: Other abnormalities of gait and mobility (R26.89);Unsteadiness on feet (R26.81)     Time: 0981-1914 PT Time Calculation (min) (ACUTE ONLY): 17 min  Charges:    $Gait Training: 8-22 mins PT General Charges $$ ACUTE PT VISIT: 1 Visit                     Mark Meadows R. PTA Acute Rehabilitation Services Office: [phone]   Mark Meadows 02/24/2024, 10:04 AM

## 2024-02-25 ENCOUNTER — Inpatient Hospital Stay (HOSPITAL_COMMUNITY)

## 2024-02-25 ENCOUNTER — Encounter (HOSPITAL_COMMUNITY): Payer: Self-pay | Admitting: Cardiovascular Disease

## 2024-02-25 DIAGNOSIS — I350 Nonrheumatic aortic (valve) stenosis: Secondary | ICD-10-CM

## 2024-02-25 DIAGNOSIS — I340 Nonrheumatic mitral (valve) insufficiency: Secondary | ICD-10-CM

## 2024-02-25 LAB — BASIC METABOLIC PANEL WITH GFR
Anion gap: 7 (ref 5–15)
BUN: 43 mg/dL — ABNORMAL HIGH (ref 8–23)
CO2: 23 mmol/L (ref 22–32)
Calcium: 9 mg/dL (ref 8.9–10.3)
Chloride: 104 mmol/L (ref 98–111)
Creatinine, Ser: 1.56 mg/dL — ABNORMAL HIGH (ref 0.61–1.24)
GFR, Estimated: 46 mL/min — ABNORMAL LOW (ref >60)
Glucose, Bld: 203 mg/dL — ABNORMAL HIGH (ref 70–99)
Potassium: 4.7 mmol/L (ref 3.5–5.1)
Sodium: 134 mmol/L — ABNORMAL LOW (ref 135–145)

## 2024-02-25 LAB — GLUCOSE, CAPILLARY
Glucose-Capillary: 329 mg/dL — ABNORMAL HIGH (ref 70–99)
Glucose-Capillary: 191 mg/dL — ABNORMAL HIGH (ref 70–99)
Glucose-Capillary: 222 mg/dL — ABNORMAL HIGH (ref 70–99)
Glucose-Capillary: 308 mg/dL — ABNORMAL HIGH (ref 70–99)
Glucose-Capillary: 318 mg/dL — ABNORMAL HIGH (ref 70–99)

## 2024-02-25 LAB — CBC
HCT: 28.6 % — ABNORMAL LOW (ref 39.0–52.0)
Hemoglobin: 8.3 g/dL — ABNORMAL LOW (ref 13.0–17.0)
MCH: 23.4 pg — ABNORMAL LOW (ref 26.0–34.0)
MCHC: 29 g/dL — ABNORMAL LOW (ref 30.0–36.0)
MCV: 80.6 fL (ref 80.0–100.0)
Platelets: 366 10*3/uL (ref 150–400)
RBC: 3.55 MIL/uL — ABNORMAL LOW (ref 4.22–5.81)
RDW: 18 % — ABNORMAL HIGH (ref 11.5–15.5)
WBC: 13.8 10*3/uL — ABNORMAL HIGH (ref 4.0–10.5)
nRBC: 0 % (ref 0.0–0.2)

## 2024-02-25 LAB — COOXEMETRY PANEL
Carboxyhemoglobin: 1.2 % (ref 0.5–1.5)
Methemoglobin: <0.7 % (ref 0.0–1.5)
O2 Saturation: 41.8 %
Total hemoglobin: 8.8 g/dL — ABNORMAL LOW (ref 12.0–16.0)

## 2024-02-25 MED ORDER — DIGOXIN 125 MCG PO TABS: 0.1250 mg | ORAL_TABLET | Freq: Once | ORAL | Status: DC

## 2024-02-25 MED ORDER — GADOBUTROL 1 MMOL/ML IV SOLN
10.0000 mL | Freq: Once | INTRAVENOUS | Status: AC | PRN
  Administered 2024-02-25: 10 mL via INTRAVENOUS

## 2024-02-25 MED ORDER — HEPARIN (PORCINE) 25000 UT/250ML-% IV SOLN
1700.0000 [IU]/h | INTRAVENOUS | Status: AC
  Administered 2024-02-26: 1000 [IU]/h via INTRAVENOUS
  Administered 2024-02-27: 1300 [IU]/h via INTRAVENOUS
  Administered 2024-02-28: 1500 [IU]/h via INTRAVENOUS
  Filled 2024-02-25 (×3): qty 250

## 2024-02-25 MED ORDER — DIGOXIN 125 MCG PO TABS
0.2500 mg | ORAL_TABLET | Freq: Once | ORAL | Status: AC
  Administered 2024-02-25: 0.25 mg via ORAL
  Filled 2024-02-25: qty 2

## 2024-02-25 MED ORDER — DIGOXIN 125 MCG PO TABS
0.1250 mg | ORAL_TABLET | Freq: Every day | ORAL | Status: DC
  Administered 2024-02-26 – 2024-02-29 (×4): 0.125 mg via ORAL
  Filled 2024-02-25 (×4): qty 1

## 2024-02-25 MED ORDER — IOHEXOL 350 MG/ML SOLN
95.0000 mL | Freq: Once | INTRAVENOUS | Status: AC | PRN
  Administered 2024-02-25: 95 mL via INTRAVENOUS

## 2024-02-25 NOTE — Progress Notes (Signed)
 PHARMACY - ANTICOAGULATION CONSULT NOTE  Pharmacy Consult for hold apixaban  > start IV heparin  Indication:  LV thrombus  No Known Allergies  Patient Measurements: Height: 5\' 8"  (172.7 cm) Weight: 97.1 kg (214 lb 1.1 oz) IBW/kg (Calculated) : 68.4 HEPARIN  DW (KG): 90.4  Vital Signs: Temp: 97.7 F (36.5 C) (06/05 0752) Temp Source: Oral (06/05 1212) BP: 112/80 (06/05 1212) Pulse Rate: 97 (06/05 1212)  Labs: Recent Labs    02/23/24 0500 02/24/24 0500 02/25/24 0440  HGB 8.6* 8.4* 8.3*  HCT 29.7* 28.0* 28.6*  PLT 339 312 366  CREATININE 1.88* 1.47* 1.56*    Estimated Creatinine Clearance: 46.9 mL/min (A) (by C-G formula based on SCr of 1.56 mg/dL (H)).   Medical History: Past Medical History:  Diagnosis Date   Acute blood loss anemia    Alcohol dependence (HCC)    Alcoholic hepatitis    Dysrhythmia    a. 08/2016 -?afib, not clear. b. 09/2017 - official dx atrial flutter.   E. coli UTI 09/22/2016   GERD (gastroesophageal reflux disease)    Gout    Hematochezia    Hyperglycemia    Hypertension    Hypokalemia    Hypothyroidism    Thrombocytopenia (HCC)     Medications:  Infusions:   [START ON 02/26/2024] heparin       Assessment: 73 yo male admitted with STEMI, found with LV thrombus and started on Eliquis .  However, now requiring TAVR next Tuesday and pharmacy asked to switch to IV heparin  in the meantime.  Goal of Therapy:  Heparin  level 0.3-0.7 units/ml aPTT 66-102 seconds Monitor platelets by anticoagulation protocol: Yes   Plan:  Stop apixaban  after tonight's 10pm dose Start IV heparin  at 1000 units/hr at 10 AM tomorrow (6/6) Check aptt and heparin  level 8 hrs after heparin  gtt starts Daily heparin  level, aPTT and cbc.  Joanell Mowers, Davey Erp, BCCP Clinical Pharmacist  02/25/2024 1:40 PM   [facility] pharmacy phone numbers are listed on [email or web address]

## 2024-02-25 NOTE — Progress Notes (Signed)
 Patient ID: Mark Meadows, male   DOB: [birth year], 73 y.o.   MRN: [id number]     Advanced Heart Failure Rounding Note  Cardiologist: Peder Bourdon, MD  Chief Complaint: acute systolic heart failure, LFLG AS   Patient Profile   Mark Meadows is a 73 year old with a history of HTN, HLD, and tobacco abuse. Admitted with late presenting anterior STEMI c/b cardiogenic shock. Course further c/b LFLG AS.   Subjective:   5/26 Late presenting anterolateral STEMI s/p PCI/DES to LAD 5/27 CO-OX 29%. Started on 0.25 milrinone . 5/30 Milrinone  stopped 06/02 RHC - elevated filling pressures, preserved CO by Fick, low by TD, moderate pulmonary venous hypertension  Co-ox 42% though appears stable on exam. Hgb 8.3. Calculated FICK CI 1.7   Scr stable, 1.56.   CVP 4-5. SBPs low 100s 110s   OOB, sitting up in chair. Says he is feeling much better. Breathing overall improved. Denies CP. Awaiting pre TAVR scans today.       Objective:   Weight Range: 97.1 kg Body mass index is 32.55 kg/m.   Vital Signs:   Temp:  [97.6 F (36.4 C)-98 F (36.7 C)] 97.7 F (36.5 C) (06/05 0752) Pulse Rate:  [71-101] 100 (06/05 0752) Resp:  [17-22] 20 (06/05 0752) BP: (94-114)/(58-79) 109/79 (06/05 0752) SpO2:  [96 %-99 %] 98 % (06/05 0752) Weight:  [97.1 kg-97.3 kg] 97.1 kg (06/05 0447) Last BM Date : 02/24/24  Weight change: Filed Weights   02/22/24 0700 02/24/24 0911 02/25/24 0447  Weight: 96.1 kg 97.3 kg 97.1 kg    Intake/Output:   Intake/Output Summary (Last 24 hours) at 02/25/2024 0857 Last data filed at 02/25/2024 0756 Gross per 24 hour  Intake 940.53 ml  Output 2050 ml  Net -1109.47 ml      Physical Exam   CVP 4-5 General:  Well appearing. No respiratory difficulty HEENT: normal Neck: supple. no JVD. Cor: Irregularly irregular R&R, 3/6 AS murmur  Lungs: clear Abdomen: soft, nontender, mildly distended.  Extremities: no cyanosis, clubbing, rash, edema Neuro: alert & oriented x 3, cranial  nerves grossly intact. moves all 4 extremities w/o difficulty. Affect pleasant.   Telemetry   AFib, low 80s, personally reviewed   EKG   N/A  Labs    CBC Recent Labs    02/24/24 0500 02/25/24 0440  WBC 10.5 13.8*  HGB 8.4* 8.3*  HCT 28.0* 28.6*  MCV 80.7 80.6  PLT 312 366   Basic Metabolic Panel Recent Labs    95/62/13 0500 02/25/24 0440  NA 135 134*  K 4.1 4.7  CL 105 104  CO2 21* 23  GLUCOSE 169* 203*  BUN 37* 43*  CREATININE 1.47* 1.56*  CALCIUM  8.7* 9.0     Other results:   Imaging   - No new imaging   Medications:     Scheduled Medications:  amiodarone  200 mg Oral BID   apixaban   5 mg Oral BID   Chlorhexidine  Gluconate Cloth  6 each Topical Daily   clopidogrel   75 mg Oral Q breakfast   [START ON 02/26/2024] digoxin   0.125 mg Oral Daily   folic acid   1 mg Oral Daily   insulin  aspart  0-15 Units Subcutaneous TID WC   insulin  aspart  0-5 Units Subcutaneous QHS   levothyroxine   150 mcg Oral Q0600   And   levothyroxine   150 mcg Oral Once per day on Wednesday Saturday   multivitamin with minerals  1 tablet Oral Daily   pantoprazole   40 mg Oral Daily   polyethylene glycol  17 g Oral Daily   predniSONE  40 mg Oral Q breakfast   rosuvastatin   40 mg Oral Daily   senna-docusate  1 tablet Oral Daily   sodium chloride  flush  10-40 mL Intracatheter Q12H   sodium chloride  flush  10-40 mL Intracatheter Q12H   sodium chloride  flush  3 mL Intravenous Q12H   spironolactone   12.5 mg Oral Daily   thiamine   100 mg Oral Daily   torsemide  20 mg Oral Daily    Infusions:    PRN Medications: acetaminophen , diazepam , methocarbamol , nitroGLYCERIN , mouth rinse, sodium chloride  flush, sodium chloride  flush, sodium chloride  flush      Assessment/Plan  1. Anterior STEMI with CAD -HS Trop >24K. Cath with 100% p to m LAD treated with PTCA/DES. 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  -Stopped aspirin . Continue plavix  alone with need for  anticoagulation. -Continue crestor  40 mg daily  -Stable w/o CP    2. Cardiogenic Shock: Acute HFrEF/ischemic cardiomyopathy.  - Echo EF 30% on admit.  - Echo 5/27 EF 15-20%, RV normal with severe AS.  - RHC 02/22/24: Elevated right and left sided filling pressures, moderate pulmonary venous hypertension, Fick CI 2.4, TD CI 1.97 - CO-OX 42%, Estimated  Fick CI 1.7. Appears stable on exam. Renal fx stable   - Continue digoxin  0.125 - Continue spiro 12.5 mg daily - Avoiding aggressive GDMT titration given soft BP and severe AS  - CVP 4-5, continue torsemide 20 mg daily    3. Aortic valve stenosis - Echo 05/27: severely calcified aortic valve with restricted leaflets, AVA 0.81 cm2 an, MG 16 and DI 0.24, concerning for low flow low gradient AS. -Discussed with structural team this admission. Plan for inpatient TAVR evaluation. 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 CKD Stage IIIa -Unclear baseline. Scr 1.6-1.8 this admit. -SCr stable 1.6 today  - follow BMP    6. Hypothyroid -TSH 6.  -On Levothyroxine .   7. 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   8. Gout flare - Involving left ankle and great toe - Started colchicine  05/30. Some improvement in pain but still bothersome. Completed prednisone burst.   - now off colchicine .  9. Atrial fibrillation - Remains in A fib rate 80s . - Continue amiodarone 200 BID - Continue Eliquis  - Would hold off on DCCV until after TAVR   Length of Stay: [address] Saralee Cummins, PA-C  02/25/2024, 8:57 AM  Advanced Heart Failure Team Pager [phone] (M-F; 7a - 5p)  Please contact [facility] for night-coverage after hours (5p -7a ) and weekends on [email or web address]

## 2024-02-25 NOTE — Inpatient Diabetes Management (Signed)
 Inpatient Diabetes Program Recommendations  AACE/ADA: New Consensus Statement on Inpatient Glycemic Control (2015)  Target Ranges:  Prepandial:   less than 140 mg/dL      Peak postprandial:   less than 180 mg/dL (1-2 hours)      Critically ill patients:  140 - 180 mg/dL   Lab Results  Component Value Date   GLUCAP 191 (H) 02/25/2024   HGBA1C 6.4 (H) 02/15/2024    Latest Reference Range & Units 02/24/24 06:12 02/24/24 11:15 02/24/24 16:04 02/24/24 21:08 02/25/24 06:00  Glucose-Capillary 70 - 99 mg/dL 259 (H) 563 (H) 875 (H) 400 (H) 191 (H)  (H): Data is abnormally high  Diabetes history: Pre-DM, DM 2 Outpatient Diabetes medications: None (was prescribed Metformin in the past but never took it) Current orders for Inpatient glycemic control:  Novolog  0-15 units tid + hs Prednisone 40 mg daily  Inpatient Diabetes Program Recommendations:   While on steroids, please consider: -Add Novolog  3 units tid meal coverage if eats 50%  Thank you, Pearlean Sabina E. Juanantonio Stolar, RN, MSN, CDCES  Diabetes Coordinator Inpatient Glycemic Control Team Team Pager [phone] (8am-5pm) 02/25/2024 9:31 AM

## 2024-02-26 LAB — BASIC METABOLIC PANEL WITH GFR
Anion gap: 9 (ref 5–15)
BUN: 47 mg/dL — ABNORMAL HIGH (ref 8–23)
CO2: 22 mmol/L (ref 22–32)
Calcium: 8.7 mg/dL — ABNORMAL LOW (ref 8.9–10.3)
Chloride: 100 mmol/L (ref 98–111)
Creatinine, Ser: 2 mg/dL — ABNORMAL HIGH (ref 0.61–1.24)
GFR, Estimated: 34 mL/min — ABNORMAL LOW (ref >60)
Glucose, Bld: 283 mg/dL — ABNORMAL HIGH (ref 70–99)
Potassium: 4.8 mmol/L (ref 3.5–5.1)
Sodium: 131 mmol/L — ABNORMAL LOW (ref 135–145)

## 2024-02-26 LAB — GLUCOSE, CAPILLARY
Glucose-Capillary: 337 mg/dL — ABNORMAL HIGH (ref 70–99)
Glucose-Capillary: 165 mg/dL — ABNORMAL HIGH (ref 70–99)
Glucose-Capillary: 305 mg/dL — ABNORMAL HIGH (ref 70–99)
Glucose-Capillary: 354 mg/dL — ABNORMAL HIGH (ref 70–99)

## 2024-02-26 LAB — HEPARIN LEVEL (UNFRACTIONATED): Heparin Unfractionated: >1.1 [IU]/mL — ABNORMAL HIGH (ref 0.30–0.70)

## 2024-02-26 LAB — APTT: aPTT: 43 s — ABNORMAL HIGH (ref 24–36)

## 2024-02-26 MED ORDER — INSULIN ASPART 100 UNIT/ML IJ SOLN
3.0000 [IU] | Freq: Three times a day (TID) | INTRAMUSCULAR | Status: DC
  Administered 2024-02-26 – 2024-02-29 (×9): 3 [IU] via SUBCUTANEOUS

## 2024-02-26 NOTE — Progress Notes (Signed)
 Structural Heart Team:   See full consult note.   Given LV thrombus, will delay TAVR for now. We will follow with you.  Antoinette Batman, MD, [facility] 02/26/2024 5:06 PM

## 2024-02-26 NOTE — Progress Notes (Signed)
 Physical Therapy Treatment Patient Details Name: Mark Meadows MRN: [id number] DOB: 07-18-1951 Today's Date: 02/26/2024   History of Present Illness 73 y.o. male presents to [facility] 02/15/24 as transfer from [facility] ED for evaluation of STEMI, stent placed. Complicated by cardiogenic shock. PMHx: HTN, gout, thrombocytopenia, anemia, dysrhythmia, CKD stage 3a.  Heart Cath procedure 02/22/24.    PT Comments  Pt resting in recliner finishing up with OT at bedside and agreeable to mobilize with therapist. Pt reliant on Ue's for safe power up and reach back with transfers, no LOB noted and sup/CGA for safety only. Pt ambulated ~ 240' with IV pole for support and short bout in room with no UE support, no LOB noted throughout and VSS on RA. Pt denied fatigue and reports has assist from tenants at home. Will continue to progress pt as able in acute setting. Recommend HHPT follow up.    If plan is discharge home, recommend the following: Assistance with cooking/housework;Assist for transportation;Help with stairs or ramp for entrance;A little help with walking and/or transfers;A little help with bathing/dressing/bathroom   Can travel by private vehicle     Yes  Equipment Recommendations  None recommended by PT    Recommendations for Other Services       Precautions / Restrictions Precautions Precautions: Fall Recall of Precautions/Restrictions: Intact Precaution/Restrictions Comments: watch O2 Restrictions Weight Bearing Restrictions Per Provider Order: No     Mobility  Bed Mobility               General bed mobility comments: OOB in recliner    Transfers Overall transfer level: Needs assistance Equipment used: Rolling walker (2 wheels), None Transfers: Sit to/from Stand Sit to Stand: Contact guard assist, Supervision           General transfer comment: pt requires use of hand to power up and return to sit    Ambulation/Gait Ambulation/Gait assistance: Contact guard assist,  Supervision Gait Distance (Feet): 240 Feet Assistive device: IV Pole, None Gait Pattern/deviations: Step-through pattern, Decreased stance time - right, Decreased stride length, Decreased weight shift to right, Antalgic, Trunk flexed Gait velocity: decr     General Gait Details: overall steady, mild sway but no overt LOB. pt manaing IV pole with cues from therapist. able to amb short bout in room with no AD.   Stairs             Wheelchair Mobility     Tilt Bed    Modified Rankin (Stroke Patients Only)       Balance Overall balance assessment: Needs assistance Sitting-balance support: No upper extremity supported, Feet supported Sitting balance-Leahy Scale: Good     Standing balance support: Reliant on assistive device for balance Standing balance-Leahy Scale: Poor Standing balance comment: reliant on RW and UE support                            Communication Communication Communication: No apparent difficulties Factors Affecting Communication: Hearing impaired  Cognition Arousal: Alert Behavior During Therapy: WFL for tasks assessed/performed   PT - Cognitive impairments: No apparent impairments                         Following commands: Intact      Cueing Cueing Techniques: Verbal cues  Exercises      General Comments        Pertinent Vitals/Pain Pain Assessment Pain Assessment: No/denies pain    Home  Living                          Prior Function            PT Goals (current goals can now be found in the care plan section) Acute Rehab PT Goals Patient Stated Goal: to have less pain when moving PT Goal Formulation: With patient Time For Goal Achievement: 03/02/24 Potential to Achieve Goals: Good Progress towards PT goals: Progressing toward goals    Frequency    Min 2X/week      PT Plan      Co-evaluation              AM-PAC PT "6 Clicks" Mobility   Outcome Measure  Help needed  turning from your back to your side while in a flat bed without using bedrails?: A Little Help needed moving from lying on your back to sitting on the side of a flat bed without using bedrails?: A Little Help needed moving to and from a bed to a chair (including a wheelchair)?: A Little Help needed standing up from a chair using your arms (e.g., wheelchair or bedside chair)?: A Little Help needed to walk in hospital room?: A Little Help needed climbing 3-5 steps with a railing? : A Little 6 Click Score: 18    End of Session Equipment Utilized During Treatment: Gait belt Activity Tolerance: No increased pain;Patient tolerated treatment well Patient left: with call bell/phone within reach;in chair Nurse Communication: Mobility status PT Visit Diagnosis: Other abnormalities of gait and mobility (R26.89);Unsteadiness on feet (R26.81)     Time: 4742-5956 PT Time Calculation (min) (ACUTE ONLY): 21 min  Charges:    $Gait Training: 8-22 mins PT General Charges $$ ACUTE PT VISIT: 1 Visit                     Tish Forge, DPT Acute Rehabilitation Services Office [phone]  02/26/24 4:52 PM

## 2024-02-26 NOTE — Progress Notes (Signed)
 PHARMACY - ANTICOAGULATION CONSULT NOTE  Pharmacy Consult for heparin  Indication:  LV thrombus  No Known Allergies  Patient Measurements: Height: 5\' 8"  (172.7 cm) Weight: 96.4 kg (212 lb 8.4 oz) IBW/kg (Calculated) : 68.4 HEPARIN  DW (KG): 90.4  Vital Signs: Temp: 98.1 F (36.7 C) (06/06 1552) Temp Source: Oral (06/06 1552) BP: 119/78 (06/06 1552) Pulse Rate: 93 (06/06 1552)  Labs: Recent Labs    02/24/24 0500 02/25/24 0440 02/26/24 0357 02/26/24 1851  HGB 8.4* 8.3*  --   --   HCT 28.0* 28.6*  --   --   PLT 312 366  --   --   APTT  --   --   --  43*  HEPARINUNFRC  --   --   --  >1.10*  CREATININE 1.47* 1.56* 2.00*  --     Estimated Creatinine Clearance: 36.5 mL/min (A) (by C-G formula based on SCr of 2 mg/dL (H)).   Medical History: Past Medical History:  Diagnosis Date   Acute blood loss anemia    Alcohol dependence (HCC)    Alcoholic hepatitis    Dysrhythmia    a. 08/2016 -?afib, not clear. b. 09/2017 - official dx atrial flutter.   E. coli UTI 09/22/2016   GERD (gastroesophageal reflux disease)    Gout    Hematochezia    Hyperglycemia    Hypertension    Hypokalemia    Hypothyroidism    Thrombocytopenia (HCC)      Assessment: 73 yo male admitted with STEMI, found with LV thrombus and started on Eliquis .  However, now requiring TAVR next Tuesday and pharmacy asked to switch to IV heparin  in the meantime. Last dose apixaban  6/5 PM.   Heparin  level >1.1 is not correlating with aPTT 43, which is subtherapeutic on 1000 units/hr.    Goal of Therapy:  Heparin  level 0.3-0.7 units/ml aPTT 66-102 seconds Monitor platelets by anticoagulation protocol: Yes   Plan:  Increase heparin  to 1150 units/hr   F/u aPTT until correlates with heparin  level  Monitor daily aPTT, heparin  level, CBC, signs/symptoms of bleeding   Dorene Gang, PharmD, BCPS, BCCP Clinical Pharmacist  Please check AMION for all [facility] Pharmacy phone numbers After 10:00 PM, call Main Pharmacy  [phone]

## 2024-02-26 NOTE — Inpatient Diabetes Management (Signed)
 Inpatient Diabetes Program Recommendations  AACE/ADA: New Consensus Statement on Inpatient Glycemic Control (2015)  Target Ranges:  Prepandial:   less than 140 mg/dL      Peak postprandial:   less than 180 mg/dL (1-2 hours)      Critically ill patients:  140 - 180 mg/dL   Lab Results  Component Value Date   GLUCAP 337 (H) 02/26/2024   HGBA1C 6.4 (H) 02/15/2024    Latest Reference Range & Units 02/25/24 06:00 02/25/24 12:04 02/25/24 17:48 02/25/24 21:01 02/26/24 06:19  Glucose-Capillary 70 - 99 mg/dL 161 (H) 096 (H) 045 (H) 318 (H) 337 (H)  (H): Data is abnormally high  Diabetes history: Pre-DM, DM 2 Outpatient Diabetes medications: None (was prescribed Metformin in the past but never took it) Current orders for Inpatient glycemic control:  Novolog  0-15 units tid + hs Prednisone 40 mg daily   Inpatient Diabetes Program Recommendations:  CBG this am 337.  While on steroids, please consider: -Add Novolog  3 units tid meal coverage if eats 50%  Thank you, Kadarius Cuffe E. Veasna Santibanez, RN, MSN, CDCES  Diabetes Coordinator Inpatient Glycemic Control Team Team Pager [phone] (8am-5pm) 02/26/2024 8:56 AM

## 2024-02-26 NOTE — Plan of Care (Signed)
   Problem: Education: Goal: Knowledge of General Education information will improve Description Including pain rating scale, medication(s)/side effects and non-pharmacologic comfort measures Outcome: Progressing

## 2024-02-26 NOTE — Progress Notes (Signed)
 Occupational Therapy Treatment Patient Details Name: Mark Meadows MRN: [id number] DOB: 06/25/1951 Today's Date: 02/26/2024   History of present illness 73 y.o. male presents to [facility] 02/15/24 as transfer from [facility] ED for evaluation of STEMI, stent placed. Complicated by cardiogenic shock. PMHx: HTN, gout, thrombocytopenia, anemia, dysrhythmia, CKD stage 3a.  Heart Cath procedure 02/22/24.   OT comments  Pt. Seen for skilled OT treatment. Initially hesitant to participate but able to explain why and find a common ground for participation.  Pt. Has his ways of mobility and completing ADLs and explains wanting the ability to demonstrate those ways prior to additional instruction or guidance, Wanting to know he will be able to go home and resume his previous activities.  Listened while pt. Expressed feelings then pt. Allowed me to review purpose and goals of therapy both PT/OT operating the same in conjunction with his goals to resume safely to prior activities and mobility.  Pt. Was receptive and agreeable to participation.  Able to demonstrate LB dressing seated with set up.  Reports ambulating with staff to/from b.room for toileting and standing grooming tasks with no issue. Pt. Reports he does have a roommate that lives with him. Per this report and observed LB dressing pt. Progressing well with acute OT goals.  Note pending procedure early next week. Will have OT check in for any additional needs but should be approaching d/c from acute OT.        If plan is discharge home, recommend the following:      Equipment Recommendations       Recommendations for Other Services      Precautions / Restrictions Precautions Precautions: Fall Precaution/Restrictions Comments: watch O2       Mobility Bed Mobility                    Transfers                         Balance                                           ADL either performed or assessed with clinical  judgement   ADL Overall ADL's : Needs assistance/impaired       Grooming Details (indicate cue type and reason): pt. reports completing standing hand washing at sink after use of the restroom with staff assist             Lower Body Dressing: Supervision/safety;Sitting/lateral leans Lower Body Dressing Details (indicate cue type and reason): able to figure 4 from seated position for management of socks. reports he never wears socks at home and has slip on shoes   Toilet Transfer Details (indicate cue type and reason): pt. reports he has been amb. with staff to/from b.room without issue           General ADL Comments: reports he has ways of doing things and he wants the chance to show those ways prior to new or additional instruction.  reviewed benefits of having that communication when therapists and other staff are working with him to allow the chance for him to demo better how he will be moving at home    Extremity/Trunk Assessment              Vision       Perception     Praxis  Communication     Cognition Arousal: Alert Behavior During Therapy: WFL for tasks assessed/performed Cognition: No apparent impairments                               Following commands: Intact        Cueing   Cueing Techniques: Verbal cues  Exercises      Shoulder Instructions       General Comments      Pertinent Vitals/ Pain       Pain Assessment Pain Assessment: No/denies pain  Home Living  Has a roommate with 2 dtrs that also live with him.  Pt. Has 7 dogs/cats and the roommate has approx. 17 more that also live there.  Pt. 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 Pt. Is very passionate about this and can not wait to get back to his pets                                         Prior Functioning/Environment              Frequency  Min 2X/week        Progress Toward  Goals  OT Goals(current goals can now be found in the care plan section)  Progress towards OT goals: Progressing toward goals     Plan      Co-evaluation                 AM-PAC OT "6 Clicks" Daily Activity     Outcome Measure   Help from another person eating meals?: None Help from another person taking care of personal grooming?: None Help from another person toileting, which includes using toliet, bedpan, or urinal?: A Little Help from another person bathing (including washing, rinsing, drying)?: A Little Help from another person to put on and taking off regular upper body clothing?: None Help from another person to put on and taking off regular lower body clothing?: A Little 6 Click Score: 21    End of Session    OT Visit Diagnosis: Unsteadiness on feet (R26.81);Muscle weakness (generalized) (M62.81);Pain Pain - Right/Left: Left Pain - part of body: Knee;Ankle and joints of foot   Activity Tolerance Patient tolerated treatment well   Patient Left in chair;with call bell/phone within reach;Other (comment) (PT present ready to begin session)   Nurse Communication Other (comment) (rn states ok to work with pt, reviewed pts feelings about being allowed to try mobility his way then recieve guidence)        Time: 1334-1400 OT Time Calculation (min): 26 min  Charges: OT General Charges $OT Visit: 1 Visit OT Treatments $Self Care/Home Management : 23-37 mins  Howell Macintosh, COTA/L Acute Rehabilitation [phone]   Leory Rands Lorraine-COTA/L  02/26/2024, 3:35 PM

## 2024-02-26 NOTE — Progress Notes (Signed)
 Patient ID: Mark Meadows, male   DOB: [birth year], 73 y.o.   MRN: [id number]     Advanced Heart Failure Rounding Note  Cardiologist: Peder Bourdon, MD  Chief Complaint: acute systolic heart failure, LFLG AS   Patient Profile   Mr Sanger is a 73 year old with a history of HTN, HLD, and tobacco abuse. Admitted with late presenting anterior STEMI c/b cardiogenic shock. Course further c/b LFLG AS.   Subjective:   5/26 Late presenting anterolateral STEMI s/p PCI/DES to LAD 5/27 CO-OX 29%. Started on 0.25 milrinone . 5/30 Milrinone  stopped 06/02 RHC - elevated filling pressures, preserved CO by Fick, low by TD, moderate pulmonary venous hypertension  No co-ox this morning. cMRI read pending.   Scr 1.56>2.   CVP 9 with poor waveform. SBP 100-110s.   Feels fine this morning. Denies CP/SOB.    Objective:   Weight Range: 96.4 kg Body mass index is 32.31 kg/m.   Vital Signs:   Temp:  [97.4 F (36.3 C)-97.9 F (36.6 C)] 97.6 F (36.4 C) (06/06 0756) Pulse Rate:  [81-99] 99 (06/06 0756) Resp:  [15-20] 19 (06/06 0756) BP: (101-124)/(72-86) 113/86 (06/06 0756) SpO2:  [92 %-100 %] 100 % (06/06 0334) Weight:  [96.4 kg-97 kg] 96.4 kg (06/06 0632) Last BM Date : 02/24/24  Weight change: Filed Weights   02/25/24 0447 02/26/24 0457 02/26/24 4098  Weight: 97.1 kg 97 kg 96.4 kg    Intake/Output:   Intake/Output Summary (Last 24 hours) at 02/26/2024 0941 Last data filed at 02/26/2024 0600 Gross per 24 hour  Intake 840 ml  Output 1935 ml  Net -1095 ml      Physical Exam  CVP 9? General:  elderly appearing.  No respiratory difficulty Neck: supple. JVD flat.  Cor: PMI nondisplaced. Regular rate & rhythm. No rubs, gallops. 3/6 AS murmur Lungs: clear Extremities: no cyanosis, clubbing, rash, edema  Neuro: alert & oriented x 3. Moves all 4 extremities w/o difficulty. Affect pleasant.   Telemetry   AFib 70s-90s  personally reviewed   EKG   N/A  Labs    CBC Recent Labs     02/24/24 0500 02/25/24 0440  WBC 10.5 13.8*  HGB 8.4* 8.3*  HCT 28.0* 28.6*  MCV 80.7 80.6  PLT 312 366   Basic Metabolic Panel Recent Labs    11/91/47 0440 02/26/24 0357  NA 134* 131*  K 4.7 4.8  CL 104 100  CO2 23 22  GLUCOSE 203* 283*  BUN 43* 47*  CREATININE 1.56* 2.00*  CALCIUM  9.0 8.7*     Other results:   Imaging   - No new imaging   Medications:     Scheduled Medications:  amiodarone  200 mg Oral BID   Chlorhexidine  Gluconate Cloth  6 each Topical Daily   clopidogrel   75 mg Oral Q breakfast   digoxin   0.125 mg Oral Daily   folic acid   1 mg Oral Daily   insulin  aspart  0-15 Units Subcutaneous TID WC   insulin  aspart  0-5 Units Subcutaneous QHS   levothyroxine   150 mcg Oral Q0600   And   levothyroxine   150 mcg Oral Once per day on Wednesday Saturday   multivitamin with minerals  1 tablet Oral Daily   pantoprazole   40 mg Oral Daily   polyethylene glycol  17 g Oral Daily   predniSONE  40 mg Oral Q breakfast   rosuvastatin   40 mg Oral Daily   senna-docusate  1 tablet Oral Daily  sodium chloride  flush  10-40 mL Intracatheter Q12H   sodium chloride  flush  10-40 mL Intracatheter Q12H   sodium chloride  flush  3 mL Intravenous Q12H   spironolactone   12.5 mg Oral Daily   thiamine   100 mg Oral Daily    Infusions:  heparin        PRN Medications: acetaminophen , diazepam , methocarbamol , nitroGLYCERIN , mouth rinse, sodium chloride  flush, sodium chloride  flush, sodium chloride  flush      Assessment/Plan  1. Anterior STEMI with CAD -HS Trop >24K. Cath with 100% p to m LAD treated with PTCA/DES. 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  -Stopped aspirin . Continue plavix  alone with need for anticoagulation. -Continue crestor  40 mg daily  -Stable w/o CP    2. Cardiogenic Shock: Acute HFrEF/ischemic cardiomyopathy.  - Echo EF 30% on admit.  - Echo 5/27 EF 15-20%, RV normal with severe AS.  - RHC 02/22/24: Elevated right and left sided filling  pressures, moderate pulmonary venous hypertension, Fick CI 2.4, TD CI 1.97 - Co-ox pending.  - Continue digoxin  0.125 - Continue spiro 12.5 mg daily - Avoiding aggressive GDMT titration given soft BP and severe AS  - CVP 9 (poor waveform), weight down 1lb. Hold torsemide today with rise in SCr.     3. Aortic valve stenosis - Echo 05/27: severely calcified aortic valve with restricted leaflets, AVA 0.81 cm2 an, MG 16 and DI 0.24, concerning for low flow low gradient AS. -Discussed with structural team this admission. Plan for inpatient TAVR evaluation. Getting scans done. cMRI read pending. If thrombus still present will defer TAVR for a couple or weeks.    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 CKD Stage IIIa -Unclear baseline. Scr 1.6-1.8 this admit. -SCr up to 2 today. Hold Torsemide.  - follow BMP    6. Hypothyroid -TSH 6.  -On Levothyroxine .   7. 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   8. Gout flare - Involving left ankle and great toe - Started colchicine  05/30. Some improvement in pain but still bothersome. Completed prednisone burst.   - now off colchicine .  9. Atrial fibrillation - Remains in A fib rate 80s . - Continue amiodarone 200 BID - Continue Eliquis  - Would hold off on DCCV until after TAVR   Length of Stay: 11  Sheryl Donna, NP  02/26/2024, 9:41 AM  Advanced Heart Failure Team Pager [phone] (M-F; 7a - 5p)  Please contact [facility] for night-coverage after hours (5p -7a ) and weekends on [email or web address]

## 2024-02-26 NOTE — TOC Progression Note (Signed)
 Transition of Care [facility]) - Progression Note    Patient Details  Name: Mark Meadows MRN: [id number] Date of Birth: 10-Sep-1951  Transition of [facility]) CM/SW Contact  Ernst Heap Phone Number: [phone] 02/26/2024, 2:33 PM  Clinical Narrative:  HF CSW called and scheduled patients Hospital follow up appointment for Monday, March 07, 2024 at 1:00PM.    TOC will continue following.     Expected Discharge Plan: Home w Home Health Services Barriers to Discharge: Continued Medical Work up  Expected Discharge Plan and Services   Discharge Planning Services: CM Consult Post Acute Care Choice: Home Health Living arrangements for the past 2 months: Single Family Home                           HH Arranged: PT HH Agency: [facility] Date [facility] Agency Contacted: 02/23/24 Time HH Agency Contacted: 1534 Representative spoke with at [facility] Agency: Marcina Severe   Social Determinants of Health (SDOH) Interventions SDOH Screenings   Food Insecurity: No Food Insecurity (02/15/2024)  Housing: Low Risk  (02/15/2024)  Transportation Needs: No Transportation Needs (02/15/2024)  Utilities: Not At Risk (02/15/2024)  Financial Resource Strain: Low Risk  (12/17/2022)   Received from [facility]  Physical Activity: Inactive (02/07/2022)   Received from [facility], [facility]  Social Connections: Moderately Isolated (02/15/2024)  Stress: No Stress Concern Present (02/07/2022)   Received from [facility], [facility]  Tobacco Use: Medium Risk (02/25/2024)    Readmission Risk Interventions     No data to display

## 2024-02-26 NOTE — Progress Notes (Signed)
 Mobility Specialist Progress Note:   02/26/24 1015  Mobility  Activity Ambulated with assistance to bathroom  Level of Assistance Independent after set-up  Assistive Device None  Distance Ambulated (ft) 12 ft  Activity Response Tolerated well  Mobility Referral Yes  Mobility visit 1 Mobility  Mobility Specialist Start Time (ACUTE ONLY) 1009  Mobility Specialist Stop Time (ACUTE ONLY) 1015  Mobility Specialist Time Calculation (min) (ACUTE ONLY) 6 min   Pt received in bed, requesting assistance to ambulate to bathroom. Tolerated well, asx throughout. Left in bathroom, encouraged to use call bell when finished.   Mark Meadows Mobility Specialist Please contact via Special educational needs teacher or  Rehab office at [phone]

## 2024-02-27 LAB — BASIC METABOLIC PANEL WITH GFR
Anion gap: 10 (ref 5–15)
BUN: 44 mg/dL — ABNORMAL HIGH (ref 8–23)
CO2: 19 mmol/L — ABNORMAL LOW (ref 22–32)
Calcium: 8.3 mg/dL — ABNORMAL LOW (ref 8.9–10.3)
Chloride: 102 mmol/L (ref 98–111)
Creatinine, Ser: 1.84 mg/dL — ABNORMAL HIGH (ref 0.61–1.24)
GFR, Estimated: 38 mL/min — ABNORMAL LOW (ref >60)
Glucose, Bld: 323 mg/dL — ABNORMAL HIGH (ref 70–99)
Potassium: 4.6 mmol/L (ref 3.5–5.1)
Sodium: 131 mmol/L — ABNORMAL LOW (ref 135–145)

## 2024-02-27 LAB — APTT
aPTT: 58 s — ABNORMAL HIGH (ref 24–36)
aPTT: 80 s — ABNORMAL HIGH (ref 24–36)
aPTT: 55 s — ABNORMAL HIGH (ref 24–36)

## 2024-02-27 LAB — GLUCOSE, CAPILLARY
Glucose-Capillary: 211 mg/dL — ABNORMAL HIGH (ref 70–99)
Glucose-Capillary: 221 mg/dL — ABNORMAL HIGH (ref 70–99)
Glucose-Capillary: 160 mg/dL — ABNORMAL HIGH (ref 70–99)
Glucose-Capillary: 135 mg/dL — ABNORMAL HIGH (ref 70–99)
Glucose-Capillary: 206 mg/dL — ABNORMAL HIGH (ref 70–99)

## 2024-02-27 LAB — HEPARIN LEVEL (UNFRACTIONATED): Heparin Unfractionated: >1.1 [IU]/mL — ABNORMAL HIGH (ref 0.30–0.70)

## 2024-02-27 MED ORDER — GUAIFENESIN-DM 100-10 MG/5ML PO SYRP
5.0000 mL | ORAL_SOLUTION | ORAL | Status: DC | PRN
  Administered 2024-02-27 – 2024-02-29 (×3): 5 mL via ORAL
  Filled 2024-02-27 (×3): qty 5

## 2024-02-27 NOTE — Progress Notes (Signed)
 PHARMACY - ANTICOAGULATION CONSULT NOTE  Pharmacy Consult for heparin  Indication:  LV thrombus  No Known Allergies  Patient Measurements: Height: 5\' 8"  (172.7 cm) Weight: 96.4 kg (212 lb 8.4 oz) IBW/kg (Calculated) : 68.4 HEPARIN  DW (KG): 90.4  Vital Signs: Temp: 97.6 F (36.4 C) (06/07 0324) Temp Source: Oral (06/07 0324) BP: 95/75 (06/07 0324) Pulse Rate: 92 (06/07 0324)  Labs: Recent Labs    02/24/24 0500 02/25/24 0440 02/26/24 0357 02/26/24 1851 02/27/24 0301  HGB 8.4* 8.3*  --   --   --   HCT 28.0* 28.6*  --   --   --   PLT 312 366  --   --   --   APTT  --   --   --  43* 58*  HEPARINUNFRC  --   --   --  >1.10* >1.10*  CREATININE 1.47* 1.56* 2.00*  --  1.84*    Estimated Creatinine Clearance: 39.7 mL/min (A) (by C-G formula based on SCr of 1.84 mg/dL (H)).  Assessment: 73 yo male admitted with STEMI, found with LV thrombus and started on Eliquis .  However, now requiring TAVR next Tuesday and pharmacy asked to switch to IV heparin  in the meantime. Last dose apixaban  6/5 PM.   Heparin  level >1.1 is not correlating with aPTT 58, which is subtherapeutic on 1150 units/hr.  Per RN, no signs/symptoms of bleeding or issues with heparin  gtt running continuously   Goal of Therapy:  Heparin  level 0.3-0.7 units/ml aPTT 66-102 seconds Monitor platelets by anticoagulation protocol: Yes   Plan:  Increase heparin  to 1300 units/hr   8h aPTT F/u aPTT until correlates with heparin  level  Monitor daily aPTT, heparin  level, CBC, signs/symptoms of bleeding   Young Hensen, PharmD, BCPS Clinical Pharmacist 02/27/2024 4:45 AM

## 2024-02-27 NOTE — Progress Notes (Signed)
  Progress Note  Patient Name: Mark Meadows Date of Encounter: 02/27/2024 [facility] Cardiologist: Peder Bourdon, MD    Interval Summary   73 year old gentleman with a history of hypertension, hyperlipidemia, tobacco use.  He was a late presenting anterior ST segment elevation myocardial infarction.  He had cardiogenic shock.  He is also been found to have low-flow low gradient aortic stenosis.  Cardiac MRI reveals normal-sized LV with mild LVH.  LVEF is 32%. There is severe aortic stenosis and mild aortic insufficiency. Moderate mitral regurgitation with a regurgitant fraction of 30%  Delayed enhancement pattern suggest dense LAD territory myocardial infarction.  The infarcted segments appear to be nonviable.  Small LV apical thrombus      Vital Signs Vitals:   02/27/24 0637 02/27/24 0814 02/27/24 0829 02/27/24 1241  BP:  (!) 96/59 100/73 110/71  Pulse:  74 78 89  Resp:  (!) 27 16   Temp:  97.6 F (36.4 C)  97.7 F (36.5 C)  TempSrc:  Oral  Oral  SpO2:  99% 100% 96%  Weight: 96.2 kg     Height:        Intake/Output Summary (Last 24 hours) at 02/27/2024 1456 Last data filed at 02/27/2024 1000 Gross per 24 hour  Intake 1082.03 ml  Output 670 ml  Net 412.03 ml      02/27/2024    6:37 AM 02/26/2024    6:32 AM 02/26/2024    4:57 AM  Last 3 Weights  Weight (lbs) 212 lb 1.3 oz 212 lb 8.4 oz 213 lb 13.5 oz  Weight (kg) 96.2 kg 96.4 kg 97 kg      Telemetry/ECG  SR  - Personally Reviewed  Physical Exam  GEN: No acute distress.   Neck: No JVD Cardiac: RRR, no murmurs, rubs, or gallops.  Respiratory: Clear to auscultation bilaterally. GI: Soft, nontender, non-distended  MS: No edema  Assessment & Plan    CAD :   has extensive infarction of the LAD,  had stenting but the MRI suggests that the infarction territory is likely non viable .   Continue current care    2.  AS :  has an LV apical thrombus .    TAVR work up has been delayed for now Continue  heparin              For questions or updates, please contact  [facility] Please consult [email or web address] for contact info under       Signed, Ahmad Alert, MD

## 2024-02-27 NOTE — Progress Notes (Signed)
 Mobility Specialist Progress Note;   02/27/24 1133  Mobility  Activity Ambulated with assistance in hallway  Level of Assistance Standby assist, set-up cues, supervision of patient - no hands on  Assistive Device Other (Comment) (IV pole)  Distance Ambulated (ft) 200 ft  Activity Response Tolerated well  Mobility Referral Yes  Mobility visit 1 Mobility  Mobility Specialist Start Time (ACUTE ONLY) 1133  Mobility Specialist Stop Time (ACUTE ONLY) 1142  Mobility Specialist Time Calculation (min) (ACUTE ONLY) 9 min   Pt agreeable to mobility, in chair upon arrival. Required no physical assistance during ambulation, SV. VSS throughout. Only c/o R knee pain during ambulation. Pt returned back to chair with all needs met, call bell in reach.   Janit Meline Mobility Specialist Please contact via SecureChat or [employer] [phone]

## 2024-02-27 NOTE — Progress Notes (Signed)
 PHARMACY - ANTICOAGULATION CONSULT NOTE  Pharmacy Consult for heparin  Indication:  LV thrombus  No Known Allergies  Patient Measurements: Height: 5\' 8"  (172.7 cm) Weight: 96.2 kg (212 lb 1.3 oz) IBW/kg (Calculated) : 68.4 HEPARIN  DW (KG): 90.4  Vital Signs: Temp: 98 F (36.7 C) (06/07 1948) Temp Source: Oral (06/07 1948) BP: 92/68 (06/07 1948) Pulse Rate: 80 (06/07 1719)  Labs: Recent Labs    02/25/24 0440 02/26/24 0357 02/26/24 1851 02/26/24 1851 02/27/24 0301 02/27/24 1300 02/27/24 2124  HGB 8.3*  --   --   --   --   --   --   HCT 28.6*  --   --   --   --   --   --   PLT 366  --   --   --   --   --   --   APTT  --   --  43*   < > 58* 80* 55*  HEPARINUNFRC  --   --  >1.10*  --  >1.10*  --   --   CREATININE 1.56* 2.00*  --   --  1.84*  --   --    < > = values in this interval not displayed.    Estimated Creatinine Clearance: 39.6 mL/min (A) (by C-G formula based on SCr of 1.84 mg/dL (H)).  Assessment: 73 yo male admitted with STEMI, found with LV thrombus and started on Eliquis .  However, now requiring TAVR next Tuesday and pharmacy asked to switch to IV heparin  in the meantime. Last dose apixaban  6/5 PM.   - aPTT returned at 66, subtherapeutic.     Goal of Therapy:  Heparin  level 0.3-0.7 units/ml aPTT 66-102 seconds Monitor platelets by anticoagulation protocol: Yes   Plan:  -Increase heparin  to 1500 units/hr -aPTT in 8 hrs  Baxter Limber, PharmD Clinical Pharmacist **Pharmacist phone directory can now be found on [email or web address] ([email or web address]).  Listed under [facility].  e

## 2024-02-27 NOTE — Progress Notes (Signed)
 PHARMACY - ANTICOAGULATION CONSULT NOTE  Pharmacy Consult for heparin  Indication:  LV thrombus  No Known Allergies  Patient Measurements: Height: 5\' 8"  (172.7 cm) Weight: 96.2 kg (212 lb 1.3 oz) IBW/kg (Calculated) : 68.4 HEPARIN  DW (KG): 90.4  Vital Signs: Temp: 97.6 F (36.4 C) (06/07 0814) Temp Source: Oral (06/07 0814) BP: 100/73 (06/07 0829) Pulse Rate: 78 (06/07 0829)  Labs: Recent Labs    02/25/24 0440 02/26/24 0357 02/26/24 1851 02/27/24 0301  HGB 8.3*  --   --   --   HCT 28.6*  --   --   --   PLT 366  --   --   --   APTT  --   --  43* 58*  HEPARINUNFRC  --   --  >1.10* >1.10*  CREATININE 1.56* 2.00*  --  1.84*    Estimated Creatinine Clearance: 39.6 mL/min (A) (by C-G formula based on SCr of 1.84 mg/dL (H)).  Assessment: 73 yo male admitted with STEMI, found with LV thrombus and started on Eliquis .  However, now requiring TAVR next Tuesday and pharmacy asked to switch to IV heparin  in the meantime. Last dose apixaban  6/5 PM.   6/7 AM update: aPTT returned at 80, therapeutic.  Per RN, no signs/symptoms of bleeding or issues with heparin  gtt running continuously. Nursing team to message pharmacy with any concerns.    Goal of Therapy:  Heparin  level 0.3-0.7 units/ml aPTT 66-102 seconds Monitor platelets by anticoagulation protocol: Yes   Plan:  Continue heparin  1300 units/hr   Measure aPTT in 8 hours  F/u aPTT until correlates with heparin  level  Monitor daily aPTT, heparin  level, CBC, signs/symptoms of bleeding   Leonora Ramus, PharmD, BCPS PGY2 Pharmacy Resident

## 2024-02-27 NOTE — Plan of Care (Signed)
   Problem: Education: Goal: Knowledge of General Education information will improve Description Including pain rating scale, medication(s)/side effects and non-pharmacologic comfort measures Outcome: Progressing

## 2024-02-28 DIAGNOSIS — I5023 Acute on chronic systolic (congestive) heart failure: Secondary | ICD-10-CM

## 2024-02-28 LAB — GLUCOSE, CAPILLARY
Glucose-Capillary: 147 mg/dL — ABNORMAL HIGH (ref 70–99)
Glucose-Capillary: 218 mg/dL — ABNORMAL HIGH (ref 70–99)
Glucose-Capillary: 155 mg/dL — ABNORMAL HIGH (ref 70–99)
Glucose-Capillary: 174 mg/dL — ABNORMAL HIGH (ref 70–99)

## 2024-02-28 LAB — APTT: aPTT: 61 s — ABNORMAL HIGH (ref 24–36)

## 2024-02-28 LAB — HEPARIN LEVEL (UNFRACTIONATED): Heparin Unfractionated: >1.1 [IU]/mL — ABNORMAL HIGH (ref 0.30–0.70)

## 2024-02-28 MED ORDER — SODIUM CHLORIDE 0.9% FLUSH
3.0000 mL | Freq: Two times a day (BID) | INTRAVENOUS | Status: DC
  Administered 2024-02-28 – 2024-02-29 (×2): 3 mL via INTRAVENOUS

## 2024-02-28 MED ORDER — PANTOPRAZOLE SODIUM 40 MG PO TBEC
40.0000 mg | DELAYED_RELEASE_TABLET | Freq: Every day | ORAL | Status: DC
  Administered 2024-02-28 – 2024-02-29 (×2): 40 mg via ORAL
  Filled 2024-02-28 (×2): qty 1

## 2024-02-28 MED ORDER — INSULIN ASPART 100 UNIT/ML IJ SOLN: 0.0000 [IU] | Freq: Every day | INTRAMUSCULAR | Status: DC

## 2024-02-28 MED ORDER — INSULIN ASPART 100 UNIT/ML IJ SOLN
0.0000 [IU] | Freq: Three times a day (TID) | INTRAMUSCULAR | Status: DC
  Administered 2024-02-28 – 2024-02-29 (×3): 3 [IU] via SUBCUTANEOUS

## 2024-02-28 MED ORDER — SODIUM CHLORIDE 0.9% FLUSH: 10.0000 mL | INTRAVENOUS | Status: DC | PRN

## 2024-02-28 MED ORDER — ROSUVASTATIN CALCIUM 20 MG PO TABS
40.0000 mg | ORAL_TABLET | Freq: Every day | ORAL | Status: DC
  Administered 2024-02-28 – 2024-02-29 (×2): 40 mg via ORAL
  Filled 2024-02-28 (×2): qty 2

## 2024-02-28 MED ORDER — CLOPIDOGREL BISULFATE 75 MG PO TABS
75.0000 mg | ORAL_TABLET | Freq: Every day | ORAL | Status: DC
  Administered 2024-02-29: 75 mg via ORAL
  Filled 2024-02-28: qty 1

## 2024-02-28 MED ORDER — ACETAMINOPHEN 325 MG PO TABS: 650.0000 mg | ORAL_TABLET | ORAL | Status: DC | PRN

## 2024-02-28 MED ORDER — SENNOSIDES-DOCUSATE SODIUM 8.6-50 MG PO TABS
1.0000 | ORAL_TABLET | Freq: Every day | ORAL | Status: DC
  Filled 2024-02-28 (×2): qty 1

## 2024-02-28 MED ORDER — CLOPIDOGREL BISULFATE 75 MG PO TABS: 75.0000 mg | ORAL_TABLET | Freq: Every day | ORAL | Status: DC

## 2024-02-28 MED ORDER — APIXABAN 5 MG PO TABS
5.0000 mg | ORAL_TABLET | Freq: Two times a day (BID) | ORAL | Status: DC
  Administered 2024-02-28 – 2024-02-29 (×2): 5 mg via ORAL
  Filled 2024-02-28 (×2): qty 1

## 2024-02-28 MED ORDER — POLYETHYLENE GLYCOL 3350 17 G PO PACK
17.0000 g | PACK | Freq: Every day | ORAL | Status: DC
  Filled 2024-02-28 (×2): qty 1

## 2024-02-28 MED ORDER — SPIRONOLACTONE 12.5 MG HALF TABLET
12.5000 mg | ORAL_TABLET | Freq: Every day | ORAL | Status: DC
  Administered 2024-02-28 – 2024-02-29 (×2): 12.5 mg via ORAL
  Filled 2024-02-28 (×2): qty 1

## 2024-02-28 MED ORDER — METHOCARBAMOL 500 MG PO TABS: 500.0000 mg | ORAL_TABLET | Freq: Three times a day (TID) | ORAL | Status: DC | PRN

## 2024-02-28 MED ORDER — DIAZEPAM 5 MG PO TABS: 5.0000 mg | ORAL_TABLET | Freq: Three times a day (TID) | ORAL | Status: DC | PRN

## 2024-02-28 MED ORDER — ORAL CARE MOUTH RINSE: 15.0000 mL | OROMUCOSAL | Status: DC | PRN

## 2024-02-28 MED ORDER — SODIUM CHLORIDE 0.9% FLUSH: 3.0000 mL | INTRAVENOUS | Status: DC | PRN

## 2024-02-28 MED ORDER — CHLORHEXIDINE GLUCONATE CLOTH 2 % EX PADS
6.0000 | MEDICATED_PAD | Freq: Every day | CUTANEOUS | Status: DC
  Administered 2024-02-28 – 2024-02-29 (×2): 6 via TOPICAL

## 2024-02-28 MED ORDER — NITROGLYCERIN 0.4 MG SL SUBL: 0.4000 mg | SUBLINGUAL_TABLET | SUBLINGUAL | Status: DC | PRN

## 2024-02-28 NOTE — Progress Notes (Signed)
 PHARMACY - ANTICOAGULATION CONSULT NOTE  Pharmacy Consult for heparin  Indication:  LV thrombus  No Known Allergies  Patient Measurements: Height: 5\' 8"  (172.7 cm) Weight: 96 kg (211 lb 10.3 oz) IBW/kg (Calculated) : 68.4 HEPARIN  DW (KG): 90.4  Vital Signs: Temp: 98.3 F (36.8 C) (06/08 0733) Temp Source: Oral (06/08 0733) BP: 107/62 (06/08 0733) Pulse Rate: 97 (06/08 0733)  Labs: Recent Labs    02/26/24 0357 02/26/24 1851 02/26/24 1851 02/27/24 0301 02/27/24 1300 02/27/24 2124  APTT  --  43*   < > 58* 80* 55*  HEPARINUNFRC  --  >1.10*  --  >1.10*  --   --   CREATININE 2.00*  --   --  1.84*  --   --    < > = values in this interval not displayed.    Estimated Creatinine Clearance: 39.6 mL/min (A) (by C-G formula based on SCr of 1.84 mg/dL (H)).  Assessment: 73 yo male admitted with STEMI, found with LV thrombus and started on Eliquis .  However, now requiring TAVR next Tuesday and pharmacy asked to switch to IV heparin  in the meantime. Last dose apixaban  6/5 PM.   6/8 AM: aPTT returned at 61, subtherapeutic. Heparin  level still elevated from recent DOAC exposure.  Last CBC on 6/5 showed stable platelets and hemoglobin. Wil plan to increase by ~2 units/kg/hr, no bolus    Goal of Therapy:  Heparin  level 0.3-0.7 units/ml aPTT 66-102 seconds Monitor platelets by anticoagulation protocol: Yes   Plan:  Increase heparin  to 1700 units/hr Measure aPTT in 8 hrs Discontinue aPTT once correlating with heparin  level  Monitor for signs and symptoms of bleeding    Leonora Ramus, PharmD, BCPS PGY2 Pharmacy Resident

## 2024-02-28 NOTE — Progress Notes (Addendum)
 Rounding Note   Patient Name: Mark Meadows Date of Encounter: 02/28/2024  [facility] Cardiologist: Peder Bourdon, MD   Subjective  Denies any CP, mild cough, breathing slightly worse  Scheduled Meds:  Chlorhexidine  Gluconate Cloth  6 each Topical Daily   digoxin   0.125 mg Oral Daily   folic acid   1 mg Oral Daily   insulin  aspart  0-15 Units Subcutaneous TID WC   insulin  aspart  0-5 Units Subcutaneous QHS   insulin  aspart  3 Units Subcutaneous TID WC   levothyroxine   150 mcg Oral Q0600   And   levothyroxine   150 mcg Oral Once per day on Wednesday Saturday   multivitamin with minerals  1 tablet Oral Daily   pantoprazole   40 mg Oral Daily   polyethylene glycol  17 g Oral Daily   rosuvastatin   40 mg Oral Daily   senna-docusate  1 tablet Oral Daily   sodium chloride  flush  3 mL Intravenous Q12H   spironolactone   12.5 mg Oral Daily   thiamine   100 mg Oral Daily   Continuous Infusions:  heparin  1,700 Units/hr (02/28/24 1034)   PRN Meds: acetaminophen , diazepam , guaiFENesin-dextromethorphan, methocarbamol , nitroGLYCERIN , mouth rinse, sodium chloride  flush, sodium chloride  flush   Vital Signs  Vitals:   02/27/24 2355 02/28/24 0521 02/28/24 0733 02/28/24 1136  BP: (!) 89/63 99/66 107/62 100/69  Pulse:   97   Resp: 20 20 20 20   Temp: 98.8 F (37.1 C) 98 F (36.7 C) 98.3 F (36.8 C) 97.7 F (36.5 C)  TempSrc: Oral Oral Oral Oral  SpO2: 100% 99% 97% 96%  Weight:  96 kg    Height:        Intake/Output Summary (Last 24 hours) at 02/28/2024 1321 Last data filed at 02/28/2024 0733 Gross per 24 hour  Intake 639.07 ml  Output 1530 ml  Net -890.93 ml      02/28/2024    5:21 AM 02/27/2024    6:37 AM 02/26/2024    6:32 AM  Last 3 Weights  Weight (lbs) 211 lb 10.3 oz 212 lb 1.3 oz 212 lb 8.4 oz  Weight (kg) 96 kg 96.2 kg 96.4 kg      Telemetry Atrial flutter without significant ventricular ectopy - Personally Reviewed  ECG  ?atrial flutter, rate controlled -  Personally Reviewed  Physical Exam  GEN: No acute distress.   Neck: No JVD Cardiac: irregular, no rubs, or gallops. 3/6 systolic murmur at RUSB Respiratory: Clear to auscultation bilaterally. GI: Soft, nontender, non-distended  MS: No edema; No deformity. Neuro:  Nonfocal  Psych: Normal affect   Labs High Sensitivity Troponin:   Recent Labs  Lab 02/15/24 1842 02/15/24 2139  TROPONINIHS >24,000* >24,000*     Chemistry Recent Labs  Lab 02/25/24 0440 02/26/24 0357 02/27/24 0301  NA 134* 131* 131*  K 4.7 4.8 4.6  CL 104 100 102  CO2 23 22 19*  GLUCOSE 203* 283* 323*  BUN 43* 47* 44*  CREATININE 1.56* 2.00* 1.84*  CALCIUM  9.0 8.7* 8.3*  GFRNONAA 46* 34* 38*  ANIONGAP 7 9 10     Lipids No results for input(s): "CHOL", "TRIG", "HDL", "LABVLDL", "LDLCALC", "CHOLHDL" in the last 168 hours.  Hematology Recent Labs  Lab 02/23/24 0500 02/24/24 0500 02/25/24 0440  WBC 11.6* 10.5 13.8*  RBC 3.58* 3.47* 3.55*  HGB 8.6* 8.4* 8.3*  HCT 29.7* 28.0* 28.6*  MCV 83.0 80.7 80.6  MCH 24.0* 24.2* 23.4*  MCHC 29.0* 30.0 29.0*  RDW 18.2* 18.1* 18.0*  PLT 339 312 366   Thyroid No results for input(s): "TSH", "FREET4" in the last 168 hours.  BNPNo results for input(s): "BNP", "PROBNP" in the last 168 hours.  DDimer No results for input(s): "DDIMER" in the last 168 hours.   Radiology  No results found.  Cardiac Studies  Cath 02/15/2024   Prox LAD to Mid LAD lesion is 100% stenosed.   A drug-eluting stent was successfully placed using a STENT SYNERGY XD 3.0X16.   Post intervention, there is a 0% residual stenosis.   LV end diastolic pressure is severely elevated.   The left ventricular ejection fraction is 25-35% by visual estimate.   Recommend uninterrupted dual antiplatelet therapy with Aspirin  81mg  daily and Clopidogrel  75mg  daily for a minimum of 12 months (ACS-Class I recommendation).   1.  Acute, late presenting STEMI involving the LAD, treated successfully with PCI  using PTCA followed by stenting of the proximal LAD with baseline TIMI 0 flow, restored to TIMI-3 flow at the completion of the procedure 2.  Mild nonobstructive plaquing in the left main, left circumflex, and RCA with a heavily calcified RCA 3.  Severe segmental LV dysfunction with akinesis of the entire anterolateral wall, apex, and inferoapex, LVEF estimated at approximately 25 to 30% 4.  Angiographic findings on ventriculography suspicious for LV apical thrombus 5.  Severely elevated LVEDP   Recommendations: DAPT with aspirin  and clopidogrel  minimum of 12 months, check 2D echo with contrast to evaluate for LV apical thrombus and accurately assess LVEF, post MI medical therapy, IV diuresis with furosemide  in the setting of high LVEDP (acute systolic heart failure secondary to acute anterior infarct).  Institute GDMT as tolerated.    Echo 02/16/2024 1. Akinesis of the mid septum into apex and inferior septum consistent  with LAD infarction. LVEF 15-20%. Cannot exclude a layered LV thrombus on  contrast imaging. Consider cardiac MRI for definitive evaluation. LVOT VTI  9 cm. CO estimated 1.8 L/min and  CI 0.8 L/min/m2, concerning for low output state. Left ventricular  ejection fraction, by estimation, is 15-20%. The left ventricle has  severely decreased function. The left ventricle demonstrates regional wall  motion abnormalities (see scoring  diagram/findings for description). There is moderate concentric left  ventricular hypertrophy. Indeterminate diastolic filling due to E-A  fusion.   2. Severely calcified aortic valve with restricted leaflet movement. Vmax  2.6 m/s, MG 16 mmHG, AVA 0.81 cm2, DI 0.24. This is concerning for low  flow low gradient severe aortic stenosis. Recommend clinical correlation.  The aortic valve is calcified.  Aortic valve regurgitation is trivial. Moderate to severe aortic valve  stenosis.   3. Right ventricular systolic function is normal. The right  ventricular  size is mildly enlarged. Tricuspid regurgitation signal is inadequate for  assessing PA pressure.   4. The mitral valve is degenerative. Mild mitral valve regurgitation. No  evidence of mitral stenosis.   5. The inferior vena cava is dilated in size with >50% respiratory  variability, suggesting right atrial pressure of 8 mmHg.     Cardiac MRI 02/25/2024 IMPRESSION: 1.  Airspace disease in lower lung fields as above.   2. Normal LV size with mild LV hypertrophy. Wall motion abnormalities as noted above in LAD infarction pattern. LV EF 32%.   3.  Normal RV size with RV EF 44%.   4. Thickened/calcified aortic valve, visually severe stenosis. Mild regurgitation with regurgitant fraction 19%.   5.  Moderate mitral regurgitation with regurgitant fraction 30%.   6.  Delayed enhancement pattern suggests dense LAD territory myocardial infarction. The infarcted segments are unlikely to be viable with revascularization.   7.  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   8. Mildly elevated extracellular volume percentage at 32% likely related to scar from anterior infarction.     Patient Profile   73 y.o. male who presented on 02/15/2024 with late presenting anterolateral STEMI. Cath showed 100% prox to mid LAD lesion treated with Synergy DES, EF 25-35%. Hospital course complicated by cardiogenic shock requiring milrinone . Subsequent echo showed akinesis of LAD wall with EF 15-20%, severe aortic stenosis.   Assessment & Plan   Anterolateral STEMI  - Cath 02/15/2024 showed 100% prox to mid LAD lesion treated with Synergy DES, EF 25-35%.   - cardiac MRI 02/25/2024 showed LVEF 32%, RVEF 44%, mild AI, severe AS, moderate MR, LAD territory infarction unlikely to be viable, suspect very small 5mm LV apical thrombus.   - ASA stopped, continue plavix . Likely will need Eliquis  going home due to apical thrombus, currently on IV heparin .   Acute systolic heart failure/ ischemic  cardiomyopathy  - EF 15-20% on echo. RHC showed CI 2.4  - euvolemic today.   - continue digoxin  and spironolactone . No BB given severe LV dysfunction.   - PT/OT. Obtain BMET tomorrow AM, consider add oral lasix  20mg  daily + SGLT2i if renal function is stable. Previously IV lasix  held due to rising Cr  Cardiogenic shock  - milrinone  started on 5/27, stopped on 5/30  - RHC elevated filling pressure, preserved cardio output  - amiodarone  stopped this morning. No arrythmia on tele  Severe aortic stenosis - discussed with structural heart team, plan for outpatient TAVR eval  - seen by Dr. Abel Hoe for consideration of TAVR "We have reviewed his options for TAVR. He sizes to a 29 mm Sapien valve and has femoral access. Unfortunately he has residual LV thrombus by MRI today. TAVR would put him at high risk for a cardioembolic event. He understands this. Will delay TAVR for now. Continue to optimize medically." Given LV thrombus, will delay TAVR for now.    - if no plan for TAVR during this admission, suspect the patient can be discharged in the next 24-48 hours  LV thrombus  - suspected on echocardiogram 5/27. Cardiac MRI 02/26/2024 showed very small 5mm LV apical thrombus  - started on Eliquis , last dose 6/6, currently back on IV heparin , given the delay on TAVR, consider restart on Eliquis   Hyperlipidemia  CKD stage III  Hypothyroidism: on levothyroxine   Iron  deficiency anemia: no evidence of bleed. Given IV iron   Gout: colchicine   Atrial fibrillation: Amiodarone  stopped. HR around 49 to 50s. Remain in rate controlled aflutter. Consider restart Eliquis      For questions or updates, please contact [facility] Please consult [email or web address] for contact info under     Signed, Ervin Heath, PA  02/28/2024, 1:21 PM     Attending Note:   The patient was seen and examined.  Agree with assessment and plan as noted above.  Changes made to the above note as needed.  Patient seen and  independently examined with Ervin Heath, PA .   We discussed all aspects of the encounter. I agree with the assessment and plan as stated above.     CAD : Status post an anterior lateral ST segment elevation myocardial infarction.  He was treated with DES to the LAD on Feb 15, 2024.  He denies any episodes of chest discomfort.  He does have a moderately reduced LVEF of 32%.  There is apical akinesis and a recent MRI revealed an apical thrombus. Continue plavix  and eliquis    He has been started on Eliquis  for this apical thrombus.  2.  Acute systolic congestive heart failure: His ejection fraction was 15 to 20% by echo.  Cardiac MRI on June 5 revealed LVEF of 32%.  He seems to be improving slowly.  He was found to have apical thrombus and will need to be started on anticoagulation. Continue digoxin  and spironolactone . He is not on ARB or entresto due to renal dysfunction   3.  Severe AS :   will need TAVR evaluation as OP.   His inpatient work up was delayed due to the presence of his apical LV thrombu s     I have spent a total of 40 minutes with patient reviewing hospital  notes , telemetry, EKGs, labs and examining patient as well as establishing an assessment and plan that was discussed with the patient.  > 50% of time was spent in direct patient care.    [place], Marieta Shorten., MD, [facility] 02/28/2024, 2:10 PM [address]  [address] Office [phone] Pager [phone]

## 2024-02-29 ENCOUNTER — Other Ambulatory Visit (HOSPITAL_COMMUNITY): Payer: Self-pay

## 2024-02-29 ENCOUNTER — Encounter (HOSPITAL_COMMUNITY): Payer: Self-pay | Admitting: Physician Assistant

## 2024-02-29 DIAGNOSIS — I2102 ST elevation (STEMI) myocardial infarction involving left anterior descending coronary artery: Secondary | ICD-10-CM | POA: Diagnosis not present

## 2024-02-29 LAB — CBC
HCT: 33.4 % — ABNORMAL LOW (ref 39.0–52.0)
Hemoglobin: 9.6 g/dL — ABNORMAL LOW (ref 13.0–17.0)
MCH: 24.7 pg — ABNORMAL LOW (ref 26.0–34.0)
MCHC: 28.7 g/dL — ABNORMAL LOW (ref 30.0–36.0)
MCV: 86.1 fL (ref 80.0–100.0)
Platelets: 345 10*3/uL (ref 150–400)
RBC: 3.88 MIL/uL — ABNORMAL LOW (ref 4.22–5.81)
RDW: 21 % — ABNORMAL HIGH (ref 11.5–15.5)
WBC: 16.1 10*3/uL — ABNORMAL HIGH (ref 4.0–10.5)
nRBC: 0 % (ref 0.0–0.2)

## 2024-02-29 LAB — BASIC METABOLIC PANEL WITH GFR
Anion gap: 8 (ref 5–15)
BUN: 38 mg/dL — ABNORMAL HIGH (ref 8–23)
CO2: 23 mmol/L (ref 22–32)
Calcium: 9.1 mg/dL (ref 8.9–10.3)
Chloride: 106 mmol/L (ref 98–111)
Creatinine, Ser: 1.9 mg/dL — ABNORMAL HIGH (ref 0.61–1.24)
GFR, Estimated: 37 mL/min — ABNORMAL LOW (ref >60)
Glucose, Bld: 180 mg/dL — ABNORMAL HIGH (ref 70–99)
Potassium: 4.6 mmol/L (ref 3.5–5.1)
Sodium: 137 mmol/L (ref 135–145)

## 2024-02-29 LAB — GLUCOSE, CAPILLARY
Glucose-Capillary: 154 mg/dL — ABNORMAL HIGH (ref 70–99)
Glucose-Capillary: 196 mg/dL — ABNORMAL HIGH (ref 70–99)

## 2024-02-29 MED ORDER — AMIODARONE HCL 200 MG PO TABS
200.0000 mg | ORAL_TABLET | Freq: Every day | ORAL | 5 refills | Status: DC
  Filled 2024-02-29: qty 30, 30d supply, fill #0

## 2024-02-29 MED ORDER — AMIODARONE HCL 200 MG PO TABS
200.0000 mg | ORAL_TABLET | Freq: Every day | ORAL | Status: DC
  Administered 2024-02-29: 200 mg via ORAL
  Filled 2024-02-29: qty 1

## 2024-02-29 MED ORDER — ROSUVASTATIN CALCIUM 40 MG PO TABS
40.0000 mg | ORAL_TABLET | Freq: Every day | ORAL | 5 refills | Status: DC
  Filled 2024-02-29: qty 30, 30d supply, fill #0

## 2024-02-29 MED ORDER — CLOPIDOGREL BISULFATE 75 MG PO TABS
75.0000 mg | ORAL_TABLET | Freq: Every day | ORAL | 5 refills | Status: DC
  Filled 2024-02-29: qty 30, 30d supply, fill #0

## 2024-02-29 MED ORDER — DIGOXIN 125 MCG PO TABS
0.1250 mg | ORAL_TABLET | Freq: Every day | ORAL | 5 refills | Status: DC
  Filled 2024-02-29 – 2024-08-16 (×4): qty 30, 30d supply, fill #0
  Filled 2024-09-19: qty 30, 30d supply, fill #1

## 2024-02-29 MED ORDER — APIXABAN 5 MG PO TABS
5.0000 mg | ORAL_TABLET | Freq: Two times a day (BID) | ORAL | 5 refills | Status: AC
  Filled 2024-02-29 – 2024-08-16 (×4): qty 60, 30d supply, fill #0
  Filled 2024-09-19: qty 60, 30d supply, fill #1
  Filled 2024-10-17: qty 60, 30d supply, fill #2

## 2024-02-29 MED ORDER — FUROSEMIDE 40 MG PO TABS
ORAL_TABLET | ORAL | 2 refills | Status: AC
  Filled 2024-02-29: qty 30, 30d supply, fill #0

## 2024-02-29 MED ORDER — DAPAGLIFLOZIN PROPANEDIOL 10 MG PO TABS
10.0000 mg | ORAL_TABLET | Freq: Every day | ORAL | 5 refills | Status: DC
  Filled 2024-02-29: qty 30, 30d supply, fill #0

## 2024-02-29 MED ORDER — SPIRONOLACTONE 25 MG PO TABS
12.5000 mg | ORAL_TABLET | Freq: Every day | ORAL | 5 refills | Status: DC
  Filled 2024-02-29: qty 16, 32d supply, fill #0

## 2024-02-29 MED ORDER — PANTOPRAZOLE SODIUM 40 MG PO TBEC
40.0000 mg | DELAYED_RELEASE_TABLET | Freq: Every day | ORAL | 5 refills | Status: DC
  Filled 2024-02-29 – 2024-08-16 (×4): qty 30, 30d supply, fill #0
  Filled 2024-09-19: qty 30, 30d supply, fill #1

## 2024-02-29 MED ORDER — DAPAGLIFLOZIN PROPANEDIOL 10 MG PO TABS
10.0000 mg | ORAL_TABLET | Freq: Every day | ORAL | Status: DC
  Administered 2024-02-29: 10 mg via ORAL
  Filled 2024-02-29: qty 1

## 2024-02-29 NOTE — TOC Transition Note (Signed)
 Transition of Care [facility]) - Discharge Note   Patient Details  Name: Mark Meadows MRN: [id number] Date of Birth: [birth year]  [facility] CM/SW Contact:  Benjiman Bras, RN Phone Number: [phone] 02/29/2024, 1:09 PM   Clinical Narrative:    TOC CM spoke to pt and needs taxi voucher for home. Provided taxi voucher. Pt declined HH. Updated attending. Pt states he has a friend that can assist him at home.    Final next level of care: Home/Self Care Barriers to Discharge: No Barriers Identified   Patient Goals and CMS Choice Patient states their goals for this hospitalization and ongoing recovery are:: wants to get better CMS Medicare.gov Compare Post Acute Care list provided to:: Patient Choice offered to / list presented to : Patient      Discharge Placement                       Discharge Plan and Services Additional resources added to the After Visit Summary for     Discharge Planning Services: CM Consult Post Acute Care Choice: Home Health                    [facility] Agency: [facility] Date [facility] Agency Contacted: 02/23/24 Time HH Agency Contacted: 1534 Representative spoke with at [facility] Agency: Marcina Severe  Social Drivers of Health (SDOH) Interventions SDOH Screenings   Food Insecurity: No Food Insecurity (02/15/2024)  Housing: Low Risk  (02/15/2024)  Transportation Needs: No Transportation Needs (02/15/2024)  Utilities: Not At Risk (02/15/2024)  Financial Resource Strain: Low Risk  (12/17/2022)   Received from [facility]  Physical Activity: Inactive (02/07/2022)   Received from [facility], [facility]  Social Connections: Moderately Isolated (02/15/2024)  Stress: No Stress Concern Present (02/07/2022)   Received from [facility], [facility]  Tobacco Use: Medium Risk (02/25/2024)     Readmission Risk Interventions     No data to display

## 2024-02-29 NOTE — Progress Notes (Signed)
 Reviewed AVS, patient expressed understanding of medications, MD follow up reviewed.   Patient states all belongings brought to the hospital at time of admission are accounted for and packed to take home.  Patient informed and expressed understanding he has medications to pick up from [facility] pharmacy  before discharging. Pt transported to Discharge lounge to wait for transportation home.

## 2024-02-29 NOTE — Progress Notes (Signed)
 Patient ID: Mark Meadows, male   DOB: [birth year], 73 y.o.   MRN: [id number]   Rounding Note   Patient Name: Mark Meadows Date of Encounter: 02/29/2024  [facility] Cardiologist: Peder Bourdon, MD   Subjective  No chest pain or dyspnea today. To work with PT.   Scheduled Meds:  apixaban   5 mg Oral BID   Chlorhexidine  Gluconate Cloth  6 each Topical Daily   clopidogrel   75 mg Oral Q breakfast   digoxin   0.125 mg Oral Daily   folic acid   1 mg Oral Daily   insulin  aspart  0-15 Units Subcutaneous TID WC   insulin  aspart  0-5 Units Subcutaneous QHS   insulin  aspart  3 Units Subcutaneous TID WC   levothyroxine   150 mcg Oral Q0600   And   levothyroxine   150 mcg Oral Once per day on Wednesday Saturday   multivitamin with minerals  1 tablet Oral Daily   pantoprazole   40 mg Oral Daily   polyethylene glycol  17 g Oral Daily   rosuvastatin   40 mg Oral Daily   senna-docusate  1 tablet Oral Daily   sodium chloride  flush  3 mL Intravenous Q12H   spironolactone   12.5 mg Oral Daily   thiamine   100 mg Oral Daily   Continuous Infusions:   PRN Meds: acetaminophen , diazepam , guaiFENesin-dextromethorphan, methocarbamol , nitroGLYCERIN , mouth rinse, sodium chloride  flush, sodium chloride  flush   Vital Signs  Vitals:   02/29/24 0436 02/29/24 0622 02/29/24 0723 02/29/24 0809  BP: (!) 108/58  95/63   Pulse: 68  65 69  Resp: 17  20   Temp: 97.7 F (36.5 C)  98.2 F (36.8 C)   TempSrc: Oral  Oral   SpO2: 96%  94%   Weight:  95 kg    Height:        Intake/Output Summary (Last 24 hours) at 02/29/2024 0918 Last data filed at 02/28/2024 1500 Gross per 24 hour  Intake 220 ml  Output 300 ml  Net -80 ml      02/29/2024    6:22 AM 02/28/2024    5:21 AM 02/27/2024    6:37 AM  Last 3 Weights  Weight (lbs) 209 lb 7 oz 211 lb 10.3 oz 212 lb 1.3 oz  Weight (kg) 95 kg 96 kg 96.2 kg      Telemetry Atrial flutter without significant ventricular ectopy, rate around 100 bpm - Personally  Reviewed  Physical Exam  General: NAD Neck: No JVD, no thyromegaly or thyroid nodule.  Lungs: Clear to auscultation bilaterally with normal respiratory effort. CV: Nondisplaced PMI.  Heart regular S1/S2, no S3/S4, 3/6 SEM RUSB with some obscuring of S2.  No peripheral edema.  Abdomen: Soft, nontender, no hepatosplenomegaly, no distention.  Skin: Intact without lesions or rashes.  Neurologic: Alert and oriented x 3.  Psych: Normal affect. Extremities: No clubbing or cyanosis.  HEENT: Normal.   Labs High Sensitivity Troponin:   Recent Labs  Lab 02/15/24 1842 02/15/24 2139  TROPONINIHS >24,000* >24,000*     Chemistry Recent Labs  Lab 02/26/24 0357 02/27/24 0301 02/29/24 0409  NA 131* 131* 137  K 4.8 4.6 4.6  CL 100 102 106  CO2 22 19* 23  GLUCOSE 283* 323* 180*  BUN 47* 44* 38*  CREATININE 2.00* 1.84* 1.90*  CALCIUM  8.7* 8.3* 9.1  GFRNONAA 34* 38* 37*  ANIONGAP 9 10 8     Lipids No results for input(s): "CHOL", "TRIG", "HDL", "LABVLDL", "LDLCALC", "CHOLHDL" in the last 168 hours.  Hematology Recent Labs  Lab 02/24/24 0500 02/25/24 0440 02/29/24 0409  WBC 10.5 13.8* 16.1*  RBC 3.47* 3.55* 3.88*  HGB 8.4* 8.3* 9.6*  HCT 28.0* 28.6* 33.4*  MCV 80.7 80.6 86.1  MCH 24.2* 23.4* 24.7*  MCHC 30.0 29.0* 28.7*  RDW 18.1* 18.0* 21.0*  PLT 312 366 345   Thyroid No results for input(s): "TSH", "FREET4" in the last 168 hours.  BNPNo results for input(s): "BNP", "PROBNP" in the last 168 hours.  DDimer No results for input(s): "DDIMER" in the last 168 hours.   Radiology  No results found.  Cardiac Studies  Cath 02/15/2024   Prox LAD to Mid LAD lesion is 100% stenosed.   A drug-eluting stent was successfully placed using a STENT SYNERGY XD 3.0X16.   Post intervention, there is a 0% residual stenosis.   LV end diastolic pressure is severely elevated.   The left ventricular ejection fraction is 25-35% by visual estimate.   Recommend uninterrupted dual antiplatelet  therapy with Aspirin  81mg  daily and Clopidogrel  75mg  daily for a minimum of 12 months (ACS-Class I recommendation).   1.  Acute, late presenting STEMI involving the LAD, treated successfully with PCI using PTCA followed by stenting of the proximal LAD with baseline TIMI 0 flow, restored to TIMI-3 flow at the completion of the procedure 2.  Mild nonobstructive plaquing in the left main, left circumflex, and RCA with a heavily calcified RCA 3.  Severe segmental LV dysfunction with akinesis of the entire anterolateral wall, apex, and inferoapex, LVEF estimated at approximately 25 to 30% 4.  Angiographic findings on ventriculography suspicious for LV apical thrombus 5.  Severely elevated LVEDP   Recommendations: DAPT with aspirin  and clopidogrel  minimum of 12 months, check 2D echo with contrast to evaluate for LV apical thrombus and accurately assess LVEF, post MI medical therapy, IV diuresis with furosemide  in the setting of high LVEDP (acute systolic heart failure secondary to acute anterior infarct).  Institute GDMT as tolerated.    Echo 02/16/2024 1. Akinesis of the mid septum into apex and inferior septum consistent  with LAD infarction. LVEF 15-20%. Cannot exclude a layered LV thrombus on  contrast imaging. Consider cardiac MRI for definitive evaluation. LVOT VTI  9 cm. CO estimated 1.8 L/min and  CI 0.8 L/min/m2, concerning for low output state. Left ventricular  ejection fraction, by estimation, is 15-20%. The left ventricle has  severely decreased function. The left ventricle demonstrates regional wall  motion abnormalities (see scoring  diagram/findings for description). There is moderate concentric left  ventricular hypertrophy. Indeterminate diastolic filling due to E-A  fusion.   2. Severely calcified aortic valve with restricted leaflet movement. Vmax  2.6 m/s, MG 16 mmHG, AVA 0.81 cm2, DI 0.24. This is concerning for low  flow low gradient severe aortic stenosis. Recommend clinical  correlation.  The aortic valve is calcified.  Aortic valve regurgitation is trivial. Moderate to severe aortic valve  stenosis.   3. Right ventricular systolic function is normal. The right ventricular  size is mildly enlarged. Tricuspid regurgitation signal is inadequate for  assessing PA pressure.   4. The mitral valve is degenerative. Mild mitral valve regurgitation. No  evidence of mitral stenosis.   5. The inferior vena cava is dilated in size with >50% respiratory  variability, suggesting right atrial pressure of 8 mmHg.     Cardiac MRI 02/25/2024 IMPRESSION: 1.  Airspace disease in lower lung fields as above.   2. Normal LV size with mild LV hypertrophy. Wall  motion abnormalities as noted above in LAD infarction pattern. LV EF 32%.   3.  Normal RV size with RV EF 44%.   4. Thickened/calcified aortic valve, visually severe stenosis. Mild regurgitation with regurgitant fraction 19%.   5.  Moderate mitral regurgitation with regurgitant fraction 30%.   6. Delayed enhancement pattern suggests dense LAD territory myocardial infarction. The infarcted segments are unlikely to be viable with revascularization.   7.  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   8. Mildly elevated extracellular volume percentage at 32% likely related to scar from anterior infarction.     Patient Profile   73 y.o. male who presented on 02/15/2024 with late presenting anterolateral STEMI. Cath showed 100% prox to mid LAD lesion treated with Synergy DES, EF 25-35%. Hospital course complicated by cardiogenic shock requiring milrinone . Subsequent echo showed akinesis of LAD wall with EF 15-20%, severe aortic stenosis.   Assessment & Plan   1.  Anterolateral STEMI  - Cath 02/15/2024 showed 100% prox to mid LAD lesion treated with Synergy DES, EF 25-35%.   - cardiac MRI 02/25/2024 showed LVEF 32%, RVEF 44%, mild AI, severe AS, moderate MR, LAD territory infarction unlikely to be viable, suspect very  small 5mm LV apical thrombus.   - ASA stopped given apixaban  use, continue plavix .   - Continue Crestor  40 mg daily.  2.  Acute systolic heart failure/ ischemic cardiomyopathy  - EF 15-20% on echo. RHC showed CI 2.4.  EF 32% on cMRI with dense LAD territory infarction, suspect nonviable.   - euvolemic on exam today.   - continue digoxin  and spironolactone . No BB given severe LV dysfunction.   - Start Farxiga 10 mg daily.  - Can use Lasix  prn at home.  3.  Severe aortic stenosis  - seen by Dr. Abel Hoe for consideration of TAVR "We have reviewed his options for TAVR. He sizes to a 29 mm Sapien valve and has femoral access. Unfortunately he has residual small LV thrombus by MRI today. TAVR would put him at high risk for a cardioembolic event. He understands this. Will delay TAVR for now. Continue to optimize medically." Given LV thrombus, will delay TAVR for now.    -He will need close followup in structural heart clinic with Dr. Abel Hoe to determine TAVR timing.  4.  LV thrombus  - suspected on echocardiogram 5/27. Cardiac MRI 02/26/2024 showed very small 5mm LV apical thrombus  - Now on Eliquis .    - Thrombus will delay TAVR, followup structural heart clinic after discharge to determine timing.  5.  CKD stage 3: Creatinine stable at 1.9.  - Adding Farxiga today.  6.  Iron  deficiency anemia: no evidence of bleed. Given IV iron  7.  Gout: colchicine  8.  Atrial fibrillation/flutter:   - Remains in atrial flutter rate around 100.   - Resume amiodarone  200 mg daily, will aim to cardiovert him after TAVR.   - Continue apixaban .    Signed, Peder Bourdon, MD  02/29/2024, 9:18 AM

## 2024-02-29 NOTE — TOC CM/SW Note (Signed)
.  02/29/2024  Mark Meadows DOB: [birth year] MRN: [id number]   RIDER WAIVER AND RELEASE OF LIABILITY  For the purposes of helping with transportation needs, [facility] partners with outside transportation providers (taxi companies, [place], Catering manager.) to give [facility] patients or other approved people the choice of on-demand rides [employer]") to our buildings for non-emergency visits.  By using [employer], I, the person signing this document, on behalf of myself and/or any legal minors (in my care using the [employer]), agree:  Science writer given to me are supplied by independent, outside transportation providers who do not work for, or have any affiliation with, [employer]. [employer] is not a transportation company. [facility] has no control over the quality or safety of the rides I get using [employer]. [facility] has no control over whether any outside ride will happen on time or not. [facility] gives no guarantee on the reliability, quality, safety, or availability on any rides, or that no mistakes will happen. I know and accept that traveling by vehicle (car, truck, SVU, [employer], bus, taxi, etc.) has risks of serious injuries such as disability, being paralyzed, and death. I know and agree the risk of using [employer] is mine alone, and not [employer]. Transport Services are provided "as is" and as are available. The transportation providers are in charge for all inspections and care of the vehicles used to provide these rides. I agree not to take legal action against [name], its agents, employees, officers, directors, representatives, insurers, attorneys, assigns, successors, subsidiaries, and affiliates at any time for any reasons related directly or indirectly to using [employer]. I also agree not to take legal action against [facility] or its affiliates for any injury, death, or damage to property caused by or related to using  [employer]. I have read this Waiver and Release of Liability, and I understand the terms used in it and their legal meaning. This Waiver is freely and voluntarily given with the understanding that my right (or any legal minors) to legal action against [place] relating to [employer] is knowingly given up to use these services.   I attest that I read the Ride Waiver and Release of Liability to Mark Meadows, gave Mr. Foskey the opportunity to ask questions and answered the questions asked (if any). I affirm that Mark Meadows then provided consent for assistance with transportation.

## 2024-02-29 NOTE — Progress Notes (Signed)
 PICC Removal Note: PICC line removed from RUE. PICC catheter tip visualized and intact. Pressure dressing applied. No redness, ecchymosis, edema, swelling, or drainage noted at site. Instructions provided on post PICC discharge care, including followup notification instructions.  Bedrest until 1255.

## 2024-02-29 NOTE — Progress Notes (Signed)
 Physical Therapy Treatment Patient Details Name: Mark Meadows MRN: [id number] DOB: Jun 06, 1951 Today's Date: 02/29/2024   History of Present Illness 73 y.o. male presents to [facility] 02/15[facility]24 as transfer from [facility] ED for evaluation of STEMI, stent placed. Complicated by cardiogenic shock. PMHx: HTN, gout, thrombocytopenia, anemia, dysrhythmia, CKD stage 3a.  Heart Cath procedure 02/22/24.    PT Comments  Pt eager to return home. Pt amb 200' without AD and negotiated 4 steps to safely enter his home. Pt with noted significant SOB with mobility however was unable to get a pulse ox reading to assess SpO2. Discussed energy conservation and the plan for future TAVR procedure per Dr. Mitzie Anda. Pt declining HHPT at this time stating "I am cheap." "I don't have a lot of space in my 1,000sq ft house with my 21 cats and dogs." Spoke with TOC regarding his request for ambulance to take him home in addition to declining [facility] services. Acute PT to cont to follow.     If plan is discharge home, recommend the following: Assistance with cooking/housework;Assist for transportation;Help with stairs or ramp for entrance;A little help with walking and/or transfers;A little help with bathing/dressing/bathroom   Can travel by private vehicle     Yes  Equipment Recommendations  None recommended by PT    Recommendations for Other Services       Precautions / Restrictions Precautions Precautions: Fall Recall of Precautions/Restrictions: Intact Precaution/Restrictions Comments: watch O2 Restrictions Weight Bearing Restrictions Per Provider Order: No     Mobility  Bed Mobility Overal bed mobility: Needs Assistance Bed Mobility: Supine to Sit       Sit to supine: Contact guard assist   General bed mobility comments: HOB flat, minA for trunk elevation    Transfers Overall transfer level: Needs assistance Equipment used: None Transfers: Sit to/from Stand Sit to Stand: Contact guard assist, Supervision            General transfer comment: increased time, verbal cues for anterior weight shift    Ambulation/Gait Ambulation/Gait assistance: Contact guard assist, Supervision Gait Distance (Feet): 200 Feet Assistive device: None Gait Pattern/deviations: Step-through pattern, Decreased stance time - right, Decreased stride length, Decreased weight shift to right, Antalgic Gait velocity: decr Gait velocity interpretation: 1.31 - 2.62 ft/sec, indicative of limited community ambulator   General Gait Details: overall steady, mild sway but no overt LOB. noted 3/4 DOE however was unable to get a pulse ox reading from either portable pulse ox or tele pulse ox, s/p 3 min of trying portable pulse ox did read 100%   Stairs Stairs: Yes Stairs assistance: Contact guard assist Stair Management: Alternating pattern, Step to pattern, Forwards, Two rails Number of Stairs: 4 (to mimic home set up) General stair comments: pt ascended reciprocally, descended step to pattern due R knee pain, offered suggestions for R knee pain management however pt reporting "the right way is my way"   Wheelchair Mobility     Tilt Bed    Modified Rankin (Stroke Patients Only)       Balance Overall balance assessment: Needs assistance Sitting-balance support: No upper extremity supported, Feet supported Sitting balance-Leahy Scale: Good     Standing balance support: Reliant on assistive device for balance Standing balance-Leahy Scale: Good                              Communication Communication Communication: Impaired Factors Affecting Communication: Hearing impaired  Cognition Arousal: Alert Behavior During Therapy:  WFL for tasks assessed/performed   PT - Cognitive impairments: No apparent impairments                       PT - Cognition Comments: very particular and set in his ways, didn't take kindly to PT recommendations stating "the safest way is my way" Following commands: Intact       Cueing Cueing Techniques: Verbal cues  Exercises      General Comments General comments (skin integrity, edema, etc.): noted SOB with amb otherwise VSS, unable to get pulse ox reading      Pertinent Vitals/Pain Pain Assessment Pain Assessment: 0-10 Pain Score: 4  Pain Location: R knee Pain Descriptors / Indicators: Grimacing, Guarding, Sharp Pain Intervention(s): Monitored during session    Home Living                          Prior Function            PT Goals (current goals can now be found in the care plan section) Acute Rehab PT Goals PT Goal Formulation: With patient Time For Goal Achievement: 03/02/24 Potential to Achieve Goals: Good Progress towards PT goals: Progressing toward goals    Frequency    Min 2X/week      PT Plan      Co-evaluation              AM-PAC PT "6 Clicks" Mobility   Outcome Measure  Help needed turning from your back to your side while in a flat bed without using bedrails?: A Little Help needed moving from lying on your back to sitting on the side of a flat bed without using bedrails?: A Little Help needed moving to and from a bed to a chair (including a wheelchair)?: A Little Help needed standing up from a chair using your arms (e.g., wheelchair or bedside chair)?: A Little Help needed to walk in hospital room?: A Little Help needed climbing 3-5 steps with a railing? : A Little 6 Click Score: 18    End of Session Equipment Utilized During Treatment: Gait belt Activity Tolerance: No increased pain;Patient tolerated treatment well Patient left: with call bell/phone within reach;in chair Nurse Communication: Mobility status PT Visit Diagnosis: Other abnormalities of gait and mobility (R26.89);Unsteadiness on feet (R26.81)     Time: 9562-1308 PT Time Calculation (min) (ACUTE ONLY): 22 min  Charges:    $Gait Training: 8-22 mins PT General Charges $$ ACUTE PT VISIT: 1 Visit                     Renaee Caro, PT, DPT Acute Rehabilitation Services Secure chat preferred Office #: [phone]    Jenna Moan 02/29/2024, 11:43 AM

## 2024-02-29 NOTE — Progress Notes (Signed)
 Occupational Therapy Treatment Patient Details Name: Mark Meadows MRN: [id number] DOB: 1951/03/13 Today's Date: 02/29/2024   History of present illness 73 y.o. male presents to [facility] 02/15/24 as transfer from [facility] ED for evaluation of STEMI, stent placed. Complicated by cardiogenic shock. PMHx: HTN, gout, thrombocytopenia, anemia, dysrhythmia, CKD stage 3a.  Heart Cath procedure 02/22/24.   OT comments  Patient getting ready for discharge, able to complete ADL from sit to stand with no assist.  Patient does have a roommate at home who can assist over the short term if needed.  No further OT needs in the acute setting and no post acute OT recommended.        If plan is discharge home, recommend the following:      Equipment Recommendations  None recommended by OT    Recommendations for Other Services      Precautions / Restrictions Precautions Precautions: Fall Restrictions Weight Bearing Restrictions Per Provider Order: No       Mobility Bed Mobility               General bed mobility comments: Sitting in chair    Transfers Overall transfer level: Modified independent                       Balance Overall balance assessment: Mild deficits observed, not formally tested                                         ADL either performed or assessed with clinical judgement   ADL Overall ADL's : At baseline                                            Extremity/Trunk Assessment Upper Extremity Assessment Upper Extremity Assessment: Overall WFL for tasks assessed   Lower Extremity Assessment Lower Extremity Assessment: Defer to PT evaluation   Cervical / Trunk Assessment Cervical / Trunk Assessment: Normal    Vision Patient Visual Report: No change from baseline     Perception Perception Perception: Not tested   Praxis Praxis Praxis: Not tested   Communication Communication Communication: No apparent difficulties Factors  Affecting Communication: Hearing impaired   Cognition Arousal: Alert Behavior During Therapy: Restless Cognition: No apparent impairments                               Following commands: Intact        Cueing   Cueing Techniques: Verbal cues  Exercises      Shoulder Instructions       General Comments noted SOB with amb otherwise VSS, unable to get pulse ox reading    Pertinent Vitals/ Pain       Pain Assessment Pain Assessment: Faces Faces Pain Scale: Hurts a little bit Pain Location: R knee Pain Descriptors / Indicators: Sore Pain Intervention(s): Monitored during session                                                          Frequency  Progress Toward Goals  OT Goals(current goals can now be found in the care plan section)  Progress towards OT goals: Goals met/education completed, patient discharged from OT     Plan      Co-evaluation                 AM-PAC OT "6 Clicks" Daily Activity     Outcome Measure   Help from another person eating meals?: None Help from another person taking care of personal grooming?: None Help from another person toileting, which includes using toliet, bedpan, or urinal?: None Help from another person bathing (including washing, rinsing, drying)?: None Help from another person to put on and taking off regular upper body clothing?: None Help from another person to put on and taking off regular lower body clothing?: None 6 Click Score: 24    End of Session    OT Visit Diagnosis: Unsteadiness on feet (R26.81);Muscle weakness (generalized) (M62.81);Pain Pain - Right/Left: Left Pain - part of body: Knee;Ankle and joints of foot   Activity Tolerance Patient tolerated treatment well   Patient Left in chair;with nursing/sitter in room   Nurse Communication Other (comment)        Time: 4098-1191 OT Time Calculation (min): 16 min  Charges: OT General Charges $OT  Visit: 1 Visit OT Treatments $Self Care/Home Management : 8-22 mins  02/29/2024  RP, OTR/L  Acute Rehabilitation Services  Office:  [phone]   Benjamen Brand 02/29/2024, 1:18 PM

## 2024-03-01 LAB — VON WILLEBRAND FACTOR MULTIMER

## 2024-03-02 ENCOUNTER — Telehealth: Payer: Self-pay

## 2024-03-02 DIAGNOSIS — I359 Nonrheumatic aortic valve disorder, unspecified: Secondary | ICD-10-CM

## 2024-03-02 DIAGNOSIS — I513 Intracardiac thrombosis, not elsewhere classified: Secondary | ICD-10-CM

## 2024-03-02 NOTE — Telephone Encounter (Signed)
 Pt's case was discussed yesterday in the Multidisciplinary Valve Team meeting to help determine timing of TAVR. Plan to repeat Cardiac MRI in 4-6 weeks to assess for LV apical thrombus. The pt will need to see Dr Arlester Ladd in follow up to discuss the results of testing and determine TAVR plan.   Order placed and message sent to scheduler to arrange cMRI. Will need to hold off on scheduling appointment with Dr Arlester Ladd until cMRI appointment known.   Left message for pt to contact the office to discuss plan.

## 2024-03-03 ENCOUNTER — Telehealth (HOSPITAL_COMMUNITY): Payer: Self-pay

## 2024-03-03 NOTE — Telephone Encounter (Signed)
 Pt insurance is active and benefits verified through [facility] Medicare. Co-pay $0.00, DED $0.00/$0.00 met, out of pocket $3,900.00/$275.00 met, co-insurance 0%. No pre-authorization required. Passport, 03/03/24 @ 4:32PM, REF#[id number]   How many CR sessions are covered? (36 visits for TCR, 72 visits for ICR)72 Is this a lifetime maximum or an annual maximum? Annual Has the member used any of these services to date? No Is there a time limit (weeks/months) on start of program and/or program completion? No     Will contact patient to see if he is interested in the Cardiac Rehab Program. If interested, patient will need to complete follow up appt. Once completed, patient will be contacted for scheduling upon review by the RN Navigator.

## 2024-03-03 NOTE — Telephone Encounter (Signed)
 Attempted to call patient in regards to Cardiac Rehab - LM on VM

## 2024-03-07 ENCOUNTER — Telehealth (HOSPITAL_COMMUNITY): Payer: Self-pay | Admitting: Family Medicine

## 2024-03-07 NOTE — Telephone Encounter (Signed)
 Can you assist pt w/transport on 06/19 for hosp f/u. Mark Meadows is out today and forms are locked up in her file cabinet, pt tried to call insurance company, he was told not eligible

## 2024-03-09 ENCOUNTER — Telehealth (HOSPITAL_COMMUNITY): Payer: Self-pay

## 2024-03-09 NOTE — Telephone Encounter (Signed)
 Called to confirm/remind patient of their appointment at the [facility] on 03/10/24.   Appointment:   [] Confirmed  [x] Left mess   [] No answer/No voice mail  [] VM Full/unable to leave message  [] Phone not in service  And to bring in all medications and/or complete list.

## 2024-03-10 ENCOUNTER — Ambulatory Visit (HOSPITAL_COMMUNITY)
Admission: RE | Admit: 2024-03-10 | Discharge: 2024-03-10 | Disposition: A | Discharge status: Home / Self Care | Source: Ambulatory Visit | Attending: Adult Health | Admitting: Adult Health

## 2024-03-10 ENCOUNTER — Encounter (HOSPITAL_COMMUNITY): Payer: Self-pay

## 2024-03-10 ENCOUNTER — Ambulatory Visit (HOSPITAL_COMMUNITY): Payer: Self-pay | Admitting: Adult Health

## 2024-03-10 ENCOUNTER — Encounter (HOSPITAL_COMMUNITY): Payer: Self-pay | Admitting: Cardiology

## 2024-03-10 VITALS — BP 112/64 | HR 78 | Ht 69.0 in | Wt 208.0 lb

## 2024-03-10 DIAGNOSIS — I252 Old myocardial infarction: Secondary | ICD-10-CM | POA: Diagnosis not present

## 2024-03-10 DIAGNOSIS — M109 Gout, unspecified: Secondary | ICD-10-CM | POA: Diagnosis not present

## 2024-03-10 DIAGNOSIS — Z7902 Long term (current) use of antithrombotics/antiplatelets: Secondary | ICD-10-CM | POA: Diagnosis not present

## 2024-03-10 DIAGNOSIS — I2510 Atherosclerotic heart disease of native coronary artery without angina pectoris: Secondary | ICD-10-CM | POA: Diagnosis not present

## 2024-03-10 DIAGNOSIS — Z7984 Long term (current) use of oral hypoglycemic drugs: Secondary | ICD-10-CM | POA: Diagnosis not present

## 2024-03-10 DIAGNOSIS — Z7901 Long term (current) use of anticoagulants: Secondary | ICD-10-CM | POA: Insufficient documentation

## 2024-03-10 DIAGNOSIS — E1122 Type 2 diabetes mellitus with diabetic chronic kidney disease: Secondary | ICD-10-CM | POA: Insufficient documentation

## 2024-03-10 DIAGNOSIS — I513 Intracardiac thrombosis, not elsewhere classified: Secondary | ICD-10-CM | POA: Insufficient documentation

## 2024-03-10 DIAGNOSIS — Z87891 Personal history of nicotine dependence: Secondary | ICD-10-CM | POA: Diagnosis not present

## 2024-03-10 DIAGNOSIS — I5022 Chronic systolic (congestive) heart failure: Secondary | ICD-10-CM | POA: Diagnosis present

## 2024-03-10 DIAGNOSIS — I480 Paroxysmal atrial fibrillation: Secondary | ICD-10-CM | POA: Diagnosis not present

## 2024-03-10 DIAGNOSIS — Z8249 Family history of ischemic heart disease and other diseases of the circulatory system: Secondary | ICD-10-CM | POA: Diagnosis not present

## 2024-03-10 DIAGNOSIS — I5021 Acute systolic (congestive) heart failure: Secondary | ICD-10-CM | POA: Diagnosis not present

## 2024-03-10 DIAGNOSIS — Z79899 Other long term (current) drug therapy: Secondary | ICD-10-CM | POA: Diagnosis not present

## 2024-03-10 DIAGNOSIS — I4892 Unspecified atrial flutter: Secondary | ICD-10-CM | POA: Diagnosis not present

## 2024-03-10 DIAGNOSIS — I4891 Unspecified atrial fibrillation: Secondary | ICD-10-CM | POA: Diagnosis not present

## 2024-03-10 DIAGNOSIS — N1831 Chronic kidney disease, stage 3a: Secondary | ICD-10-CM | POA: Insufficient documentation

## 2024-03-10 DIAGNOSIS — R000 Tachycardia, unspecified: Secondary | ICD-10-CM | POA: Diagnosis not present

## 2024-03-10 DIAGNOSIS — I350 Nonrheumatic aortic (valve) stenosis: Secondary | ICD-10-CM | POA: Insufficient documentation

## 2024-03-10 DIAGNOSIS — D509 Iron deficiency anemia, unspecified: Secondary | ICD-10-CM | POA: Diagnosis not present

## 2024-03-10 DIAGNOSIS — Z955 Presence of coronary angioplasty implant and graft: Secondary | ICD-10-CM | POA: Diagnosis not present

## 2024-03-10 DIAGNOSIS — I130 Hypertensive heart and chronic kidney disease with heart failure and stage 1 through stage 4 chronic kidney disease, or unspecified chronic kidney disease: Secondary | ICD-10-CM | POA: Insufficient documentation

## 2024-03-10 LAB — GLUCOSE, CAPILLARY
Glucose-Capillary: 165 mg/dL — ABNORMAL HIGH (ref 70–99)
Glucose-Capillary: 271 mg/dL — ABNORMAL HIGH (ref 70–99)

## 2024-03-10 NOTE — Progress Notes (Signed)
 Structural Heart: Dr Abel Hoe Maxie Spaniel  HF MD: Dr Bruce Caper  Chief Complaint: Heart Failure   HPI: Mark Meadows is a 73 y.o. male with a history of alcoholism, prior tobacco use, DM, HTN, CKD IIIa, iron  deficiency anemia, aortic valve stenosis (moderate on echo at [facility] 06/23), prior atrial fibrillation/flutter in setting of ETOH withdrawal (2019).   Admitted 02/15/24 with late presenting anterolateral STEMI s/p PCi/DES to LAD. Echo with EF 15-20%, LV thrombus, RV okay and low flow low gradient severe AS. Course c/b cardiogenic shock requiring inotrope support with milrinone . He was diuresed. GDMT limited by blood pressure and renal impairment. He later went into atrial fibrillation/flutter with controlled rate. Started on PO amiodarone . Decided against cardioversion until valvular heart disease treated. After discussion with structural heart team he was set up for outpatient visit to discuss TAVR. Not currently a candidate d/t LV thrombus (confirmed by cardiac MRI).  Received 1 u RBCs and IV iron  for iron  deficiency anemia. Also treated with colchicine  and prednisone  for gout flare.  Today he returns for post hospital follow up. Overall feeling fine. Denies PND/Orthopnea. No chest pain.  Gets a little short of breath with exertion. Better than before he went in the hosptal. Denies dizziness/syncope. Appetite ok. No fever or chills. Weight at home 205-207 pounds. Taking all medications. He has been taking lasix  daily and 25 mg spiro daily. Saw PCP yesterday and had labs drawn.   ROS: All systems negative except as listed in HPI, PMH and Problem List.  SH:  Social History   Socioeconomic History   Marital status: Widowed    Spouse name: Not on file   Number of children: Not on file   Years of education: Not on file   Highest education level: Not on file  Occupational History   Occupation: retired in 3/17  Tobacco Use   Smoking status: Former    Current packs/day: 0.00    Average  packs/day: 3.0 packs/day for 12.0 years (36.0 ttl pk-yrs)    Types: Cigarettes    Start date: 64    Quit date: 1980    Years since quitting: 45.4   Smokeless tobacco: Never  Substance and Sexual Activity   Alcohol use: Yes    Alcohol/week: 80.0 standard drinks of alcohol    Types: 80 Shots of liquor per week    Comment: 09/02/2016 was at 3 large bottles of cheap bourbon/week; cut down to 2 bottles   Drug use: Yes    Types: Marijuana    Comment: 09/02/2016 tried marijuana 2 times; years ago   Sexual activity: Not Currently  Other Topics Concern   Not on file  Social History Narrative   Not on file   Social Drivers of Health   Financial Resource Strain: Low Risk  (12/17/2022)   Received from [facility]   Overall Financial Resource Strain (CARDIA)    Difficulty of Paying Living Expenses: Not hard at all  Food Insecurity: No Food Insecurity (02/15/2024)   Hunger Vital Sign    Worried About Running Out of Food in the Last Year: Never true    Ran Out of Food in the Last Year: Never true  Transportation Needs: No Transportation Needs (02/15/2024)   PRAPARE - Administrator, Civil Service (Medical): No    Lack of Transportation (Non-Medical): No  Physical Activity: Inactive (02/07/2022)   Received from [facility]   Exercise Vital Sign    On average, how many days per week do you engage  in moderate to strenuous exercise (like a brisk walk)?: 0 days    On average, how many minutes do you engage in exercise at this level?: 0 min  Stress: No Stress Concern Present (02/07/2022)   Received from [facility] - Occupational Stress Questionnaire    Feeling of Stress : Not at all  Social Connections: Moderately Isolated (02/15/2024)   Social Connection and Isolation Panel    Frequency of Communication with Friends and Family: Twice a week    Frequency of Social Gatherings with Friends and Family: Twice a week    Attends  Religious Services: Never    Database administrator or Organizations: No    Attends Engineer, structural: More than 4 times per year    Marital Status: Never married  Intimate Partner Violence: Not At Risk (02/15/2024)   Humiliation, Afraid, Rape, and Kick questionnaire    Fear of Current or Ex-Partner: No    Emotionally Abused: No    Physically Abused: No    Sexually Abused: No    FH:  Family History  Problem Relation Age of Onset   Thyroid disease Mother    Heart disease Father     Past Medical History:  Diagnosis Date   Acute blood loss anemia    Alcohol dependence (HCC)    Alcoholic hepatitis    Dysrhythmia    a. 08/2016 -?afib, not clear. b. 09/2017 - official dx atrial flutter.   E. coli UTI 09/22/2016   GERD (gastroesophageal reflux disease)    Gout    Hematochezia    Hyperglycemia    Hypertension    Hypokalemia    Hypothyroidism    Thrombocytopenia (HCC)     Current Outpatient Medications  Medication Sig Dispense Refill   allopurinol (ZYLOPRIM) 100 MG tablet Take 100 mg by mouth daily.     amiodarone  (PACERONE ) 200 MG tablet Take 1 tablet (200 mg total) by mouth daily. 30 tablet 5   apixaban  (ELIQUIS ) 5 MG TABS tablet Take 1 tablet (5 mg total) by mouth 2 (two) times daily. 60 tablet 5   Cholecalciferol (D3 5000 PO) Take 5,000 Units by mouth daily.     clopidogrel  (PLAVIX ) 75 MG tablet Take 1 tablet (75 mg total) by mouth daily with breakfast. 30 tablet 5   dapagliflozin  propanediol (FARXIGA ) 10 MG TABS tablet Take 1 tablet (10 mg total) by mouth daily. 30 tablet 5   digoxin  (LANOXIN ) 0.125 MG tablet Take 1 tablet (0.125 mg total) by mouth daily. 30 tablet 5   furosemide  (LASIX ) 40 MG tablet Take 1 tablet by mouth as needed for weight gain, lower extremity edema, increased shortness of breath 30 tablet 2   levothyroxine  (SYNTHROID ) 150 MCG tablet Take 150-300 mcg by mouth See admin instructions. Take 150mcg (1 tablet) by mouth 5 days a week, then take  300mcg (2 tablets) 2 days a week.     MAGNESIUM  PO Take 1 tablet by mouth daily.     pantoprazole  (PROTONIX ) 40 MG tablet Take 1 tablet (40 mg total) by mouth daily. 30 tablet 5   rosuvastatin  (CRESTOR ) 40 MG tablet Take 1 tablet (40 mg total) by mouth daily. 30 tablet 5   spironolactone  (ALDACTONE ) 25 MG tablet Take 0.5 tablets (12.5 mg total) by mouth daily. 16 tablet 5   tamsulosin  (FLOMAX ) 0.4 MG CAPS capsule Take 0.4 mg by mouth.     No current facility-administered medications for this encounter.  Vitals:   03/10/24 0844  BP: 112/64  Pulse: 78  SpO2: 94%  Weight: 94.3 kg (208 lb)  Height: 5' 9 (1.753 m)   Wt Readings from Last 3 Encounters:  03/10/24 94.3 kg (208 lb)  02/29/24 95 kg (209 lb 7 oz)  10/05/17 99.8 kg (220 lb)    PHYSICAL EXAM: General:   No resp difficulty. Walked in the clinic.  Neck: supple. no JVD.  Cor: PMI nondisplaced. Irregular rate & rhythm. No rubs, gallops. RUSB murmurs. Lungs: clear Abdomen: soft, nontender, nondistended.  Extremities: no cyanosis, clubbing, rash, edema Neuro: alert & oriented x3   ECG: A fib 68 bpm     ASSESSMENT & PLAN: 1. 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   Echo LVEF severely reduced 15-20% . Plan to repeat CMRI.  NYHA II-NYHA III. Functional improvement.   Cotninue digoxn 0.125 mg daily.  GDMT  Diuretic- Continue lasix  40 mg as needed.  He has been taking daily  BB- Hold with recent low output .  Ace/ARB/ARNI- Hold off MRA- Continue spiro 12.5 mg daily.  He has been taking 25 mg daily. I will review BMET from PCP. May be able to continue 25 mg spiro daily if renal function  SGLT2i- Continue farxiga  10 mg daily   2. Aortic Stenosis Will follow up with Structural Heart Team for timing of TAVR. Delayed due to LV thrombus.  Needs another CMRI  3. CAD  Cath 02/15/2024 showed 100% prox to mid LAD lesion treated with Synergy DES, EF 25-35%.   - cardiac MRI 02/25/2024: LVEF 32%, RVEF  44%, mild AI, severe AS, moderate MR, LAD territory infarction unlikely to be viable, suspect very small 5mm LV apical thrombus.  - No chest pain. Continue apixaban  5 mg twice a day +Plavix  75 mg daily.+ Crestor  40 mg daily.   4. LV Thrombus  Continue eliquis  5 mg twice a day.   5. PAF EKG today - Back in A fib with controlled rate.   Continue eliquis  5 mg twice a day + Amio 200 mg daily  Patient is on amiodarone .  - Plan to follow amiodarone  screening per guidelines  -Check TSH, Free T3 Free T4 3 months after starting amiodarone .  - Will need yearly CXR, TSH Free T3 Free T4, LFTS, and eye exams.  - Discussed with patient. Asked PCP for copy of lab work.   6. CKD Stage IIIa Baseline ~ 1.5-1.7 Requested copy of lab work from his PCP.    I will update Dr Bruce Caper Follow up in 4 weeks. Dr Cleo Dace Cathaleen Korol NP-C  8:57 AM

## 2024-03-10 NOTE — Progress Notes (Signed)
 Called [facility] (PCP) and request labs drawn there recently be faxed to our office, they are aware and state they will fax them over within 1 business day.  Sherolyn Dixon, RN, BSN, [facility] Specialty Coordinator [facility]

## 2024-03-10 NOTE — Patient Instructions (Signed)
 Great to see you today!!!  Medication Changes:  Take Furosemide  40 mg Daily AS NEEDED FOR WEIGHT GAIN   Special Instructions // Education:  Do the following things EVERYDAY: Weigh yourself in the morning before breakfast. Write it down and keep it in a log. Take your medicines as prescribed Eat low salt foods--Limit salt (sodium) to 2000 mg per day.  Stay as active as you can everyday Limit all fluids for the day to less than 2 liters   Follow-Up in: 3-4 weeks   At the [facility], you and your health needs are our priority. We have a designated team specialized in the treatment of Heart Failure. This Care Team includes your primary Heart Failure Specialized Cardiologist (physician), Advanced Practice Providers (APPs- Physician Assistants and Nurse Practitioners), and Pharmacist who all work together to provide you with the care you need, when you need it.   You may see any of the following providers on your designated Care Team at your next follow up:  Dr. Jules Oar Dr. Peder Bourdon Dr. Alwin Baars Dr. Judyth Nunnery Nieves Bars, NP Ruddy Corral, [place] [facility] [place], [place] Dennise Fitz, NP [place] Lee, NP Luster Salters, PharmD   Please be sure to bring in all your medications bottles to every appointment.   Need to Contact Us :  If you have any questions or concerns before your next appointment please send us  a message through [place] or call our office at [phone].    TO LEAVE A MESSAGE FOR THE NURSE SELECT OPTION 2, PLEASE LEAVE A MESSAGE INCLUDING: YOUR NAME DATE OF BIRTH CALL BACK NUMBER REASON FOR CALL**this is important as we prioritize the call backs  YOU WILL RECEIVE A CALL BACK THE SAME DAY AS LONG AS YOU CALL BEFORE 4:00 PM

## 2024-03-16 ENCOUNTER — Telehealth (HOSPITAL_COMMUNITY): Payer: Self-pay | Admitting: Cardiology

## 2024-03-16 NOTE — Telephone Encounter (Signed)
 Patient called to report he is still taking spiro 25 mg daily vs 12.5 daily. Reports this was told to provider at [facility], would like to know if he should continue.   Per Amy Clegg,NP  Continue spiro 12.5 mg daily.  He has been taking 25 mg daily. I will review BMET from PCP. May be able to continue 25 mg spiro daily if renal function   02/29/24 Cr 1.90 BUN 38 K 4.6  03/09/24 Cr 2.04 BUN 39 K 4.5   Ok to continue spiro 25 daily

## 2024-03-17 ENCOUNTER — Other Ambulatory Visit (HOSPITAL_COMMUNITY): Payer: Self-pay | Admitting: Cardiology

## 2024-03-17 ENCOUNTER — Other Ambulatory Visit (HOSPITAL_COMMUNITY): Payer: Self-pay

## 2024-03-17 ENCOUNTER — Encounter (HOSPITAL_COMMUNITY): Payer: Self-pay

## 2024-03-17 MED ORDER — SPIRONOLACTONE 25 MG PO TABS
25.0000 mg | ORAL_TABLET | Freq: Every day | ORAL | 6 refills | Status: DC
  Filled 2024-08-15: qty 30, 30d supply, fill #0

## 2024-03-18 NOTE — Telephone Encounter (Signed)
 LMOM

## 2024-03-18 NOTE — Telephone Encounter (Signed)
 Returned call to patient and made him aware to continue full tablet of Spironolactone . Patient reports that he is not able to come into the office for labs until his appointment on 7/8 with Dr. Gardenia due to not having transportation. Patient would like to know if its okay to wait until then for labs. Will forward to provider for review.

## 2024-03-21 ENCOUNTER — Ambulatory Visit: Admitting: Cardiovascular Disease

## 2024-03-24 ENCOUNTER — Other Ambulatory Visit (HOSPITAL_COMMUNITY): Payer: Self-pay

## 2024-03-28 NOTE — Progress Notes (Incomplete)
 Structural Heart: Dr Verlin Elsa Fell  HF MD: Dr Gardenia  Chief Complaint: Heart Failure   HPI: Mark Meadows is a 73 y.o. male with a history of alcoholism, prior tobacco use, DM, HTN, CKD IIIa, iron  deficiency anemia, aortic valve stenosis (moderate on echo at [facility] 06/23), prior atrial fibrillation/flutter in setting of ETOH withdrawal (2019).   Admitted 02/15/24 with late presenting anterolateral STEMI s/p PCi/DES to LAD. Echo with EF 15-20%, LV thrombus, RV okay and low flow low gradient severe AS. Course c/b cardiogenic shock requiring inotrope support with milrinone . He was diuresed. GDMT limited by blood pressure and renal impairment. He later went into atrial fibrillation/flutter with controlled rate. Started on PO amiodarone . Decided against cardioversion until valvular heart disease treated. After discussion with structural heart team he was set up for outpatient visit to discuss TAVR. Not currently a candidate d/t LV thrombus (confirmed by cardiac MRI).  Received 1 u RBCs and IV iron  for iron  deficiency anemia. Also treated with colchicine  and prednisone  for gout flare.  Today he returns for post hospital follow up. Overall feeling fine. Denies PND/Orthopnea. No chest pain.  Gets a little short of breath with exertion. Better than before he went in the hosptal. Denies dizziness/syncope. Appetite ok. No fever or chills. Weight at home 205-207 pounds. Taking all medications. He has been taking lasix  daily and 25 mg spiro daily. Saw PCP yesterday and had labs drawn.   Current Outpatient Medications  Medication Sig Dispense Refill   allopurinol (ZYLOPRIM) 100 MG tablet Take 100 mg by mouth daily.     amiodarone  (PACERONE ) 200 MG tablet Take 1 tablet (200 mg total) by mouth daily. 30 tablet 5   apixaban  (ELIQUIS ) 5 MG TABS tablet Take 1 tablet (5 mg total) by mouth 2 (two) times daily. 60 tablet 5   Cholecalciferol (D3 5000 PO) Take 5,000 Units by mouth daily.     clopidogrel  (PLAVIX )  75 MG tablet Take 1 tablet (75 mg total) by mouth daily with breakfast. 30 tablet 5   dapagliflozin  propanediol (FARXIGA ) 10 MG TABS tablet Take 1 tablet (10 mg total) by mouth daily. 30 tablet 5   digoxin  (LANOXIN ) 0.125 MG tablet Take 1 tablet (0.125 mg total) by mouth daily. 30 tablet 5   furosemide  (LASIX ) 40 MG tablet Take 1 tablet by mouth as needed for weight gain, lower extremity edema, increased shortness of breath 30 tablet 2   levothyroxine  (SYNTHROID ) 150 MCG tablet Take 150-300 mcg by mouth See admin instructions. Take 150mcg (1 tablet) by mouth 5 days a week, then take 300mcg (2 tablets) 2 days a week.     MAGNESIUM  PO Take 1 tablet by mouth daily.     pantoprazole  (PROTONIX ) 40 MG tablet Take 1 tablet (40 mg total) by mouth daily. 30 tablet 5   rosuvastatin  (CRESTOR ) 40 MG tablet Take 1 tablet (40 mg total) by mouth daily. 30 tablet 5   spironolactone  (ALDACTONE ) 25 MG tablet Take 1 tablet (25 mg total) by mouth daily. 30 tablet 6   tamsulosin  (FLOMAX ) 0.4 MG CAPS capsule Take 0.4 mg by mouth.     No current facility-administered medications for this visit.    There were no vitals filed for this visit.  Wt Readings from Last 3 Encounters:  03/10/24 94.3 kg (208 lb)  02/29/24 95 kg (209 lb 7 oz)  10/05/17 99.8 kg (220 lb)    PHYSICAL EXAM: There were no vitals filed for this visit. GENERAL: NAD Lungs- *** CARDIAC:  JVP: *** cm          Normal rate with regular rhythm. *** murmur.  Pulses ***. *** edema.  ABDOMEN: Soft, non-tender, non-distended.  EXTREMITIES: Warm and well perfused.  NEUROLOGIC: No obvious FND   ECG: A fib 68 bpm     ASSESSMENT & PLAN: 1. 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   Echo LVEF severely reduced 15-20% . Plan to repeat CMRI.  NYHA II-NYHA III. Functional improvement.   Cotninue digoxn 0.125 mg daily.  GDMT  Diuretic- Continue lasix  40 mg as needed.  He has been taking daily  BB- Hold with recent low  output .  Ace/ARB/ARNI- Hold off MRA- Continue spiro 12.5 mg daily.  He has been taking 25 mg daily. I will review BMET from PCP. May be able to continue 25 mg spiro daily if renal function  SGLT2i- Continue farxiga  10 mg daily   2. Aortic Stenosis Will follow up with Structural Heart Team for timing of TAVR. Delayed due to LV thrombus.  Needs another CMRI  3. CAD  Cath 02/15/2024 showed 100% prox to mid LAD lesion treated with Synergy DES, EF 25-35%.   - cardiac MRI 02/25/2024: LVEF 32%, RVEF 44%, mild AI, severe AS, moderate MR, LAD territory infarction unlikely to be viable, suspect very small 5mm LV apical thrombus.  - No chest pain. Continue apixaban  5 mg twice a day +Plavix  75 mg daily.+ Crestor  40 mg daily.   4. LV Thrombus  Continue eliquis  5 mg twice a day.   5. PAF EKG today - Back in A fib with controlled rate.   Continue eliquis  5 mg twice a day + Amio 200 mg daily  Patient is on amiodarone .  - Plan to follow amiodarone  screening per guidelines  -Check TSH, Free T3 Free T4 3 months after starting amiodarone .  - Will need yearly CXR, TSH Free T3 Free T4, LFTS, and eye exams.  - Discussed with patient. Asked PCP for copy of lab work.   6. CKD Stage IIIa Baseline ~ 1.5-1.7 Requested copy of lab work from his PCP.     Nahiara Kretzschmar DO 3:20 PM

## 2024-03-29 ENCOUNTER — Ambulatory Visit (HOSPITAL_COMMUNITY)
Admission: RE | Admit: 2024-03-29 | Discharge: 2024-03-29 | Disposition: A | Discharge status: Home / Self Care | Source: Ambulatory Visit | Attending: Cardiology | Admitting: Cardiology

## 2024-03-29 ENCOUNTER — Encounter (HOSPITAL_COMMUNITY): Payer: Self-pay | Admitting: Cardiology

## 2024-03-29 VITALS — BP 110/64 | HR 62

## 2024-03-29 DIAGNOSIS — I130 Hypertensive heart and chronic kidney disease with heart failure and stage 1 through stage 4 chronic kidney disease, or unspecified chronic kidney disease: Secondary | ICD-10-CM | POA: Diagnosis not present

## 2024-03-29 DIAGNOSIS — I480 Paroxysmal atrial fibrillation: Secondary | ICD-10-CM | POA: Diagnosis not present

## 2024-03-29 DIAGNOSIS — I2102 ST elevation (STEMI) myocardial infarction involving left anterior descending coronary artery: Secondary | ICD-10-CM

## 2024-03-29 DIAGNOSIS — Z7902 Long term (current) use of antithrombotics/antiplatelets: Secondary | ICD-10-CM | POA: Insufficient documentation

## 2024-03-29 DIAGNOSIS — I252 Old myocardial infarction: Secondary | ICD-10-CM | POA: Diagnosis not present

## 2024-03-29 DIAGNOSIS — Z7901 Long term (current) use of anticoagulants: Secondary | ICD-10-CM | POA: Insufficient documentation

## 2024-03-29 DIAGNOSIS — I080 Rheumatic disorders of both mitral and aortic valves: Secondary | ICD-10-CM | POA: Insufficient documentation

## 2024-03-29 DIAGNOSIS — I350 Nonrheumatic aortic (valve) stenosis: Secondary | ICD-10-CM

## 2024-03-29 DIAGNOSIS — I5022 Chronic systolic (congestive) heart failure: Secondary | ICD-10-CM | POA: Insufficient documentation

## 2024-03-29 DIAGNOSIS — N1831 Chronic kidney disease, stage 3a: Secondary | ICD-10-CM | POA: Diagnosis not present

## 2024-03-29 DIAGNOSIS — M1000 Idiopathic gout, unspecified site: Secondary | ICD-10-CM | POA: Diagnosis not present

## 2024-03-29 DIAGNOSIS — M109 Gout, unspecified: Secondary | ICD-10-CM | POA: Insufficient documentation

## 2024-03-29 DIAGNOSIS — I4891 Unspecified atrial fibrillation: Secondary | ICD-10-CM

## 2024-03-29 DIAGNOSIS — I2510 Atherosclerotic heart disease of native coronary artery without angina pectoris: Secondary | ICD-10-CM | POA: Insufficient documentation

## 2024-03-29 DIAGNOSIS — I513 Intracardiac thrombosis, not elsewhere classified: Secondary | ICD-10-CM

## 2024-03-29 DIAGNOSIS — I255 Ischemic cardiomyopathy: Secondary | ICD-10-CM | POA: Insufficient documentation

## 2024-03-29 DIAGNOSIS — Z79899 Other long term (current) drug therapy: Secondary | ICD-10-CM | POA: Diagnosis not present

## 2024-03-29 DIAGNOSIS — E1122 Type 2 diabetes mellitus with diabetic chronic kidney disease: Secondary | ICD-10-CM | POA: Insufficient documentation

## 2024-03-29 LAB — BASIC METABOLIC PANEL WITH GFR
Anion gap: 12 (ref 5–15)
BUN: 23 mg/dL (ref 8–23)
CO2: 21 mmol/L — ABNORMAL LOW (ref 22–32)
Calcium: 9.6 mg/dL (ref 8.9–10.3)
Chloride: 104 mmol/L (ref 98–111)
Creatinine, Ser: 1.84 mg/dL — ABNORMAL HIGH (ref 0.61–1.24)
GFR, Estimated: 38 mL/min — ABNORMAL LOW (ref >60)
Glucose, Bld: 134 mg/dL — ABNORMAL HIGH (ref 70–99)
Potassium: 4.6 mmol/L (ref 3.5–5.1)
Sodium: 137 mmol/L (ref 135–145)

## 2024-03-29 LAB — URIC ACID: Uric Acid, Serum: 5.4 mg/dL (ref 3.7–8.6)

## 2024-03-29 LAB — BRAIN NATRIURETIC PEPTIDE: B Natriuretic Peptide: 415.3 pg/mL — ABNORMAL HIGH (ref 0.0–100.0)

## 2024-03-29 MED ORDER — COLCHICINE 0.6 MG PO TABS: 0.6000 mg | ORAL_TABLET | Freq: Every day | ORAL | 0 refills | Status: DC

## 2024-03-29 MED ORDER — CLOPIDOGREL BISULFATE 75 MG PO TABS: 75.0000 mg | ORAL_TABLET | Freq: Every day | ORAL | 5 refills | Status: AC

## 2024-03-29 NOTE — Patient Instructions (Addendum)
 Medication Changes:  RESTART PLAVIX  (CLOPIDOGREL ) 75MG  ONCE DAILY   START COLCHICINE  1.2MG  (2) TABLETS TODAY THEN TAKE 0.6MG  (1) TABLET FOR THREE DAYS   Lab Work:  Labs done today, your results will be available in MyChart, we will contact you for abnormal readings.   Referrals:  YOU HAVE BEEN REFERRED TO RHEUMATOLOGY THEY WILL REACH OUT TO YOU OR CALL TO ARRANGE THIS. PLEASE CALL US  WITH ANY CONCERNS   Follow-Up in: 3 MONTHS WITH DR. GARDENIA   CARDIAC MRI APPOINTMENT ON 8/6 AT 8AM----HERE AT [facility]   DR. RICARDO OFFICE APPOINTMENT ON 8/18 AT 1120AM---AT [address] OFFICE   At the [facility], you and your health needs are our priority. We have a designated team specialized in the treatment of Heart Failure. This Care Team includes your primary Heart Failure Specialized Cardiologist (physician), Advanced Practice Providers (APPs- Physician Assistants and Nurse Practitioners), and Pharmacist who all work together to provide you with the care you need, when you need it.   You may see any of the following providers on your designated Care Team at your next follow up:  Dr. Toribio Fuel Dr. Ezra Shuck Dr. Ria GARDENIA Dr. Odis Brownie Greig Mosses, NP Caffie Shed, [place] [facility] [place], [place] Beckey Coe, NP [place] Lee, NP Tinnie Redman, PharmD   Please be sure to bring in all your medications bottles to every appointment.   Need to Contact Us :  If you have any questions or concerns before your next appointment please send us  a message through [place] or call our office at [phone].    TO LEAVE A MESSAGE FOR THE NURSE SELECT OPTION 2, PLEASE LEAVE A MESSAGE INCLUDING: YOUR NAME DATE OF BIRTH CALL BACK NUMBER REASON FOR CALL**this is important as we prioritize the call backs  YOU WILL RECEIVE A CALL BACK THE SAME DAY AS LONG AS YOU CALL BEFORE 4:00 PM

## 2024-03-31 NOTE — Progress Notes (Deleted)
 Office Visit Note  Patient: Mark Meadows             Date of Birth: 1951/04/19           MRN: [id number]             PCP: Haze Kingfisher, MD Referring: Gardenia Led, DO Visit Date: 04/05/2024 Occupation: @GUAROCC @  Subjective:  No chief complaint on file.   History of Present Illness: Magnum Lunde is a 73 y.o. male ***     Activities of Daily Living:  Patient reports morning stiffness for *** {minute/hour:19697}.   Patient {ACTIONS;DENIES/REPORTS:21021675::Denies} nocturnal pain.  Difficulty dressing/grooming: {ACTIONS;DENIES/REPORTS:21021675::Denies} Difficulty climbing stairs: {ACTIONS;DENIES/REPORTS:21021675::Denies} Difficulty getting out of chair: {ACTIONS;DENIES/REPORTS:21021675::Denies} Difficulty using hands for taps, buttons, cutlery, and/or writing: {ACTIONS;DENIES/REPORTS:21021675::Denies}  No Rheumatology ROS completed.   PMFS History:  Patient Active Problem List   Diagnosis Date Noted   Acute systolic heart failure (HCC) 02/16/2024   Pressure injury of skin 02/16/2024   STEMI involving left anterior descending coronary artery (HCC) 02/15/2024   Atrial flutter (HCC) 10/05/2017   Cognitive impairment 10/05/2017   Hypomagnesemia 10/05/2017   Alcohol abuse    Delirium tremens (HCC)    Alcohol withdrawal (HCC) 10/02/2017   Tachycardia    Hematemesis    Macrocytic anemia with vitamin B12 deficiency 09/25/2016   Severe sepsis (HCC) 09/23/2016   Bacteremia 09/22/2016   Acute encephalopathy    Palliative care encounter    Goals of care, counseling/discussion    Elevated troponin 09/17/2016   Prolonged Q-T interval on ECG 09/03/2016   Non-traumatic rhabdomyolysis 09/03/2016   Effusion of left knee 09/03/2016   Weakness 09/02/2016   A-fib (HCC), possible 09/02/2016   Hematochezia    Rectal bleeding 08/22/2016   Hypothyroidism 08/22/2016   Alcohol use disorder, severe, dependence (HCC) 08/22/2016   Alcoholic liver disease (HCC)  87/98/7982   Hypokalemia 08/22/2016   Hyperglycemia 08/22/2016   Thrombocytopenia (HCC) 08/22/2016   Acute blood loss anemia 08/22/2016   Hypertension     Past Medical History:  Diagnosis Date   Acute blood loss anemia    Alcohol dependence (HCC)    Alcoholic hepatitis    Dysrhythmia    a. 08/2016 -?afib, not clear. b. 09/2017 - official dx atrial flutter.   E. coli UTI 09/22/2016   GERD (gastroesophageal reflux disease)    Gout    Hematochezia    Hyperglycemia    Hypertension    Hypokalemia    Hypothyroidism    Thrombocytopenia (HCC)     Family History  Problem Relation Age of Onset   Thyroid disease Mother    Heart disease Father    Past Surgical History:  Procedure Laterality Date   COLONOSCOPY N/A 08/26/2016   Procedure: COLONOSCOPY;  Surgeon: Gwendlyn ONEIDA Buddy, MD;  Location: [facility] ENDOSCOPY;  Service: Endoscopy;  Laterality: N/A;   CORONARY/GRAFT ACUTE MI REVASCULARIZATION N/A 02/15/2024   Procedure: Coronary/Graft Acute MI Revascularization;  Surgeon: Wonda Sharper, MD;  Location: [facility] INVASIVE CV LAB;  Service: Cardiovascular;  Laterality: N/A;   LEFT HEART CATH AND CORONARY ANGIOGRAPHY N/A 02/15/2024   Procedure: LEFT HEART CATH AND CORONARY ANGIOGRAPHY;  Surgeon: Wonda Sharper, MD;  Location: [facility] INVASIVE CV LAB;  Service: Cardiovascular;  Laterality: N/A;   RIGHT HEART CATH N/A 02/22/2024   Procedure: RIGHT HEART CATH;  Surgeon: Rolan Ezra RAMAN, MD;  Location[facility] INVASIVE CV LAB;  Service: Cardiovascular;  Laterality: N/A;   TONSILLECTOMY  1957   Social History   Social History Narrative   Not  on file   Immunization History  Administered Date(s) Administered   Influenza Split 07/20/2015   Influenza, High Dose Seasonal PF 07/20/2015, 10/24/2015   Influenza, Seasonal, Injecte, Preservative Fre 10/24/2015   Influenza-Unspecified 06/22/2012   PFIZER(Purple Top)SARS-COV-2 Vaccination 02/01/2020, 08/14/2020   Pfizer Covid-19 Vaccine Bivalent Booster 17yrs & up  07/28/2021   Pfizer(Comirnaty)Fall Seasonal Vaccine 12 years and older 07/01/2022   Pneumococcal Polysaccharide-23 09/06/2015   Tdap 09/22/2008   Zoster, Live 09/06/2015     Objective: Vital Signs: There were no vitals taken for this visit.   Physical Exam   Musculoskeletal Exam: ***  CDAI Exam: CDAI Score: -- Patient Global: --; Provider Global: -- Swollen: --; Tender: -- Joint Exam 04/05/2024   No joint exam has been documented for this visit   There is currently no information documented on the homunculus. Go to the Rheumatology activity and complete the homunculus joint exam.  Investigation: No additional findings.  Imaging: No results found.  Recent Labs: Lab Results  Component Value Date   WBC 16.1 (H) 02/29/2024   HGB 9.6 (L) 02/29/2024   PLT 345 02/29/2024   NA 137 03/29/2024   K 4.6 03/29/2024   CL 104 03/29/2024   CO2 21 (L) 03/29/2024   GLUCOSE 134 (H) 03/29/2024   BUN 23 03/29/2024   CREATININE 1.84 (H) 03/29/2024   BILITOT 1.1 02/15/2024   ALKPHOS 56 02/15/2024   AST 115 (H) 02/15/2024   ALT 21 02/15/2024   PROT 7.8 02/15/2024   ALBUMIN 3.2 (L) 02/15/2024   CALCIUM  9.6 03/29/2024   GFRAA >60 10/07/2017    Speciality Comments: No specialty comments available.  Procedures:  No procedures performed Allergies: Patient has no known allergies.   Assessment / Plan:     Visit Diagnoses: Idiopathic chronic gout of multiple sites without tophus - Allopurinol 100 mg daily and colchicine  PRN  Acute systolic heart failure (HCC)  Atypical atrial flutter (HCC)  Primary hypertension  STEMI involving left anterior descending coronary artery (HCC)  Alcoholic liver disease (HCC)  Acute encephalopathy  Delirium tremens (HCC)  Effusion of left knee  Non-traumatic rhabdomyolysis  Thrombocytopenia (HCC)  Alcohol use disorder, severe, dependence (HCC)  Macrocytic anemia with vitamin B12 deficiency  Prolonged Q-T interval on ECG  Severe  sepsis (HCC)  History of hypothyroidism  Orders: No orders of the defined types were placed in this encounter.  No orders of the defined types were placed in this encounter.   Face-to-face time spent with patient was *** minutes. Greater than 50% of time was spent in counseling and coordination of care.  Follow-Up Instructions: No follow-ups on file.   Waddell CHRISTELLA Craze, PA-C  Note - This record has been created using Dragon software.  Chart creation errors have been sought, but may not always  have been located. Such creation errors do not reflect on  the standard of medical care.

## 2024-04-05 ENCOUNTER — Encounter: Admitting: Physician Assistant

## 2024-04-05 DIAGNOSIS — M1A09X0 Idiopathic chronic gout, multiple sites, without tophus (tophi): Secondary | ICD-10-CM

## 2024-04-05 DIAGNOSIS — I10 Essential (primary) hypertension: Secondary | ICD-10-CM

## 2024-04-05 DIAGNOSIS — M6282 Rhabdomyolysis: Secondary | ICD-10-CM

## 2024-04-05 DIAGNOSIS — D518 Other vitamin B12 deficiency anemias: Secondary | ICD-10-CM

## 2024-04-05 DIAGNOSIS — D696 Thrombocytopenia, unspecified: Secondary | ICD-10-CM

## 2024-04-05 DIAGNOSIS — R9431 Abnormal electrocardiogram [ECG] [EKG]: Secondary | ICD-10-CM

## 2024-04-05 DIAGNOSIS — I5021 Acute systolic (congestive) heart failure: Secondary | ICD-10-CM

## 2024-04-05 DIAGNOSIS — F10931 Alcohol use, unspecified with withdrawal delirium: Secondary | ICD-10-CM

## 2024-04-05 DIAGNOSIS — Z8639 Personal history of other endocrine, nutritional and metabolic disease: Secondary | ICD-10-CM

## 2024-04-05 DIAGNOSIS — G9340 Encephalopathy, unspecified: Secondary | ICD-10-CM

## 2024-04-05 DIAGNOSIS — F1020 Alcohol dependence, uncomplicated: Secondary | ICD-10-CM

## 2024-04-05 DIAGNOSIS — M25462 Effusion, left knee: Secondary | ICD-10-CM

## 2024-04-05 DIAGNOSIS — A419 Sepsis, unspecified organism: Secondary | ICD-10-CM

## 2024-04-05 DIAGNOSIS — I484 Atypical atrial flutter: Secondary | ICD-10-CM

## 2024-04-05 DIAGNOSIS — I2102 ST elevation (STEMI) myocardial infarction involving left anterior descending coronary artery: Secondary | ICD-10-CM

## 2024-04-05 DIAGNOSIS — K709 Alcoholic liver disease, unspecified: Secondary | ICD-10-CM

## 2024-04-11 ENCOUNTER — Telehealth (HOSPITAL_COMMUNITY): Payer: Self-pay | Admitting: Pharmacy Technician

## 2024-04-11 ENCOUNTER — Other Ambulatory Visit (HOSPITAL_COMMUNITY): Payer: Self-pay

## 2024-04-11 NOTE — Telephone Encounter (Signed)
 Advanced Heart Failure Patient Advocate Encounter  Patient left message with triage regarding Eliquis . Called and left patient vm, requested call back to discuss assistance options.  Almarie JULIANNA Pa, CPhT

## 2024-04-12 ENCOUNTER — Other Ambulatory Visit (HOSPITAL_COMMUNITY): Payer: Self-pay

## 2024-04-12 ENCOUNTER — Other Ambulatory Visit: Payer: Self-pay

## 2024-04-12 MED ORDER — DAPAGLIFLOZIN PROPANEDIOL 10 MG PO TABS
10.0000 mg | ORAL_TABLET | Freq: Every day | ORAL | 3 refills | Status: DC
  Filled 2024-04-12: qty 90, 90d supply, fill #0

## 2024-04-12 MED ORDER — DAPAGLIFLOZIN PROPANEDIOL 10 MG PO TABS: 10.0000 mg | ORAL_TABLET | Freq: Every day | ORAL | Status: DC

## 2024-04-12 NOTE — Telephone Encounter (Signed)
 Advanced Heart Failure Patient Advocate Encounter  The patient was approved for a [employer] grant that will help cover the cost of Farxiga , Spironolactone . Total amount awarded, $4,500. Eligibility, 03/13/24 - 03/12/25.  ID [id number]  BIN [id number]  PCN [id number]  Group [id number]  Mark JULIANNA Pa, CPhT

## 2024-04-12 NOTE — Telephone Encounter (Signed)
 Advanced Heart Failure Patient Advocate Encounter  Spoke with patient. We are going to obtain a grant for Farxiga , in the hopes that it will meet his deductible, lowering the cost of the Eliquis . Will mail Farxiga  from [facility]. He would prefer to pick up Eliquis  from local pharmacy since he took his last pill last night.   Called [facility] and provided verbal Farxiga  script. The grant covered the cost of Farxiga  ($396). Eliquis  is now $47 for 30 days and $141 for 90.  Almarie JULIANNA Pa, CPhT

## 2024-04-13 ENCOUNTER — Other Ambulatory Visit (HOSPITAL_COMMUNITY): Payer: Self-pay

## 2024-04-13 ENCOUNTER — Other Ambulatory Visit: Payer: Self-pay

## 2024-04-13 ENCOUNTER — Other Ambulatory Visit (HOSPITAL_COMMUNITY): Payer: Self-pay | Admitting: Pharmacist

## 2024-04-13 ENCOUNTER — Telehealth (HOSPITAL_COMMUNITY): Payer: Self-pay

## 2024-04-13 MED ORDER — DAPAGLIFLOZIN PROPANEDIOL 10 MG PO TABS
10.0000 mg | ORAL_TABLET | Freq: Every day | ORAL | 3 refills | Status: AC
  Filled 2024-04-13: qty 90, 90d supply, fill #0
  Filled 2024-08-05 – 2024-09-19 (×2): qty 30, 30d supply, fill #1
  Filled 2024-10-17: qty 30, 30d supply, fill #2

## 2024-04-13 NOTE — Telephone Encounter (Signed)
 Called patient to schedule cardiac rehab, patient states he is not interested in the program. Informed patient if he changes he mind he can call us  back to reopen the referral.  Closing referral.

## 2024-04-14 ENCOUNTER — Other Ambulatory Visit (HOSPITAL_COMMUNITY): Payer: Self-pay

## 2024-04-25 ENCOUNTER — Encounter (HOSPITAL_COMMUNITY): Payer: Self-pay

## 2024-04-27 ENCOUNTER — Other Ambulatory Visit: Payer: Self-pay | Admitting: Cardiovascular Disease

## 2024-04-27 ENCOUNTER — Ambulatory Visit (HOSPITAL_COMMUNITY)
Admission: RE | Admit: 2024-04-27 | Discharge: 2024-04-27 | Disposition: A | Discharge status: Home / Self Care | Source: Ambulatory Visit | Attending: Cardiovascular Disease | Admitting: Cardiovascular Disease

## 2024-04-27 DIAGNOSIS — I513 Intracardiac thrombosis, not elsewhere classified: Secondary | ICD-10-CM

## 2024-04-27 DIAGNOSIS — I359 Nonrheumatic aortic valve disorder, unspecified: Secondary | ICD-10-CM

## 2024-04-27 MED ORDER — GADOBUTROL 1 MMOL/ML IV SOLN
10.0000 mL | Freq: Once | INTRAVENOUS | Status: AC | PRN
  Administered 2024-04-27: 10 mL via INTRAVENOUS

## 2024-05-02 ENCOUNTER — Ambulatory Visit: Payer: Self-pay | Admitting: Cardiovascular Disease

## 2024-05-09 ENCOUNTER — Encounter: Payer: Self-pay | Admitting: Cardiovascular Disease

## 2024-05-09 ENCOUNTER — Telehealth: Payer: Self-pay

## 2024-05-09 ENCOUNTER — Ambulatory Visit: Attending: Cardiovascular Disease | Admitting: Cardiovascular Disease

## 2024-05-09 VITALS — BP 102/60 | HR 60 | Ht 69.0 in | Wt 210.0 lb

## 2024-05-09 DIAGNOSIS — I2510 Atherosclerotic heart disease of native coronary artery without angina pectoris: Secondary | ICD-10-CM

## 2024-05-09 DIAGNOSIS — I5021 Acute systolic (congestive) heart failure: Secondary | ICD-10-CM | POA: Diagnosis not present

## 2024-05-09 DIAGNOSIS — I350 Nonrheumatic aortic (valve) stenosis: Secondary | ICD-10-CM

## 2024-05-09 DIAGNOSIS — I513 Intracardiac thrombosis, not elsewhere classified: Secondary | ICD-10-CM

## 2024-05-09 NOTE — Assessment & Plan Note (Signed)
 Followed by the advanced heart failure team.  Cardiac MRI results reviewed with LVEF of 37%.  Patient on appropriate GDMT with spironolactone , furosemide  as needed, and digoxin .

## 2024-05-09 NOTE — Progress Notes (Signed)
 Cardiology Office Note:    Date:  05/09/2024   ID:  Mark Meadows, DOB 1951-01-05, MRN [id number]  PCP:  Haze Kingfisher, MD   [facility] Providers Cardiologist:  Ezra Shuck, MD     Referring MD: Haze Kingfisher, MD   Chief Complaint  Patient presents with   Aortic Stenosis    History of Present Illness:    Mark Meadows is a 73 y.o. male with a hx of CAD s/p STEMI in May 2025, complicated by cardiogenic shock and LV thrombus. Comorbidities include alcoholism (with Etoh cessation), Type II DM, HTN, and HFrEF. He was found to have severe aortic stenosis during his hospitalization with STEMI, but TAVR was initially deferred because of the presence of LV thrombus.  Since I last saw the patient in the hospital, he has followed up with the advanced heart failure team.  He is felt to show continued improvement but remains limited by NYHA functional class III symptoms.  Follow-up MRI April 27, 2024 showed mild LV dilatation with moderate LV systolic dysfunction, LVEF 37%, and resolution of LV apical thrombus.  The patient's gated cardiac CTA showed a aortic valve calcium  score of 2590 with large sinuses averaging about 37 to 38 mm in diameter and an aortic valve annular area of 553 mm, suitable for a 29 mm Edwards SAPIEN 3 valve.  The patient is here alone today.  He lives in [place], [place] .  The patient is a widower and he really has no family support.  He was able to drive himself here today.  He he has been stable from a cardiac perspective and reports that his biggest problem of late has been gout in his right knee.  This is prevented him from doing much walking.  He does report exertional dyspnea with a [place]  Heart Association functional class II-III limitation.  He has had no chest pain or pressure.  He has been compliant with his medications.  He is able to care for his animals at home.  He denies orthopnea, PND, or syncope.  The patient reports dental problems  and he underwent an orthopantogram when he was hospitalized demonstrating multiple dental caries with periapical abscesses.  He currently denies any pain in his teeth or gums and is not having any symptomatic problems.  Current Medications: Current Meds  Medication Sig   allopurinol (ZYLOPRIM) 100 MG tablet Take 100 mg by mouth daily.   amiodarone  (PACERONE ) 200 MG tablet Take 1 tablet (200 mg total) by mouth daily.   apixaban  (ELIQUIS ) 5 MG TABS tablet Take 1 tablet (5 mg total) by mouth 2 (two) times daily.   clopidogrel  (PLAVIX ) 75 MG tablet Take 1 tablet (75 mg total) by mouth daily with breakfast.   colchicine  0.6 MG tablet Take 1 tablet (0.6 mg total) by mouth daily. TAKE 1.2MG  (2) TABLETS TODAY, THEN 0.6MG  (1) TABLET FOR 3 DAYS   dapagliflozin  propanediol (FARXIGA ) 10 MG TABS tablet Take 1 tablet (10 mg total) by mouth daily.   digoxin  (LANOXIN ) 0.125 MG tablet Take 1 tablet (0.125 mg total) by mouth daily.   furosemide  (LASIX ) 40 MG tablet Take 1 tablet by mouth as needed for weight gain, lower extremity edema, increased shortness of breath   levothyroxine  (SYNTHROID ) 150 MCG tablet Take 150-300 mcg by mouth See admin instructions. Take 150mcg (1 tablet) by mouth 5 days a week, then take 300mcg (2 tablets) 2 days a week.   pantoprazole  (PROTONIX ) 40 MG tablet Take 1 tablet (40 mg total)  by mouth daily.   rosuvastatin  (CRESTOR ) 40 MG tablet Take 1 tablet (40 mg total) by mouth daily.   spironolactone  (ALDACTONE ) 25 MG tablet Take 1 tablet (25 mg total) by mouth daily.   tamsulosin  (FLOMAX ) 0.4 MG CAPS capsule Take 0.4 mg by mouth.     Allergies:   Patient has no known allergies.   ROS:   Please see the history of present illness.    All other systems reviewed and are negative.  EKGs/Labs/Other Studies Reviewed:    The following studies were reviewed today: Cardiac Studies & Procedures    ______________________________________________________________________________________________ CARDIAC CATHETERIZATION  CARDIAC CATHETERIZATION 02/22/2024  Conclusion 1. Elevated right and left heart filling pressures. 2. Preserved CO by Fick though low by thermodilution. 3. Moderate pulmonary venous hypertension. 4. Adequate PAPi  Continue to diuresed, will leave off milrinone  for now.   CARDIAC CATHETERIZATION 02/15/2024  Conclusion   Prox LAD to Mid LAD lesion is 100% stenosed.   A drug-eluting stent was successfully placed using a STENT SYNERGY XD 3.0X16.   Post intervention, there is a 0% residual stenosis.   LV end diastolic pressure is severely elevated.   The left ventricular ejection fraction is 25-35% by visual estimate.   Recommend uninterrupted dual antiplatelet therapy with Aspirin  81mg  daily and Clopidogrel  75mg  daily for a minimum of 12 months (ACS-Class I recommendation).  1.  Acute, late presenting STEMI involving the LAD, treated successfully with PCI using PTCA followed by stenting of the proximal LAD with baseline TIMI 0 flow, restored to TIMI-3 flow at the completion of the procedure 2.  Mild nonobstructive plaquing in the left main, left circumflex, and RCA with a heavily calcified RCA 3.  Severe segmental LV dysfunction with akinesis of the entire anterolateral wall, apex, and inferoapex, LVEF estimated at approximately 25 to 30% 4.  Angiographic findings on ventriculography suspicious for LV apical thrombus 5.  Severely elevated LVEDP  Recommendations: DAPT with aspirin  and clopidogrel  minimum of 12 months, check 2D echo with contrast to evaluate for LV apical thrombus and accurately assess LVEF, post MI medical therapy, IV diuresis with furosemide  in the setting of high LVEDP (acute systolic heart failure secondary to acute anterior infarct).  Institute GDMT as tolerated.  Findings Coronary Findings Diagnostic  Dominance: Right  Left Main The vessel  exhibits minimal luminal irregularities. The vessel is calcified. Left main is calcified with no stenosis.  Left Anterior Descending Prox LAD to Mid LAD lesion is 100% stenosed.  Left Circumflex The vessel exhibits minimal luminal irregularities. The circumflex is patent throughout.  The vessel has minimal nonobstructive disease.  Right Coronary Artery Vessel is large. There is mild diffuse disease throughout the vessel. Heavily calcified vessel.  The RCA is dominant and supplies a large PDA branch.  The vessel is patent throughout its course with no significant obstructive disease.  The vessel is heavily calcified with mild plaquing.  Intervention  Prox LAD to Mid LAD lesion Stent CATH VISTA GUIDE 6FR [id number] guide catheter was inserted. Lesion crossed with guidewire using a WIRE HI TORQ WHISPER MS 190CM. Pre-stent angioplasty was performed using a BALLOON SAPPHIRE NC24 3.0X15. Maximum pressure:  12 atm. A drug-eluting stent was successfully placed using a STENT SYNERGY XD 3.0X16. Post-stent angioplasty was performed using a BALLOON [place] EMERGE MR [id number]. Maximum pressure:  16 atm. Post-Intervention Lesion Assessment The intervention was successful. Pre-interventional TIMI flow is 0. Post-intervention TIMI flow is 3. No complications occurred at this lesion. There is a 0% residual stenosis post  intervention.     ECHOCARDIOGRAM  ECHOCARDIOGRAM COMPLETE 02/16/2024  Narrative ECHOCARDIOGRAM REPORT    Patient Name:   Darwin Yeh Date of Exam: 02/16/2024 Medical Rec #:  [id number]    Height:       68.0 in Accession #:    [id number]   Weight:       224.4 lb Date of Birth:  17-May-1951    BSA:          2.146 m Patient Age:    73 years     BP:           115/86 mmHg Patient Gender: M            HR:           107 bpm. Exam Location:  Inpatient  Procedure: 2D Echo, Cardiac Doppler and Color Doppler (Both Spectral and Color Flow Doppler were utilized during procedure).  Indications:      I50.21 Acute systolic (congestive) heart failure  History:         Patient has prior history of Echocardiogram examinations, most recent 10/06/2017.  Sonographer:     Eva Lash Referring Phys:  [phone] Derreon Consalvo Diagnosing Phys: Darryle Decent MD  IMPRESSIONS   1. Akinesis of the mid septum into apex and inferior septum consistent with LAD infarction. LVEF 15-20%. Cannot exclude a layered LV thrombus on contrast imaging. Consider cardiac MRI for definitive evaluation. LVOT VTI 9 cm. CO estimated 1.8 L/min and CI 0.8 L/min/m2, concerning for low output state. Left ventricular ejection fraction, by estimation, is 15-20%. The left ventricle has severely decreased function. The left ventricle demonstrates regional wall motion abnormalities (see scoring diagram/findings for description). There is moderate concentric left ventricular hypertrophy. Indeterminate diastolic filling due to E-A fusion. 2. Severely calcified aortic valve with restricted leaflet movement. Vmax 2.6 m/s, MG 16 mmHG, AVA 0.81 cm2, DI 0.24. This is concerning for low flow low gradient severe aortic stenosis. Recommend clinical correlation. The aortic valve is calcified. Aortic valve regurgitation is trivial. Moderate to severe aortic valve stenosis. 3. Right ventricular systolic function is normal. The right ventricular size is mildly enlarged. Tricuspid regurgitation signal is inadequate for assessing PA pressure. 4. The mitral valve is degenerative. Mild mitral valve regurgitation. No evidence of mitral stenosis. 5. The inferior vena cava is dilated in size with >50% respiratory variability, suggesting right atrial pressure of 8 mmHg.  FINDINGS Left Ventricle: Akinesis of the mid septum into apex and inferior septum consistent with LAD infarction. LVEF 15-20%. Cannot exclude a layered LV thrombus on contrast imaging. Consider cardiac MRI for definitive evaluation. LVOT VTI 9 cm. CO estimated 1.8 L/min and CI 0.8 L/min/m2,  concerning for low output state. Left ventricular ejection fraction, by estimation, is 15-20%. The left ventricle has severely decreased function. The left ventricle demonstrates regional wall motion abnormalities. Definity  contrast agent was given IV to delineate the left ventricular endocardial borders. Strain was performed and the global longitudinal strain is indeterminate. The left ventricular internal cavity size was normal in size. There is moderate concentric left ventricular hypertrophy. Indeterminate diastolic filling due to E-A fusion.   LV Wall Scoring: The mid inferoseptal segment, apical septal segment, apical anterior segment, apical inferior segment, and apex are akinetic.  Right Ventricle: The right ventricular size is mildly enlarged. No increase in right ventricular wall thickness. Right ventricular systolic function is normal. Tricuspid regurgitation signal is inadequate for assessing PA pressure.  Left Atrium: Left atrial size was normal in size.  Right Atrium:  Right atrial size was normal in size.  Pericardium: There is no evidence of pericardial effusion.  Mitral Valve: The mitral valve is degenerative in appearance. Mild mitral valve regurgitation. No evidence of mitral valve stenosis. MV peak gradient, 3.9 mmHg. The mean mitral valve gradient is 2.0 mmHg.  Tricuspid Valve: The tricuspid valve is grossly normal. Tricuspid valve regurgitation is mild . No evidence of tricuspid stenosis.  Aortic Valve: Severely calcified aortic valve with restricted leaflet movement. Vmax 2.6 m/s, MG 16 mmHG, AVA 0.81 cm2, DI 0.24. This is concerning for low flow low gradient severe aortic stenosis. Recommend clinical correlation. The aortic valve is calcified. Aortic valve regurgitation is trivial. Moderate to severe aortic stenosis is present. Aortic valve mean gradient measures 16.0 mmHg. Aortic valve peak gradient measures 27.2 mmHg. Aortic valve area, by VTI measures 0.81  cm.  Pulmonic Valve: The pulmonic valve was grossly normal. Pulmonic valve regurgitation is not visualized. No evidence of pulmonic stenosis.  Aorta: The aortic root and ascending aorta are structurally normal, with no evidence of dilitation.  Venous: The inferior vena cava is dilated in size with greater than 50% respiratory variability, suggesting right atrial pressure of 8 mmHg.  IAS/Shunts: The atrial septum is grossly normal.   LEFT VENTRICLE PLAX 2D LVIDd:         3.60 cm      Diastology LVIDs:         3.30 cm      LV e' lateral: 8.70 cm/s LV PW:         1.50 cm LV IVS:        1.40 cm LVOT diam:     2.20 cm LV SV:         36 LV SV Index:   17 LVOT Area:     3.80 cm  LV Volumes (MOD) LV vol d, MOD A2C: 177.0 ml LV vol d, MOD A4C: 170.0 ml LV vol s, MOD A2C: 138.0 ml LV vol s, MOD A4C: 108.0 ml LV SV MOD A2C:     39.0 ml LV SV MOD A4C:     170.0 ml LV SV MOD BP:      55.4 ml  RIGHT VENTRICLE RV S prime:     10.30 cm/s TAPSE (M-mode): 1.4 cm  LEFT ATRIUM           Index LA Vol (A2C): 88.8 ml 41.37 ml/m LA Vol (A4C): 55.5 ml 25.86 ml/m AORTIC VALVE AV Area (Vmax):    0.82 cm AV Area (Vmean):   0.65 cm AV Area (VTI):     0.81 cm AV Vmax:           261.00 cm/s AV Vmean:          187.000 cm/s AV VTI:            0.450 m AV Peak Grad:      27.2 mmHg AV Mean Grad:      16.0 mmHg LVOT Vmax:         56.00 cm/s LVOT Vmean:        32.200 cm/s LVOT VTI:          0.096 m LVOT/AV VTI ratio: 0.21  AORTA Ao Asc diam: 3.40 cm  MITRAL VALVE MV Area VTI:  1.81 cm   SHUNTS MV Peak grad: 3.9 mmHg   Systemic VTI:  0.10 m MV Mean grad: 2.0 mmHg   Systemic Diam: 2.20 cm MV Vmax:      0.99 m/s MV Vmean:  67.5 cm/s  Darryle Decent MD Electronically signed by Darryle Decent MD Signature Date/Time: 02/16/2024/11:50:50 AM    Final (Updated)      CT SCANS  CT CORONARY MORPH W/CTA COR W/SCORE 02/25/2024  Addendum 02/25/2024  5:02 PM ADDENDUM REPORT: 02/25/2024  17:00  CLINICAL DATA:  Aortic stenosis  EXAM: Cardiac TAVR CT  TECHNIQUE: The patient was scanned on a Siemens Force 192 slice scanner. A 120 kV retrospective scan was triggered in the descending thoracic aorta at 111 HU's. Gantry rotation speed was 270 msecs and collimation was .9 mm. No beta blockade or nitro were given. The 3D data set was reconstructed in 5% intervals of the R-R cycle. Systolic and diastolic phases were analyzed on a dedicated work station using MPR, MIP and VRT modes. The patient received 80 cc of contrast.  FINDINGS: Aortic Valve: Calcified tri leaflet AV with score 2590  Aorta: Mild dilatation of the ascending thoracic aorta. Severe calcific atherosclerosis Normal arch vessels  Sinotubular Junction: 34.4 mm  Ascending Thoracic Aorta: 38.4 mm  Aortic Arch: 25 mm  Descending Thoracic Aorta: 30.2 mm  Sinus of Valsalva Measurements:  Non-coronary: 36.9 mm  Height 25.9 mm  Right - coronary: 37.8 mm  Height 23.2 mm  Left - coronary: 37.6 mm  Height 17.3 mm  Coronary Artery Height above Annulus:  Left Main: 13.2 mm above annulus  Right Coronary: 18.5 mm above annulus  Virtual Basal Annulus Measurements:  Maximum/Minimum Diameter: 29.8 mm x 23.2 mm Average diameter 26.5 mm  Perimeter: 85.5 mm  Area: 553 mm2  Coronary Arteries: Sufficient height above annulus for deployment LM somewhat shallow  Optimum Fluoroscopic Angle for Delivery: LAO 14 Caudal 9 degrees  IMPRESSION: 1. Calcified tri leaflet AV with score 2590  2. Annular area of 553 mm2 suitable for a 29 mm Sapien 3 valve Alternatively a 34 mm Medtronic Evolut can be considered  3. Optimum angiographic angle for deployment LAO 14 Caudal 9 degrees  4. Coronary arteries suitable height above annulus for deployment although LM somewhat shallow at 13.2 mm  5.  Membranous septal length 6.3 mm  Maude Emmer   Electronically Signed By: Maude Emmer M.D. On: 02/25/2024  17:00  Narrative CLINICAL DATA:  Preprocedural evaluation, TAVR, severe aortic stenosis  EXAM: CT ANGIOGRAPHY CHEST, ABDOMEN AND PELVIS  TECHNIQUE: Multidetector CT imaging through the chest, abdomen and pelvis was performed using the standard protocol during bolus administration of intravenous contrast. Multiplanar reconstructed images and MIPs were obtained and reviewed to evaluate the vascular anatomy.  RADIATION DOSE REDUCTION: This exam was performed according to the departmental dose-optimization program which includes automated exposure control, adjustment of the mA and/or kV according to patient size and/or use of iterative reconstruction technique.  CONTRAST:  95mL OMNIPAQUE  IOHEXOL  350 MG/ML SOLN  COMPARISON:  None Available.  FINDINGS: CTA CHEST FINDINGS  VASCULAR  Aorta: Satisfactory opacification of the aorta. Normal contour and caliber of the thoracic aorta. No evidence of aneurysm, dissection, or other acute aortic pathology. Moderate mixed aortic atherosclerosis. Aortic valve calcifications.  Cardiovascular: No evidence of pulmonary embolism on limited non-tailored examination. Cardiomegaly. Three-vessel coronary artery calcifications. Enlargement of the main pulmonary artery measuring up to 3.7 cm in caliber. No pericardial effusion. Right upper extremity PICC, tip directed into the azygous vein (series 3, image 116).  Review of the MIP images confirms the above findings.  NON VASCULAR  Mediastinum/Nodes: Numerous prominent mediastinal and hilar lymph nodes measuring up to 2.1 x 1.2 cm (series 3, image 120).  Large hiatal hernia with intrathoracic position of the gastric body and fundus. Thyroid gland, trachea, and esophagus demonstrate no significant findings.  Lungs/Pleura: Multifocal bilateral heterogeneous and consolidative airspace disease, particularly conspicuous in the superior segment right lower lobe (series 5, image 55) although present  in all lobes. Mild diffuse bilateral bronchial wall thickening. Compressive atelectasis of the left lower lobe secondary to hernia sac. No pleural effusion or pneumothorax.  Musculoskeletal: No chest wall abnormality. No acute osseous findings.  Review of the MIP images confirms the above findings.  CTA ABDOMEN AND PELVIS FINDINGS  VASCULAR  Normal contour and caliber of the abdominal aorta. No evidence of aneurysm, dissection, or other acute aortic pathology. Standard branching pattern of the abdominal aorta with solitary bilateral renal arteries. Moderate mixed calcific atherosclerosis.  Review of the MIP images confirms the above findings.  NON-VASCULAR  Hepatobiliary: No solid liver abnormality is seen. Coarse contour of the liver. Gallstones. No gallbladder wall thickening, or biliary dilatation.  Pancreas: Unremarkable. No pancreatic ductal dilatation or surrounding inflammatory changes.  Spleen: Normal in size without significant abnormality.  Adrenals/Urinary Tract: Adrenal glands are unremarkable. Lobulated renal cortical scarring, particularly in the superior pole of the right kidney. Punctuate nonobstructive calculus in the superior pole of the right kidney (series 6, image 66). Bladder is unremarkable.  Stomach/Bowel: Stomach is within normal limits. Appendix appears normal. No evidence of bowel wall thickening, distention, or inflammatory changes. Pancolonic diverticulosis.  Lymphatic: No enlarged abdominal or pelvic lymph nodes.  Reproductive: No mass or other significant abnormality.  Other: Small, fat containing bilateral inguinal hernias. No ascites.  Musculoskeletal: No acute osseous findings.  IMPRESSION: 1. Normal contour and caliber of the thoracic and abdominal aorta. No evidence of aneurysm, dissection, or other acute aortic pathology. Moderate mixed calcific atherosclerosis throughout. 2. Aortic valve calcifications in keeping with aortic  stenosis. 3. Cardiomegaly and three-vessel coronary artery disease. 4. Multifocal bilateral heterogeneous and consolidative airspace disease, particularly conspicuous in the superior segment right lower lobe although present in all lobes. Findings are consistent with nonspecific multifocal infection or inflammation. 5. Numerous prominent mediastinal and hilar lymph nodes, likely reactive. 6. Coarse contour of the liver, suggestive of cirrhosis. 7. Right upper extremity PICC, tip directed into the azygous vein. Consider repositioning. 8. Multiple other chronic and incidental findings as detailed above.  Aortic Atherosclerosis (ICD10-I70.0).  Electronically Signed: By: Marolyn JONETTA Jaksch M.D. On: 02/25/2024 13:23   CARDIAC MRI  MR CARDIAC MORPHOLOGY W WO CONTRAST 04/27/2024  Narrative CLINICAL DATA:  70M p/w STEMI 01/2024, underwent PCI to LAD. Echo with EF 20%, LV thrombus, severe AS. Had planned inpatient TAVR but canceled due to LV thrombus. CMR 02/25/24 showed LVEF 32%, RVEF 44%, mild AI, moderate MR, small LV thrombus. Repeat CMR today to evaluate for resolution of LV thrombus so can proceed with TAVR  EXAM: CARDIAC MRI  TECHNIQUE: The patient was scanned on a 1.5 Tesla Siemens magnet. A dedicated cardiac coil was used. Functional imaging was done using Fiesta sequences. 2,3, and 4 chamber views were done to assess for RWMA's. Modified Simpson's rule using a short axis stack was used to calculate an ejection fraction on a dedicated work [employer]. The patient received 10 cc of Gadavist . After 10 minutes inversion recovery sequences were used to assess for infiltration and scar tissue. Phase contrast velocity mapping was performed above the aortic and pulmonic valves  CONTRAST:  10 cc  of Gadavist   FINDINGS: Left ventricle:  -Mild dilatation  -Moderate systolic dysfunction  -  Elevated ECV (35%)  -Subendocardial LGE in mid to apical  anterior/anteroseptal walls, apical inferior wall, and apex. LGE is greater than 50% transmural suggesting region is nonviable  -No LV thrombus  LV EF: 37% (Normal 49-79%)  Absolute volumes:  LV EDV: (Normal 95-215 mL)  LV ESV: (Normal 25-85 mL)  LV SV: 86mL (Normal 61-145 mL)  CO: 4.2L/min (Normal 3.4-7.8 L/min)  Indexed volumes:  LV EDV: 169mL/sq-m (Normal 50-108 mL/sq-m)  LV ESV: 46mL/sq-m (Normal 11-47 mL/sq-m)  LV SV: 62mL/sq-m (Normal 33-72 mL/sq-m)  CI: 2.0L/min/sq-m (Normal 1.8-4.2 L/min/sq-m)  Right ventricle: Normal size with mild systolic dysfunction  RV EF:  54% (Normal 51-80%)  Absolute volumes:  RV EDV: (Normal 109-217 mL)  RV ESV: 70mL (Normal 23-91 mL)  RV SV: 56mL (Normal 71-141 mL)  CO: 1.3L/min (Normal 2.8-8.8 L/min)  Indexed volumes:  RV EDV: 5mL/sq-m (Normal 58-109 mL/sq-m)  RV ESV: 35mL/sq-m (Normal 12-46 mL/sq-m)  RV SV: 55mL/sq-m (Normal 38-71 mL/sq-m)  CI: 1.3L/min/sq-m (Normal 1.7-4.2 L/min/sq-m)  Left atrium: Severe enlargement  Right atrium: Mild enlargement  Mitral valve: Moderate regurgitation (regurgitant fraction 27%)  Aortic valve: Trivial regurgitation.  Visually severe stenosis  Tricuspid valve: Trivial regurgitation  Pulmonic valve: Trivial regurgitation  Aorta: Dilated ascending aorta measuring 40mm  Pulmonary artery: Dilated main pulmonary artery measuring 34mm  Pericardium: Normal  Extracardiac structures: Bilateral airspace disease. Large hiatal hernia  IMPRESSION: 1.  No LV thrombus seen  2. Subendocardial LGE in mid to apical anterior/anteroseptal walls, apical inferior wall, and apex. This is consistent with prior LAD territory infarct. LGE is greater than 50% transmural suggesting region is nonviable  3.  Mild LV dilatation with moderate systolic dysfunction (EF 37%)  4.  Normal RV size with mild systolic dysfunction (EF 45%)  5.  Moderate mitral regurgitation  (regurgitant fraction 27%)  6.  Dilated main pulmonary artery measuring 34mm  7.  Dilated ascending aorta measuring 40mm   Electronically Signed By: Lonni Nanas M.D. On: 04/27/2024 13:15   ______________________________________________________________________________________________      EKG:        Recent Labs: 02/15/2024: ALT 21; TSH 6.656 02/21/2024: Magnesium  2.3 02/29/2024: Hemoglobin 9.6; Platelets 345 03/29/2024: B Natriuretic Peptide 415.3; BUN 23; Creatinine, Ser 1.84; Potassium 4.6; Sodium 137  Recent Lipid Panel    Component Value Date/Time   CHOL 86 02/16/2024 0604   TRIG 67 02/16/2024 0604   HDL 18 (L) 02/16/2024 0604   CHOLHDL 4.8 02/16/2024 0604   VLDL 13 02/16/2024 0604   LDLCALC 55 02/16/2024 0604     Risk Assessment/Calculations:                Physical Exam:    VS:  BP 102/60   Pulse 60   Ht 5' 9 (1.753 m)   Wt 210 lb (95.3 kg)   SpO2 97%   BMI 31.01 kg/m     Wt Readings from Last 3 Encounters:  05/09/24 210 lb (95.3 kg)  03/10/24 208 lb (94.3 kg)  02/29/24 209 lb 7 oz (95 kg)     GEN:  Well nourished, well developed in no acute distress HEENT: Normal NECK: No JVD; No carotid bruits LYMPHATICS: No lymphadenopathy CARDIAC: RRR, 3/6 harsh crescendo decrescendo murmur at the RUSB RESPIRATORY:  Clear to auscultation without rales, wheezing or rhonchi  ABDOMEN: Soft, non-tender, non-distended MUSCULOSKELETAL:  No edema; No deformity  SKIN: Warm and dry NEUROLOGIC:  Alert and oriented x 3 PSYCHIATRIC:  Normal affect   Assessment & Plan Severe  aortic stenosis The patient has severe, stage D2 aortic stenosis (low-flow low gradient with reduced LVEF less than 50%).  We discussed the natural history of aortic stenosis today and its association with congestive heart failure.  The patient's TAVR workup has been deferred due to the presence of LV apical thrombus, but this has now resolved and he appears to have reasonable anatomy for  TAVR via a transfemoral approach from either the right or left femoral artery.  His aortic valve annulus sizes to a 29 mm Edwards SAPIEN 3 valve with large sinuses.  We discussed the procedure today, typical recovery, potential risks, and our approach with heart team review and multidisciplinary evaluation including formal cardiac surgical consultation.  I think the patient will require dental evaluation prior to moving forward with TAVR.  He has significant problems with his dentition and my suspect he would be at increased risk of bacterial endocarditis without treatment.  Will make a formal referral for dental evaluation and also will refer him for formal cardiac surgical evaluation.  As part of his evaluation today, I reviewed his cardiac catheterization study, gated CTA study of the heart, and CTA of the chest, abdomen, and pelvis.  In addition, I reviewed his cardiac MRI which demonstrates resolution of LV apical thrombus. 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  Continue rosuvastatin . Chronic  systolic heart failure (HCC) Followed by the advanced heart failure team.  Cardiac MRI results reviewed with LVEF of 37%.  Patient on appropriate GDMT with spironolactone , furosemide  as needed, and digoxin . Left ventricular apical thrombus Continue apixaban  until he undergoes TAVR.            Medication Adjustments/Labs and Tests Ordered: Current medicines are reviewed at length with the patient today.  Concerns regarding medicines are outlined above.  No orders of the defined types were placed in this encounter.  No orders of the defined types were placed in this encounter.   Patient Instructions  Medication Instructions:  No medication changes were made at this visit. Continue current regimen.   *If you need a refill on your cardiac medications before your next appointment, please  call your pharmacy*  Lab Work: None ordered today. If you have labs (blood work) drawn today and your tests are completely normal, you will receive your results only by: MyChart Message (if you have MyChart) OR A paper copy in the mail If you have any lab test that is abnormal or we need to change your treatment, we will call you to review the results.  Testing/Procedures: None ordered today.  Follow-Up: At [facility], you and your health needs are our priority.  As part of our continuing mission to provide you with exceptional heart care, our providers are all part of one team.  This team includes your primary Cardiologist (physician) and Advanced Practice Providers or APPs (Physician Assistants and Nurse Practitioners) who all work together to provide you with the care you need, when you need it.  We recommend signing up for the patient portal called MyChart.  Sign up information is provided on this After Visit Summary.  MyChart is used to connect with patients for Virtual Visits (Telemedicine).  Patients are able to view lab/test results, encounter notes, upcoming appointments, etc.  Non-urgent messages can be sent to your provider as well.   To learn more about what you can do with MyChart, go to [email or web address].  Other Instructions Lauren, RN will be in contact regarding a dental evaluation and surgery consult.    Signed, Ozell Fell, MD  05/09/2024 12:54 PM    [facility]

## 2024-05-09 NOTE — Telephone Encounter (Signed)
 Pre Surgical Assessment: 5 M Walk Test  43M=16.87ft  5 Meter Walk Test- trial 1: 18.61 seconds 5 Meter Walk Test- trial 2: 13.79 seconds 5 Meter Walk Test- trial 3: 11.38 seconds 5 Meter Walk Test Average: 36.2 seconds

## 2024-05-09 NOTE — Patient Instructions (Signed)
 Medication Instructions:  No medication changes were made at this visit. Continue current regimen.   *If you need a refill on your cardiac medications before your next appointment, please call your pharmacy*  Lab Work: None ordered today. If you have labs (blood work) drawn today and your tests are completely normal, you will receive your results only by: MyChart Message (if you have MyChart) OR A paper copy in the mail If you have any lab test that is abnormal or we need to change your treatment, we will call you to review the results.  Testing/Procedures: None ordered today.  Follow-Up: At [facility], you and your health needs are our priority.  As part of our continuing mission to provide you with exceptional heart care, our providers are all part of one team.  This team includes your primary Cardiologist (physician) and Advanced Practice Providers or APPs (Physician Assistants and Nurse Practitioners) who all work together to provide you with the care you need, when you need it.  We recommend signing up for the patient portal called MyChart.  Sign up information is provided on this After Visit Summary.  MyChart is used to connect with patients for Virtual Visits (Telemedicine).  Patients are able to view lab/test results, encounter notes, upcoming appointments, etc.  Non-urgent messages can be sent to your provider as well.   To learn more about what you can do with MyChart, go to [email or web address].   Other Instructions Lauren, RN will be in contact regarding a dental evaluation and surgery consult.

## 2024-05-09 NOTE — Progress Notes (Signed)
 Pre Surgical Assessment: 5 M Walk Test   15M=16.9ft   5 Meter Walk Test- trial 1: 18.61 seconds 5 Meter Walk Test- trial 2: 13.79 seconds 5 Meter Walk Test- trial 3: 11.38 seconds 5 Meter Walk Test Average: 36.2 seconds        STS score Procedure Type: Isolated AVR Perioperative OutcomeEstimate % Operative Mortality20.6% Morbidity & Mortality36.5% Stroke2.4% Renal Failure6.9% Reoperation5.81% Prolonged Ventilation29.8% Deep Sternal Wound Infection0.196% [facility] Stay [facility] Stay (<6 days)*15.3%

## 2024-05-16 ENCOUNTER — Telehealth: Payer: Self-pay

## 2024-05-16 NOTE — Telephone Encounter (Signed)
 Spoke with pt and he states that he has an appointment with Dr. Celena on 06/02/24 at 2:00PM to access his periapical abscesses.

## 2024-06-08 ENCOUNTER — Other Ambulatory Visit (HOSPITAL_COMMUNITY): Payer: Self-pay | Admitting: Physician Assistant

## 2024-06-17 ENCOUNTER — Encounter: Payer: Self-pay | Admitting: Thoracic Surgery (Cardiothoracic Vascular Surgery)

## 2024-06-17 ENCOUNTER — Ambulatory Visit
Attending: Thoracic Surgery (Cardiothoracic Vascular Surgery) | Admitting: Thoracic Surgery (Cardiothoracic Vascular Surgery)

## 2024-06-17 VITALS — BP 117/69 | HR 69 | Resp 20 | Ht 69.0 in | Wt 207.0 lb

## 2024-06-17 DIAGNOSIS — I060 Rheumatic aortic stenosis: Secondary | ICD-10-CM | POA: Diagnosis not present

## 2024-06-17 NOTE — H&P (View-Only) (Signed)
 [address]       [place] [address]             [phone]        Shravan Salahuddin [facility] Medical Record #[id number] Date of Birth: [birth year]  Referring: Wonda Sharper, MD Primary Care: Haze Kingfisher, MD Primary Cardiologist:Dalton Rolan, MD  Chief Complaint:    Chief Complaint  Patient presents with   Aortic Stenosis    TAVR evaluation, review all studies    History of Present Illness:     Mark Meadows is a 73 y.o. male presents for surgical evaluation of severe aortic stenosis.  He has a history of coronary artery disease with a STEMI in 2020 that was complicated by cardiogenic shock with left ventricular thrombus.  He also has a history of alcoholism, and type 2 diabetes.  His repeat echocardiogram showed resolution of his LV thrombus.  He has continued to follow-up with the heart failure team.  Regards to his symptoms he complains of mild fatigue and some chest pressure.  He also states that he has some dizziness with exertion.      Past Medical History:  Diagnosis Date   Acute blood loss anemia    Alcohol dependence (HCC)    Alcoholic hepatitis    Dysrhythmia    a. 08/2016 -?afib, not clear. b. 09/2017 - official dx atrial flutter.   E. coli UTI 09/22/2016   GERD (gastroesophageal reflux disease)    Gout    Hematochezia    Hyperglycemia    Hypertension    Hypokalemia    Hypothyroidism    Thrombocytopenia     Past Surgical History:  Procedure Laterality Date   COLONOSCOPY N/A 08/26/2016   Procedure: COLONOSCOPY;  Surgeon: Gwendlyn ONEIDA Buddy, MD;  Location: [facility];  Service: Endoscopy;  Laterality: N/A;   CORONARY/GRAFT ACUTE MI REVASCULARIZATION N/A 02/15/2024   Procedure: Coronary/Graft Acute MI Revascularization;  Surgeon: Wonda Sharper, MD;  Location: [facility] INVASIVE CV LAB;  Service: Cardiovascular;  Laterality: N/A;   LEFT HEART CATH AND CORONARY ANGIOGRAPHY N/A 02/15/2024   Procedure: LEFT HEART CATH AND CORONARY  ANGIOGRAPHY;  Surgeon: Wonda Sharper, MD;  Location: [facility] INVASIVE CV LAB;  Service: Cardiovascular;  Laterality: N/A;   RIGHT HEART CATH N/A 02/22/2024   Procedure: RIGHT HEART CATH;  Surgeon: Rolan Ezra RAMAN, MD;  Location: [facility] INVASIVE CV LAB;  Service: Cardiovascular;  Laterality: N/A;   TONSILLECTOMY  1957    Social History:  Social History   Tobacco Use  Smoking Status Former   Current packs/day: 0.00   Average packs/day: 3.0 packs/day for 12.0 years (36.0 ttl pk-yrs)   Types: Cigarettes   Start date: 72   Quit date: 39   Years since quitting: 45.7  Smokeless Tobacco Never    Social History   Substance and Sexual Activity  Alcohol Use Yes   Alcohol/week: 80.0 standard drinks of alcohol   Types: 80 Shots of liquor per week   Comment: 09/02/2016 was at 3 large bottles of cheap bourbon/week; cut down to 2 bottles     No Known Allergies    Current Outpatient Medications  Medication Sig Dispense Refill   allopurinol (ZYLOPRIM) 100 MG tablet Take 100 mg by mouth daily.     amiodarone  (PACERONE ) 200 MG tablet Take 1 tablet (200 mg total) by mouth daily. 30 tablet 5   apixaban  (ELIQUIS ) 5 MG TABS tablet Take 1 tablet (5 mg total) by mouth 2 (two) times daily.  60 tablet 5   clopidogrel  (PLAVIX ) 75 MG tablet Take 1 tablet (75 mg total) by mouth daily with breakfast. 30 tablet 5   colchicine  0.6 MG tablet Take 1 tablet (0.6 mg total) by mouth daily. TAKE 1.2MG  (2) TABLETS TODAY, THEN 0.6MG  (1) TABLET FOR 3 DAYS 5 tablet 0   dapagliflozin  propanediol (FARXIGA ) 10 MG TABS tablet Take 1 tablet (10 mg total) by mouth daily. 90 tablet 3   digoxin  (LANOXIN ) 0.125 MG tablet Take 1 tablet (0.125 mg total) by mouth daily. 30 tablet 5   furosemide  (LASIX ) 40 MG tablet Take 1 tablet by mouth as needed for weight gain, lower extremity edema, increased shortness of breath 30 tablet 2   levothyroxine  (SYNTHROID ) 150 MCG tablet Take 150-300 mcg by mouth See admin instructions. Take 150mcg  (1 tablet) by mouth 5 days a week, then take 300mcg (2 tablets) 2 days a week.     pantoprazole  (PROTONIX ) 40 MG tablet Take 1 tablet (40 mg total) by mouth daily. 30 tablet 5   rosuvastatin  (CRESTOR ) 40 MG tablet TAKE 1 TABLET BY MOUTH EVERY DAY 90 tablet 1   spironolactone  (ALDACTONE ) 25 MG tablet Take 1 tablet (25 mg total) by mouth daily. 30 tablet 6   tamsulosin  (FLOMAX ) 0.4 MG CAPS capsule Take 0.4 mg by mouth.     No current facility-administered medications for this visit.    (Not in a hospital admission)   Family History  Problem Relation Age of Onset   Thyroid disease Mother    Heart disease Father      Review of Systems:   Review of Systems  Constitutional:  Positive for malaise/fatigue.  Respiratory:  Positive for shortness of breath.   Cardiovascular:  Positive for chest pain.  Neurological:  Positive for dizziness.      Physical Exam: BP 117/69 (BP Location: Left Arm, Patient Position: Sitting, Cuff Size: Normal)   Pulse 69   Resp 20   Ht 5' 9 (1.753 m)   Wt 207 lb (93.9 kg)   SpO2 98% Comment: RA  BMI 30.57 kg/m  Physical Exam Constitutional:      General: He is not in acute distress.    Appearance: He is not ill-appearing.  HENT:     Head: Normocephalic and atraumatic.  Eyes:     Extraocular Movements: Extraocular movements intact.  Cardiovascular:     Rate and Rhythm: Normal rate.  Pulmonary:     Effort: Pulmonary effort is normal. No respiratory distress.  Abdominal:     General: Abdomen is flat. There is no distension.  Musculoskeletal:        General: Normal range of motion.     Cervical back: Normal range of motion.  Skin:    General: Skin is warm and dry.  Neurological:     General: No focal deficit present.     Mental Status: He is alert and oriented to person, place, and time.       Cardiac Studies & Procedures   ______________________________________________________________________________________________ CARDIAC  CATHETERIZATION  CARDIAC CATHETERIZATION 02/22/2024  Conclusion 1. Elevated right and left heart filling pressures. 2. Preserved CO by Fick though low by thermodilution. 3. Moderate pulmonary venous hypertension. 4. Adequate PAPi  Continue to diuresed, will leave off milrinone  for now.   CARDIAC CATHETERIZATION 02/15/2024  Conclusion   Prox LAD to Mid LAD lesion is 100% stenosed.   A drug-eluting stent was successfully placed using a STENT SYNERGY XD 3.0X16.   Post intervention, there is a  0% residual stenosis.   LV end diastolic pressure is severely elevated.   The left ventricular ejection fraction is 25-35% by visual estimate.   Recommend uninterrupted dual antiplatelet therapy with Aspirin  81mg  daily and Clopidogrel  75mg  daily for a minimum of 12 months (ACS-Class I recommendation).  1.  Acute, late presenting STEMI involving the LAD, treated successfully with PCI using PTCA followed by stenting of the proximal LAD with baseline TIMI 0 flow, restored to TIMI-3 flow at the completion of the procedure 2.  Mild nonobstructive plaquing in the left main, left circumflex, and RCA with a heavily calcified RCA 3.  Severe segmental LV dysfunction with akinesis of the entire anterolateral wall, apex, and inferoapex, LVEF estimated at approximately 25 to 30% 4.  Angiographic findings on ventriculography suspicious for LV apical thrombus 5.  Severely elevated LVEDP  Recommendations: DAPT with aspirin  and clopidogrel  minimum of 12 months, check 2D echo with contrast to evaluate for LV apical thrombus and accurately assess LVEF, post MI medical therapy, IV diuresis with furosemide  in the setting of high LVEDP (acute systolic heart failure secondary to acute anterior infarct).  Institute GDMT as tolerated.  Findings Coronary Findings Diagnostic  Dominance: Right  Left Main The vessel exhibits minimal luminal irregularities. The vessel is calcified. Left main is calcified with no  stenosis.  Left Anterior Descending Prox LAD to Mid LAD lesion is 100% stenosed.  Left Circumflex The vessel exhibits minimal luminal irregularities. The circumflex is patent throughout.  The vessel has minimal nonobstructive disease.  Right Coronary Artery Vessel is large. There is mild diffuse disease throughout the vessel. Heavily calcified vessel.  The RCA is dominant and supplies a large PDA branch.  The vessel is patent throughout its course with no significant obstructive disease.  The vessel is heavily calcified with mild plaquing.  Intervention  Prox LAD to Mid LAD lesion Stent CATH VISTA GUIDE 6FR [id number] guide catheter was inserted. Lesion crossed with guidewire using a WIRE HI TORQ WHISPER MS 190CM. Pre-stent angioplasty was performed using a BALLOON SAPPHIRE NC24 3.0X15. Maximum pressure:  12 atm. A drug-eluting stent was successfully placed using a STENT SYNERGY XD 3.0X16. Post-stent angioplasty was performed using a BALLOON [place] EMERGE MR [id number]. Maximum pressure:  16 atm. Post-Intervention Lesion Assessment The intervention was successful. Pre-interventional TIMI flow is 0. Post-intervention TIMI flow is 3. No complications occurred at this lesion. There is a 0% residual stenosis post intervention.     ECHOCARDIOGRAM  ECHOCARDIOGRAM COMPLETE 02/16/2024  Narrative ECHOCARDIOGRAM REPORT    Patient Name:   Mark Meadows Date of Exam: 02/16/2024 Medical Rec #:  [id number]    Height:       68.0 in Accession #:    [id number]   Weight:       224.4 lb Date of Birth:  05/03/1951    BSA:          2.146 m Patient Age:    73 years     BP:           115/86 mmHg Patient Gender: M            HR:           107 bpm. Exam Location:  Inpatient  Procedure: 2D Echo, Cardiac Doppler and Color Doppler (Both Spectral and Color Flow Doppler were utilized during procedure).  Indications:     I50.21 Acute systolic (congestive) heart failure  History:         Patient has prior history of  Echocardiogram examinations,  most recent 10/06/2017.  Sonographer:     Eva Lash Referring Phys:  [phone] MICHAEL COOPER Diagnosing Phys: Darryle Decent MD  IMPRESSIONS   1. Akinesis of the mid septum into apex and inferior septum consistent with LAD infarction. LVEF 15-20%. Cannot exclude a layered LV thrombus on contrast imaging. Consider cardiac MRI for definitive evaluation. LVOT VTI 9 cm. CO estimated 1.8 L/min and CI 0.8 L/min/m2, concerning for low output state. Left ventricular ejection fraction, by estimation, is 15-20%. The left ventricle has severely decreased function. The left ventricle demonstrates regional wall motion abnormalities (see scoring diagram/findings for description). There is moderate concentric left ventricular hypertrophy. Indeterminate diastolic filling due to E-A fusion. 2. Severely calcified aortic valve with restricted leaflet movement. Vmax 2.6 m/s, MG 16 mmHG, AVA 0.81 cm2, DI 0.24. This is concerning for low flow low gradient severe aortic stenosis. Recommend clinical correlation. The aortic valve is calcified. Aortic valve regurgitation is trivial. Moderate to severe aortic valve stenosis. 3. Right ventricular systolic function is normal. The right ventricular size is mildly enlarged. Tricuspid regurgitation signal is inadequate for assessing PA pressure. 4. The mitral valve is degenerative. Mild mitral valve regurgitation. No evidence of mitral stenosis. 5. The inferior vena cava is dilated in size with >50% respiratory variability, suggesting right atrial pressure of 8 mmHg.  FINDINGS Left Ventricle: Akinesis of the mid septum into apex and inferior septum consistent with LAD infarction. LVEF 15-20%. Cannot exclude a layered LV thrombus on contrast imaging. Consider cardiac MRI for definitive evaluation. LVOT VTI 9 cm. CO estimated 1.8 L/min and CI 0.8 L/min/m2, concerning for low output state. Left ventricular ejection fraction, by estimation, is 15-20%. The  left ventricle has severely decreased function. The left ventricle demonstrates regional wall motion abnormalities. Definity  contrast agent was given IV to delineate the left ventricular endocardial borders. Strain was performed and the global longitudinal strain is indeterminate. The left ventricular internal cavity size was normal in size. There is moderate concentric left ventricular hypertrophy. Indeterminate diastolic filling due to E-A fusion.   LV Wall Scoring: The mid inferoseptal segment, apical septal segment, apical anterior segment, apical inferior segment, and apex are akinetic.  Right Ventricle: The right ventricular size is mildly enlarged. No increase in right ventricular wall thickness. Right ventricular systolic function is normal. Tricuspid regurgitation signal is inadequate for assessing PA pressure.  Left Atrium: Left atrial size was normal in size.  Right Atrium: Right atrial size was normal in size.  Pericardium: There is no evidence of pericardial effusion.  Mitral Valve: The mitral valve is degenerative in appearance. Mild mitral valve regurgitation. No evidence of mitral valve stenosis. MV peak gradient, 3.9 mmHg. The mean mitral valve gradient is 2.0 mmHg.  Tricuspid Valve: The tricuspid valve is grossly normal. Tricuspid valve regurgitation is mild . No evidence of tricuspid stenosis.  Aortic Valve: Severely calcified aortic valve with restricted leaflet movement. Vmax 2.6 m/s, MG 16 mmHG, AVA 0.81 cm2, DI 0.24. This is concerning for low flow low gradient severe aortic stenosis. Recommend clinical correlation. The aortic valve is calcified. Aortic valve regurgitation is trivial. Moderate to severe aortic stenosis is present. Aortic valve mean gradient measures 16.0 mmHg. Aortic valve peak gradient measures 27.2 mmHg. Aortic valve area, by VTI measures 0.81 cm.  Pulmonic Valve: The pulmonic valve was grossly normal. Pulmonic valve regurgitation is not visualized.  No evidence of pulmonic stenosis.  Aorta: The aortic root and ascending aorta are structurally normal, with no evidence of dilitation.  Venous: The inferior  vena cava is dilated in size with greater than 50% respiratory variability, suggesting right atrial pressure of 8 mmHg.  IAS/Shunts: The atrial septum is grossly normal.   LEFT VENTRICLE PLAX 2D LVIDd:         3.60 cm      Diastology LVIDs:         3.30 cm      LV e' lateral: 8.70 cm/s LV PW:         1.50 cm LV IVS:        1.40 cm LVOT diam:     2.20 cm LV SV:         36 LV SV Index:   17 LVOT Area:     3.80 cm  LV Volumes (MOD) LV vol d, MOD A2C: 177.0 ml LV vol d, MOD A4C: 170.0 ml LV vol s, MOD A2C: 138.0 ml LV vol s, MOD A4C: 108.0 ml LV SV MOD A2C:     39.0 ml LV SV MOD A4C:     170.0 ml LV SV MOD BP:      55.4 ml  RIGHT VENTRICLE RV S prime:     10.30 cm/s TAPSE (M-mode): 1.4 cm  LEFT ATRIUM           Index LA Vol (A2C): 88.8 ml 41.37 ml/m LA Vol (A4C): 55.5 ml 25.86 ml/m AORTIC VALVE AV Area (Vmax):    0.82 cm AV Area (Vmean):   0.65 cm AV Area (VTI):     0.81 cm AV Vmax:           261.00 cm/s AV Vmean:          187.000 cm/s AV VTI:            0.450 m AV Peak Grad:      27.2 mmHg AV Mean Grad:      16.0 mmHg LVOT Vmax:         56.00 cm/s LVOT Vmean:        32.200 cm/s LVOT VTI:          0.096 m LVOT/AV VTI ratio: 0.21  AORTA Ao Asc diam: 3.40 cm  MITRAL VALVE MV Area VTI:  1.81 cm   SHUNTS MV Peak grad: 3.9 mmHg   Systemic VTI:  0.10 m MV Mean grad: 2.0 mmHg   Systemic Diam: 2.20 cm MV Vmax:      0.99 m/s MV Vmean:     67.5 cm/s  Darryle Decent MD Electronically signed by Darryle Decent MD Signature Date/Time: 02/16/2024/11:50:50 AM    Final (Updated)      CT SCANS  CT CORONARY MORPH W/CTA COR W/SCORE 02/25/2024  Addendum 02/25/2024  5:02 PM ADDENDUM REPORT: 02/25/2024 17:00  CLINICAL DATA:  Aortic stenosis  EXAM: Cardiac TAVR CT  TECHNIQUE: The patient was scanned on a  Siemens Force 192 slice scanner. A 120 kV retrospective scan was triggered in the descending thoracic aorta at 111 HU's. Gantry rotation speed was 270 msecs and collimation was .9 mm. No beta blockade or nitro were given. The 3D data set was reconstructed in 5% intervals of the R-R cycle. Systolic and diastolic phases were analyzed on a dedicated work station using MPR, MIP and VRT modes. The patient received 80 cc of contrast.  FINDINGS: Aortic Valve: Calcified tri leaflet AV with score 2590  Aorta: Mild dilatation of the ascending thoracic aorta. Severe calcific atherosclerosis Normal arch vessels  Sinotubular Junction: 34.4 mm  Ascending Thoracic Aorta: 38.4 mm  Aortic Arch:  25 mm  Descending Thoracic Aorta: 30.2 mm  Sinus of Valsalva Measurements:  Non-coronary: 36.9 mm  Height 25.9 mm  Right - coronary: 37.8 mm  Height 23.2 mm  Left - coronary: 37.6 mm  Height 17.3 mm  Coronary Artery Height above Annulus:  Left Main: 13.2 mm above annulus  Right Coronary: 18.5 mm above annulus  Virtual Basal Annulus Measurements:  Maximum/Minimum Diameter: 29.8 mm x 23.2 mm Average diameter 26.5 mm  Perimeter: 85.5 mm  Area: 553 mm2  Coronary Arteries: Sufficient height above annulus for deployment LM somewhat shallow  Optimum Fluoroscopic Angle for Delivery: LAO 14 Caudal 9 degrees  IMPRESSION: 1. Calcified tri leaflet AV with score 2590  2. Annular area of 553 mm2 suitable for a 29 mm Sapien 3 valve Alternatively a 34 mm Medtronic Evolut can be considered  3. Optimum angiographic angle for deployment LAO 14 Caudal 9 degrees  4. Coronary arteries suitable height above annulus for deployment although LM somewhat shallow at 13.2 mm  5.  Membranous septal length 6.3 mm  Maude Emmer   Electronically Signed By: Maude Emmer M.D. On: 02/25/2024 17:00  Narrative CLINICAL DATA:  Preprocedural evaluation, TAVR, severe aortic stenosis  EXAM: CT ANGIOGRAPHY  CHEST, ABDOMEN AND PELVIS  TECHNIQUE: Multidetector CT imaging through the chest, abdomen and pelvis was performed using the standard protocol during bolus administration of intravenous contrast. Multiplanar reconstructed images and MIPs were obtained and reviewed to evaluate the vascular anatomy.  RADIATION DOSE REDUCTION: This exam was performed according to the departmental dose-optimization program which includes automated exposure control, adjustment of the mA and/or kV according to patient size and/or use of iterative reconstruction technique.  CONTRAST:  95mL OMNIPAQUE  IOHEXOL  350 MG/ML SOLN  COMPARISON:  None Available.  FINDINGS: CTA CHEST FINDINGS  VASCULAR  Aorta: Satisfactory opacification of the aorta. Normal contour and caliber of the thoracic aorta. No evidence of aneurysm, dissection, or other acute aortic pathology. Moderate mixed aortic atherosclerosis. Aortic valve calcifications.  Cardiovascular: No evidence of pulmonary embolism on limited non-tailored examination. Cardiomegaly. Three-vessel coronary artery calcifications. Enlargement of the main pulmonary artery measuring up to 3.7 cm in caliber. No pericardial effusion. Right upper extremity PICC, tip directed into the azygous vein (series 3, image 116).  Review of the MIP images confirms the above findings.  NON VASCULAR  Mediastinum/Nodes: Numerous prominent mediastinal and hilar lymph nodes measuring up to 2.1 x 1.2 cm (series 3, image 120). Large hiatal hernia with intrathoracic position of the gastric body and fundus. Thyroid gland, trachea, and esophagus demonstrate no significant findings.  Lungs/Pleura: Multifocal bilateral heterogeneous and consolidative airspace disease, particularly conspicuous in the superior segment right lower lobe (series 5, image 55) although present in all lobes. Mild diffuse bilateral bronchial wall thickening. Compressive atelectasis of the left lower lobe  secondary to hernia sac. No pleural effusion or pneumothorax.  Musculoskeletal: No chest wall abnormality. No acute osseous findings.  Review of the MIP images confirms the above findings.  CTA ABDOMEN AND PELVIS FINDINGS  VASCULAR  Normal contour and caliber of the abdominal aorta. No evidence of aneurysm, dissection, or other acute aortic pathology. Standard branching pattern of the abdominal aorta with solitary bilateral renal arteries. Moderate mixed calcific atherosclerosis.  Review of the MIP images confirms the above findings.  NON-VASCULAR  Hepatobiliary: No solid liver abnormality is seen. Coarse contour of the liver. Gallstones. No gallbladder wall thickening, or biliary dilatation.  Pancreas: Unremarkable. No pancreatic ductal dilatation or surrounding inflammatory changes.  Spleen: Normal  in size without significant abnormality.  Adrenals/Urinary Tract: Adrenal glands are unremarkable. Lobulated renal cortical scarring, particularly in the superior pole of the right kidney. Punctuate nonobstructive calculus in the superior pole of the right kidney (series 6, image 66). Bladder is unremarkable.  Stomach/Bowel: Stomach is within normal limits. Appendix appears normal. No evidence of bowel wall thickening, distention, or inflammatory changes. Pancolonic diverticulosis.  Lymphatic: No enlarged abdominal or pelvic lymph nodes.  Reproductive: No mass or other significant abnormality.  Other: Small, fat containing bilateral inguinal hernias. No ascites.  Musculoskeletal: No acute osseous findings.  IMPRESSION: 1. Normal contour and caliber of the thoracic and abdominal aorta. No evidence of aneurysm, dissection, or other acute aortic pathology. Moderate mixed calcific atherosclerosis throughout. 2. Aortic valve calcifications in keeping with aortic stenosis. 3. Cardiomegaly and three-vessel coronary artery disease. 4. Multifocal bilateral heterogeneous and  consolidative airspace disease, particularly conspicuous in the superior segment right lower lobe although present in all lobes. Findings are consistent with nonspecific multifocal infection or inflammation. 5. Numerous prominent mediastinal and hilar lymph nodes, likely reactive. 6. Coarse contour of the liver, suggestive of cirrhosis. 7. Right upper extremity PICC, tip directed into the azygous vein. Consider repositioning. 8. Multiple other chronic and incidental findings as detailed above.  Aortic Atherosclerosis (ICD10-I70.0).  Electronically Signed: By: Marolyn JONETTA Jaksch M.D. On: 02/25/2024 13:23   CARDIAC MRI  MR CARDIAC MORPHOLOGY W WO CONTRAST 04/27/2024  Narrative CLINICAL DATA:  55M p/w STEMI 01/2024, underwent PCI to LAD. Echo with EF 20%, LV thrombus, severe AS. Had planned inpatient TAVR but canceled due to LV thrombus. CMR 02/25/24 showed LVEF 32%, RVEF 44%, mild AI, moderate MR, small LV thrombus. Repeat CMR today to evaluate for resolution of LV thrombus so can proceed with TAVR  EXAM: CARDIAC MRI  TECHNIQUE: The patient was scanned on a 1.5 Tesla Siemens magnet. A dedicated cardiac coil was used. Functional imaging was done using Fiesta sequences. 2,3, and 4 chamber views were done to assess for RWMA's. Modified Simpson's rule using a short axis stack was used to calculate an ejection fraction on a dedicated work [employer]. The patient received 10 cc of Gadavist . After 10 minutes inversion recovery sequences were used to assess for infiltration and scar tissue. Phase contrast velocity mapping was performed above the aortic and pulmonic valves  CONTRAST:  10 cc  of Gadavist   FINDINGS: Left ventricle:  -Mild dilatation  -Moderate systolic dysfunction  -Elevated ECV (35%)  -Subendocardial LGE in mid to apical anterior/anteroseptal walls, apical inferior wall, and apex. LGE is greater than 50% transmural suggesting region is  nonviable  -No LV thrombus  LV EF: 37% (Normal 49-79%)  Absolute volumes:  LV EDV: (Normal 95-215 mL)  LV ESV: (Normal 25-85 mL)  LV SV: 86mL (Normal 61-145 mL)  CO: 4.2L/min (Normal 3.4-7.8 L/min)  Indexed volumes:  LV EDV: 19mL/sq-m (Normal 50-108 mL/sq-m)  LV ESV: 43mL/sq-m (Normal 11-47 mL/sq-m)  LV SV: 43mL/sq-m (Normal 33-72 mL/sq-m)  CI: 2.0L/min/sq-m (Normal 1.8-4.2 L/min/sq-m)  Right ventricle: Normal size with mild systolic dysfunction  RV EF:  54% (Normal 51-80%)  Absolute volumes:  RV EDV: (Normal 109-217 mL)  RV ESV: 70mL (Normal 23-91 mL)  RV SV: 56mL (Normal 71-141 mL)  CO: 1.3L/min (Normal 2.8-8.8 L/min)  Indexed volumes:  RV EDV: 59mL/sq-m (Normal 58-109 mL/sq-m)  RV ESV: 75mL/sq-m (Normal 12-46 mL/sq-m)  RV SV: 24mL/sq-m (Normal 38-71 mL/sq-m)  CI: 1.3L/min/sq-m (Normal 1.7-4.2 L/min/sq-m)  Left atrium: Severe enlargement  Right atrium: Mild enlargement  Mitral valve: Moderate regurgitation (regurgitant fraction 27%)  Aortic valve: Trivial regurgitation.  Visually severe stenosis  Tricuspid valve: Trivial regurgitation  Pulmonic valve: Trivial regurgitation  Aorta: Dilated ascending aorta measuring 40mm  Pulmonary artery: Dilated main pulmonary artery measuring 34mm  Pericardium: Normal  Extracardiac structures: Bilateral airspace disease. Large hiatal hernia  IMPRESSION: 1.  No LV thrombus seen  2. Subendocardial LGE in mid to apical anterior/anteroseptal walls, apical inferior wall, and apex. This is consistent with prior LAD territory infarct. LGE is greater than 50% transmural suggesting region is nonviable  3.  Mild LV dilatation with moderate systolic dysfunction (EF 37%)  4.  Normal RV size with mild systolic dysfunction (EF 45%)  5.  Moderate mitral regurgitation (regurgitant fraction 27%)  6.  Dilated main pulmonary artery measuring 34mm  7.  Dilated ascending aorta measuring  40mm   Electronically Signed By: Lonni Nanas M.D. On: 04/27/2024 13:15   ______________________________________________________________________________________________      ECG afib    I have independently reviewed the above radiologic studies and discussed with the patient   Recent Lab Findings: Lab Results  Component Value Date   WBC 16.1 (H) 02/29/2024   HGB 9.6 (L) 02/29/2024   HCT 33.4 (L) 02/29/2024   PLT 345 02/29/2024   GLUCOSE 134 (H) 03/29/2024   CHOL 86 02/16/2024   TRIG 67 02/16/2024   HDL 18 (L) 02/16/2024   LDLCALC 55 02/16/2024   ALT 21 02/15/2024   AST 115 (H) 02/15/2024   NA 137 03/29/2024   K 4.6 03/29/2024   CL 104 03/29/2024   CREATININE 1.84 (H) 03/29/2024   BUN 23 03/29/2024   CO2 21 (L) 03/29/2024   TSH 6.656 (H) 02/15/2024   INR 1.2 02/15/2024   HGBA1C 6.4 (H) 02/15/2024      Assessment / Plan:   73 y.o. male with severe aortic stenosis.  STS score: 20.6.  NYHA Class III.  He also has an ejection fraction 32% cardiac.  The LV thrombus is resolved.  The risks and benefits of transfemoral TAVR were discussed in detail.  We also discussed possibility of an emergent sternotomy to address any procedural complications.  Based on our discussion, we collectively decided that an emergent sternotomy would not be indicated.  The patient is agreeable to proceed.  Based on my review of her LHC, echo, and CTA, I agree with the multidisciplinary plan to proceed with a 29mm SAPIEN 3 TAVR.      I  spent 40 minutes counseling the patient face to face.   Linnie MALVA Rayas 06/17/2024 10:21 AM

## 2024-06-17 NOTE — Progress Notes (Signed)
 [address]       [place] [address]             [phone]        Shravan Salahuddin [facility] Medical Record #[id number] Date of Birth: [birth year]  Referring: Wonda Sharper, MD Primary Care: Haze Kingfisher, MD Primary Cardiologist:Dalton Rolan, MD  Chief Complaint:    Chief Complaint  Patient presents with   Aortic Stenosis    TAVR evaluation, review all studies    History of Present Illness:     Mark Meadows is a 73 y.o. male presents for surgical evaluation of severe aortic stenosis.  He has a history of coronary artery disease with a STEMI in 2020 that was complicated by cardiogenic shock with left ventricular thrombus.  He also has a history of alcoholism, and type 2 diabetes.  His repeat echocardiogram showed resolution of his LV thrombus.  He has continued to follow-up with the heart failure team.  Regards to his symptoms he complains of mild fatigue and some chest pressure.  He also states that he has some dizziness with exertion.      Past Medical History:  Diagnosis Date   Acute blood loss anemia    Alcohol dependence (HCC)    Alcoholic hepatitis    Dysrhythmia    a. 08/2016 -?afib, not clear. b. 09/2017 - official dx atrial flutter.   E. coli UTI 09/22/2016   GERD (gastroesophageal reflux disease)    Gout    Hematochezia    Hyperglycemia    Hypertension    Hypokalemia    Hypothyroidism    Thrombocytopenia     Past Surgical History:  Procedure Laterality Date   COLONOSCOPY N/A 08/26/2016   Procedure: COLONOSCOPY;  Surgeon: Gwendlyn ONEIDA Buddy, MD;  Location: [facility];  Service: Endoscopy;  Laterality: N/A;   CORONARY/GRAFT ACUTE MI REVASCULARIZATION N/A 02/15/2024   Procedure: Coronary/Graft Acute MI Revascularization;  Surgeon: Wonda Sharper, MD;  Location: [facility] INVASIVE CV LAB;  Service: Cardiovascular;  Laterality: N/A;   LEFT HEART CATH AND CORONARY ANGIOGRAPHY N/A 02/15/2024   Procedure: LEFT HEART CATH AND CORONARY  ANGIOGRAPHY;  Surgeon: Wonda Sharper, MD;  Location: [facility] INVASIVE CV LAB;  Service: Cardiovascular;  Laterality: N/A;   RIGHT HEART CATH N/A 02/22/2024   Procedure: RIGHT HEART CATH;  Surgeon: Rolan Ezra RAMAN, MD;  Location: [facility] INVASIVE CV LAB;  Service: Cardiovascular;  Laterality: N/A;   TONSILLECTOMY  1957    Social History:  Social History   Tobacco Use  Smoking Status Former   Current packs/day: 0.00   Average packs/day: 3.0 packs/day for 12.0 years (36.0 ttl pk-yrs)   Types: Cigarettes   Start date: 72   Quit date: 39   Years since quitting: 45.7  Smokeless Tobacco Never    Social History   Substance and Sexual Activity  Alcohol Use Yes   Alcohol/week: 80.0 standard drinks of alcohol   Types: 80 Shots of liquor per week   Comment: 09/02/2016 was at 3 large bottles of cheap bourbon/week; cut down to 2 bottles     No Known Allergies    Current Outpatient Medications  Medication Sig Dispense Refill   allopurinol (ZYLOPRIM) 100 MG tablet Take 100 mg by mouth daily.     amiodarone  (PACERONE ) 200 MG tablet Take 1 tablet (200 mg total) by mouth daily. 30 tablet 5   apixaban  (ELIQUIS ) 5 MG TABS tablet Take 1 tablet (5 mg total) by mouth 2 (two) times daily.  60 tablet 5   clopidogrel  (PLAVIX ) 75 MG tablet Take 1 tablet (75 mg total) by mouth daily with breakfast. 30 tablet 5   colchicine  0.6 MG tablet Take 1 tablet (0.6 mg total) by mouth daily. TAKE 1.2MG  (2) TABLETS TODAY, THEN 0.6MG  (1) TABLET FOR 3 DAYS 5 tablet 0   dapagliflozin  propanediol (FARXIGA ) 10 MG TABS tablet Take 1 tablet (10 mg total) by mouth daily. 90 tablet 3   digoxin  (LANOXIN ) 0.125 MG tablet Take 1 tablet (0.125 mg total) by mouth daily. 30 tablet 5   furosemide  (LASIX ) 40 MG tablet Take 1 tablet by mouth as needed for weight gain, lower extremity edema, increased shortness of breath 30 tablet 2   levothyroxine  (SYNTHROID ) 150 MCG tablet Take 150-300 mcg by mouth See admin instructions. Take 150mcg  (1 tablet) by mouth 5 days a week, then take 300mcg (2 tablets) 2 days a week.     pantoprazole  (PROTONIX ) 40 MG tablet Take 1 tablet (40 mg total) by mouth daily. 30 tablet 5   rosuvastatin  (CRESTOR ) 40 MG tablet TAKE 1 TABLET BY MOUTH EVERY DAY 90 tablet 1   spironolactone  (ALDACTONE ) 25 MG tablet Take 1 tablet (25 mg total) by mouth daily. 30 tablet 6   tamsulosin  (FLOMAX ) 0.4 MG CAPS capsule Take 0.4 mg by mouth.     No current facility-administered medications for this visit.    (Not in a hospital admission)   Family History  Problem Relation Age of Onset   Thyroid disease Mother    Heart disease Father      Review of Systems:   Review of Systems  Constitutional:  Positive for malaise/fatigue.  Respiratory:  Positive for shortness of breath.   Cardiovascular:  Positive for chest pain.  Neurological:  Positive for dizziness.      Physical Exam: BP 117/69 (BP Location: Left Arm, Patient Position: Sitting, Cuff Size: Normal)   Pulse 69   Resp 20   Ht 5' 9 (1.753 m)   Wt 207 lb (93.9 kg)   SpO2 98% Comment: RA  BMI 30.57 kg/m  Physical Exam Constitutional:      General: He is not in acute distress.    Appearance: He is not ill-appearing.  HENT:     Head: Normocephalic and atraumatic.  Eyes:     Extraocular Movements: Extraocular movements intact.  Cardiovascular:     Rate and Rhythm: Normal rate.  Pulmonary:     Effort: Pulmonary effort is normal. No respiratory distress.  Abdominal:     General: Abdomen is flat. There is no distension.  Musculoskeletal:        General: Normal range of motion.     Cervical back: Normal range of motion.  Skin:    General: Skin is warm and dry.  Neurological:     General: No focal deficit present.     Mental Status: He is alert and oriented to person, place, and time.       Cardiac Studies & Procedures   ______________________________________________________________________________________________ CARDIAC  CATHETERIZATION  CARDIAC CATHETERIZATION 02/22/2024  Conclusion 1. Elevated right and left heart filling pressures. 2. Preserved CO by Fick though low by thermodilution. 3. Moderate pulmonary venous hypertension. 4. Adequate PAPi  Continue to diuresed, will leave off milrinone  for now.   CARDIAC CATHETERIZATION 02/15/2024  Conclusion   Prox LAD to Mid LAD lesion is 100% stenosed.   A drug-eluting stent was successfully placed using a STENT SYNERGY XD 3.0X16.   Post intervention, there is a  0% residual stenosis.   LV end diastolic pressure is severely elevated.   The left ventricular ejection fraction is 25-35% by visual estimate.   Recommend uninterrupted dual antiplatelet therapy with Aspirin  81mg  daily and Clopidogrel  75mg  daily for a minimum of 12 months (ACS-Class I recommendation).  1.  Acute, late presenting STEMI involving the LAD, treated successfully with PCI using PTCA followed by stenting of the proximal LAD with baseline TIMI 0 flow, restored to TIMI-3 flow at the completion of the procedure 2.  Mild nonobstructive plaquing in the left main, left circumflex, and RCA with a heavily calcified RCA 3.  Severe segmental LV dysfunction with akinesis of the entire anterolateral wall, apex, and inferoapex, LVEF estimated at approximately 25 to 30% 4.  Angiographic findings on ventriculography suspicious for LV apical thrombus 5.  Severely elevated LVEDP  Recommendations: DAPT with aspirin  and clopidogrel  minimum of 12 months, check 2D echo with contrast to evaluate for LV apical thrombus and accurately assess LVEF, post MI medical therapy, IV diuresis with furosemide  in the setting of high LVEDP (acute systolic heart failure secondary to acute anterior infarct).  Institute GDMT as tolerated.  Findings Coronary Findings Diagnostic  Dominance: Right  Left Main The vessel exhibits minimal luminal irregularities. The vessel is calcified. Left main is calcified with no  stenosis.  Left Anterior Descending Prox LAD to Mid LAD lesion is 100% stenosed.  Left Circumflex The vessel exhibits minimal luminal irregularities. The circumflex is patent throughout.  The vessel has minimal nonobstructive disease.  Right Coronary Artery Vessel is large. There is mild diffuse disease throughout the vessel. Heavily calcified vessel.  The RCA is dominant and supplies a large PDA branch.  The vessel is patent throughout its course with no significant obstructive disease.  The vessel is heavily calcified with mild plaquing.  Intervention  Prox LAD to Mid LAD lesion Stent CATH VISTA GUIDE 6FR [id number] guide catheter was inserted. Lesion crossed with guidewire using a WIRE HI TORQ WHISPER MS 190CM. Pre-stent angioplasty was performed using a BALLOON SAPPHIRE NC24 3.0X15. Maximum pressure:  12 atm. A drug-eluting stent was successfully placed using a STENT SYNERGY XD 3.0X16. Post-stent angioplasty was performed using a BALLOON [place] EMERGE MR [id number]. Maximum pressure:  16 atm. Post-Intervention Lesion Assessment The intervention was successful. Pre-interventional TIMI flow is 0. Post-intervention TIMI flow is 3. No complications occurred at this lesion. There is a 0% residual stenosis post intervention.     ECHOCARDIOGRAM  ECHOCARDIOGRAM COMPLETE 02/16/2024  Narrative ECHOCARDIOGRAM REPORT    Patient Name:   Christorpher Nickolas Date of Exam: 02/16/2024 Medical Rec #:  [id number]    Height:       68.0 in Accession #:    [id number]   Weight:       224.4 lb Date of Birth:  05/03/1951    BSA:          2.146 m Patient Age:    73 years     BP:           115/86 mmHg Patient Gender: M            HR:           107 bpm. Exam Location:  Inpatient  Procedure: 2D Echo, Cardiac Doppler and Color Doppler (Both Spectral and Color Flow Doppler were utilized during procedure).  Indications:     I50.21 Acute systolic (congestive) heart failure  History:         Patient has prior history of  Echocardiogram examinations,  most recent 10/06/2017.  Sonographer:     Eva Lash Referring Phys:  [phone] MICHAEL COOPER Diagnosing Phys: Darryle Decent MD  IMPRESSIONS   1. Akinesis of the mid septum into apex and inferior septum consistent with LAD infarction. LVEF 15-20%. Cannot exclude a layered LV thrombus on contrast imaging. Consider cardiac MRI for definitive evaluation. LVOT VTI 9 cm. CO estimated 1.8 L/min and CI 0.8 L/min/m2, concerning for low output state. Left ventricular ejection fraction, by estimation, is 15-20%. The left ventricle has severely decreased function. The left ventricle demonstrates regional wall motion abnormalities (see scoring diagram/findings for description). There is moderate concentric left ventricular hypertrophy. Indeterminate diastolic filling due to E-A fusion. 2. Severely calcified aortic valve with restricted leaflet movement. Vmax 2.6 m/s, MG 16 mmHG, AVA 0.81 cm2, DI 0.24. This is concerning for low flow low gradient severe aortic stenosis. Recommend clinical correlation. The aortic valve is calcified. Aortic valve regurgitation is trivial. Moderate to severe aortic valve stenosis. 3. Right ventricular systolic function is normal. The right ventricular size is mildly enlarged. Tricuspid regurgitation signal is inadequate for assessing PA pressure. 4. The mitral valve is degenerative. Mild mitral valve regurgitation. No evidence of mitral stenosis. 5. The inferior vena cava is dilated in size with >50% respiratory variability, suggesting right atrial pressure of 8 mmHg.  FINDINGS Left Ventricle: Akinesis of the mid septum into apex and inferior septum consistent with LAD infarction. LVEF 15-20%. Cannot exclude a layered LV thrombus on contrast imaging. Consider cardiac MRI for definitive evaluation. LVOT VTI 9 cm. CO estimated 1.8 L/min and CI 0.8 L/min/m2, concerning for low output state. Left ventricular ejection fraction, by estimation, is 15-20%. The  left ventricle has severely decreased function. The left ventricle demonstrates regional wall motion abnormalities. Definity  contrast agent was given IV to delineate the left ventricular endocardial borders. Strain was performed and the global longitudinal strain is indeterminate. The left ventricular internal cavity size was normal in size. There is moderate concentric left ventricular hypertrophy. Indeterminate diastolic filling due to E-A fusion.   LV Wall Scoring: The mid inferoseptal segment, apical septal segment, apical anterior segment, apical inferior segment, and apex are akinetic.  Right Ventricle: The right ventricular size is mildly enlarged. No increase in right ventricular wall thickness. Right ventricular systolic function is normal. Tricuspid regurgitation signal is inadequate for assessing PA pressure.  Left Atrium: Left atrial size was normal in size.  Right Atrium: Right atrial size was normal in size.  Pericardium: There is no evidence of pericardial effusion.  Mitral Valve: The mitral valve is degenerative in appearance. Mild mitral valve regurgitation. No evidence of mitral valve stenosis. MV peak gradient, 3.9 mmHg. The mean mitral valve gradient is 2.0 mmHg.  Tricuspid Valve: The tricuspid valve is grossly normal. Tricuspid valve regurgitation is mild . No evidence of tricuspid stenosis.  Aortic Valve: Severely calcified aortic valve with restricted leaflet movement. Vmax 2.6 m/s, MG 16 mmHG, AVA 0.81 cm2, DI 0.24. This is concerning for low flow low gradient severe aortic stenosis. Recommend clinical correlation. The aortic valve is calcified. Aortic valve regurgitation is trivial. Moderate to severe aortic stenosis is present. Aortic valve mean gradient measures 16.0 mmHg. Aortic valve peak gradient measures 27.2 mmHg. Aortic valve area, by VTI measures 0.81 cm.  Pulmonic Valve: The pulmonic valve was grossly normal. Pulmonic valve regurgitation is not visualized.  No evidence of pulmonic stenosis.  Aorta: The aortic root and ascending aorta are structurally normal, with no evidence of dilitation.  Venous: The inferior  vena cava is dilated in size with greater than 50% respiratory variability, suggesting right atrial pressure of 8 mmHg.  IAS/Shunts: The atrial septum is grossly normal.   LEFT VENTRICLE PLAX 2D LVIDd:         3.60 cm      Diastology LVIDs:         3.30 cm      LV e' lateral: 8.70 cm/s LV PW:         1.50 cm LV IVS:        1.40 cm LVOT diam:     2.20 cm LV SV:         36 LV SV Index:   17 LVOT Area:     3.80 cm  LV Volumes (MOD) LV vol d, MOD A2C: 177.0 ml LV vol d, MOD A4C: 170.0 ml LV vol s, MOD A2C: 138.0 ml LV vol s, MOD A4C: 108.0 ml LV SV MOD A2C:     39.0 ml LV SV MOD A4C:     170.0 ml LV SV MOD BP:      55.4 ml  RIGHT VENTRICLE RV S prime:     10.30 cm/s TAPSE (M-mode): 1.4 cm  LEFT ATRIUM           Index LA Vol (A2C): 88.8 ml 41.37 ml/m LA Vol (A4C): 55.5 ml 25.86 ml/m AORTIC VALVE AV Area (Vmax):    0.82 cm AV Area (Vmean):   0.65 cm AV Area (VTI):     0.81 cm AV Vmax:           261.00 cm/s AV Vmean:          187.000 cm/s AV VTI:            0.450 m AV Peak Grad:      27.2 mmHg AV Mean Grad:      16.0 mmHg LVOT Vmax:         56.00 cm/s LVOT Vmean:        32.200 cm/s LVOT VTI:          0.096 m LVOT/AV VTI ratio: 0.21  AORTA Ao Asc diam: 3.40 cm  MITRAL VALVE MV Area VTI:  1.81 cm   SHUNTS MV Peak grad: 3.9 mmHg   Systemic VTI:  0.10 m MV Mean grad: 2.0 mmHg   Systemic Diam: 2.20 cm MV Vmax:      0.99 m/s MV Vmean:     67.5 cm/s  Darryle Decent MD Electronically signed by Darryle Decent MD Signature Date/Time: 02/16/2024/11:50:50 AM    Final (Updated)      CT SCANS  CT CORONARY MORPH W/CTA COR W/SCORE 02/25/2024  Addendum 02/25/2024  5:02 PM ADDENDUM REPORT: 02/25/2024 17:00  CLINICAL DATA:  Aortic stenosis  EXAM: Cardiac TAVR CT  TECHNIQUE: The patient was scanned on a  Siemens Force 192 slice scanner. A 120 kV retrospective scan was triggered in the descending thoracic aorta at 111 HU's. Gantry rotation speed was 270 msecs and collimation was .9 mm. No beta blockade or nitro were given. The 3D data set was reconstructed in 5% intervals of the R-R cycle. Systolic and diastolic phases were analyzed on a dedicated work station using MPR, MIP and VRT modes. The patient received 80 cc of contrast.  FINDINGS: Aortic Valve: Calcified tri leaflet AV with score 2590  Aorta: Mild dilatation of the ascending thoracic aorta. Severe calcific atherosclerosis Normal arch vessels  Sinotubular Junction: 34.4 mm  Ascending Thoracic Aorta: 38.4 mm  Aortic Arch:  25 mm  Descending Thoracic Aorta: 30.2 mm  Sinus of Valsalva Measurements:  Non-coronary: 36.9 mm  Height 25.9 mm  Right - coronary: 37.8 mm  Height 23.2 mm  Left - coronary: 37.6 mm  Height 17.3 mm  Coronary Artery Height above Annulus:  Left Main: 13.2 mm above annulus  Right Coronary: 18.5 mm above annulus  Virtual Basal Annulus Measurements:  Maximum/Minimum Diameter: 29.8 mm x 23.2 mm Average diameter 26.5 mm  Perimeter: 85.5 mm  Area: 553 mm2  Coronary Arteries: Sufficient height above annulus for deployment LM somewhat shallow  Optimum Fluoroscopic Angle for Delivery: LAO 14 Caudal 9 degrees  IMPRESSION: 1. Calcified tri leaflet AV with score 2590  2. Annular area of 553 mm2 suitable for a 29 mm Sapien 3 valve Alternatively a 34 mm Medtronic Evolut can be considered  3. Optimum angiographic angle for deployment LAO 14 Caudal 9 degrees  4. Coronary arteries suitable height above annulus for deployment although LM somewhat shallow at 13.2 mm  5.  Membranous septal length 6.3 mm  Maude Emmer   Electronically Signed By: Maude Emmer M.D. On: 02/25/2024 17:00  Narrative CLINICAL DATA:  Preprocedural evaluation, TAVR, severe aortic stenosis  EXAM: CT ANGIOGRAPHY  CHEST, ABDOMEN AND PELVIS  TECHNIQUE: Multidetector CT imaging through the chest, abdomen and pelvis was performed using the standard protocol during bolus administration of intravenous contrast. Multiplanar reconstructed images and MIPs were obtained and reviewed to evaluate the vascular anatomy.  RADIATION DOSE REDUCTION: This exam was performed according to the departmental dose-optimization program which includes automated exposure control, adjustment of the mA and/or kV according to patient size and/or use of iterative reconstruction technique.  CONTRAST:  95mL OMNIPAQUE  IOHEXOL  350 MG/ML SOLN  COMPARISON:  None Available.  FINDINGS: CTA CHEST FINDINGS  VASCULAR  Aorta: Satisfactory opacification of the aorta. Normal contour and caliber of the thoracic aorta. No evidence of aneurysm, dissection, or other acute aortic pathology. Moderate mixed aortic atherosclerosis. Aortic valve calcifications.  Cardiovascular: No evidence of pulmonary embolism on limited non-tailored examination. Cardiomegaly. Three-vessel coronary artery calcifications. Enlargement of the main pulmonary artery measuring up to 3.7 cm in caliber. No pericardial effusion. Right upper extremity PICC, tip directed into the azygous vein (series 3, image 116).  Review of the MIP images confirms the above findings.  NON VASCULAR  Mediastinum/Nodes: Numerous prominent mediastinal and hilar lymph nodes measuring up to 2.1 x 1.2 cm (series 3, image 120). Large hiatal hernia with intrathoracic position of the gastric body and fundus. Thyroid gland, trachea, and esophagus demonstrate no significant findings.  Lungs/Pleura: Multifocal bilateral heterogeneous and consolidative airspace disease, particularly conspicuous in the superior segment right lower lobe (series 5, image 55) although present in all lobes. Mild diffuse bilateral bronchial wall thickening. Compressive atelectasis of the left lower lobe  secondary to hernia sac. No pleural effusion or pneumothorax.  Musculoskeletal: No chest wall abnormality. No acute osseous findings.  Review of the MIP images confirms the above findings.  CTA ABDOMEN AND PELVIS FINDINGS  VASCULAR  Normal contour and caliber of the abdominal aorta. No evidence of aneurysm, dissection, or other acute aortic pathology. Standard branching pattern of the abdominal aorta with solitary bilateral renal arteries. Moderate mixed calcific atherosclerosis.  Review of the MIP images confirms the above findings.  NON-VASCULAR  Hepatobiliary: No solid liver abnormality is seen. Coarse contour of the liver. Gallstones. No gallbladder wall thickening, or biliary dilatation.  Pancreas: Unremarkable. No pancreatic ductal dilatation or surrounding inflammatory changes.  Spleen: Normal  in size without significant abnormality.  Adrenals/Urinary Tract: Adrenal glands are unremarkable. Lobulated renal cortical scarring, particularly in the superior pole of the right kidney. Punctuate nonobstructive calculus in the superior pole of the right kidney (series 6, image 66). Bladder is unremarkable.  Stomach/Bowel: Stomach is within normal limits. Appendix appears normal. No evidence of bowel wall thickening, distention, or inflammatory changes. Pancolonic diverticulosis.  Lymphatic: No enlarged abdominal or pelvic lymph nodes.  Reproductive: No mass or other significant abnormality.  Other: Small, fat containing bilateral inguinal hernias. No ascites.  Musculoskeletal: No acute osseous findings.  IMPRESSION: 1. Normal contour and caliber of the thoracic and abdominal aorta. No evidence of aneurysm, dissection, or other acute aortic pathology. Moderate mixed calcific atherosclerosis throughout. 2. Aortic valve calcifications in keeping with aortic stenosis. 3. Cardiomegaly and three-vessel coronary artery disease. 4. Multifocal bilateral heterogeneous and  consolidative airspace disease, particularly conspicuous in the superior segment right lower lobe although present in all lobes. Findings are consistent with nonspecific multifocal infection or inflammation. 5. Numerous prominent mediastinal and hilar lymph nodes, likely reactive. 6. Coarse contour of the liver, suggestive of cirrhosis. 7. Right upper extremity PICC, tip directed into the azygous vein. Consider repositioning. 8. Multiple other chronic and incidental findings as detailed above.  Aortic Atherosclerosis (ICD10-I70.0).  Electronically Signed: By: Marolyn JONETTA Jaksch M.D. On: 02/25/2024 13:23   CARDIAC MRI  MR CARDIAC MORPHOLOGY W WO CONTRAST 04/27/2024  Narrative CLINICAL DATA:  55M p/w STEMI 01/2024, underwent PCI to LAD. Echo with EF 20%, LV thrombus, severe AS. Had planned inpatient TAVR but canceled due to LV thrombus. CMR 02/25/24 showed LVEF 32%, RVEF 44%, mild AI, moderate MR, small LV thrombus. Repeat CMR today to evaluate for resolution of LV thrombus so can proceed with TAVR  EXAM: CARDIAC MRI  TECHNIQUE: The patient was scanned on a 1.5 Tesla Siemens magnet. A dedicated cardiac coil was used. Functional imaging was done using Fiesta sequences. 2,3, and 4 chamber views were done to assess for RWMA's. Modified Simpson's rule using a short axis stack was used to calculate an ejection fraction on a dedicated work [employer]. The patient received 10 cc of Gadavist . After 10 minutes inversion recovery sequences were used to assess for infiltration and scar tissue. Phase contrast velocity mapping was performed above the aortic and pulmonic valves  CONTRAST:  10 cc  of Gadavist   FINDINGS: Left ventricle:  -Mild dilatation  -Moderate systolic dysfunction  -Elevated ECV (35%)  -Subendocardial LGE in mid to apical anterior/anteroseptal walls, apical inferior wall, and apex. LGE is greater than 50% transmural suggesting region is  nonviable  -No LV thrombus  LV EF: 37% (Normal 49-79%)  Absolute volumes:  LV EDV: (Normal 95-215 mL)  LV ESV: (Normal 25-85 mL)  LV SV: 86mL (Normal 61-145 mL)  CO: 4.2L/min (Normal 3.4-7.8 L/min)  Indexed volumes:  LV EDV: 19mL/sq-m (Normal 50-108 mL/sq-m)  LV ESV: 43mL/sq-m (Normal 11-47 mL/sq-m)  LV SV: 43mL/sq-m (Normal 33-72 mL/sq-m)  CI: 2.0L/min/sq-m (Normal 1.8-4.2 L/min/sq-m)  Right ventricle: Normal size with mild systolic dysfunction  RV EF:  54% (Normal 51-80%)  Absolute volumes:  RV EDV: (Normal 109-217 mL)  RV ESV: 70mL (Normal 23-91 mL)  RV SV: 56mL (Normal 71-141 mL)  CO: 1.3L/min (Normal 2.8-8.8 L/min)  Indexed volumes:  RV EDV: 59mL/sq-m (Normal 58-109 mL/sq-m)  RV ESV: 75mL/sq-m (Normal 12-46 mL/sq-m)  RV SV: 24mL/sq-m (Normal 38-71 mL/sq-m)  CI: 1.3L/min/sq-m (Normal 1.7-4.2 L/min/sq-m)  Left atrium: Severe enlargement  Right atrium: Mild enlargement  Mitral valve: Moderate regurgitation (regurgitant fraction 27%)  Aortic valve: Trivial regurgitation.  Visually severe stenosis  Tricuspid valve: Trivial regurgitation  Pulmonic valve: Trivial regurgitation  Aorta: Dilated ascending aorta measuring 40mm  Pulmonary artery: Dilated main pulmonary artery measuring 34mm  Pericardium: Normal  Extracardiac structures: Bilateral airspace disease. Large hiatal hernia  IMPRESSION: 1.  No LV thrombus seen  2. Subendocardial LGE in mid to apical anterior/anteroseptal walls, apical inferior wall, and apex. This is consistent with prior LAD territory infarct. LGE is greater than 50% transmural suggesting region is nonviable  3.  Mild LV dilatation with moderate systolic dysfunction (EF 37%)  4.  Normal RV size with mild systolic dysfunction (EF 45%)  5.  Moderate mitral regurgitation (regurgitant fraction 27%)  6.  Dilated main pulmonary artery measuring 34mm  7.  Dilated ascending aorta measuring  40mm   Electronically Signed By: Lonni Nanas M.D. On: 04/27/2024 13:15   ______________________________________________________________________________________________      ECG afib    I have independently reviewed the above radiologic studies and discussed with the patient   Recent Lab Findings: Lab Results  Component Value Date   WBC 16.1 (H) 02/29/2024   HGB 9.6 (L) 02/29/2024   HCT 33.4 (L) 02/29/2024   PLT 345 02/29/2024   GLUCOSE 134 (H) 03/29/2024   CHOL 86 02/16/2024   TRIG 67 02/16/2024   HDL 18 (L) 02/16/2024   LDLCALC 55 02/16/2024   ALT 21 02/15/2024   AST 115 (H) 02/15/2024   NA 137 03/29/2024   K 4.6 03/29/2024   CL 104 03/29/2024   CREATININE 1.84 (H) 03/29/2024   BUN 23 03/29/2024   CO2 21 (L) 03/29/2024   TSH 6.656 (H) 02/15/2024   INR 1.2 02/15/2024   HGBA1C 6.4 (H) 02/15/2024      Assessment / Plan:   73 y.o. male with severe aortic stenosis.  STS score: 20.6.  NYHA Class III.  He also has an ejection fraction 32% cardiac.  The LV thrombus is resolved.  The risks and benefits of transfemoral TAVR were discussed in detail.  We also discussed possibility of an emergent sternotomy to address any procedural complications.  Based on our discussion, we collectively decided that an emergent sternotomy would not be indicated.  The patient is agreeable to proceed.  Based on my review of her LHC, echo, and CTA, I agree with the multidisciplinary plan to proceed with a 29mm SAPIEN 3 TAVR.      I  spent 40 minutes counseling the patient face to face.   Linnie MALVA Rayas 06/17/2024 10:21 AM

## 2024-06-22 ENCOUNTER — Other Ambulatory Visit: Payer: Self-pay

## 2024-06-22 DIAGNOSIS — I350 Nonrheumatic aortic (valve) stenosis: Secondary | ICD-10-CM

## 2024-07-01 ENCOUNTER — Ambulatory Visit (HOSPITAL_COMMUNITY)
Admission: RE | Admit: 2024-07-01 | Discharge: 2024-07-01 | Disposition: A | Discharge status: Home / Self Care | Source: Ambulatory Visit | Attending: Cardiovascular Disease | Admitting: Cardiovascular Disease

## 2024-07-01 ENCOUNTER — Ambulatory Visit: Payer: Self-pay | Admitting: Cardiovascular Disease

## 2024-07-01 ENCOUNTER — Other Ambulatory Visit: Payer: Self-pay

## 2024-07-01 ENCOUNTER — Encounter (HOSPITAL_COMMUNITY)
Admission: RE | Admit: 2024-07-01 | Discharge: 2024-07-01 | Disposition: A | Discharge status: Home / Self Care | Source: Ambulatory Visit | Attending: Cardiovascular Disease | Admitting: Cardiovascular Disease

## 2024-07-01 DIAGNOSIS — I350 Nonrheumatic aortic (valve) stenosis: Secondary | ICD-10-CM | POA: Diagnosis present

## 2024-07-01 DIAGNOSIS — Z01818 Encounter for other preprocedural examination: Secondary | ICD-10-CM | POA: Insufficient documentation

## 2024-07-01 LAB — COMPREHENSIVE METABOLIC PANEL WITH GFR
ALT: 13 U/L (ref 0–44)
AST: 21 U/L (ref 15–41)
Albumin: 3.8 g/dL (ref 3.5–5.0)
Alkaline Phosphatase: 55 U/L (ref 38–126)
Anion gap: 14 (ref 5–15)
BUN: 22 mg/dL (ref 8–23)
CO2: 21 mmol/L — ABNORMAL LOW (ref 22–32)
Calcium: 9.7 mg/dL (ref 8.9–10.3)
Chloride: 102 mmol/L (ref 98–111)
Creatinine, Ser: 1.87 mg/dL — ABNORMAL HIGH (ref 0.61–1.24)
GFR, Estimated: 37 mL/min — ABNORMAL LOW (ref >60)
Glucose, Bld: 174 mg/dL — ABNORMAL HIGH (ref 70–99)
Potassium: 4.4 mmol/L (ref 3.5–5.1)
Sodium: 137 mmol/L (ref 135–145)
Total Bilirubin: 0.7 mg/dL (ref 0.0–1.2)
Total Protein: 8.8 g/dL — ABNORMAL HIGH (ref 6.5–8.1)

## 2024-07-01 LAB — URINALYSIS, ROUTINE W REFLEX MICROSCOPIC
Bilirubin Urine: NEGATIVE
Glucose, UA: >=500 mg/dL — AB
Ketones, ur: NEGATIVE mg/dL
Nitrite: POSITIVE — AB
Protein, ur: NEGATIVE mg/dL
Specific Gravity, Urine: 1.02 (ref 1.005–1.030)
pH: 5 (ref 5.0–8.0)

## 2024-07-01 LAB — SURGICAL PCR SCREEN
MRSA, PCR: NEGATIVE
Staphylococcus aureus: POSITIVE — AB

## 2024-07-01 LAB — CBC
HCT: 39 % (ref 39.0–52.0)
Hemoglobin: 11.4 g/dL — ABNORMAL LOW (ref 13.0–17.0)
MCH: 23.8 pg — ABNORMAL LOW (ref 26.0–34.0)
MCHC: 29.2 g/dL — ABNORMAL LOW (ref 30.0–36.0)
MCV: 81.3 fL (ref 80.0–100.0)
Platelets: 295 K/uL (ref 150–400)
RBC: 4.8 MIL/uL (ref 4.22–5.81)
RDW: 17.7 % — ABNORMAL HIGH (ref 11.5–15.5)
WBC: 10.4 K/uL (ref 4.0–10.5)
nRBC: 0 % (ref 0.0–0.2)

## 2024-07-01 LAB — PROTIME-INR
INR: 1.2 (ref 0.8–1.2)
Prothrombin Time: 15.8 s — ABNORMAL HIGH (ref 11.4–15.2)

## 2024-07-01 LAB — TYPE AND SCREEN
ABO/RH(D): O POS
Antibody Screen: NEGATIVE

## 2024-07-01 NOTE — Progress Notes (Signed)
 All consents signed by patient at PAT lab appointment. Pt was sent home with printed copy of surgical instructions and CHG soap/CHG soap instructions. All instructions reviewed with patient and questions answered.  Patients chart send to anesthesia for review. Pt denies any respiratory illness/infection in the last two months.

## 2024-07-04 MED ORDER — MAGNESIUM SULFATE 50 % IJ SOLN
40.0000 meq | INTRAMUSCULAR | Status: DC
  Filled 2024-07-04 (×2): qty 9.85

## 2024-07-04 MED ORDER — NOREPINEPHRINE 4 MG/250ML-% IV SOLN
0.0000 ug/min | INTRAVENOUS | Status: AC
  Administered 2024-07-05: 2 ug/min via INTRAVENOUS
  Filled 2024-07-04: qty 250

## 2024-07-04 MED ORDER — POTASSIUM CHLORIDE 2 MEQ/ML IV SOLN
80.0000 meq | INTRAVENOUS | Status: DC
  Filled 2024-07-04 (×2): qty 40

## 2024-07-04 MED ORDER — CEFAZOLIN SODIUM-DEXTROSE 2-4 GM/100ML-% IV SOLN
2.0000 g | INTRAVENOUS | Status: AC
  Administered 2024-07-05: 2 g via INTRAVENOUS
  Filled 2024-07-04 (×2): qty 100

## 2024-07-04 MED ORDER — DEXMEDETOMIDINE HCL IN NACL 400 MCG/100ML IV SOLN
0.1000 ug/kg/h | INTRAVENOUS | Status: AC
  Administered 2024-07-05: 1 ug/kg/h via INTRAVENOUS
  Filled 2024-07-04: qty 100

## 2024-07-04 MED ORDER — HEPARIN 30,000 UNITS/1000 ML (OHS) CELLSAVER SOLUTION
Status: DC
  Filled 2024-07-04 (×2): qty 1000

## 2024-07-05 ENCOUNTER — Encounter (HOSPITAL_COMMUNITY): Admission: RE | Disposition: A | Payer: Self-pay | Source: Home / Self Care | Attending: Cardiovascular Disease

## 2024-07-05 ENCOUNTER — Inpatient Hospital Stay (HOSPITAL_COMMUNITY)

## 2024-07-05 ENCOUNTER — Other Ambulatory Visit: Payer: Self-pay

## 2024-07-05 ENCOUNTER — Encounter (HOSPITAL_COMMUNITY): Payer: Self-pay | Admitting: Cardiovascular Disease

## 2024-07-05 ENCOUNTER — Inpatient Hospital Stay (HOSPITAL_COMMUNITY)
Admission: RE | Admit: 2024-07-05 | Discharge: 2024-07-06 | DRG: 267 | Disposition: A | Discharge status: Home / Self Care | Attending: Cardiovascular Disease | Admitting: Cardiovascular Disease

## 2024-07-05 ENCOUNTER — Inpatient Hospital Stay (HOSPITAL_COMMUNITY): Admitting: Certified Registered Nurse Anesthetist

## 2024-07-05 ENCOUNTER — Inpatient Hospital Stay (HOSPITAL_COMMUNITY): Admitting: Physician Assistant

## 2024-07-05 DIAGNOSIS — M109 Gout, unspecified: Secondary | ICD-10-CM | POA: Diagnosis present

## 2024-07-05 DIAGNOSIS — N184 Chronic kidney disease, stage 4 (severe): Secondary | ICD-10-CM | POA: Diagnosis present

## 2024-07-05 DIAGNOSIS — I350 Nonrheumatic aortic (valve) stenosis: Secondary | ICD-10-CM | POA: Diagnosis present

## 2024-07-05 DIAGNOSIS — Z006 Encounter for examination for normal comparison and control in clinical research program: Secondary | ICD-10-CM

## 2024-07-05 DIAGNOSIS — K219 Gastro-esophageal reflux disease without esophagitis: Secondary | ICD-10-CM | POA: Diagnosis present

## 2024-07-05 DIAGNOSIS — E039 Hypothyroidism, unspecified: Secondary | ICD-10-CM | POA: Diagnosis present

## 2024-07-05 DIAGNOSIS — Z7901 Long term (current) use of anticoagulants: Secondary | ICD-10-CM | POA: Diagnosis not present

## 2024-07-05 DIAGNOSIS — Z955 Presence of coronary angioplasty implant and graft: Secondary | ICD-10-CM | POA: Diagnosis not present

## 2024-07-05 DIAGNOSIS — F1011 Alcohol abuse, in remission: Secondary | ICD-10-CM | POA: Diagnosis present

## 2024-07-05 DIAGNOSIS — I5022 Chronic systolic (congestive) heart failure: Secondary | ICD-10-CM | POA: Diagnosis present

## 2024-07-05 DIAGNOSIS — E669 Obesity, unspecified: Secondary | ICD-10-CM | POA: Diagnosis present

## 2024-07-05 DIAGNOSIS — Z8249 Family history of ischemic heart disease and other diseases of the circulatory system: Secondary | ICD-10-CM | POA: Diagnosis not present

## 2024-07-05 DIAGNOSIS — E119 Type 2 diabetes mellitus without complications: Secondary | ICD-10-CM

## 2024-07-05 DIAGNOSIS — K709 Alcoholic liver disease, unspecified: Secondary | ICD-10-CM | POA: Diagnosis present

## 2024-07-05 DIAGNOSIS — I447 Left bundle-branch block, unspecified: Principal | ICD-10-CM | POA: Diagnosis present

## 2024-07-05 DIAGNOSIS — Z7902 Long term (current) use of antithrombotics/antiplatelets: Secondary | ICD-10-CM

## 2024-07-05 DIAGNOSIS — I2510 Atherosclerotic heart disease of native coronary artery without angina pectoris: Secondary | ICD-10-CM

## 2024-07-05 DIAGNOSIS — Z79899 Other long term (current) drug therapy: Secondary | ICD-10-CM

## 2024-07-05 DIAGNOSIS — Z87891 Personal history of nicotine dependence: Secondary | ICD-10-CM

## 2024-07-05 DIAGNOSIS — E785 Hyperlipidemia, unspecified: Secondary | ICD-10-CM | POA: Diagnosis present

## 2024-07-05 DIAGNOSIS — Z952 Presence of prosthetic heart valve: Secondary | ICD-10-CM

## 2024-07-05 DIAGNOSIS — Z7984 Long term (current) use of oral hypoglycemic drugs: Secondary | ICD-10-CM

## 2024-07-05 DIAGNOSIS — Z6830 Body mass index (BMI) 30.0-30.9, adult: Secondary | ICD-10-CM

## 2024-07-05 DIAGNOSIS — I252 Old myocardial infarction: Secondary | ICD-10-CM | POA: Diagnosis not present

## 2024-07-05 DIAGNOSIS — I4819 Other persistent atrial fibrillation: Secondary | ICD-10-CM | POA: Diagnosis present

## 2024-07-05 DIAGNOSIS — E1122 Type 2 diabetes mellitus with diabetic chronic kidney disease: Secondary | ICD-10-CM | POA: Diagnosis present

## 2024-07-05 DIAGNOSIS — I255 Ischemic cardiomyopathy: Secondary | ICD-10-CM | POA: Diagnosis present

## 2024-07-05 DIAGNOSIS — I130 Hypertensive heart and chronic kidney disease with heart failure and stage 1 through stage 4 chronic kidney disease, or unspecified chronic kidney disease: Secondary | ICD-10-CM | POA: Diagnosis present

## 2024-07-05 DIAGNOSIS — F1020 Alcohol dependence, uncomplicated: Secondary | ICD-10-CM | POA: Diagnosis present

## 2024-07-05 DIAGNOSIS — I10 Essential (primary) hypertension: Secondary | ICD-10-CM

## 2024-07-05 DIAGNOSIS — R4189 Other symptoms and signs involving cognitive functions and awareness: Secondary | ICD-10-CM | POA: Diagnosis present

## 2024-07-05 DIAGNOSIS — Z79890 Hormone replacement therapy (postmenopausal): Secondary | ICD-10-CM

## 2024-07-05 HISTORY — DX: Nonrheumatic aortic (valve) stenosis: I35.0

## 2024-07-05 HISTORY — PX: INTRAOPERATIVE TRANSTHORACIC ECHOCARDIOGRAM: SHX6523

## 2024-07-05 HISTORY — DX: Alcohol abuse, in remission: F10.11

## 2024-07-05 HISTORY — DX: Type 2 diabetes mellitus without complications: E11.9

## 2024-07-05 HISTORY — DX: Atherosclerotic heart disease of native coronary artery without angina pectoris: I25.10

## 2024-07-05 HISTORY — DX: Chronic kidney disease, stage 4 (severe): N18.4

## 2024-07-05 HISTORY — DX: Paroxysmal atrial fibrillation: I48.0

## 2024-07-05 LAB — GLUCOSE, CAPILLARY: Glucose-Capillary: 161 mg/dL — ABNORMAL HIGH (ref 70–99)

## 2024-07-05 LAB — POCT I-STAT, CHEM 8
BUN: 19 mg/dL (ref 8–23)
Calcium, Ion: 1.21 mmol/L (ref 1.15–1.40)
Chloride: 102 mmol/L (ref 98–111)
Creatinine, Ser: 1.6 mg/dL — ABNORMAL HIGH (ref 0.61–1.24)
Glucose, Bld: 167 mg/dL — ABNORMAL HIGH (ref 70–99)
HCT: 32 % — ABNORMAL LOW (ref 39.0–52.0)
Hemoglobin: 10.9 g/dL — ABNORMAL LOW (ref 13.0–17.0)
Potassium: 4.1 mmol/L (ref 3.5–5.1)
Sodium: 138 mmol/L (ref 135–145)
TCO2: 23 mmol/L (ref 22–32)

## 2024-07-05 LAB — ECHOCARDIOGRAM LIMITED
AR max vel: 2.28 cm2
AV Area VTI: 2.14 cm2
AV Area mean vel: 2.37 cm2
AV Mean grad: 3 mmHg
AV Peak grad: 5.2 mmHg
Ao pk vel: 1.14 m/s
S' Lateral: 4.3 cm

## 2024-07-05 LAB — POCT ACTIVATED CLOTTING TIME: Activated Clotting Time: 262 s

## 2024-07-05 MED ORDER — FENTANYL CITRATE (PF) 100 MCG/2ML IJ SOLN
INTRAMUSCULAR | Status: AC
  Filled 2024-07-05: qty 2

## 2024-07-05 MED ORDER — PROTAMINE SULFATE 10 MG/ML IV SOLN
INTRAVENOUS | Status: DC | PRN
  Administered 2024-07-05: 90 mg via INTRAVENOUS
  Administered 2024-07-05: 10 mg via INTRAVENOUS

## 2024-07-05 MED ORDER — SODIUM CHLORIDE 0.9 % IV SOLN: INTRAVENOUS | Status: DC | PRN

## 2024-07-05 MED ORDER — SPIRONOLACTONE 25 MG PO TABS
25.0000 mg | ORAL_TABLET | Freq: Every day | ORAL | Status: DC
  Administered 2024-07-05 – 2024-07-06 (×2): 25 mg via ORAL
  Filled 2024-07-05 (×2): qty 1

## 2024-07-05 MED ORDER — LEVOTHYROXINE SODIUM 150 MCG PO TABS: 150.0000 ug | ORAL_TABLET | ORAL | Status: DC

## 2024-07-05 MED ORDER — PROPOFOL 10 MG/ML IV BOLUS
INTRAVENOUS | Status: DC | PRN
  Administered 2024-07-05: 20 mg via INTRAVENOUS

## 2024-07-05 MED ORDER — SODIUM CHLORIDE 0.9 % IV SOLN: 250.0000 mL | INTRAVENOUS | Status: DC | PRN

## 2024-07-05 MED ORDER — CLOPIDOGREL BISULFATE 75 MG PO TABS
75.0000 mg | ORAL_TABLET | Freq: Every day | ORAL | Status: DC
Start: 2024-07-06 — End: 2024-07-06
  Administered 2024-07-06: 75 mg via ORAL
  Filled 2024-07-05: qty 1

## 2024-07-05 MED ORDER — PROTAMINE SULFATE 10 MG/ML IV SOLN
INTRAVENOUS | Status: AC
  Filled 2024-07-05: qty 5

## 2024-07-05 MED ORDER — ACETAMINOPHEN 650 MG RE SUPP: 650.0000 mg | Freq: Four times a day (QID) | RECTAL | Status: DC | PRN

## 2024-07-05 MED ORDER — SODIUM CHLORIDE 0.9% FLUSH
3.0000 mL | Freq: Two times a day (BID) | INTRAVENOUS | Status: DC
  Administered 2024-07-05 – 2024-07-06 (×2): 3 mL via INTRAVENOUS

## 2024-07-05 MED ORDER — SODIUM CHLORIDE 0.9 % IV SOLN: INTRAVENOUS | Status: DC

## 2024-07-05 MED ORDER — CHLORHEXIDINE GLUCONATE 4 % EX SOLN: 60.0000 mL | Freq: Once | CUTANEOUS | Status: DC

## 2024-07-05 MED ORDER — LEVOTHYROXINE SODIUM 75 MCG PO TABS
150.0000 ug | ORAL_TABLET | Freq: Every day | ORAL | Status: DC
  Administered 2024-07-06: 150 ug via ORAL
  Filled 2024-07-05: qty 2

## 2024-07-05 MED ORDER — AMIODARONE HCL 200 MG PO TABS
200.0000 mg | ORAL_TABLET | Freq: Every day | ORAL | Status: DC
  Administered 2024-07-05 – 2024-07-06 (×2): 200 mg via ORAL
  Filled 2024-07-05 (×2): qty 1

## 2024-07-05 MED ORDER — CHLORHEXIDINE GLUCONATE 4 % EX SOLN
30.0000 mL | CUTANEOUS | Status: DC
  Filled 2024-07-05: qty 30

## 2024-07-05 MED ORDER — SODIUM CHLORIDE 0.9 % IV SOLN: INTRAVENOUS | Status: AC

## 2024-07-05 MED ORDER — FENTANYL CITRATE (PF) 100 MCG/2ML IJ SOLN
INTRAMUSCULAR | Status: DC | PRN
  Administered 2024-07-05 (×2): 25 ug via INTRAVENOUS

## 2024-07-05 MED ORDER — CHLORHEXIDINE GLUCONATE 0.12 % MT SOLN
15.0000 mL | Freq: Once | OROMUCOSAL | Status: AC
  Administered 2024-07-05: 15 mL via OROMUCOSAL
  Filled 2024-07-05: qty 15

## 2024-07-05 MED ORDER — SODIUM CHLORIDE 0.9 % IV SOLN
INTRAVENOUS | Status: DC | PRN
Start: 2024-07-05 — End: 2024-07-05

## 2024-07-05 MED ORDER — ACETAMINOPHEN 325 MG PO TABS
650.0000 mg | ORAL_TABLET | Freq: Four times a day (QID) | ORAL | Status: DC | PRN
  Administered 2024-07-06: 650 mg via ORAL
  Filled 2024-07-05: qty 2

## 2024-07-05 MED ORDER — OXYCODONE HCL 5 MG PO TABS: 5.0000 mg | ORAL_TABLET | ORAL | Status: DC | PRN

## 2024-07-05 MED ORDER — INSULIN ASPART 100 UNIT/ML IJ SOLN
0.0000 [IU] | Freq: Three times a day (TID) | INTRAMUSCULAR | Status: DC
  Administered 2024-07-05 – 2024-07-06 (×2): 4 [IU] via SUBCUTANEOUS
  Administered 2024-07-06: 8 [IU] via SUBCUTANEOUS

## 2024-07-05 MED ORDER — ROSUVASTATIN CALCIUM 20 MG PO TABS
40.0000 mg | ORAL_TABLET | Freq: Every day | ORAL | Status: DC
  Administered 2024-07-05 – 2024-07-06 (×2): 40 mg via ORAL
  Filled 2024-07-05 (×2): qty 2

## 2024-07-05 MED ORDER — TAMSULOSIN HCL 0.4 MG PO CAPS
0.4000 mg | ORAL_CAPSULE | Freq: Every day | ORAL | Status: DC
  Administered 2024-07-05 – 2024-07-06 (×2): 0.4 mg via ORAL
  Filled 2024-07-05 (×2): qty 1

## 2024-07-05 MED ORDER — HEPARIN SODIUM (PORCINE) 1000 UNIT/ML IJ SOLN
INTRAMUSCULAR | Status: DC | PRN
  Administered 2024-07-05: 13000 [IU] via INTRAVENOUS

## 2024-07-05 MED ORDER — ALLOPURINOL 100 MG PO TABS
200.0000 mg | ORAL_TABLET | Freq: Every day | ORAL | Status: DC
  Administered 2024-07-05 – 2024-07-06 (×2): 200 mg via ORAL
  Filled 2024-07-05 (×2): qty 2

## 2024-07-05 MED ORDER — MORPHINE SULFATE (PF) 2 MG/ML IV SOLN: 1.0000 mg | INTRAVENOUS | Status: DC | PRN

## 2024-07-05 MED ORDER — PANTOPRAZOLE SODIUM 40 MG PO TBEC
40.0000 mg | DELAYED_RELEASE_TABLET | Freq: Every day | ORAL | Status: DC
  Administered 2024-07-05 – 2024-07-06 (×2): 40 mg via ORAL
  Filled 2024-07-05 (×2): qty 1

## 2024-07-05 MED ORDER — CEFAZOLIN SODIUM-DEXTROSE 2-4 GM/100ML-% IV SOLN
2.0000 g | Freq: Three times a day (TID) | INTRAVENOUS | Status: AC
  Administered 2024-07-05 – 2024-07-06 (×2): 2 g via INTRAVENOUS
  Filled 2024-07-05 (×2): qty 100

## 2024-07-05 MED ORDER — SODIUM CHLORIDE 0.9% FLUSH: 3.0000 mL | INTRAVENOUS | Status: DC | PRN

## 2024-07-05 MED ORDER — LIDOCAINE HCL (PF) 1 % IJ SOLN
INTRAMUSCULAR | Status: DC | PRN
  Administered 2024-07-05 (×2): 2 mL

## 2024-07-05 MED ORDER — NITROGLYCERIN IN D5W 200-5 MCG/ML-% IV SOLN: 0.0000 ug/min | INTRAVENOUS | Status: DC

## 2024-07-05 NOTE — Discharge Summary (Incomplete)
 [facility]   MULTIDISCIPLINARY HEART VALVE TEAM  Discharge Summary    Patient ID: Mark Meadows MRN: [id number]; DOB: 05-Apr-1951  Admit date: 07/05/2024 Discharge date: 07/06/2024  PCP:  Haze Kingfisher, MD  [facility] Cardiologist:  Ezra Shuck, MD  [facility] Structural heart: [name], MD [facility] Electrophysiologist:  None   Discharge Diagnoses    Principal Problem:   S/P TAVR (transcatheter aortic valve replacement) Active Problems:   Hypothyroidism   Alcoholic liver disease   Cognitive impairment   Chronic kidney disease (CKD), stage 4 (HCC)   Alcohol abuse, in remission   CAD (coronary artery disease)   GERD (gastroesophageal reflux disease)   Diabetes mellitus (HCC)   Severe aortic stenosis   LBBB (left bundle branch block)   Allergies No Known Allergies  Diagnostic Studies/Procedures    [facility]ENTER  TAVR OPERATIVE NOTE     Date of Procedure:                07/05/2024   Preoperative Diagnosis:      Severe Aortic Stenosis    Postoperative Diagnosis:    Same    Procedure:        Transcatheter Aortic Valve Replacement - Transfemoral Approach             Edwards Sapien 3 Ultra Resilia THV (size 29 mm, model # [id number], serial # [id number] )              Co-Surgeons:                        [name], MD and Linnie Rayas , MD    Anesthesiologist:                  Leopoldo   Echocardiographer:              Burton   Pre-operative Echo Findings: Severe aortic stenosis Moderate left ventricular systolic dysfunction   Post-operative Echo Findings: No paravalvular leak Moderate left ventricular systolic dysfunction   _____________    Echo 07/06/24: completed but pending formal read at the time of discharge   History of Present Illness     Mark Meadows is a 73 y.o. 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   He was admitted in 01/2024 with a late presenting anterolateral STEMI complicated by cardiogenic shock and LV thrombus. Cath 02/15/24 with a late presenting anterolateral STEMI with 100% p to m LAD treated with PTCA/DES. 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 He was found to have severe aortic stenosis during his hospitalization with STEMI, but TAVR was initially deferred because of the presence of LV thrombus. Echo showed EF 15-20%, LV thrombus, normal RV and low flow low gradient severe AS; AVA 0.81 cm2 an, MG 16 mm hg, DI 0.24, SVI 17. Follow-up MRI April 27, 2024 showed mild LV dilatation with moderate LV systolic dysfunction, LVEF 37%, and resolution of LV apical thrombus.  The patient was evaluated by the multidisciplinary valve team and felt to have severe, symptomatic aortic stenosis and to be a suitable candidate for TAVR, which was set up for 07/05/24.   Hospital Course     Consultants: none   Severe AS:  -- S/p successful  TAVR with a 29 mm Edwards Sapien 3 Ultra Resilia THV via the TF approach on 07/05/24.  -- Post operative echo completed but pending formal read. -- Groin sites are stable.  -- Continue apixaban  5 mg twice a day + Plavix  75 mg daily for PAF and recent STEMI. -- Met with cardiac rehab to discuss CRP phase II.  -- Plan for discharge home today with close follow up in the outpatient setting.   New LBBB: -- ECG/tele with new LBBB. -- Plan Zio AT to rule out delayed HAVB.   Heart Failure with reduced fraction -- Ischemic CMP / valvular heart disease -- LVEDP 14 mm hg at the time of TAVR. -- Appears euvolemic.  -- Continue Lasix  40mg  PRN.  -- Continue spironolactone  12.5mg  daily  -- Continue Farxiga  10mg  daily   CAD -- STEMI 02/15/24 and cath showed 100% prox to mid LAD lesion s/p DES. -- No  chest pain.  -- Continue apixaban  5 mg twice a day +Plavix  75 mg daily. -- Continue Crestor  40 mg daily.    LV Thrombus  -- Continue eliquis  5 mg twice a day.  -- Resolved on CMR on 04/27/24.   Persistent afib -- Continue apixaban  5mg  BID, amio 200mg  daily.  -- Has been referred to EP for consideration of ablation per Dr. Sharol last note, but I do not see a referral in place.    CKD Stage IIIb -- Creat Baseline ~ 1.5-2 -- Creat 1.69 on the day of discharge.  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  -- Currently having significant gout flare in knee.  -- Continue Allopurinol 200mg   -- Treated with colchicine ; 1.2mg  x 1 today; 0.6mg  one hour later while admitted.  -- Counseled against using ibuprofen which pt reports helps the most.  -- He was referred to rheumatology given severity of his gout.  Abnormal CT of liver: -- Coarse contour of the liver, suggestive of cirrhosis noted on pre TAVR CTs.  -- Previously heavy drinker.   _____________  Discharge Vitals Blood pressure 115/63, pulse 60, temperature 98.1 F (36.7 C), temperature source Oral, resp. rate 18, height 5' 9 (1.753 m), weight 90.9 kg, SpO2 98%.  Filed Weights   07/04/24 1000 07/05/24 0904 07/06/24 0430  Weight: 93.9 kg 93.9 kg 90.9 kg     GEN: Well nourished, well developed in no acute distress NECK: No JVD CARDIAC: irreg irreg, no murmurs, rubs, gallops RESPIRATORY:  Clear to auscultation without rales, wheezing or rhonchi  ABDOMEN: Soft, non-tender, non-distended EXTREMITIES:  No edema; No deformity.  Groin sites clear without hematoma or ecchymosis.   Disposition   Pt is being discharged home today in good condition.  Follow-up Plans & Appointments     Follow-up Information     Sebastian Lamarr SAUNDERS, PA-C. Go on 07/11/2024.   Specialties: Cardiology, Radiology Why: @ 10:20am, please arrive at least 20 minutes early Contact information: [address] [place]  [address] [phone]                  Discharge Medications   Allergies as of 07/06/2024   No Known Allergies      Medication List     STOP taking these medications    colchicine  0.6 MG tablet       TAKE these medications    allopurinol 100 MG tablet Commonly known as: ZYLOPRIM Take 200 mg by mouth daily.   amiodarone  200 MG tablet Commonly known as: PACERONE  Take 1 tablet (  200 mg total) by mouth daily.   apixaban  5 MG Tabs tablet Commonly known as: ELIQUIS  Take 1 tablet (5 mg total) by mouth 2 (two) times daily.   clopidogrel  75 MG tablet Commonly known as: PLAVIX  Take 1 tablet (75 mg total) by mouth daily with breakfast.   digoxin  0.125 MG tablet Commonly known as: LANOXIN  Take 1 tablet (0.125 mg total) by mouth daily.   Farxiga  10 MG Tabs tablet Generic drug: dapagliflozin  propanediol Take 1 tablet (10 mg total) by mouth daily.   furosemide  40 MG tablet Commonly known as: Lasix  Take 1 tablet by mouth as needed for weight gain, lower extremity edema, increased shortness of breath   levothyroxine  150 MCG tablet Commonly known as: SYNTHROID  Take 150-300 mcg by mouth See admin instructions. Take 150mcg (1 tablet) by mouth 5 days a week, then take 300mcg (2 tablets) 2 days a week.   pantoprazole  40 MG tablet Commonly known as: PROTONIX  Take 1 tablet (40 mg total) by mouth daily.   rosuvastatin  40 MG tablet Commonly known as: CRESTOR  TAKE 1 TABLET BY MOUTH EVERY DAY   spironolactone  25 MG tablet Commonly known as: ALDACTONE  Take 1 tablet (25 mg total) by mouth daily.   tamsulosin  0.4 MG Caps capsule Commonly known as: FLOMAX  Take 0.4 mg by mouth.         Outstanding Labs/Studies   none  ______________________  Duration of Discharge Encounter: APP Time: 20 minutes    Signed, Lamarr Hummer, PA-C 07/06/2024, 12:05 PM [phone]  Day Deery was seen by me today along with Lamarr Hummer, PA-C I have personally  performed an evaluation on this patient.  My findings are as follows: 73 y.o. male with severe aortic stenosis who is now one day post TAVR. He did well overnight. No issues. No complaints today.  BP is stable. Atrial fib on tele with new LBBB-personally reviewed by me  Data: EKG(s) and pertinent labs, studies, etc were personally reviewed and interpreted by me: Atrial fib, LBBB Labs reviewed by me Otherwise, I agree with data as outlined by the advanced practice provider.  Exam performed by me: Gen: NAD Neck: No JVD Cardiac: Irreg irreg Lungs: Clear bilaterally Extremities: No LE edema. Groins without hematoma.   My Assessment and Plan:  Severe aortic stenosis: Doing well post TAVR. Continue Eliquis  (PAF and LV thrombus) and Plavix  (recent coronary stent). One week office follow up. Echo in one month CAD without angina: recent coronary stent. Continue Plavix  Discharge home today  I have spent 25 minutes today on chart review, EKG review, tele review, lab review, examination of patient, discussion of findings with pt, plan formulation and note construction.    Signed,  [name], MD  07/06/2024 1:54 PM

## 2024-07-05 NOTE — Progress Notes (Signed)
 Dr. Verlin is aware of the patient's urinalysis result from 07/01/24.Ok per Dr. Verlin. No new orders received.

## 2024-07-05 NOTE — Anesthesia Preprocedure Evaluation (Signed)
 Anesthesia Evaluation  Patient identified by MRN, date of birth, ID band Patient awake    Reviewed: Allergy & Precautions, NPO status , Patient's Chart, lab work & pertinent test results  History of Anesthesia Complications Negative for: history of anesthetic complications  Airway Mallampati: III  TM Distance: <3 FB Neck ROM: Full    Dental  (+) Poor Dentition, Missing, Chipped,    Pulmonary neg shortness of breath, neg sleep apnea, neg COPD, neg recent URI, former smoker   breath sounds clear to auscultation       Cardiovascular hypertension, + CAD and + Past MI  (-) dysrhythmias + Valvular Problems/Murmurs AS  Rhythm:Regular + Systolic murmurs    Neuro/Psych negative neurological ROS     GI/Hepatic Neg liver ROS,GERD  ,,  Endo/Other  diabetes    Renal/GU CRFRenal disease     Musculoskeletal   Abdominal   Peds  Hematology  (+) Blood dyscrasia, anemia Lab Results      Component                Value               Date                      WBC                      10.4                07/01/2024                HGB                      11.4 (L)            07/01/2024                HCT                      39.0                07/01/2024                MCV                      81.3                07/01/2024                PLT                      295                 07/01/2024              Anesthesia Other Findings   Reproductive/Obstetrics                              Anesthesia Physical Anesthesia Plan  ASA: 4  Anesthesia Plan: MAC   Post-op Pain Management: Minimal or no pain anticipated   Induction: Intravenous  PONV Risk Score and Plan: 1 and Propofol  infusion and Treatment may vary due to age or medical condition  Airway Management Planned: Nasal Cannula, Natural Airway and Simple Face Mask  Additional Equipment:   Intra-op Plan:   Post-operative Plan:   Informed  Consent: I have reviewed the patients  History and Physical, chart, labs and discussed the procedure including the risks, benefits and alternatives for the proposed anesthesia with the patient or authorized representative who has indicated his/her understanding and acceptance.     Dental advisory given  Plan Discussed with: CRNA  Anesthesia Plan Comments:         Anesthesia Quick Evaluation

## 2024-07-05 NOTE — CV Procedure (Signed)
 [facility]  TAVR OPERATIVE NOTE   Date of Procedure:  07/05/2024  Preoperative Diagnosis: Severe Aortic Stenosis   Postoperative Diagnosis: Same   Procedure:   Transcatheter Aortic Valve Replacement - Transfemoral Approach  Edwards Sapien 3 Ultra Resilia THV (size 29 mm, model # [id number], serial # [id number] )   Co-Surgeons:  [name], MD and Linnie Rayas , MD   Anesthesiologist:  Leopoldo  Echocardiographer:  Burton  Pre-operative Echo Findings: Severe aortic stenosis Moderate left ventricular systolic dysfunction  Post-operative Echo Findings: No paravalvular leak Moderate left ventricular systolic dysfunction  BRIEF CLINICAL NOTE AND INDICATIONS FOR SURGERY  73 yo male with CAD s/p STEMI in May 2025, etoh abuse, DM, HTN, HFrEF and severe aortic stenosis.   During the course of the patient's preoperative work up they have been evaluated comprehensively by a multidisciplinary team of specialists coordinated through the Multidisciplinary Heart Valve Clinic in the [facility].  They have been demonstrated to suffer from symptomatic severe aortic stenosis as noted above. The patient has been counseled extensively as to the relative risks and benefits of all options for the treatment of severe aortic stenosis including long term medical therapy, conventional surgery for aortic valve replacement, and transcatheter aortic valve replacement.  The patient has been independently evaluated by Dr. Rayas with CT surgery and they are felt to be at high risk for conventional surgical aortic valve replacement. The surgeon indicated the patient would be a poor candidate for conventional surgery. Based upon review of all of the patient's preoperative diagnostic tests they are felt to be candidate for transcatheter aortic valve replacement using the transfemoral approach as an alternative to high risk conventional surgery.    Following the  decision to proceed with transcatheter aortic valve replacement, a discussion has been held regarding what types of management strategies would be attempted intraoperatively in the event of life-threatening complications, including whether or not the patient would be considered a candidate for the use of cardiopulmonary bypass and/or conversion to open sternotomy for attempted surgical intervention.  The patient has been advised of a variety of complications that might develop peculiar to this approach including but not limited to risks of death, stroke, paravalvular leak, aortic dissection or other major vascular complications, aortic annulus rupture, device embolization, cardiac rupture or perforation, acute myocardial infarction, arrhythmia, heart block or bradycardia requiring permanent pacemaker placement, congestive heart failure, respiratory failure, renal failure, pneumonia, infection, other late complications related to structural valve deterioration or migration, or other complications that might ultimately cause a temporary or permanent loss of functional independence or other long term morbidity.  The patient provides full informed consent for the procedure as described and all questions were answered preoperatively.   DETAILS OF THE OPERATIVE PROCEDURE  PREPARATION:   The patient is brought to the operating room on the above mentioned date and central monitoring was established by the anesthesia team including placement of a radial arterial line. The patient is placed in the supine position on the operating table.  Intravenous antibiotics are administered. Conscious sedation is used.   Baseline transthoracic echocardiogram was performed. The patient's chest, abdomen, both groins, and both lower extremities are prepared and draped in a sterile manner. A time out procedure is performed.  PERIPHERAL ACCESS:   Using the modified Seldinger technique, femoral arterial and venous access were  obtained with placement of a 6 Fr sheath in the artery and a 7 Fr sheath in the vein on the left  side using u/s guidance.  A pigtail diagnostic catheter was passed through the femoral arterial sheath under fluoroscopic guidance into the aortic root.  A temporary transvenous pacemaker catheter was passed through the femoral venous sheath under fluoroscopic guidance into the right ventricle.  The pacemaker was tested to ensure stable lead placement and pacemaker capture. Aortic root angiography was performed in order to determine the optimal angiographic angle for valve deployment.  TRANSFEMORAL ACCESS:  A micropuncture kit was used to gain access to the right femoral artery using u/s guidance. Position confirmed with angiography. Pre-closure with double ProGlide closure devices. The patient was heparinized systemically and ACT verified > 250 seconds.    A 16 Fr transfemoral E-sheath was introduced into the right femoral artery after progressively dilating over an Amplatz superstiff wire. An AL-2 catheter was used to direct a straight-tip exchange length wire across the native aortic valve into the left ventricle. This was exchanged out for a pigtail catheter and position was confirmed in the LV apex. Simultaneous LV and Ao pressures were recorded.  The pigtail catheter was then exchanged for a Safari wire in the LV apex.   TRANSCATHETER HEART VALVE DEPLOYMENT:  An Edwards Sapien 3 THV (size 29 mm) was prepared and crimped per manufacturer's guidelines, and the proper orientation of the valve is confirmed on the [employer] delivery system. The valve was advanced through the introducer sheath using normal technique until in an appropriate position in the abdominal aorta beyond the sheath tip. The balloon was then retracted and using the fine-tuning wheel was centered on the valve. The valve was then advanced across the aortic arch using appropriate flexion of the catheter. The valve was carefully  positioned across the aortic valve annulus. The Commander catheter was retracted using normal technique. Once final position of the valve has been confirmed by angiographic assessment, the valve is deployed while temporarily holding ventilation and during rapid ventricular pacing to maintain systolic blood pressure < 50 mmHg and pulse pressure < 10 mmHg. The balloon inflation is held for >3 seconds after reaching full deployment volume. Once the balloon has fully deflated the balloon is retracted into the ascending aorta and valve function is assessed using TTE. There is felt to be no paravalvular leak and no central aortic insufficiency.  The patient's hemodynamic recovery following valve deployment is good.  The deployment balloon and guidewire are both removed. Echo demostrated acceptable post-procedural gradients, stable mitral valve function, and no AI.   PROCEDURE COMPLETION:  The sheath was then removed and closure devices were completed. Protamine was administered once femoral arterial repair was complete. The temporary pacemaker, pigtail catheters and femoral sheaths were removed with a Mynx closure device placed in the artery and manual pressure used for venous hemostasis.    The patient tolerated the procedure well and is transported to the surgical intensive care in stable condition. There were no immediate intraoperative complications. All sponge instrument and needle counts are verified correct at completion of the operation.   No blood products were administered during the operation.  The patient received a total of 40 mL of intravenous contrast during the procedure.  LVEDP: 14 mmHg  [name] MD, FACC 07/05/2024 1:35 PM

## 2024-07-05 NOTE — Transfer of Care (Signed)
 Immediate Anesthesia Transfer of Care Note  Patient: Mark Meadows  Procedure(s) Performed: Transcatheter Aortic Valve Replacement, Transfemoral ECHOCARDIOGRAM, TRANSTHORACIC  Patient Location: Cath Lab  Anesthesia Type:MAC  Level of Consciousness: awake, alert , and oriented  Airway & Oxygen Therapy: Patient Spontanous Breathing  Post-op Assessment: Report given to RN and Post -op Vital signs reviewed and stable  Post vital signs: Reviewed and stable  Last Vitals:  Vitals Value Taken Time  BP 89/62 07/05/24 13:55  Temp 98   Pulse 47 07/05/24 13:58  Resp 20 07/05/24 13:58  SpO2 93 % 07/05/24 13:58  Vitals shown include unfiled device data.  Last Pain:  Vitals:   07/05/24 1223  PainSc: Asleep      Patients Stated Pain Goal: 0 (07/05/24 9078)  Complications: There were no known notable events for this encounter.

## 2024-07-05 NOTE — Interval H&P Note (Signed)
 History and Physical Interval Note:  07/05/2024 12:01 PM  Mark Meadows  has presented today for surgery, with the diagnosis of Severe Aortic Stenosis.  The various methods of treatment have been discussed with the patient and family. After consideration of risks, benefits and other options for treatment, the patient has consented to  Procedure(s): Transcatheter Aortic Valve Replacement, Transfemoral (N/A) ECHOCARDIOGRAM, TRANSTHORACIC (N/A) as a surgical intervention.  The patient's history has been reviewed, patient examined, no change in status, stable for surgery.  I have reviewed the patient's chart and labs.  Questions were answered to the patient's satisfaction.     Farhana Fellows MALVA Rayas

## 2024-07-05 NOTE — Plan of Care (Signed)

## 2024-07-05 NOTE — Discharge Instructions (Signed)

## 2024-07-05 NOTE — Progress Notes (Signed)
 Patient brought to 4E from cath lab. Telemetry box applied, CCMD notified. Patient oriented to room and staff. Call bell in reach.   07/05/24 1650  Vitals  Temp 98.3 F (36.8 C)  Temp Source Oral  BP 113/60  MAP (mmHg) 77  BP Location Right Arm  BP Method Automatic  Patient Position (if appropriate) Lying  Pulse Rate (!) 49  Pulse Rate Source Monitor  ECG Heart Rate (!) 49  Resp 20  MEWS COLOR  MEWS Score Color Green  Oxygen Therapy  SpO2 97 %  O2 Device Room Air  MEWS Score  MEWS Temp 0  MEWS Systolic 0  MEWS Pulse 1  MEWS RR 0  MEWS LOC 0  MEWS Score 1

## 2024-07-05 NOTE — Interval H&P Note (Signed)
 History and Physical Interval Note:  07/05/2024 12:01 PM  Mark Meadows  has presented today for surgery, with the diagnosis of Severe Aortic Stenosis.  The various methods of treatment have been discussed with the patient and family. After consideration of risks, benefits and other options for treatment, the patient has consented to  Procedure(s): Transcatheter Aortic Valve Replacement, Transfemoral (N/A) ECHOCARDIOGRAM, TRANSTHORACIC (N/A) as a surgical intervention.  The patient's history has been reviewed, patient examined, no change in status, stable for surgery.  I have reviewed the patient's chart and labs.  Questions were answered to the patient's satisfaction.     [name]

## 2024-07-05 NOTE — Progress Notes (Addendum)
  [facility]   MULTIDISCIPLINARY HEART VALVE TEAM  Patient doing well s/p TAVR. He is hemodynamically stable but required levophed. Will treat with 500 cc NS. Groin sites stable. ECG with chronic afib with slow VR and LBBB but no high grade block. Plan to transfer to from cath lab holding to 4E when bed available. Early ambulation after bedrest completed and hopeful discharge over the next 24-48 hours.   Lamarr Hummer PA-C  [facility]  Pager [phone]

## 2024-07-05 NOTE — Progress Notes (Signed)
  Echocardiogram 2D Echocardiogram has been performed.  Mark Meadows 07/05/2024, 1:22 PM

## 2024-07-05 NOTE — Op Note (Signed)
 Signed       [facility]   MULTIDISCIPLINARY HEART VALVE TEAM     [facility]  TAVR OPERATIVE NOTE     Date of Procedure:                07/05/2024   Preoperative Diagnosis:      Severe Aortic Stenosis    Postoperative Diagnosis:    Same    Procedure:        Transcatheter Aortic Valve Replacement - Transfemoral Approach             Edwards Sapien 3 Ultra Resilia THV (size 29 mm, model # [id number], serial # [id number] )              Co-Surgeons:                        [name], MD and Linnie Rayas , MD    Anesthesiologist:                  Leopoldo   Echocardiographer:              Burton   Pre-operative Echo Findings: Severe aortic stenosis Moderate left ventricular systolic dysfunction   Post-operative Echo Findings: No paravalvular leak Moderate left ventricular systolic dysfunction   BRIEF CLINICAL NOTE AND INDICATIONS FOR SURGERY   73 yo male with CAD s/p STEMI in May 2025, etoh abuse, DM, HTN, HFrEF and severe aortic stenosis.    During the course of the patient's preoperative work up they have been evaluated comprehensively by a multidisciplinary team of specialists coordinated through the Multidisciplinary Heart Valve Clinic in the [facility].  They have been demonstrated to suffer from symptomatic severe aortic stenosis as noted above. The patient has been counseled extensively as to the relative risks and benefits of all options for the treatment of severe aortic stenosis including long term medical therapy, conventional surgery for aortic valve replacement, and transcatheter aortic valve replacement.  The patient has been independently evaluated by Dr. Rayas with CT surgery and they are felt to be at high risk for conventional surgical aortic valve replacement. The surgeon indicated the patient would be a poor candidate for conventional surgery. Based upon review of all of the patient's  preoperative diagnostic tests they are felt to be candidate for transcatheter aortic valve replacement using the transfemoral approach as an alternative to high risk conventional surgery.     Following the decision to proceed with transcatheter aortic valve replacement, a discussion has been held regarding what types of management strategies would be attempted intraoperatively in the event of life-threatening complications, including whether or not the patient would be considered a candidate for the use of cardiopulmonary bypass and/or conversion to open sternotomy for attempted surgical intervention.  The patient has been advised of a variety of complications that might develop peculiar to this approach including but not limited to risks of death, stroke, paravalvular leak, aortic dissection or other major vascular complications, aortic annulus rupture, device embolization, cardiac rupture or perforation, acute myocardial infarction, arrhythmia, heart block or bradycardia requiring permanent pacemaker placement, congestive heart failure, respiratory failure, renal failure, pneumonia, infection, other late complications related to structural valve deterioration or migration, or other complications that might ultimately cause a temporary or permanent loss of functional independence or other long term morbidity.  The patient provides full informed consent for the procedure as described and all questions were  answered preoperatively.     DETAILS OF THE OPERATIVE PROCEDURE   PREPARATION:   The patient is brought to the operating room on the above mentioned date and central monitoring was established by the anesthesia team including placement of a radial arterial line. The patient is placed in the supine position on the operating table.  Intravenous antibiotics are administered. Conscious sedation is used.    Baseline transthoracic echocardiogram was performed. The patient's chest, abdomen, both groins, and  both lower extremities are prepared and draped in a sterile manner. A time out procedure is performed.   PERIPHERAL ACCESS:   Using the modified Seldinger technique, femoral arterial and venous access were obtained with placement of a 6 Fr sheath in the artery and a 7 Fr sheath in the vein on the left side using u/s guidance.  A pigtail diagnostic catheter was passed through the femoral arterial sheath under fluoroscopic guidance into the aortic root.  A temporary transvenous pacemaker catheter was passed through the femoral venous sheath under fluoroscopic guidance into the right ventricle.  The pacemaker was tested to ensure stable lead placement and pacemaker capture. Aortic root angiography was performed in order to determine the optimal angiographic angle for valve deployment.   TRANSFEMORAL ACCESS:  A micropuncture kit was used to gain access to the right femoral artery using u/s guidance. Position confirmed with angiography. Pre-closure with double ProGlide closure devices. The patient was heparinized systemically and ACT verified > 250 seconds.     A 16 Fr transfemoral E-sheath was introduced into the right femoral artery after progressively dilating over an Amplatz superstiff wire. An AL-2 catheter was used to direct a straight-tip exchange length wire across the native aortic valve into the left ventricle. This was exchanged out for a pigtail catheter and position was confirmed in the LV apex. Simultaneous LV and Ao pressures were recorded.  The pigtail catheter was then exchanged for a Safari wire in the LV apex.    TRANSCATHETER HEART VALVE DEPLOYMENT:  An Edwards Sapien 3 THV (size 29 mm) was prepared and crimped per manufacturer's guidelines, and the proper orientation of the valve is confirmed on the [employer] delivery system. The valve was advanced through the introducer sheath using normal technique until in an appropriate position in the abdominal aorta beyond the sheath tip. The  balloon was then retracted and using the fine-tuning wheel was centered on the valve. The valve was then advanced across the aortic arch using appropriate flexion of the catheter. The valve was carefully positioned across the aortic valve annulus. The Commander catheter was retracted using normal technique. Once final position of the valve has been confirmed by angiographic assessment, the valve is deployed while temporarily holding ventilation and during rapid ventricular pacing to maintain systolic blood pressure < 50 mmHg and pulse pressure < 10 mmHg. The balloon inflation is held for >3 seconds after reaching full deployment volume. Once the balloon has fully deflated the balloon is retracted into the ascending aorta and valve function is assessed using TTE. There is felt to be no paravalvular leak and no central aortic insufficiency.  The patient's hemodynamic recovery following valve deployment is good.  The deployment balloon and guidewire are both removed. Echo demostrated acceptable post-procedural gradients, stable mitral valve function, and no AI.    PROCEDURE COMPLETION:  The sheath was then removed and closure devices were completed. Protamine was administered once femoral arterial repair was complete. The temporary pacemaker, pigtail catheters and femoral sheaths were removed with a  Mynx closure device placed in the artery and manual pressure used for venous hemostasis.     The patient tolerated the procedure well and is transported to the surgical intensive care in stable condition. There were no immediate intraoperative complications. All sponge instrument and needle counts are verified correct at completion of the operation.    No blood products were administered during the operation.   The patient received a total of 40 mL of intravenous contrast during the procedure.   LVEDP: 14 mmHg  Linnie MALVA Rayas, MD

## 2024-07-06 ENCOUNTER — Encounter (HOSPITAL_COMMUNITY): Payer: Self-pay | Admitting: Cardiovascular Disease

## 2024-07-06 ENCOUNTER — Inpatient Hospital Stay (HOSPITAL_COMMUNITY)
Admission: RE | Admit: 2024-07-06 | Discharge: 2024-07-06 | Disposition: A | Source: Home / Self Care | Attending: Physician Assistant | Admitting: Cardiovascular Disease

## 2024-07-06 ENCOUNTER — Inpatient Hospital Stay (HOSPITAL_COMMUNITY)

## 2024-07-06 DIAGNOSIS — I350 Nonrheumatic aortic (valve) stenosis: Secondary | ICD-10-CM

## 2024-07-06 DIAGNOSIS — I447 Left bundle-branch block, unspecified: Principal | ICD-10-CM | POA: Insufficient documentation

## 2024-07-06 DIAGNOSIS — N184 Chronic kidney disease, stage 4 (severe): Secondary | ICD-10-CM | POA: Diagnosis not present

## 2024-07-06 DIAGNOSIS — E039 Hypothyroidism, unspecified: Secondary | ICD-10-CM | POA: Diagnosis not present

## 2024-07-06 DIAGNOSIS — I5022 Chronic systolic (congestive) heart failure: Secondary | ICD-10-CM | POA: Diagnosis not present

## 2024-07-06 DIAGNOSIS — Z952 Presence of prosthetic heart valve: Secondary | ICD-10-CM | POA: Diagnosis not present

## 2024-07-06 LAB — CBC
HCT: 34.1 % — ABNORMAL LOW (ref 39.0–52.0)
Hemoglobin: 10 g/dL — ABNORMAL LOW (ref 13.0–17.0)
MCH: 23.2 pg — ABNORMAL LOW (ref 26.0–34.0)
MCHC: 29.3 g/dL — ABNORMAL LOW (ref 30.0–36.0)
MCV: 79.1 fL — ABNORMAL LOW (ref 80.0–100.0)
Platelets: 208 K/uL (ref 150–400)
RBC: 4.31 MIL/uL (ref 4.22–5.81)
RDW: 17.5 % — ABNORMAL HIGH (ref 11.5–15.5)
WBC: 13.1 K/uL — ABNORMAL HIGH (ref 4.0–10.5)
nRBC: 0 % (ref 0.0–0.2)

## 2024-07-06 LAB — ECHOCARDIOGRAM COMPLETE
AR max vel: 2.9 cm2
AV Area VTI: 3.01 cm2
AV Area mean vel: 2.67 cm2
AV Mean grad: 7 mmHg
AV Peak grad: 12.3 mmHg
Ao pk vel: 1.76 m/s
Area-P 1/2: 5.54 cm2
Calc EF: 39.8 %
Height: 69 in
S' Lateral: 4.4 cm
Single Plane A2C EF: 49.1 %
Single Plane A4C EF: 31.1 %
Weight: 3204.8 [oz_av]

## 2024-07-06 LAB — BASIC METABOLIC PANEL WITH GFR
Anion gap: 11 (ref 5–15)
BUN: 18 mg/dL (ref 8–23)
CO2: 21 mmol/L — ABNORMAL LOW (ref 22–32)
Calcium: 9 mg/dL (ref 8.9–10.3)
Chloride: 101 mmol/L (ref 98–111)
Creatinine, Ser: 1.69 mg/dL — ABNORMAL HIGH (ref 0.61–1.24)
GFR, Estimated: 42 mL/min — ABNORMAL LOW (ref >60)
Glucose, Bld: 185 mg/dL — ABNORMAL HIGH (ref 70–99)
Potassium: 4.4 mmol/L (ref 3.5–5.1)
Sodium: 133 mmol/L — ABNORMAL LOW (ref 135–145)

## 2024-07-06 LAB — GLUCOSE, CAPILLARY
Glucose-Capillary: 204 mg/dL — ABNORMAL HIGH (ref 70–99)
Glucose-Capillary: 168 mg/dL — ABNORMAL HIGH (ref 70–99)

## 2024-07-06 LAB — MAGNESIUM: Magnesium: 1.7 mg/dL (ref 1.7–2.4)

## 2024-07-06 MED ORDER — COLCHICINE 0.6 MG PO TABS
1.2000 mg | ORAL_TABLET | Freq: Once | ORAL | Status: AC
  Administered 2024-07-06: 1.2 mg via ORAL
  Filled 2024-07-06: qty 2

## 2024-07-06 MED ORDER — PERFLUTREN LIPID MICROSPHERE
1.0000 mL | INTRAVENOUS | Status: AC | PRN
  Administered 2024-07-06: 2 mL via INTRAVENOUS

## 2024-07-06 MED ORDER — COLCHICINE 0.6 MG PO TABS
0.6000 mg | ORAL_TABLET | Freq: Once | ORAL | Status: AC
  Administered 2024-07-06: 0.6 mg via ORAL
  Filled 2024-07-06: qty 1

## 2024-07-06 NOTE — Progress Notes (Signed)
  Echocardiogram 2D Echocardiogram has been performed.  Mark Meadows 07/06/2024, 11:13 AM

## 2024-07-06 NOTE — Progress Notes (Signed)
 Discussed with pt restrictions and mobility as tolerated. He is not interested in CRPII due to travel expenses and copays. His mobility is limited due to gout.  [id number] Mark Meadows BS, ACSM-CEP 07/06/2024 11:54 AM

## 2024-07-06 NOTE — Progress Notes (Signed)
   07/06/24 1229  TOC Brief Assessment  Insurance and Status Reviewed  Patient has primary care physician Yes  Home environment has been reviewed home  Prior level of function: self/independent  Prior/Current Home Services No current home services  Social Drivers of Health Review SDOH reviewed no interventions necessary  Readmission risk has been reviewed Yes  Transition of care needs no transition of care needs at this time    Pt s/p TAVR, stable for transition home today, no HH or DME needs noted.

## 2024-07-06 NOTE — Plan of Care (Signed)

## 2024-07-06 NOTE — Plan of Care (Signed)

## 2024-07-06 NOTE — Progress Notes (Signed)
 EOS  Pt OOB, unsteady gait  Pt would like to discuss pain management for his knees when he d/c home

## 2024-07-06 NOTE — Progress Notes (Signed)
 Explained discharge instructions to patient. Reviewed follow up appointment and next medication administration times. Also reviewed education. Patient verbalized having an understanding for instructions given. All belongings are in the patient's possession. IVs and telemetry were removed. CCMD was notified. No other needs verbalized. Transported downstairs for discharge to the lounge to await taxi.

## 2024-07-07 ENCOUNTER — Telehealth: Payer: Self-pay

## 2024-07-07 NOTE — Anesthesia Postprocedure Evaluation (Signed)
 Anesthesia Post Note  Patient: Mark Meadows  Procedure(s) Performed: Transcatheter Aortic Valve Replacement, Transfemoral ECHOCARDIOGRAM, TRANSTHORACIC     Patient location during evaluation: Cath Lab Anesthesia Type: MAC Level of consciousness: awake and alert Pain management: pain level controlled Vital Signs Assessment: post-procedure vital signs reviewed and stable Respiratory status: spontaneous breathing, nonlabored ventilation and respiratory function stable Cardiovascular status: stable and blood pressure returned to baseline Postop Assessment: no apparent nausea or vomiting Anesthetic complications: no   There were no known notable events for this encounter.                 Blessing Zaucha

## 2024-07-07 NOTE — Telephone Encounter (Signed)
 Attempted to call patient twice today for TAVR TOC call and LMTRC.

## 2024-07-08 ENCOUNTER — Telehealth: Payer: Self-pay | Admitting: Cardiovascular Disease

## 2024-07-08 NOTE — Telephone Encounter (Signed)
 Please see below.

## 2024-07-08 NOTE — Telephone Encounter (Signed)
   Cardiac Monitor Alert  Date of alert:  07/08/2024   Patient Name: Mark Meadows  DOB: 11-30-1950  MRN: [id number]   [facility] Cardiologist: Ezra Shuck, MD  [facility] EP:  None    Monitor Information: Long Term Monitor-Live Telemetry [ZioAT]  Reason:  New LBBB s/p TAVR (10/15) Ordering provider:  Lamarr Hummer, PA-C   Alert Atrial Fibrillation/Flutter, slow a-fib at 33bpm at 3:15am, 3:31am, 5:17am, 5:55am, 7:01am, 9:32am, 9:58am, 10:55am, 11:06am This is the 1st alert for this rhythm. Multiple alerts received.  The patient has a hx of Atrial Fibrillation/Flutter.  The patient is currently on anticoagulation.   Anticoagulation medication as of 07/08/2024           apixaban  (ELIQUIS ) 5 MG TABS tablet Take 1 tablet (5 mg total) by mouth 2 (two) times daily.       Next Cardiology Appointment   Date:  10/20 @ 10:20am   Provider:  Lamarr Hummer, PA-C   Izetta Hummer, PA-C was alerted to slow a-fib earlier today. Plans to change pt's medications ordered and pt has been made aware.   Other: Will transfer strips into media tab.  Melford Tullier L, RN  07/08/2024 3:32 PM

## 2024-07-08 NOTE — Telephone Encounter (Signed)
 Patient contacted on 07/08/24 regarding discharge from [facility] on 07/06/24.  Patient understands to follow up with provider Izetta Hummer, PA-C on 07/11/24 at 10:20AM at [address] Location. Patient understands discharge instructions? Yes Patient understands medications and regiment? Yes Patient understands to bring all medications to this visit? Yes

## 2024-07-08 NOTE — Progress Notes (Unsigned)
 HEART AND VASCULAR CENTER   MULTIDISCIPLINARY HEART VALVE TEAM  Virtual Visit via Telephone Note   Because of Mark Meadows's co-morbid illnesses, he is at least at moderate risk for complications without adequate follow up.  This format is felt to be most appropriate for this patient at this time.  The patient did not have access to video technology/had technical difficulties with video requiring transitioning to audio format only (telephone).  All issues noted in this document were discussed and addressed.  No physical exam could be performed with this format.  Please refer to the patient's chart for his consent to telehealth for [facility].   Evaluation Performed:  Follow-up visit  Date:  07/11/2024   ID:  Mark Meadows, DOB Feb 13, 1951, MRN [id number]  Patient Location: Home Provider Location: Office/Clinic  PCP:  Haze Kingfisher, MD  Cardiologist:  Ezra Shuck, MD  Electrophysiologist:  None   Chief Complaint:  TOC s/p TAVR  History of Present Illness:    Mark Meadows is a 73 y.o. 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   He was admitted in 01/2024 with a late presenting anterolateral STEMI complicated by cardiogenic shock and LV thrombus. Cath 02/15/24 with a late presenting anterolateral STEMI with 100% p to m LAD treated with PTCA/DES. 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 He was found to have severe aortic stenosis during his hospitalization with STEMI, but TAVR was initially deferred because of the presence of LV thrombus. Echo showed EF 15-20%, LV thrombus, normal RV and low flow low gradient severe  AS; AVA 0.81 cm2 an, MG 16 mm hg, DI 0.24, SVI 17. Follow-up MRI April 27, 2024 showed mild LV dilatation with moderate LV systolic dysfunction, LVEF 37%, and resolution of LV apical thrombus. S/p successful TAVR with a 29 mm Edwards Sapien 3 Ultra Resilia THV via the TF approach on 07/05/24. Post operative echo showed EF 25-30% with akinesis of the distal septum and apex, mod pulm HTN, mild MR, normally functioning TAVR with a mean gradient of 7 mmHg and no PVL. Treated with colchicine  while admitted. Developed a new LBBB s/p TAVR and discharged with a Zio AT.  We have received almost continuous transmissions from the Zio AT for afib with slow VR with HRs in 30-40s. Digoxin  0.15mcg was discontinued on 07/08/24. He was continued on amiodarone .   Today the patient presents via virtual medicine for follow up. He has not been feeling well with fatigue and SOB. Could barely walk to the mailbox today. No dizziness or syncope but does feel weak. Feels like he could fall asleep at any moment.     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 Verlin and Dr. Shyrl   Severe aortic stenosis    Thrombocytopenia    Past Surgical History:  Procedure Laterality Date   COLONOSCOPY N/A 08/26/2016   Procedure: COLONOSCOPY;  Surgeon: Gwendlyn ONEIDA Buddy, MD;  Location: [facility] ENDOSCOPY;  Service: Endoscopy;  Laterality: N/A;   CORONARY/GRAFT ACUTE MI REVASCULARIZATION N/A 02/15/2024   Procedure: Coronary/Graft Acute MI  Revascularization;  Surgeon: Wonda Sharper, MD;  Location: [facility] INVASIVE CV LAB;  Service: Cardiovascular;  Laterality: N/A;   INTRAOPERATIVE TRANSTHORACIC ECHOCARDIOGRAM N/A 07/05/2024   Procedure: ECHOCARDIOGRAM, TRANSTHORACIC;  Surgeon: Verlin Lonni BIRCH, MD;  Location: [facility] INVASIVE CV LAB;  Service: Cardiovascular;  Laterality: N/A;   LEFT HEART CATH AND CORONARY ANGIOGRAPHY N/A 02/15/2024   Procedure: LEFT HEART CATH AND CORONARY ANGIOGRAPHY;  Surgeon: Wonda Sharper, MD;  Location: [facility] INVASIVE CV LAB;  Service: Cardiovascular;  Laterality: N/A;   RIGHT HEART CATH N/A 02/22/2024   Procedure: RIGHT HEART CATH;  Surgeon: Rolan Ezra RAMAN, MD;  Location: [facility] INVASIVE CV LAB;  Service: Cardiovascular;  Laterality: N/A;   TONSILLECTOMY  1957     Current Meds  Medication Sig   allopurinol (ZYLOPRIM) 100 MG tablet Take 200 mg by mouth daily.   amiodarone  (PACERONE ) 200 MG tablet Take 1 tablet (200 mg total) by mouth daily.   apixaban  (ELIQUIS ) 5 MG TABS tablet Take 1 tablet (5 mg total) by mouth 2 (two) times daily.   clopidogrel  (PLAVIX ) 75 MG tablet Take 1 tablet (75 mg total) by mouth daily with breakfast.   dapagliflozin  propanediol (FARXIGA ) 10 MG TABS tablet Take 1 tablet (10 mg total) by mouth daily.   digoxin  (LANOXIN ) 0.125 MG tablet Take 1 tablet (0.125 mg total) by mouth daily. (Patient taking differently: Take 0.125 mg by mouth daily. Pt stopped taking at our request for now)   furosemide  (LASIX ) 40 MG tablet Take 1 tablet by mouth as needed for weight gain, lower extremity edema, increased shortness of breath   levothyroxine  (SYNTHROID ) 150 MCG tablet Take 150-300 mcg by mouth See admin instructions. Take 150mcg (1 tablet) by mouth 5 days a week, then take 300mcg (2 tablets) 2 days a week.   pantoprazole  (PROTONIX ) 40 MG tablet Take 1 tablet (40 mg total) by mouth daily.   rosuvastatin  (CRESTOR ) 40 MG tablet TAKE 1 TABLET BY MOUTH EVERY DAY   spironolactone  (ALDACTONE ) 25 MG tablet  Take 1 tablet (25 mg total) by mouth daily.   tamsulosin  (FLOMAX ) 0.4 MG CAPS capsule Take 0.4 mg by mouth.     Allergies:   Patient has no known allergies.   Social History   Tobacco Use   Smoking status: Former    Current packs/day: 0.00    Average packs/day: 3.0 packs/day for 12.0 years (36.0 ttl pk-yrs)    Types: Cigarettes    Start date: 9    Quit date: 1980    Years since quitting: 45.8   Smokeless tobacco: Never  Vaping Use   Vaping status: Never Used  Substance Use Topics   Alcohol use: Not Currently    Alcohol/week: 80.0 standard drinks of alcohol    Types: 80 Shots of liquor per week    Comment: Quit 4 years ago per patient   Drug use: Not Currently    Types: Marijuana    Comment: 09/02/2016 tried marijuana 2 times; years ago     Family Hx: The patient's family history includes  Heart disease in his father; Thyroid disease in his mother.  ROS:   Please see the history of present illness.    All other systems reviewed and are negative.  Recent Labs: 02/15/2024: TSH 6.656 03/29/2024: B Natriuretic Peptide 415.3 07/01/2024: ALT 13 07/06/2024: BUN 18; Creatinine, Ser 1.69; Hemoglobin 10.0; Magnesium  1.7; Platelets 208; Potassium 4.4; Sodium 133   Recent Lipid Panel Lab Results  Component Value Date/Time   CHOL 86 02/16/2024 06:04 AM   TRIG 67 02/16/2024 06:04 AM   HDL 18 (L) 02/16/2024 06:04 AM   CHOLHDL 4.8 02/16/2024 06:04 AM   LDLCALC 55 02/16/2024 06:04 AM    Wt Readings from Last 3 Encounters:  07/06/24 200 lb 4.8 oz (90.9 kg)  06/17/24 207 lb (93.9 kg)  05/09/24 210 lb (95.3 kg)     Objective:    Vital Signs:  There were no vitals taken for this visit.    ASSESSMENT & PLAN:    Severe AS s/p TAVR:  -- He is doing okay after his valve replacement but fatigued and weak. -- Continue apixaban  5 mg twice a day + Plavix  75 mg daily for PAF and recent STEMI. -- SBE not discussed today.  -- I will see back in the clinic for 1 month  echo/OV  Persistent afib -- He has been on apixaban  5mg  BID, amio 200mg  daily and digoxin  0.125mcg daily.  -- New LBBB after TAVR and discharged with a live Zio. We have received almost continuous transmissions from the Zio AT for afib with slow VR with HRs in 30-40s.  -- Digoxin  0.164mcg was discontinued on 07/08/24. He was continued on amiodarone .  -- He has continued to feel poorly with fatigue and SOB.  -- I have asked him to proceed to [facility] ED for evaluation, I think he will likely require a CRT-D. I have alerted the EP team.  Heart Failure with reduced fraction -- Ischemic CMP / valvular heart disease. -- EF 25-30%. -- No sx of CHF. -- Continue Lasix  40mg  PRN.  -- Continue spironolactone  12.5mg  daily  -- Continue Farxiga  10mg  daily.   CAD -- STEMI 02/15/24 and cath showed 100% prox to mid LAD lesion s/p DES. -- No chest pain.  -- Continue apixaban  5 mg twice a day +Plavix  75 mg daily. -- Continue Crestor  40 mg daily.    LV Thrombus  -- Resolved on CMR on 04/27/24. -- Continue eliquis  5 mg twice a day.   CKD Stage IIIb -- Creat Baseline ~ 1.5-2 -- Creat 1.69 on the day of discharge.    Time:   Today, I have spent 25 minutes with the patient with telehealth technology discussing the above problems.     Medication Adjustments/Labs and Tests Ordered: Current medicines are reviewed at length with the patient today.  Concerns regarding medicines are outlined above.   Tests Ordered: Orders Placed This Encounter  Procedures   ECHOCARDIOGRAM COMPLETE    Medication Changes: No orders of the defined types were placed in this encounter.    Disposition:  Follow up 1 month  Signed, Lamarr Hummer, PA-C  07/11/2024 11:36 AM    [facility]

## 2024-07-08 NOTE — Telephone Encounter (Signed)
 TAVR team notified of pt being in AFib with rates 33-50. Patient contacted by Concetta Brier CMA and the pt took a nap earlier today  and then went to the dog park for a few hours and was away from his box for transmission.  Per Izetta Hummer PA-C the pt was advised to stop digoxin  at this time.  We will continue to monitor the pt's rhythm at this time.

## 2024-07-08 NOTE — Telephone Encounter (Signed)
Abnormal EKG results. Please advise

## 2024-07-09 ENCOUNTER — Telehealth: Payer: Self-pay | Admitting: Physician Assistant

## 2024-07-09 NOTE — Telephone Encounter (Signed)
   Cardiac Monitor Alert  Date of alert:  07/09/2024   Patient Name: Mark Meadows  DOB: 10/07/1950  MRN: [id number]   [facility] Cardiologist: Ezra Shuck, MD  [facility] EP:  None    Monitor Information: Long Term Monitor-Live Telemetry [ZioAT]  Reason:  New LBBB s/p TAVR (10/15) Ordering provider:  Lamarr Hummer, PA-C   Alert Atrial Fibrillation/Flutter w slow VR This is the 4th alert for this rhythm.  The patient has a hx of Atrial Fibrillation/Flutter.  The patient is not currently on anticoagulation.  Anticoagulation medication as of 07/09/2024           apixaban  (ELIQUIS ) 5 MG TABS tablet Take 1 tablet (5 mg total) by mouth 2 (two) times daily.       Other: Changed call parameters to sustained slow VR, HR < 30, pause > 3 sec or high grade HB until Monday.  Glendia Ferrier, PA-C  07/09/2024 4:27 PM

## 2024-07-09 NOTE — Telephone Encounter (Addendum)
   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  [employer] notes there have been 20+ episodes noted The patient has a hx of Atrial Fibrillation/Flutter.  The patient is not currently on anticoagulation.  Anticoagulation medication as of 07/09/2024           apixaban  (ELIQUIS ) 5 MG TABS tablet Take 1 tablet (5 mg total) by mouth 2 (two) times daily.       Next Cardiology Appointment   Date:  07/11/24  Provider:  Hummer, [place] (virtual visit)  The patient was contacted today.  He is symptomatic.  He reports the following symptoms:  mild increase in shortness of breath with exertion since procedure. He has not had dizziness, syncope, near syncope.  His last dose of Digoxin  was yesterday. He knows he is to stop it.  Patient advised to go to the ED if shortness of breath worsens or he develops symptoms of fatigue, dizziness, near syncope or syncope.    Glendia Ferrier, PA-C  07/09/2024 10:15 AM

## 2024-07-09 NOTE — Telephone Encounter (Signed)
   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  The patient has a hx of Atrial Fibrillation/Flutter.  The patient is not currently on anticoagulation.  Anticoagulation medication as of 07/09/2024           apixaban  (ELIQUIS ) 5 MG TABS tablet Take 1 tablet (5 mg total) by mouth 2 (two) times daily.       The patient was not contacted again today.   See previous alert.    Glendia Ferrier, PA-C  07/09/2024 2:46 PM

## 2024-07-11 ENCOUNTER — Emergency Department (HOSPITAL_COMMUNITY)

## 2024-07-11 ENCOUNTER — Telehealth: Payer: Self-pay | Admitting: Physician Assistant

## 2024-07-11 ENCOUNTER — Other Ambulatory Visit: Payer: Self-pay

## 2024-07-11 ENCOUNTER — Ambulatory Visit: Attending: Physician Assistant | Admitting: Physician Assistant

## 2024-07-11 ENCOUNTER — Inpatient Hospital Stay (HOSPITAL_COMMUNITY)
Admission: EM | Admit: 2024-07-11 | Discharge: 2024-07-15 | DRG: 277 | Disposition: A | Discharge status: Home / Self Care | Attending: Cardiovascular Disease | Admitting: Cardiovascular Disease

## 2024-07-11 ENCOUNTER — Encounter (HOSPITAL_COMMUNITY): Payer: Self-pay

## 2024-07-11 DIAGNOSIS — I4819 Other persistent atrial fibrillation: Secondary | ICD-10-CM

## 2024-07-11 DIAGNOSIS — F1011 Alcohol abuse, in remission: Secondary | ICD-10-CM | POA: Diagnosis present

## 2024-07-11 DIAGNOSIS — I5020 Unspecified systolic (congestive) heart failure: Secondary | ICD-10-CM

## 2024-07-11 DIAGNOSIS — I4891 Unspecified atrial fibrillation: Secondary | ICD-10-CM

## 2024-07-11 DIAGNOSIS — R001 Bradycardia, unspecified: Principal | ICD-10-CM | POA: Diagnosis present

## 2024-07-11 DIAGNOSIS — Z87891 Personal history of nicotine dependence: Secondary | ICD-10-CM | POA: Diagnosis not present

## 2024-07-11 DIAGNOSIS — I255 Ischemic cardiomyopathy: Secondary | ICD-10-CM | POA: Diagnosis present

## 2024-07-11 DIAGNOSIS — Z23 Encounter for immunization: Secondary | ICD-10-CM | POA: Diagnosis present

## 2024-07-11 DIAGNOSIS — N1832 Chronic kidney disease, stage 3b: Secondary | ICD-10-CM

## 2024-07-11 DIAGNOSIS — N184 Chronic kidney disease, stage 4 (severe): Secondary | ICD-10-CM | POA: Diagnosis present

## 2024-07-11 DIAGNOSIS — I5022 Chronic systolic (congestive) heart failure: Secondary | ICD-10-CM | POA: Diagnosis present

## 2024-07-11 DIAGNOSIS — E1122 Type 2 diabetes mellitus with diabetic chronic kidney disease: Secondary | ICD-10-CM | POA: Diagnosis present

## 2024-07-11 DIAGNOSIS — M109 Gout, unspecified: Secondary | ICD-10-CM | POA: Diagnosis present

## 2024-07-11 DIAGNOSIS — I252 Old myocardial infarction: Secondary | ICD-10-CM

## 2024-07-11 DIAGNOSIS — I2510 Atherosclerotic heart disease of native coronary artery without angina pectoris: Secondary | ICD-10-CM | POA: Diagnosis present

## 2024-07-11 DIAGNOSIS — K219 Gastro-esophageal reflux disease without esophagitis: Secondary | ICD-10-CM | POA: Diagnosis present

## 2024-07-11 DIAGNOSIS — I130 Hypertensive heart and chronic kidney disease with heart failure and stage 1 through stage 4 chronic kidney disease, or unspecified chronic kidney disease: Secondary | ICD-10-CM | POA: Diagnosis present

## 2024-07-11 DIAGNOSIS — I4892 Unspecified atrial flutter: Secondary | ICD-10-CM | POA: Diagnosis present

## 2024-07-11 DIAGNOSIS — Z79899 Other long term (current) drug therapy: Secondary | ICD-10-CM | POA: Diagnosis not present

## 2024-07-11 DIAGNOSIS — Z7902 Long term (current) use of antithrombotics/antiplatelets: Secondary | ICD-10-CM | POA: Diagnosis not present

## 2024-07-11 DIAGNOSIS — Z9089 Acquired absence of other organs: Secondary | ICD-10-CM

## 2024-07-11 DIAGNOSIS — I447 Left bundle-branch block, unspecified: Secondary | ICD-10-CM

## 2024-07-11 DIAGNOSIS — Z7901 Long term (current) use of anticoagulants: Secondary | ICD-10-CM

## 2024-07-11 DIAGNOSIS — E785 Hyperlipidemia, unspecified: Secondary | ICD-10-CM | POA: Diagnosis present

## 2024-07-11 DIAGNOSIS — I429 Cardiomyopathy, unspecified: Secondary | ICD-10-CM | POA: Diagnosis not present

## 2024-07-11 DIAGNOSIS — Z952 Presence of prosthetic heart valve: Secondary | ICD-10-CM

## 2024-07-11 DIAGNOSIS — E039 Hypothyroidism, unspecified: Secondary | ICD-10-CM | POA: Diagnosis present

## 2024-07-11 DIAGNOSIS — Z953 Presence of xenogenic heart valve: Secondary | ICD-10-CM

## 2024-07-11 DIAGNOSIS — Z7984 Long term (current) use of oral hypoglycemic drugs: Secondary | ICD-10-CM

## 2024-07-11 DIAGNOSIS — I509 Heart failure, unspecified: Secondary | ICD-10-CM | POA: Diagnosis not present

## 2024-07-11 DIAGNOSIS — Z79890 Hormone replacement therapy (postmenopausal): Secondary | ICD-10-CM

## 2024-07-11 DIAGNOSIS — I350 Nonrheumatic aortic (valve) stenosis: Secondary | ICD-10-CM | POA: Diagnosis present

## 2024-07-11 DIAGNOSIS — I513 Intracardiac thrombosis, not elsewhere classified: Secondary | ICD-10-CM

## 2024-07-11 DIAGNOSIS — Z8249 Family history of ischemic heart disease and other diseases of the circulatory system: Secondary | ICD-10-CM | POA: Diagnosis not present

## 2024-07-11 LAB — BASIC METABOLIC PANEL WITH GFR
Anion gap: 11 (ref 5–15)
BUN: 27 mg/dL — ABNORMAL HIGH (ref 8–23)
CO2: 19 mmol/L — ABNORMAL LOW (ref 22–32)
Calcium: 8.9 mg/dL (ref 8.9–10.3)
Chloride: 104 mmol/L (ref 98–111)
Creatinine, Ser: 1.91 mg/dL — ABNORMAL HIGH (ref 0.61–1.24)
GFR, Estimated: 36 mL/min — ABNORMAL LOW (ref >60)
Glucose, Bld: 155 mg/dL — ABNORMAL HIGH (ref 70–99)
Potassium: 3.7 mmol/L (ref 3.5–5.1)
Sodium: 134 mmol/L — ABNORMAL LOW (ref 135–145)

## 2024-07-11 LAB — CBC WITH DIFFERENTIAL/PLATELET
Abs Immature Granulocytes: 0.07 K/uL (ref 0.00–0.07)
Basophils Absolute: 0.1 K/uL (ref 0.0–0.1)
Basophils Relative: 1 %
Eosinophils Absolute: 0.2 K/uL (ref 0.0–0.5)
Eosinophils Relative: 2 %
HCT: 35.1 % — ABNORMAL LOW (ref 39.0–52.0)
Hemoglobin: 10.1 g/dL — ABNORMAL LOW (ref 13.0–17.0)
Immature Granulocytes: 1 %
Lymphocytes Relative: 18 %
Lymphs Abs: 1.6 K/uL (ref 0.7–4.0)
MCH: 23.4 pg — ABNORMAL LOW (ref 26.0–34.0)
MCHC: 28.8 g/dL — ABNORMAL LOW (ref 30.0–36.0)
MCV: 81.3 fL (ref 80.0–100.0)
Monocytes Absolute: 1.1 K/uL — ABNORMAL HIGH (ref 0.1–1.0)
Monocytes Relative: 12 %
Neutro Abs: 5.7 K/uL (ref 1.7–7.7)
Neutrophils Relative %: 66 %
Platelets: 191 K/uL (ref 150–400)
RBC: 4.32 MIL/uL (ref 4.22–5.81)
RDW: 18.6 % — ABNORMAL HIGH (ref 11.5–15.5)
WBC: 8.7 K/uL (ref 4.0–10.5)
nRBC: 0 % (ref 0.0–0.2)

## 2024-07-11 LAB — TROPONIN I (HIGH SENSITIVITY): Troponin I (High Sensitivity): 56 ng/L — ABNORMAL HIGH (ref <18)

## 2024-07-11 LAB — TSH: TSH: 14.402 u[IU]/mL — ABNORMAL HIGH (ref 0.350–4.500)

## 2024-07-11 LAB — BRAIN NATRIURETIC PEPTIDE: B Natriuretic Peptide: 292.8 pg/mL — ABNORMAL HIGH (ref 0.0–100.0)

## 2024-07-11 LAB — DIGOXIN LEVEL: Digoxin Level: <0.6 ng/mL — ABNORMAL LOW (ref 0.8–2.0)

## 2024-07-11 MED ORDER — NITROGLYCERIN 0.4 MG SL SUBL: 0.4000 mg | SUBLINGUAL_TABLET | SUBLINGUAL | Status: DC | PRN

## 2024-07-11 MED ORDER — ACETAMINOPHEN 325 MG PO TABS
650.0000 mg | ORAL_TABLET | ORAL | Status: DC | PRN
  Administered 2024-07-12 – 2024-07-14 (×5): 650 mg via ORAL
  Filled 2024-07-11 (×5): qty 2

## 2024-07-11 NOTE — ED Provider Notes (Signed)
 [facility] AT [facility] Provider Note   CSN: [id number] Arrival date & time: 07/11/24  1241     Patient presents with: Bradycardia   Mark Meadows is a 73 y.o. male.  {Add pertinent medical, surgical, social history, OB history to HPI:32947} Pt is a 73 yo male with pmhx significant for htn, hypothyroidism, gerd, paroxysmal afib (on Eliquis ), CAD, DM, and aortic stenosis s/p aortic valve replacement on 10/14.  Pt has been wearing a zio monitor and the company notified cardiology that pt had a slow afib with HR in the 30s today.  Cardiology called pt and told him to come in.  Pt said he feels a little sob, but denies dizziness.  No cp.       Prior to Admission medications   Medication Sig Start Date End Date Taking? Authorizing Provider  allopurinol (ZYLOPRIM) 100 MG tablet Take 200 mg by mouth daily. 03/09/24   [provider]  amiodarone  (PACERONE ) 200 MG tablet Take 1 tablet (200 mg total) by mouth daily. 03/01/24   Colletta Manuelita Garre, PA-C  apixaban  (ELIQUIS ) 5 MG TABS tablet Take 1 tablet (5 mg total) by mouth 2 (two) times daily. 02/29/24   Colletta Manuelita Garre, PA-C  clopidogrel  (PLAVIX ) 75 MG tablet Take 1 tablet (75 mg total) by mouth daily with breakfast. 03/29/24   Sabharwal, Aditya, DO  dapagliflozin  propanediol (FARXIGA ) 10 MG TABS tablet Take 1 tablet (10 mg total) by mouth daily. 04/13/24   Sabharwal, Aditya, DO  digoxin  (LANOXIN ) 0.125 MG tablet Take 1 tablet (0.125 mg total) by mouth daily. Patient taking differently: Take 0.125 mg by mouth daily. Pt stopped taking at our request for now 03/01/24   Colletta Manuelita Garre, PA-C  furosemide  (LASIX ) 40 MG tablet Take 1 tablet by mouth as needed for weight gain, lower extremity edema, increased shortness of breath 02/29/24   Colletta Manuelita Garre, PA-C  levothyroxine  (SYNTHROID ) 150 MCG tablet Take 150-300 mcg by mouth See admin instructions. Take 150mcg (1 tablet) by mouth 5 days a week,  then take 300mcg (2 tablets) 2 days a week.    [provider]  pantoprazole  (PROTONIX ) 40 MG tablet Take 1 tablet (40 mg total) by mouth daily. 03/01/24   Colletta Manuelita Garre, PA-C  rosuvastatin  (CRESTOR ) 40 MG tablet TAKE 1 TABLET BY MOUTH EVERY DAY 06/08/24   Colletta Manuelita Garre, PA-C  spironolactone  (ALDACTONE ) 25 MG tablet Take 1 tablet (25 mg total) by mouth daily. 03/17/24   Lee, [place], NP  tamsulosin  (FLOMAX ) 0.4 MG CAPS capsule Take 0.4 mg by mouth. 03/09/24   [provider]    Allergies: Patient has no known allergies.    Review of Systems  Respiratory:  Positive for shortness of breath.   All other systems reviewed and are negative.   Updated Vital Signs BP 121/75   Pulse (!) 38   Temp 98.2 F (36.8 C)   Resp 18   SpO2 98%   Physical Exam Vitals and nursing note reviewed.  Constitutional:      Appearance: Normal appearance.  HENT:     Head: Normocephalic and atraumatic.     Right Ear: External ear normal.     Left Ear: External ear normal.     Nose: Nose normal.     Mouth/Throat:     Mouth: Mucous membranes are moist.     Pharynx: Oropharynx is clear.  Eyes:     Comments: ambylopia  Cardiovascular:     Rate and  Rhythm: Bradycardia present. Rhythm irregular.     Pulses: Normal pulses.     Heart sounds: Normal heart sounds.  Pulmonary:     Effort: Pulmonary effort is normal.     Breath sounds: Normal breath sounds.  Abdominal:     General: Abdomen is flat. Bowel sounds are normal.     Palpations: Abdomen is soft.  Musculoskeletal:        General: Normal range of motion.  Skin:    General: Skin is warm.     Capillary Refill: Capillary refill takes less than 2 seconds.  Neurological:     General: No focal deficit present.     Mental Status: He is alert and oriented to person, place, and time.  Psychiatric:        Mood and Affect: Mood normal.        Behavior: Behavior normal.     (all labs ordered are listed, but only abnormal  results are displayed) Labs Reviewed  CBC WITH DIFFERENTIAL/PLATELET - Abnormal; Notable for the following components:      Result Value   Hemoglobin 10.1 (*)    HCT 35.1 (*)    MCH 23.4 (*)    MCHC 28.8 (*)    RDW 18.6 (*)    Monocytes Absolute 1.1 (*)    All other components within normal limits  BASIC METABOLIC PANEL WITH GFR  TSH  BRAIN NATRIURETIC PEPTIDE  TROPONIN I (HIGH SENSITIVITY)    EKG: None  Radiology: No results found.  {Document cardiac monitor, telemetry assessment procedure when appropriate:32947} Procedures   Medications Ordered in the ED - No data to display    {Click here for ABCD2, HEART and other calculators REFRESH Note before signing:1}                              Medical Decision Making Amount and/or Complexity of Data Reviewed Labs: ordered. Radiology: ordered.  Risk Decision regarding hospitalization.   This patient presents to the ED for concern of bradycardia, this involves an extensive number of treatment options, and is a complaint that carries with it a high risk of complications and morbidity.  The differential diagnosis includes medication problem, sick sinus, electrolyte abn   Co morbidities that complicate the patient evaluation  htn, hypothyroidism, gerd, paroxysmal afib (on Eliquis ), CAD, DM, and aortic stenosis s/p aortic valve replacement on 10/14   Additional history obtained:  Additional history obtained from epic chart review   Lab Tests:  I Ordered, and personally interpreted labs.  The pertinent results include:  cbc with hgb low at 10.1 (stable), bmp with bun 27 and cr 1.91 (cr 1.69 on 10/15); TSH elevated at 14; trop elevated at 56; bnp elevated at 292.8   Imaging Studies ordered:  I ordered imaging studies including cxr  I independently visualized and interpreted imaging which showed  . Cardiomegaly.  2. Large paraesophageal hernia.  3. No acute appearing airspace opacity.   I agree with the radiologist  interpretation   Cardiac Monitoring:  The patient was maintained on a cardiac monitor.  I personally viewed and interpreted the cardiac monitored which showed an underlying rhythm of: slow afib   Medicines ordered and prescription drug management:  I ordered medication including ***  for ***  Reevaluation of the patient after these medicines showed that the patient {resolved/improved/worsened:23923::improved} I have reviewed the patients home medicines and have made adjustments as needed   Test Considered:  ***  Critical Interventions:  ***   Consultations Obtained:  I requested consultation with the cardiologists,  and discussed lab and imaging findings as well as pertinent plan - they recommend: ***   Problem List / ED Course:  bradycardia   Reevaluation:  After the interventions noted above, I reevaluated the patient and found that they have :stayed the same   Social Determinants of Health:  Lives at home   Dispostion:  After consideration of the diagnostic results and the patients response to treatment, I feel that the patent would benefit from ***.    {Document critical care time when appropriate  Document review of labs and clinical decision tools ie [id number], etc  Document your independent review of radiology images and any outside records  Document your discussion with family members, caretakers and with consultants  Document social determinants of health affecting pt's care  Document your decision making why or why not admission, treatments were needed:32947:::1}   Final diagnoses:  Bradycardia    ED Discharge Orders     None

## 2024-07-11 NOTE — Telephone Encounter (Signed)
 Mark Meadows from [facility] calling with abnormal EKG results.

## 2024-07-11 NOTE — Telephone Encounter (Signed)
 Spoke with Lyndy from [employer] who is reporting pt had 7 episodes of slow At Flutter this AM at 8:01 and 9:14 AM - HR between 34-37 bpm.  In other documentation, pt has already been advised to report to the closest ED for further evaluation and treatment.

## 2024-07-11 NOTE — H&P (Signed)
 Cardiology Admission History and Physical   Patient ID: Mark Meadows MRN: [id number]; DOB: 24-Jul-1951   Admission date: 07/11/2024  PCP:  Haze Kingfisher, MD   [facility] Providers Cardiologist:  Ezra Shuck, MD  Structural Heart:  [name], MD {   Chief Complaint:  symptomatic bradycardia  Patient Profile: Mark Meadows is a 73 y.o. 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  History of Present Illness: Mark Meadows in May 2025 admitted with late presenting STEMI > PCI w/DES to the LAD, LVEF 15-20% range, also developed AFib Noted to have severe AS  Subsequently via c.MRI LV thrombus resolved Subendocardial LGE in mid to apical anterior/anteroseptal walls, apical inferior wall, and apex. LGE is greater than 50% transmural suggesting region is nonviable LV EF: 37% (Normal 49-79%)   14 teeth extracted  Underwent TAVR 07/05/24 post valve deployment developed new LBBB and discharged with Zio monitoring. Alerts for AFib w/SVR 30's and his digoxin  stopped (07/08/24) (1/2 life of 36-48 hours)  Subsequently continued alerts for AFib w/SVR 30's noted during awake hours as well as night/s early morning. Pt reported feeling weak, tired, difficulty getting to the mailbox and back Advised to go to the ER for EP to see, likely need for device  He has been on amiodarone  since June with discussion that once he was post TAVR/recovered > would look to try and establish SR again. He is not on any other potential nodal blocking agents   LABS K+ 3.7 BUN/Creat 27/1.91 (about his baseline) WBC 8.7 H/H 10.1/35 Plts 191  HS Trops 56 BNP 292  In  my review of Zio tracings AFib w/SVR 30's-40's with evidence of alternating BBB Slowest rates are nocturnal, though has brady with alternating BBB day time as well  Initially after his TAVR felt pretty decent, at his new baseline, but in the last few days, really has felt tired, no energy and worse stamina. 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 Cash and Dr. Shyrl   Severe aortic stenosis    Thrombocytopenia    Past Surgical History:  Procedure Laterality Date   COLONOSCOPY N/A 08/26/2016   Procedure: COLONOSCOPY;  Surgeon: Gwendlyn ONEIDA Buddy, MD;  Location: [facility] ENDOSCOPY;  Service: Endoscopy;  Laterality: N/A;   CORONARY/GRAFT ACUTE MI REVASCULARIZATION N/A 02/15/2024   Procedure: Coronary/Graft Acute MI Revascularization;  Surgeon: Wonda Sharper, MD;  Location: [facility] INVASIVE CV LAB;  Service: Cardiovascular;  Laterality:  N/A;   INTRAOPERATIVE TRANSTHORACIC ECHOCARDIOGRAM N/A 07/05/2024   Procedure: ECHOCARDIOGRAM, TRANSTHORACIC;  Surgeon: Verlin Lonni BIRCH, MD;  Location: [facility] INVASIVE CV LAB;  Service: Cardiovascular;  Laterality: N/A;   LEFT HEART CATH AND CORONARY ANGIOGRAPHY N/A 02/15/2024   Procedure: LEFT HEART CATH AND CORONARY ANGIOGRAPHY;  Surgeon: Wonda Sharper, MD;  Location: [facility] INVASIVE CV LAB;  Service: Cardiovascular;  Laterality: N/A;   RIGHT HEART CATH N/A 02/22/2024   Procedure: RIGHT HEART CATH;   Surgeon: Rolan Ezra RAMAN, MD;  Location: [facility] INVASIVE CV LAB;  Service: Cardiovascular;  Laterality: N/A;   TONSILLECTOMY  1957     Medications Prior to Admission: Prior to Admission medications   Medication Sig Start Date End Date Taking? Authorizing Provider  allopurinol (ZYLOPRIM) 100 MG tablet Take 200 mg by mouth daily. 03/09/24   [provider]  amiodarone  (PACERONE ) 200 MG tablet Take 1 tablet (200 mg total) by mouth daily. 03/01/24   Colletta Manuelita Garre, PA-C  apixaban  (ELIQUIS ) 5 MG TABS tablet Take 1 tablet (5 mg total) by mouth 2 (two) times daily. 02/29/24   Colletta Manuelita Garre, PA-C  clopidogrel  (PLAVIX ) 75 MG tablet Take 1 tablet (75 mg total) by mouth daily with breakfast. 03/29/24   Sabharwal, Aditya, DO  dapagliflozin  propanediol (FARXIGA ) 10 MG TABS tablet Take 1 tablet (10 mg total) by mouth daily. 04/13/24   Sabharwal, Aditya, DO  digoxin  (LANOXIN ) 0.125 MG tablet Take 1 tablet (0.125 mg total) by mouth daily. Patient taking differently: Take 0.125 mg by mouth daily. Pt stopped taking at our request for now 03/01/24   Colletta Manuelita Garre, PA-C  furosemide  (LASIX ) 40 MG tablet Take 1 tablet by mouth as needed for weight gain, lower extremity edema, increased shortness of breath 02/29/24   Colletta Manuelita Garre, PA-C  levothyroxine  (SYNTHROID ) 150 MCG tablet Take 150-300 mcg by mouth See admin instructions. Take 150mcg (1 tablet) by mouth 5 days a week, then take 300mcg (2 tablets) 2 days a week.    [provider]  pantoprazole  (PROTONIX ) 40 MG tablet Take 1 tablet (40 mg total) by mouth daily. 03/01/24   Colletta Manuelita Garre, PA-C  rosuvastatin  (CRESTOR ) 40 MG tablet TAKE 1 TABLET BY MOUTH EVERY DAY 06/08/24   Colletta Manuelita Garre, PA-C  spironolactone  (ALDACTONE ) 25 MG tablet Take 1 tablet (25 mg total) by mouth daily. 03/17/24   Lee, [place], NP  tamsulosin  (FLOMAX ) 0.4 MG CAPS capsule Take 0.4 mg by mouth. 03/09/24   [provider]     Allergies:    No Known Allergies  Social History:   Social History   Socioeconomic History   Marital status: Widowed    Spouse name: Not on file   Number of children: Not on file   Years of education: Not on file   Highest education level: Not on file  Occupational History   Occupation: retired in 3/17  Tobacco Use   Smoking status: Former    Current packs/day: 0.00    Average packs/day: 3.0 packs/day for 12.0 years (36.0 ttl pk-yrs)    Types: Cigarettes    Start date: 33    Quit date: 1980    Years since quitting: 45.8   Smokeless tobacco: Never  Vaping Use   Vaping status: Never Used  Substance and Sexual Activity   Alcohol use: Not Currently    Alcohol/week: 80.0 standard drinks of alcohol    Types: 80 Shots of liquor per week    Comment: Quit 4 years ago per  patient   Drug use: Not Currently    Types: Marijuana    Comment: 09/02/2016 tried marijuana 2 times; years ago   Sexual activity: Not Currently  Other Topics Concern   Not on file  Social History Narrative   Not on file   Social Drivers of Health   Financial Resource Strain: Low Risk  (12/17/2022)   Received from [facility]   Overall Financial Resource Strain (CARDIA)    Difficulty of Paying Living Expenses: Not hard at all  Food Insecurity: No Food Insecurity (07/05/2024)   Hunger Vital Sign    Worried About Running Out of Food in the Last Year: Never true    Ran Out of Food in the Last Year: Never true  Transportation Needs: No Transportation Needs (07/05/2024)   PRAPARE - Administrator, Civil Service (Medical): No    Lack of Transportation (Non-Medical): No  Physical Activity: Inactive (02/07/2022)   Received from [facility]   Exercise Vital Sign    On average, how many days per week do you engage in moderate to strenuous exercise (like a brisk walk)?: 0 days    On average, how many minutes do you engage in exercise at this level?: 0 min  Stress: No Stress Concern Present (02/07/2022)    Received from [facility] of Occupational Health - Occupational Stress Questionnaire    Feeling of Stress : Not at all  Social Connections: Moderately Isolated (07/05/2024)   Social Connection and Isolation Panel    Frequency of Communication with Friends and Family: Twice a week    Frequency of Social Gatherings with Friends and Family: Twice a week    Attends Religious Services: Never    Database administrator or Organizations: No    Attends Engineer, structural: More than 4 times per year    Marital Status: Widowed  Intimate Partner Violence: Not At Risk (07/05/2024)   Humiliation, Afraid, Rape, and Kick questionnaire    Fear of Current or Ex-Partner: No    Emotionally Abused: No    Physically Abused: No    Sexually Abused: No     Family History:   The patient's family history includes Heart disease in his father; Thyroid disease in his mother.    ROS:  Please see the history of present illness.  All other ROS reviewed and negative.     Physical Exam/Data: Vitals:   07/11/24 1248 07/11/24 1252  BP:  121/75  Pulse: (!) 31 (!) 38  Resp:  18  Temp:  98.2 F (36.8 C)  SpO2:  98%   No intake or output data in the 24 hours ending 07/11/24 1335    07/06/2024    4:30 AM 07/05/2024    9:04 AM 07/04/2024   10:00 AM  Last 3 Weights  Weight (lbs) 200 lb 4.8 oz 207 lb 207 lb  Weight (kg) 90.855 kg 93.895 kg 93.895 kg     There is no height or weight on file to calculate BMI.  General:  Well nourished, well developed, in no acute distress HEENT: normal Neck: no JVD Vascular: No carotid bruits   Cardiac:  irreg-irreg; no murmurs, gallops or rubs Lungs:  CTA b/ly, no wheezing, rhonchi or rales  Abd: soft, nontender Ext: no edema Musculoskeletal:  No deformities Skin: warm and dry  Neuro:  no focal abnormalities noted Psych:  Normal affect   EKG:  The ECG that was done toda was personally reviewed and  demonstrates AFlutter 49bpm,  LBBB  Relevant CV Studies:  07/06/24: TTE  1. Akinesis of the distal septum and apex; overall severe LV dysfunction;  s/p TAVR with mean gradient 7 mmHg and no AI.   2. Left ventricular ejection fraction, by estimation, is 25 to 30%. The  left ventricle has severely decreased function. The left ventricle  demonstrates regional wall motion abnormalities (see scoring  diagram/findings for description). Left ventricular  diastolic parameters are indeterminate.   3. Right ventricular systolic function is low normal. The right  ventricular size is mildly enlarged. There is moderately elevated  pulmonary artery systolic pressure. The estimated right ventricular  systolic pressure is 50.0 mmHg.   4. Left atrial size was moderately dilated.   5. Right atrial size was mildly dilated.   6. There is no evidence of cardiac tamponade.   7. The mitral valve is degenerative. Mild mitral valve regurgitation. No  evidence of mitral stenosis.   8. The aortic valve has been repaired/replaced. Aortic valve  regurgitation is not visualized. No aortic stenosis is present. There is a  29 mm Edwards Sapien prosthetic (TAVR) valve present in the aortic  position. Procedure Date: 07/05/2024. Echo  findings are consistent with normal structure and function of the aortic  valve prosthesis.   9. Aortic dilatation noted. There is mild dilatation of the ascending  aorta, measuring 40 mm.    04/27/24: c.MRI IMPRESSION: 1.  No LV thrombus seen   2. Subendocardial LGE in mid to apical anterior/anteroseptal walls, apical inferior wall, and apex. This is consistent with prior LAD territory infarct. LGE is greater than 50% transmural suggesting region is nonviable   3.  Mild LV dilatation with moderate systolic dysfunction (EF 37%)   4.  Normal RV size with mild systolic dysfunction (EF 45%)   5.  Moderate mitral regurgitation (regurgitant fraction 27%)   6.  Dilated main pulmonary artery measuring 34mm    7.  Dilated ascending aorta measuring 40mm   02/16/24: TTE 1. Akinesis of the mid septum into apex and inferior septum consistent  with LAD infarction. LVEF 15-20%. Cannot exclude a layered LV thrombus on  contrast imaging. Consider cardiac MRI for definitive evaluation. LVOT VTI  9 cm. CO estimated 1.8 L/min and  CI 0.8 L/min/m2, concerning for low output state. Left ventricular  ejection fraction, by estimation, is 15-20%. The left ventricle has  severely decreased function. The left ventricle demonstrates regional wall  motion abnormalities (see scoring  diagram/findings for description). There is moderate concentric left  ventricular hypertrophy. Indeterminate diastolic filling due to E-A  fusion.   2. Severely calcified aortic valve with restricted leaflet movement. Vmax  2.6 m/s, MG 16 mmHG, AVA 0.81 cm2, DI 0.24. This is concerning for low  flow low gradient severe aortic stenosis. Recommend clinical correlation.  The aortic valve is calcified.  Aortic valve regurgitation is trivial. Moderate to severe aortic valve  stenosis.   3. Right ventricular systolic function is normal. The right ventricular  size is mildly enlarged. Tricuspid regurgitation signal is inadequate for  assessing PA pressure.   4. The mitral valve is degenerative. Mild mitral valve regurgitation. No  evidence of mitral stenosis.   5. The inferior vena cava is dilated in size with >50% respiratory  variability, suggesting right atrial pressure of 8 mmHg.      02/15/24: LHC/PCI   Prox LAD to Mid LAD lesion is 100% stenosed.   A drug-eluting stent was successfully placed using a STENT SYNERGY XD  3.0X16.   Post intervention, there is a 0% residual stenosis.   LV end diastolic pressure is severely elevated.   The left ventricular ejection fraction is 25-35% by visual estimate.   Recommend uninterrupted dual antiplatelet therapy with Aspirin  81mg  daily and Clopidogrel  75mg  daily for a minimum of 12 months  (ACS-Class I recommendation).   1.  Acute, late presenting STEMI involving the LAD, treated successfully with PCI using PTCA followed by stenting of the proximal LAD with baseline TIMI 0 flow, restored to TIMI-3 flow at the completion of the procedure 2.  Mild nonobstructive plaquing in the left main, left circumflex, and RCA with a heavily calcified RCA 3.  Severe segmental LV dysfunction with akinesis of the entire anterolateral wall, apex, and inferoapex, LVEF estimated at approximately 25 to 30% 4.  Angiographic findings on ventriculography suspicious for LV apical thrombus 5.  Severely elevated LVEDP   Recommendations: DAPT with aspirin  and clopidogrel  minimum of 12 months, check 2D echo with contrast to evaluate for LV apical thrombus and accurately assess LVEF, post MI medical therapy, IV diuresis with furosemide  in the setting of high LVEDP (acute systolic heart failure secondary to acute anterior infarct).  Institute GDMT as tolerated.  Laboratory Data: High Sensitivity Troponin:  No results for input(s): TROPONINIHS in the last 720 hours.    Chemistry Recent Labs  Lab 07/05/24 1326 07/06/24 0323  NA 138 133*  K 4.1 4.4  CL 102 101  CO2  --  21*  GLUCOSE 167* 185*  BUN 19 18  CREATININE 1.60* 1.69*  CALCIUM   --  9.0  MG  --  1.7  GFRNONAA  --  42*  ANIONGAP  --  11    No results for input(s): PROT, ALBUMIN, AST, ALT, ALKPHOS, BILITOT in the last 168 hours. Lipids No results for input(s): CHOL, TRIG, HDL, LABVLDL, LDLCALC, CHOLHDL in the last 168 hours. Hematology Recent Labs  Lab 07/06/24 0323 07/11/24 1300  WBC 13.1* 8.7  RBC 4.31 4.32  HGB 10.0* 10.1*  HCT 34.1* 35.1*  MCV 79.1* 81.3  MCH 23.2* 23.4*  MCHC 29.3* 28.8*  RDW 17.5* 18.6*  PLT 208 191   Thyroid No results for input(s): TSH, FREET4 in the last 168 hours. BNPNo results for input(s): BNP, PROBNP in the last 168 hours.  DDimer No results for input(s): DDIMER in  the last 168 hours.  Radiology/Studies:  No results found.     Assessment and Plan: Persistent AFib/AFlutter (atypical) with slow ventricular response CHA2DS2Vasc is 5, on Eliquis  last dose was this morning (10/20 ~ 7am)  Symptomatic bradycardia He has evidence of alternating bundle on zio tracings He reports typically up/about/awake between 0300-0500 with later morning nap --- Last dose of dig 07/08/24 --- Chronic amiodarone  --- No other nodal blocking agents   PLAN: Admit to tele He will need CRT-D Post device >> advance GDMT with his outpt team as able  Discussed rational for pacing/ICD and CRT with the patient Discussed procedure, potential risks/benefits He is agreeable to proceed  Given his Eliquis  this morning and inability to hold his Plavix  (given STEMI/DES in May) > will likely look to Wed implant off Eliquis  Pt is aware and agreeable Resume OAC post implant pending pocket stability  ICM BNP is up some, though appears compensated by exam LVEF post TAVR 25-30% LBBB Home meds   CAD Never had any CP, but has not had the oppressive SOB like he did with his STEMI No symptoms of his CAD Will continue  his meds/plavix  C/w McLean/team  VHD S/p TAVR 10/14 C/w Dr. McAlhany/team  HTN Not on any BP meds home  DM Home Farxiga   For questions or updates, please contact [facility] Please consult [email or web address] for contact info under   Signed, Charlies Macario Arthur, PA-C  07/11/2024 1:35 PM

## 2024-07-11 NOTE — ED Triage Notes (Addendum)
 Pt had heart valve replacement last week and was told to come to ED a few hours ago due to HR being too low and SOB. Pt says cardiologist says he needs a pacemaker.

## 2024-07-11 NOTE — Telephone Encounter (Signed)
Calling to report abnormal EKG results. Call transferred

## 2024-07-11 NOTE — Telephone Encounter (Signed)
 Received call from [employer] about live alert that was received.      Cardiac Monitor Alert  Date of alert:  07/11/2024   Patient Name: Mark Meadows  DOB: Feb 06, 1951  MRN: [id number]   [facility] Cardiologist: Ezra Shuck, MD  [facility] EP:  None    Monitor Information: Long Term Monitor-Live Telemetry [ZioAT]  Reason:  ATRIAL FLUTTER Ordering provider:  Lamarr Hummer, PA-C   Alert Atrial Fibrillation/Flutter This is the 5th alert for this rhythm.  The patient has a hx of Atrial Fibrillation/Flutter.  The patient is not currently on anticoagulation.  Anticoagulation medication as of 07/11/2024           apixaban  (ELIQUIS ) 5 MG TABS tablet Take 1 tablet (5 mg total) by mouth 2 (two) times daily.       Next Cardiology Appointment   Date:  07/11/24  Provider:  Lamarr Hummer, PA-C  The patient was contacted today.  He is asymptomatic. Pt has telehealth visit to see Lamarr Hummer and pt was on the other line with the provider.      Lyle KATHEE Rigg, RN  07/11/2024 10:09 AM

## 2024-07-11 NOTE — ED Triage Notes (Signed)
 Pt reports associated sob with exertion over the past few days. As the initial note mentioned, pt does have a HR in the 30s. He has been wearing a ZIO patch since having aortic valve replacement on the 14th of this month. He is AxOx4.

## 2024-07-11 NOTE — ED Provider Triage Note (Signed)
 Emergency Medicine Provider Triage Evaluation Note  Mark Meadows , a 73 y.o. male  was evaluated in triage.  Patient reports that he was told that he had a low heart rate on his ZIO monitor at home.  He reports today that he did feel short of breath when walking to his mailbox, which he normally is not short of breath doing.  Denies any dizziness or chest pain.  He otherwise feels well and denies any symptoms at rest.  Review of Systems  Positive: As above Negative: As above  Physical Exam  BP 121/75   Pulse (!) 38   Temp 98.2 F (36.8 C)   Resp 18   SpO2 98%  Gen:   Awake, no distress   Resp:  Normal effort  MSK:   Moves extremities without difficulty    Medical Decision Making  Medically screening exam initiated at 1:05 PM.  Appropriate orders placed.  Mickey Esguerra was informed that the remainder of the evaluation will be completed by another provider, this initial triage assessment does not replace that evaluation, and the importance of remaining in the ED until their evaluation is complete.     Veta Palma, PA-C 07/11/24 1305

## 2024-07-12 DIAGNOSIS — I509 Heart failure, unspecified: Secondary | ICD-10-CM | POA: Diagnosis not present

## 2024-07-12 DIAGNOSIS — R001 Bradycardia, unspecified: Secondary | ICD-10-CM | POA: Diagnosis not present

## 2024-07-12 DIAGNOSIS — I4891 Unspecified atrial fibrillation: Secondary | ICD-10-CM | POA: Diagnosis not present

## 2024-07-12 DIAGNOSIS — I447 Left bundle-branch block, unspecified: Secondary | ICD-10-CM | POA: Diagnosis not present

## 2024-07-12 LAB — BASIC METABOLIC PANEL WITH GFR
Anion gap: 10 (ref 5–15)
BUN: 24 mg/dL — ABNORMAL HIGH (ref 8–23)
CO2: 21 mmol/L — ABNORMAL LOW (ref 22–32)
Calcium: 8.9 mg/dL (ref 8.9–10.3)
Chloride: 106 mmol/L (ref 98–111)
Creatinine, Ser: 1.91 mg/dL — ABNORMAL HIGH (ref 0.61–1.24)
GFR, Estimated: 36 mL/min — ABNORMAL LOW (ref >60)
Glucose, Bld: 170 mg/dL — ABNORMAL HIGH (ref 70–99)
Potassium: 3.6 mmol/L (ref 3.5–5.1)
Sodium: 137 mmol/L (ref 135–145)

## 2024-07-12 MED ORDER — LEVOTHYROXINE SODIUM 75 MCG PO TABS
150.0000 ug | ORAL_TABLET | ORAL | Status: DC
  Administered 2024-07-12 – 2024-07-15 (×3): 150 ug via ORAL
  Filled 2024-07-12 (×3): qty 2

## 2024-07-12 MED ORDER — HEPARIN (PORCINE) 25000 UT/250ML-% IV SOLN
1400.0000 [IU]/h | INTRAVENOUS | Status: AC
  Administered 2024-07-13: 1250 [IU]/h via INTRAVENOUS
  Filled 2024-07-12 (×2): qty 250

## 2024-07-12 MED ORDER — PANTOPRAZOLE SODIUM 40 MG PO TBEC
40.0000 mg | DELAYED_RELEASE_TABLET | Freq: Every day | ORAL | Status: DC
  Administered 2024-07-12 – 2024-07-15 (×4): 40 mg via ORAL
  Filled 2024-07-12 (×4): qty 1

## 2024-07-12 MED ORDER — HEPARIN BOLUS VIA INFUSION
4000.0000 [IU] | Freq: Once | INTRAVENOUS | Status: AC
  Administered 2024-07-12: 4000 [IU] via INTRAVENOUS
  Filled 2024-07-12: qty 4000

## 2024-07-12 MED ORDER — ALLOPURINOL 100 MG PO TABS
200.0000 mg | ORAL_TABLET | Freq: Every day | ORAL | Status: DC
  Administered 2024-07-12 – 2024-07-15 (×4): 200 mg via ORAL
  Filled 2024-07-12 (×4): qty 2

## 2024-07-12 MED ORDER — LEVOTHYROXINE SODIUM 100 MCG PO TABS
300.0000 ug | ORAL_TABLET | ORAL | Status: DC
  Administered 2024-07-13: 300 ug via ORAL
  Filled 2024-07-12: qty 3

## 2024-07-12 MED ORDER — ROSUVASTATIN CALCIUM 20 MG PO TABS
40.0000 mg | ORAL_TABLET | Freq: Every day | ORAL | Status: DC
  Administered 2024-07-12 – 2024-07-15 (×4): 40 mg via ORAL
  Filled 2024-07-12 (×4): qty 2

## 2024-07-12 MED ORDER — INFLUENZA VAC SPLIT HIGH-DOSE 0.5 ML IM SUSY
0.5000 mL | PREFILLED_SYRINGE | INTRAMUSCULAR | Status: AC
  Administered 2024-07-13: 0.5 mL via INTRAMUSCULAR
  Filled 2024-07-12: qty 0.5

## 2024-07-12 MED ORDER — SPIRONOLACTONE 25 MG PO TABS
25.0000 mg | ORAL_TABLET | Freq: Every day | ORAL | Status: DC
  Administered 2024-07-12 – 2024-07-15 (×4): 25 mg via ORAL
  Filled 2024-07-12 (×4): qty 1

## 2024-07-12 MED ORDER — TAMSULOSIN HCL 0.4 MG PO CAPS
0.4000 mg | ORAL_CAPSULE | Freq: Every day | ORAL | Status: DC
  Administered 2024-07-12 – 2024-07-15 (×4): 0.4 mg via ORAL
  Filled 2024-07-12 (×4): qty 1

## 2024-07-12 MED ORDER — LEVOTHYROXINE SODIUM 75 MCG PO TABS: 150.0000 ug | ORAL_TABLET | ORAL | Status: DC

## 2024-07-12 MED ORDER — CLOPIDOGREL BISULFATE 75 MG PO TABS
75.0000 mg | ORAL_TABLET | Freq: Every day | ORAL | Status: DC
  Administered 2024-07-12 – 2024-07-15 (×4): 75 mg via ORAL
  Filled 2024-07-12 (×4): qty 1

## 2024-07-12 MED ORDER — DAPAGLIFLOZIN PROPANEDIOL 10 MG PO TABS
10.0000 mg | ORAL_TABLET | Freq: Every day | ORAL | Status: DC
  Administered 2024-07-12: 10 mg via ORAL
  Filled 2024-07-12: qty 1

## 2024-07-12 MED ORDER — POTASSIUM CHLORIDE CRYS ER 20 MEQ PO TBCR
40.0000 meq | EXTENDED_RELEASE_TABLET | Freq: Once | ORAL | Status: AC
  Administered 2024-07-12: 40 meq via ORAL
  Filled 2024-07-12: qty 2

## 2024-07-12 NOTE — Progress Notes (Addendum)
 Given lab/EP schedule Implant scheduled for Thursday Patient is aware.  W/history of LV thrombus and AFib Will plan heparin  gtt today Reached out to pharmacist/ordered pharmacy consult   Will go ahead and hold his Farxiga  for his procedure Appreciate pharm team K+ replaced  Charlies Arthur, PA-C

## 2024-07-12 NOTE — Progress Notes (Signed)
 Rounding Note   Patient Name: Mark Meadows Date of Encounter: 07/12/2024  [facility] Cardiologist: Ezra Shuck, MD   Subjective   I feel fine!  Scheduled Meds:  allopurinol  200 mg Oral Daily   clopidogrel   75 mg Oral Q breakfast   dapagliflozin  propanediol  10 mg Oral Daily   levothyroxine   150 mcg Oral Once per day on Monday Tuesday Thursday Friday Saturday   And   [START ON 07/13/2024] levothyroxine   300 mcg Oral Once per day on Sunday Wednesday   pantoprazole  40 mg Oral Daily   rosuvastatin  40 mg Oral Daily   spironolactone  25 mg Oral Daily   tamsulosin  0.4 mg Oral Daily   Continuous Infusions:  PRN Meds: acetaminophen, nitroGLYCERIN   Vital Signs  Vitals:   07/12/24 0329 07/12/24 0400 07/12/24 0405 07/12/24 0919  BP: 98/67   122/85  Pulse: (!) 55 (!) 50 (!) 51 (!) 48  Resp: 19 20 19 16  Temp:    98 F (36.7 C)  TempSrc:    Oral  SpO2: 97% 100% 100% 98%    Intake/Output Summary (Last 24 hours) at 07/12/2024 1002 Last data filed at 07/11/2024 2357 Gross per 24 hour  Intake --  Output 500 ml  Net -500 ml      10 /15/2025    4:30 AM 07/05/2024    9:04 AM 07/04/2024   10:00 AM  Last 3 Weights  Weight (lbs) 200 lb 4.8 oz 207 lb 207 lb  Weight (kg) 90.855 kg 93.895 kg 93.895 kg      Telemetry  AFib 30's- 60's in/out BBB morphology with a more narrow (though perhaps icRBBB) - Personally Reviewed  ECG   No new EKGs - Personally Reviewed  Physical Exam  GEN: No acute distress.   Neck: No JVD Cardiac: irreg-irreg, no murmurs, rubs, or gallops.  Respiratory: Clear to auscultation bilaterally. GI: Soft, nontender, non-distended  MS: No edema; No deformity. Neuro:  Nonfocal  Psych: Normal affect   Labs High Sensitivity Troponin:   Recent Labs  Lab 07/11/24 1300  TROPONINIHS 56*     Chemistry Recent Labs  Lab 07/06/24 0323 07/11/24 1300 07/12/24 0317  NA 133* 134* 137  K 4.4 3.7 3.6  CL 101 104 106  CO2 21* 19*  21*  GLUCOSE 185* 155* 170*  BUN 18 27* 24*  CREATININE 1.69* 1.91* 1.91*  CALCIUM  9.0 8.9 8.9  MG 1.7  --   --   GFRNONAA 42* 36* 36*  ANIONGAP 11 11 10     Lipids No results for input(s): CHOL, TRIG, HDL, LABVLDL, LDLCALC, CHOLHDL in the last 168 hours.  Hematology Recent Labs  Lab 07/05/24 1326 07/06/24 0323 07/11/24 1300  WBC  --  13.1* 8.7  RBC  --  4.31 4.32  HGB 10.9* 10.0* 10.1*  HCT 32.0* 34.1* 35.1*  MCV  --  79.1* 81.3  MCH  --  23.2* 23.4*  MCHC  --  29.3* 28.8*  RDW  --  17.5* 18.6*  PLT  --  208 191   Thyroid  Recent Labs  Lab 07/11/24 1300  TSH 14.402*    BNP Recent Labs  Lab 07/11/24 1300  BNP 292.8*    DDimer No results for input(s): DDIMER in the last 168 hours.   Radiology  DG Chest Portable 1 View Result Date: 07/11/2024 CLINICAL DATA:  Bradycardia, shortness of breath EXAM: PORTABLE CHEST 1 VIEW COMPARISON:  07/01/2024 FINDINGS: Cardiomegaly. Large paraesophageal hernia. No  acute appearing airspace opacity. Multiple chronic bilateral rib fractures. IMPRESSION: 1. Cardiomegaly. 2. Large paraesophageal hernia. 3. No acute appearing airspace opacity. Electronically Signed   By: Marolyn JONETTA Jaksch M.D.   On: 07/11/2024 14:26    Cardiac Studies  07/06/24: TTE  1. Akinesis of the distal septum and apex; overall severe LV dysfunction;  s/p TAVR with mean gradient 7 mmHg and no AI.   2. Left ventricular ejection fraction, by estimation, is 25 to 30%. The  left ventricle has severely decreased function. The left ventricle  demonstrates regional wall motion abnormalities (see scoring  diagram/findings for description). Left ventricular  diastolic parameters are indeterminate.   3. Right ventricular systolic function is low normal. The right  ventricular size is mildly enlarged. There is moderately elevated  pulmonary artery systolic pressure. The estimated right ventricular  systolic pressure is 50.0 mmHg.   4. Left atrial size was  moderately dilated.   5. Right atrial size was mildly dilated.   6. There is no evidence of cardiac tamponade.   7. The mitral valve is degenerative. Mild mitral valve regurgitation. No  evidence of mitral stenosis.   8. The aortic valve has been repaired/replaced. Aortic valve  regurgitation is not visualized. No aortic stenosis is present. There is a  29 mm Edwards Sapien prosthetic (TAVR) valve present in the aortic  position. Procedure Date: 07/05/2024. Echo  findings are consistent with normal structure and function of the aortic  valve prosthesis.   9. Aortic dilatation noted. There is mild dilatation of the ascending  aorta, measuring 40 mm.      04/27/24: c.MRI IMPRESSION: 1.  No LV thrombus seen   2. Subendocardial LGE in mid to apical anterior/anteroseptal walls, apical inferior wall, and apex. This is consistent with prior LAD territory infarct. LGE is greater than 50% transmural suggesting region is nonviable   3.  Mild LV dilatation with moderate systolic dysfunction (EF 37%)   4.  Normal RV size with mild systolic dysfunction (EF 45%)   5.  Moderate mitral regurgitation (regurgitant fraction 27%)   6.  Dilated main pulmonary artery measuring 34mm   7.  Dilated ascending aorta measuring 40mm     02/16/24: TTE 1. Akinesis of the mid septum into apex and inferior septum consistent  with LAD infarction. LVEF 15-20%. Cannot exclude a layered LV thrombus on  contrast imaging. Consider cardiac MRI for definitive evaluation. LVOT VTI  9 cm. CO estimated 1.8 L/min and  CI 0.8 L/min/m2, concerning for low output state. Left ventricular  ejection fraction, by estimation, is 15-20%. The left ventricle has  severely decreased function. The left ventricle demonstrates regional wall  motion abnormalities (see scoring  diagram/findings for description). There is moderate concentric left  ventricular hypertrophy. Indeterminate diastolic filling due to E-A  fusion.   2.  Severely calcified aortic valve with restricted leaflet movement. Vmax  2.6 m/s, MG 16 mmHG, AVA 0.81 cm2, DI 0.24. This is concerning for low  flow low gradient severe aortic stenosis. Recommend clinical correlation.  The aortic valve is calcified.  Aortic valve regurgitation is trivial. Moderate to severe aortic valve  stenosis.   3. Right ventricular systolic function is normal. The right ventricular  size is mildly enlarged. Tricuspid regurgitation signal is inadequate for  assessing PA pressure.   4. The mitral valve is degenerative. Mild mitral valve regurgitation. No  evidence of mitral stenosis.   5. The inferior vena cava is dilated in size with >50% respiratory  variability, suggesting  right atrial pressure of 8 mmHg.        02/15/24: LHC/PCI   Prox LAD to Mid LAD lesion is 100% stenosed.   A drug-eluting stent was successfully placed using a STENT SYNERGY XD 3.0X16.   Post intervention, there is a 0% residual stenosis.   LV end diastolic pressure is severely elevated.   The left ventricular ejection fraction is 25-35% by visual estimate.   Recommend uninterrupted dual antiplatelet therapy with Aspirin  81mg  daily and Clopidogrel  75mg  daily for a minimum of 12 months (ACS-Class I recommendation).   1.  Acute, late presenting STEMI involving the LAD, treated successfully with PCI using PTCA followed by stenting of the proximal LAD with baseline TIMI 0 flow, restored to TIMI-3 flow at the completion of the procedure 2.  Mild nonobstructive plaquing in the left main, left circumflex, and RCA with a heavily calcified RCA 3.  Severe segmental LV dysfunction with akinesis of the entire anterolateral wall, apex, and inferoapex, LVEF estimated at approximately 25 to 30% 4.  Angiographic findings on ventriculography suspicious for LV apical thrombus 5.  Severely elevated LVEDP   Recommendations: DAPT with aspirin  and clopidogrel  minimum of 12 months, check 2D echo with contrast to  evaluate for LV apical thrombus and accurately assess LVEF, post MI medical therapy, IV diuresis with furosemide  in the setting of high LVEDP (acute systolic heart failure secondary to acute anterior infarct).  Institute GDMT as tolerated.  Patient Profile   73 y.o. 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 ICM  Admitted with progressive fatigue, DOE > symptomatic bradycardai  Assessment & Plan   Persistent AFib/AFlutter (atypical) with slow ventricular response CHA2DS2Vasc is 5, on Eliquis  last dose was this morning (10/20 ~ 7am)   Symptomatic bradycardia He has evidence of alternating bundle on zio tracings He reports typically up/about/awake between 0300-0500 with later morning nap --- Last dose of dig 07/08/24 --- Chronic amiodarone  --- No other nodal blocking agents     PLAN: He will need CRT-D Post device >> advance GDMT with his outpt team as able   Discussed rational for pacing/ICD and CRT with the patient Discussed procedure, potential risks/benefits He is agreeable to proceed   Given his Eliquis  10/20  morning and inability to hold his Plavix  (given STEMI/DES in May) > will likely look to Wed implant off Eliquis  Pt is aware and agreeable Resume OAC post implant pending pocket stability  TIMELINE for implant is unclear given lab schedule NPO after MN tonight   ICM BNP is up some, though appears compensated by exam LVEF post TAVR 25-30% LBBB Home meds     CAD Never had any CP, but has not had the oppressive SOB like he did with his STEMI No symptoms of his CAD Will continue his meds/plavix  C/w McLean/team   VHD S/p TAVR 10/14 C/w Dr. McAlhany/team   HTN Not on any BP meds home   DM Home Farxiga    For questions or updates,  please contact [facility] Please consult [email or web address] for contact info under   Signed, Charlies Macario Arthur, PA-C  07/12/2024, 10:02 AM

## 2024-07-12 NOTE — Progress Notes (Signed)
 PHARMACY - ANTICOAGULATION CONSULT NOTE  Pharmacy Consult for Heparin  while Eliquis  on  hold Indication: atrial fibrillation, LV thrombus  No Known Allergies  Patient Measurements:    Vital Signs: Temp: 97.8 F (36.6 C) (10/21 1536) Temp Source: Oral (10/21 1536) BP: 112/72 (10/21 1536) Pulse Rate: 48 (10/21 1536)  Labs: Recent Labs    07/11/24 1300 07/12/24 0317  HGB 10.1*  --   HCT 35.1*  --   PLT 191  --   CREATININE 1.91* 1.91*  TROPONINIHS 56*  --     Estimated Creatinine Clearance: 37.8 mL/min (A) (by C-G formula based on SCr of 1.91 mg/dL (H)).   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 Verlin and Dr. Shyrl   Severe aortic stenosis    Thrombocytopenia     Medications:  Scheduled:   allopurinol  200 mg Oral Daily   clopidogrel   75 mg Oral Q breakfast   levothyroxine   150 mcg Oral Once per day on Monday Tuesday Thursday Friday Saturday   And   [START ON 07/13/2024] levothyroxine   300 mcg Oral Once per day on Sunday Wednesday   pantoprazole   40 mg Oral Daily   rosuvastatin   40 mg Oral Daily   spironolactone   25 mg Oral Daily   tamsulosin   0.4 mg Oral Daily   Infusions:  PRN: acetaminophen , nitroGLYCERIN   Assessment: 73 yo male on chronic Eliquis  for hx Afib and LV thrombus now with plans for CRT-D. Pharmacy consulted to transition to IV heparin  in anticipation of procedure. Last dose of Eliquis  was 10/20 at 09:30.  Goal of Therapy:   Heparin  level 0.3-0.7 units/ml aPTT 66-102 seconds Monitor platelets by anticoagulation protocol: Yes   Plan:  Heparin  4000 units IV bolus Heparin  infusion 1100 units/hr Check aPTT and heparin  level in 8hrs and daily until correlation Continue to monitor H&H and platelets  Stop heparin  07/14/24 at 0001 for device implant   Rocky Slade, PharmD, BCPS 07/12/2024,4:37 PM  Please check AMION for all [facility] Pharmacy phone numbers After 10:00 PM, call Main Pharmacy [phone]

## 2024-07-12 NOTE — Plan of Care (Signed)
°  Problem: Clinical Measurements: Goal: Will remain free from infection Outcome: Progressing   Problem: Activity: Goal: Risk for activity intolerance will decrease Outcome: Progressing   Problem: Nutrition: Goal: Adequate nutrition will be maintained Outcome: Progressing

## 2024-07-12 NOTE — Progress Notes (Signed)
 Mobility Specialist Progress Note:   07/12/24 0950  Mobility  Activity Ambulated with assistance  Level of Assistance Standby assist, set-up cues, supervision of patient - no hands on  Assistive Device Front wheel walker;None  Distance Ambulated (ft) 400 ft  Activity Response Tolerated well  Mobility Referral Yes  Mobility visit 1 Mobility  Mobility Specialist Start Time (ACUTE ONLY) 0950  Mobility Specialist Stop Time (ACUTE ONLY) 1005  Mobility Specialist Time Calculation (min) (ACUTE ONLY) 15 min   Pt agreeable to mobility session. Required no physical assistance, only supervision for safety. Cues given for RW proximity, as pt lets RW get too far ahead. Little correction noted, attempted ambulation with no AD, steady gait throughout. HR 60s with exertion, minimal SOB noted, SpO2 high 90s on RA. Back in bed with all needs met.   Therisa Rana Mobility Specialist Please contact via SecureChat or  Rehab office at [phone]

## 2024-07-12 NOTE — ED Notes (Signed)
Pharm tech at bedside 

## 2024-07-12 NOTE — Progress Notes (Deleted)
 Discharge education provided to the patient, patient is aware regarding follow up appointments and TOC meds, PIV removed, CCMD notified, patient will be transferred to D/C lounge, will let the d/c lounge staffs to pick up his TOC meds, had no any concerns during d/c.

## 2024-07-13 DIAGNOSIS — R001 Bradycardia, unspecified: Secondary | ICD-10-CM | POA: Diagnosis not present

## 2024-07-13 LAB — BASIC METABOLIC PANEL WITH GFR
Anion gap: 9 (ref 5–15)
BUN: 24 mg/dL — ABNORMAL HIGH (ref 8–23)
CO2: 18 mmol/L — ABNORMAL LOW (ref 22–32)
Calcium: 9.1 mg/dL (ref 8.9–10.3)
Chloride: 109 mmol/L (ref 98–111)
Creatinine, Ser: 1.65 mg/dL — ABNORMAL HIGH (ref 0.61–1.24)
GFR, Estimated: 43 mL/min — ABNORMAL LOW (ref >60)
Glucose, Bld: 135 mg/dL — ABNORMAL HIGH (ref 70–99)
Potassium: 4.3 mmol/L (ref 3.5–5.1)
Sodium: 136 mmol/L (ref 135–145)

## 2024-07-13 LAB — APTT
aPTT: 43 s — ABNORMAL HIGH (ref 24–36)
aPTT: 46 s — ABNORMAL HIGH (ref 24–36)

## 2024-07-13 LAB — GLUCOSE, CAPILLARY
Glucose-Capillary: 209 mg/dL — ABNORMAL HIGH (ref 70–99)
Glucose-Capillary: 148 mg/dL — ABNORMAL HIGH (ref 70–99)

## 2024-07-13 LAB — HEPARIN LEVEL (UNFRACTIONATED)
Heparin Unfractionated: 0.32 [IU]/mL (ref 0.30–0.70)
Heparin Unfractionated: 0.31 [IU]/mL (ref 0.30–0.70)

## 2024-07-13 LAB — SURGICAL PCR SCREEN
MRSA, PCR: NEGATIVE
Staphylococcus aureus: POSITIVE — AB

## 2024-07-13 MED ORDER — CEFAZOLIN SODIUM-DEXTROSE 2-4 GM/100ML-% IV SOLN: 2.0000 g | INTRAVENOUS | Status: AC

## 2024-07-13 MED ORDER — INSULIN ASPART 100 UNIT/ML IJ SOLN
0.0000 [IU] | Freq: Three times a day (TID) | INTRAMUSCULAR | Status: DC
  Administered 2024-07-14 – 2024-07-15 (×2): 2 [IU] via SUBCUTANEOUS

## 2024-07-13 MED ORDER — SODIUM CHLORIDE 0.9% FLUSH
3.0000 mL | Freq: Two times a day (BID) | INTRAVENOUS | Status: DC
  Administered 2024-07-13 – 2024-07-15 (×4): 3 mL via INTRAVENOUS

## 2024-07-13 MED ORDER — MUPIROCIN 2 % EX OINT
1.0000 | TOPICAL_OINTMENT | Freq: Two times a day (BID) | CUTANEOUS | Status: DC
  Administered 2024-07-13 – 2024-07-15 (×4): 1 via NASAL
  Filled 2024-07-13 (×2): qty 22

## 2024-07-13 MED ORDER — SODIUM CHLORIDE 0.9 % IV SOLN: 80.0000 mg | INTRAVENOUS | Status: AC

## 2024-07-13 MED ORDER — CHLORHEXIDINE GLUCONATE CLOTH 2 % EX PADS
6.0000 | MEDICATED_PAD | Freq: Every day | CUTANEOUS | Status: DC
  Administered 2024-07-14: 6 via TOPICAL

## 2024-07-13 MED ORDER — SODIUM CHLORIDE 0.9% FLUSH: 3.0000 mL | INTRAVENOUS | Status: DC | PRN

## 2024-07-13 MED ORDER — SODIUM CHLORIDE 0.9 % IV SOLN: INTRAVENOUS | Status: DC

## 2024-07-13 MED ORDER — CHLORHEXIDINE GLUCONATE 4 % EX SOLN
60.0000 mL | Freq: Once | CUTANEOUS | Status: AC
  Administered 2024-07-14: 4 via TOPICAL
  Filled 2024-07-13: qty 60

## 2024-07-13 MED ORDER — CHLORHEXIDINE GLUCONATE 4 % EX SOLN
60.0000 mL | Freq: Once | CUTANEOUS | Status: AC
  Administered 2024-07-13: 4 via TOPICAL
  Filled 2024-07-13: qty 60

## 2024-07-13 NOTE — Progress Notes (Signed)
 Rounding Note   Patient Name: Mark Meadows Date of Encounter: 07/13/2024  [facility] Cardiologist: Ezra Shuck, MD   Subjective  I'm OK, no CP, palpitations, SOB  Scheduled Meds:  allopurinol  200 mg Oral Daily   clopidogrel   75 mg Oral Q breakfast   Influenza vac split trivalent PF  0.5 mL Intramuscular Tomorrow-1000   levothyroxine   150 mcg Oral Once per day on Monday Tuesday Thursday Friday Saturday   And   levothyroxine   300 mcg Oral Once per day on Sunday Wednesday   pantoprazole  40 mg Oral Daily   rosuvastatin  40 mg Oral Daily   spironolactone  25 mg Oral Daily   tamsulosin  0.4 mg Oral Daily   Continuous Infusions:  heparin 1,250 Units/hr (07/13/24 0523)   PRN Meds: acetaminophen, nitroGLYCERIN   Vital Signs  Vitals:   07/13/24 0145 07/13/24 0150 07/13/24 0200 07/13/24 0400  BP:    (!) 104/59  Pulse: (!) 44 (!) 41 (!) 45 (!) 32  Resp: (!) 28 14 16 11  Temp:    97.8 F (36.6 C)  TempSrc:    Oral  SpO2: 94% 98% 97% 99%  Weight:      Height:        Intake/Output Summary (Last 24 hours) at 07/13/2024 0756 Last data filed at 07/13/2024 0200 Gross per 24 hour  Intake 480 ml  Output 800 ml  Net -320 ml      10 /21/2025    6:00 PM 07/06/2024    4:30 AM 07/05/2024    9:04 AM  Last 3 Weights  Weight (lbs) 198 lb 9.6 oz 200 lb 4.8 oz 207 lb  Weight (kg) 90.084 kg 90.855 kg 93.895 kg      Telemetry  AFib 30's- 60's in/out BBB morphology with a more narrow (though perhaps icRBBB) - Personally Reviewed  ECG   No new EKGs - Personally Reviewed  Physical Exam  Remains unchanged GEN: No acute distress.   Neck: No JVD Cardiac: irreg-irreg, no murmurs, rubs, or gallops.  Respiratory: CTA b/l. GI: Soft, nontender, non-distended  MS: No edema; No deformity. Neuro:  Nonfocal  Psych: Normal affect   Labs High Sensitivity Troponin:   Recent Labs  Lab 07/11/24 1300  TROPONINIHS 56*     Chemistry Recent Labs  Lab 07/11/24 1300  07/12/24 0317 07/13/24 0340  NA 134* 137 136  K 3.7 3.6 4.3  CL 104 106 109  CO2 19* 21* 18*  GLUCOSE 155* 170* 135*  BUN 27* 24* 24*  CREATININE 1.91* 1.91* 1.65*  CALCIUM  8.9 8.9 9.1  GFRNONAA 36* 36* 43*  ANIONGAP 11 10 9     Lipids No results for input(s): CHOL, TRIG, HDL, LABVLDL, LDLCALC, CHOLHDL in the last 168 hours.  Hematology Recent Labs  Lab 07/11/24 1300  WBC 8.7  RBC 4.32  HGB 10.1*  HCT 35.1*  MCV 81.3  MCH 23.4*  MCHC 28.8*  RDW 18.6*  PLT 191   Thyroid  Recent Labs  Lab 07/11/24 1300  TSH 14.402*    BNP Recent Labs  Lab 07/11/24 1300  BNP 292.8*    DDimer No results for input(s): DDIMER in the last 168 hours.   Radiology  DG Chest Portable 1 View Result Date: 07/11/2024 CLINICAL DATA:  Bradycardia, shortness of breath EXAM: PORTABLE CHEST 1 VIEW COMPARISON:  07/01/2024 FINDINGS: Cardiomegaly. Large paraesophageal hernia. No acute appearing airspace opacity. Multiple chronic bilateral rib fractures. IMPRESSION: 1. Cardiomegaly. 2. Large paraesophageal hernia. 3. No  acute appearing airspace opacity. Electronically Signed   By: Marolyn JONETTA Jaksch M.D.   On: 07/11/2024 14:26    Cardiac Studies  07/06/24: TTE  1. Akinesis of the distal septum and apex; overall severe LV dysfunction;  s/p TAVR with mean gradient 7 mmHg and no AI.   2. Left ventricular ejection fraction, by estimation, is 25 to 30%. The  left ventricle has severely decreased function. The left ventricle  demonstrates regional wall motion abnormalities (see scoring  diagram/findings for description). Left ventricular  diastolic parameters are indeterminate.   3. Right ventricular systolic function is low normal. The right  ventricular size is mildly enlarged. There is moderately elevated  pulmonary artery systolic pressure. The estimated right ventricular  systolic pressure is 50.0 mmHg.   4. Left atrial size was moderately dilated.   5. Right atrial size was mildly  dilated.   6. There is no evidence of cardiac tamponade.   7. The mitral valve is degenerative. Mild mitral valve regurgitation. No  evidence of mitral stenosis.   8. The aortic valve has been repaired/replaced. Aortic valve  regurgitation is not visualized. No aortic stenosis is present. There is a  29 mm Edwards Sapien prosthetic (TAVR) valve present in the aortic  position. Procedure Date: 07/05/2024. Echo  findings are consistent with normal structure and function of the aortic  valve prosthesis.   9. Aortic dilatation noted. There is mild dilatation of the ascending  aorta, measuring 40 mm.      04/27/24: c.MRI IMPRESSION: 1.  No LV thrombus seen   2. Subendocardial LGE in mid to apical anterior/anteroseptal walls, apical inferior wall, and apex. This is consistent with prior LAD territory infarct. LGE is greater than 50% transmural suggesting region is nonviable   3.  Mild LV dilatation with moderate systolic dysfunction (EF 37%)   4.  Normal RV size with mild systolic dysfunction (EF 45%)   5.  Moderate mitral regurgitation (regurgitant fraction 27%)   6.  Dilated main pulmonary artery measuring 34mm   7.  Dilated ascending aorta measuring 40mm     02/16/24: TTE 1. Akinesis of the mid septum into apex and inferior septum consistent  with LAD infarction. LVEF 15-20%. Cannot exclude a layered LV thrombus on  contrast imaging. Consider cardiac MRI for definitive evaluation. LVOT VTI  9 cm. CO estimated 1.8 L/min and  CI 0.8 L/min/m2, concerning for low output state. Left ventricular  ejection fraction, by estimation, is 15-20%. The left ventricle has  severely decreased function. The left ventricle demonstrates regional wall  motion abnormalities (see scoring  diagram/findings for description). There is moderate concentric left  ventricular hypertrophy. Indeterminate diastolic filling due to E-A  fusion.   2. Severely calcified aortic valve with restricted leaflet  movement. Vmax  2.6 m/s, MG 16 mmHG, AVA 0.81 cm2, DI 0.24. This is concerning for low  flow low gradient severe aortic stenosis. Recommend clinical correlation.  The aortic valve is calcified.  Aortic valve regurgitation is trivial. Moderate to severe aortic valve  stenosis.   3. Right ventricular systolic function is normal. The right ventricular  size is mildly enlarged. Tricuspid regurgitation signal is inadequate for  assessing PA pressure.   4. The mitral valve is degenerative. Mild mitral valve regurgitation. No  evidence of mitral stenosis.   5. The inferior vena cava is dilated in size with >50% respiratory  variability, suggesting right atrial pressure of 8 mmHg.        02/15/24: LHC/PCI   Prox  LAD to Mid LAD lesion is 100% stenosed.   A drug-eluting stent was successfully placed using a STENT SYNERGY XD 3.0X16.   Post intervention, there is a 0% residual stenosis.   LV end diastolic pressure is severely elevated.   The left ventricular ejection fraction is 25-35% by visual estimate.   Recommend uninterrupted dual antiplatelet therapy with Aspirin  81mg  daily and Clopidogrel  75mg  daily for a minimum of 12 months (ACS-Class I recommendation).   1.  Acute, late presenting STEMI involving the LAD, treated successfully with PCI using PTCA followed by stenting of the proximal LAD with baseline TIMI 0 flow, restored to TIMI-3 flow at the completion of the procedure 2.  Mild nonobstructive plaquing in the left main, left circumflex, and RCA with a heavily calcified RCA 3.  Severe segmental LV dysfunction with akinesis of the entire anterolateral wall, apex, and inferoapex, LVEF estimated at approximately 25 to 30% 4.  Angiographic findings on ventriculography suspicious for LV apical thrombus 5.  Severely elevated LVEDP   Recommendations: DAPT with aspirin  and clopidogrel  minimum of 12 months, check 2D echo with contrast to evaluate for LV apical thrombus and accurately assess LVEF,  post MI medical therapy, IV diuresis with furosemide  in the setting of high LVEDP (acute systolic heart failure secondary to acute anterior infarct).  Institute GDMT as tolerated.  Patient Profile   73 y.o. 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 ICM  Admitted with progressive fatigue, DOE > symptomatic bradycardai  Assessment & Plan   Persistent AFib/AFlutter (atypical) with slow ventricular response CHA2DS2Vasc is 5, on Eliquis  last dose was this morning (10/20 ~ 7am)   Symptomatic bradycardia He has evidence of alternating bundle on zio tracings He reports typically up/about/awake between 0300-0500 with later morning nap --- Last dose of dig 07/08/24 (dig level <0.6) --- Chronic amiodarone  > held here --- No other nodal blocking agents     PLAN: He will need CRT-D Post device >> advance GDMT with his outpt team as able   Discussed rational for pacing/ICD and CRT with the patient Discussed procedure, potential risks/benefits He remains agreeable to proceed   Given his Eliquis  10/20  morning and inability to hold his Plavix  (given STEMI/DES in May) > will likely look to Wed implant off Eliquis  Pt is aware and agreeable Resume OAC post implant pending pocket stability  TIMELINE for implant is tomorrow (10/23) NPO after MN tonight Heparin  gtt off at MN   ICM BNP is up some, though appears compensated by exam LVEF post TAVR 25-30% LBBB Home meds     CAD Never had any CP, but has not had the oppressive SOB like he did with his STEMI No symptoms of his CAD Will continue his meds/plavix  C/w McLean/team   VHD S/p TAVR 10/14 C/w Dr. McAlhany/team   HTN Not on any BP meds home   DM Home Farxiga  > held for procedure    For questions or  updates, please contact [facility] Please consult [email or web address] for contact info under   Signed, Charlies Macario Arthur, PA-C  07/13/2024, 7:56 AM

## 2024-07-13 NOTE — Progress Notes (Signed)
 PHARMACY - ANTICOAGULATION CONSULT NOTE  Pharmacy Consult for Heparin  while Eliquis  on  hold Indication: atrial fibrillation, LV thrombus  No Known Allergies  Patient Measurements: Height: 5' 9 (175.3 cm) Weight: 90.1 kg (198 lb 9.6 oz) IBW/kg (Calculated) : 70.7  Vital Signs: Temp: 97.7 F (36.5 C) (10/22 0000) Temp Source: Oral (10/22 0000) BP: 125/69 (10/22 0130) Pulse Rate: 41 (10/22 0150)  Labs: Recent Labs    07/11/24 1300 07/12/24 0317 07/13/24 0340  HGB 10.1*  --   --   HCT 35.1*  --   --   PLT 191  --   --   APTT  --   --  43*  HEPARINUNFRC  --   --  0.32  CREATININE 1.91* 1.91* 1.65*  TROPONINIHS 56*  --   --     Estimated Creatinine Clearance: 43.6 mL/min (A) (by C-G formula based on SCr of 1.65 mg/dL (H)).   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 Verlin and Dr. Shyrl   Severe aortic stenosis    Thrombocytopenia     Medications:  Scheduled:   allopurinol  200 mg Oral Daily   clopidogrel   75 mg Oral Q breakfast   Influenza vac split trivalent PF  0.5 mL Intramuscular Tomorrow-1000   levothyroxine   150 mcg Oral Once per day on Monday Tuesday Thursday Friday Saturday   And   levothyroxine   300 mcg Oral Once per day on Sunday Wednesday   pantoprazole   40 mg Oral Daily   rosuvastatin   40 mg Oral Daily   spironolactone   25 mg Oral Daily   tamsulosin   0.4 mg Oral Daily   Infusions:   heparin  1,100 Units/hr (07/12/24 1720)   PRN:  acetaminophen , nitroGLYCERIN   Assessment: 73 yo male on chronic Eliquis  for hx Afib and LV thrombus now with plans for CRT-D. Pharmacy consulted to transition to IV heparin  in anticipation of procedure. Last dose of Eliquis  was 10/20 at 09:30.  10/22 AM update:  aPTT sub-therapeutic   Goal of Therapy:  Heparin  level 0.3-0.7 units/ml aPTT 66-102 seconds Monitor platelets by anticoagulation protocol: Yes   Plan:  Inc heparin  to 1250 units/hr Check aPTT and heparin  level in 8hrs and daily until correlation Continue to monitor H&H and platelets  Stop heparin  07/14/24 at 0001 for device implant   Lynwood Mckusick, PharmD, BCPS Clinical Pharmacist Phone: [phone]

## 2024-07-13 NOTE — Progress Notes (Signed)
 PHARMACY - ANTICOAGULATION CONSULT NOTE  Pharmacy Consult for Heparin  while Eliquis  on hold Indication: atrial fibrillation, LV thrombus  No Known Allergies  Patient Measurements: Height: 5' 9 (175.3 cm) Weight: 90.1 kg (198 lb 9.6 oz) IBW/kg (Calculated) : 70.7  Vital Signs: Temp: 97.9 F (36.6 C) (10/22 0918) Temp Source: Oral (10/22 0918) BP: 112/77 (10/22 0918) Pulse Rate: 62 (10/22 0918)  Labs: Recent Labs    07/11/24 1300 07/12/24 0317 07/13/24 0340 07/13/24 1408  HGB 10.1*  --   --   --   HCT 35.1*  --   --   --   PLT 191  --   --   --   APTT  --   --  43* 46*  HEPARINUNFRC  --   --  0.32 0.31  CREATININE 1.91* 1.91* 1.65*  --   TROPONINIHS 56*  --   --   --     Estimated Creatinine Clearance: 43.6 mL/min (A) (by C-G formula based on SCr of 1.65 mg/dL (H)).  Assessment: 73 yo male on chronic Eliquis  for hx Afib and LV thrombus now with plans for CRT-D. Pharmacy consulted to transition to IV heparin  in anticipation of procedure. Last dose of Eliquis  was 10/20 at 09:30.  aPTT still below goal, no issues with infusion or s/sx bleeding reported earlier today.  Goal of Therapy:  Heparin  level 0.3-0.7 units/ml aPTT 66-102 seconds Monitor platelets by anticoagulation protocol: Yes   Plan:  Increase heparin  infusion to 1400 units/hr Check heparin  level in 8 hours and daily while on heparin  Continue to monitor H&H and platelets Heparin  infusion off at 00:00 for CRT-D placement  Thank you for allowing pharmacy to be a part of this patient's care.  Shelba Collier, PharmD, BCPS Clinical Pharmacist

## 2024-07-13 NOTE — Progress Notes (Signed)
 CCMD notified RN that patient HR is trended down to the 20s but is currently mid 40s. RN rounded and patient is asleep, appears in no distress. Patient able to arousable with voice, states he feels fine. Current HR is 55-57s. EP team made aware. Continue to monitor for any symptoms. Pads may be placed per nursing discretion.

## 2024-07-13 NOTE — Progress Notes (Signed)
   07/13/24 0950  Spiritual Encounters  Type of Visit Initial  Care provided to: Patient  Reason for visit Advance directives   Chaplain responded to page for AD. Pt Juliane said he already had an AD but would like to update it and would like to have a DNR on file. Provided education and left paperwork with Juliane to complete

## 2024-07-13 NOTE — Progress Notes (Signed)
 This nurse in room to give morning meds patient did not want to take meds with nurse in the room patient educated on the need to take meds and the safety reasons why the nurse needed to be in the room to observe administration. Patient became cantankerous and said I'm not taking them until I finish eating. This nurse notified charge

## 2024-07-13 NOTE — Plan of Care (Signed)
  Problem: Clinical Measurements: Goal: Will remain free from infection Outcome: Progressing Goal: Diagnostic test results will improve Outcome: Progressing   Problem: Activity: Goal: Risk for activity intolerance will decrease Outcome: Progressing   Problem: Nutrition: Goal: Adequate nutrition will be maintained Outcome: Progressing

## 2024-07-14 ENCOUNTER — Inpatient Hospital Stay (HOSPITAL_COMMUNITY)
Admission: EM | Disposition: A | Discharge status: Home / Self Care | Payer: Self-pay | Source: Home / Self Care | Attending: Cardiovascular Disease

## 2024-07-14 DIAGNOSIS — R001 Bradycardia, unspecified: Secondary | ICD-10-CM | POA: Diagnosis not present

## 2024-07-14 DIAGNOSIS — I429 Cardiomyopathy, unspecified: Secondary | ICD-10-CM

## 2024-07-14 DIAGNOSIS — I5022 Chronic systolic (congestive) heart failure: Secondary | ICD-10-CM

## 2024-07-14 HISTORY — PX: BIV ICD INSERTION CRT-D: EP1195

## 2024-07-14 LAB — GLUCOSE, CAPILLARY
Glucose-Capillary: 158 mg/dL — ABNORMAL HIGH (ref 70–99)
Glucose-Capillary: 134 mg/dL — ABNORMAL HIGH (ref 70–99)
Glucose-Capillary: 118 mg/dL — ABNORMAL HIGH (ref 70–99)
Glucose-Capillary: 193 mg/dL — ABNORMAL HIGH (ref 70–99)

## 2024-07-14 LAB — CBC
HCT: 32 % — ABNORMAL LOW (ref 39.0–52.0)
Hemoglobin: 9.4 g/dL — ABNORMAL LOW (ref 13.0–17.0)
MCH: 23.3 pg — ABNORMAL LOW (ref 26.0–34.0)
MCHC: 29.4 g/dL — ABNORMAL LOW (ref 30.0–36.0)
MCV: 79.4 fL — ABNORMAL LOW (ref 80.0–100.0)
Platelets: 180 K/uL (ref 150–400)
RBC: 4.03 MIL/uL — ABNORMAL LOW (ref 4.22–5.81)
RDW: 18.9 % — ABNORMAL HIGH (ref 11.5–15.5)
WBC: 7.9 K/uL (ref 4.0–10.5)
nRBC: 0 % (ref 0.0–0.2)

## 2024-07-14 LAB — BASIC METABOLIC PANEL WITH GFR
Anion gap: 12 (ref 5–15)
BUN: 22 mg/dL (ref 8–23)
CO2: 20 mmol/L — ABNORMAL LOW (ref 22–32)
Calcium: 9.1 mg/dL (ref 8.9–10.3)
Chloride: 105 mmol/L (ref 98–111)
Creatinine, Ser: 1.57 mg/dL — ABNORMAL HIGH (ref 0.61–1.24)
GFR, Estimated: 46 mL/min — ABNORMAL LOW (ref >60)
Glucose, Bld: 138 mg/dL — ABNORMAL HIGH (ref 70–99)
Potassium: 4.2 mmol/L (ref 3.5–5.1)
Sodium: 137 mmol/L (ref 135–145)

## 2024-07-14 SURGERY — BIV ICD INSERTION CRT-D

## 2024-07-14 MED ORDER — MIDAZOLAM HCL 5 MG/5ML IJ SOLN
INTRAMUSCULAR | Status: DC | PRN
  Administered 2024-07-14: .5 mg via INTRAVENOUS

## 2024-07-14 MED ORDER — MIDAZOLAM HCL 2 MG/2ML IJ SOLN
INTRAMUSCULAR | Status: AC
  Filled 2024-07-14: qty 2

## 2024-07-14 MED ORDER — HEPARIN (PORCINE) IN NACL 1000-0.9 UT/500ML-% IV SOLN
INTRAVENOUS | Status: DC | PRN
  Administered 2024-07-14: 500 mL

## 2024-07-14 MED ORDER — LIDOCAINE HCL (PF) 1 % IJ SOLN
INTRAMUSCULAR | Status: DC | PRN
  Administered 2024-07-14: 50 mL

## 2024-07-14 MED ORDER — LIDOCAINE HCL (PF) 1 % IJ SOLN
INTRAMUSCULAR | Status: AC
  Filled 2024-07-14: qty 60

## 2024-07-14 MED ORDER — FENTANYL CITRATE (PF) 100 MCG/2ML IJ SOLN
INTRAMUSCULAR | Status: DC | PRN
  Administered 2024-07-14: 12.5 ug via INTRAVENOUS

## 2024-07-14 MED ORDER — FENTANYL CITRATE (PF) 100 MCG/2ML IJ SOLN
INTRAMUSCULAR | Status: AC
  Filled 2024-07-14: qty 2

## 2024-07-14 MED ORDER — SODIUM CHLORIDE 0.9 % IV SOLN
INTRAVENOUS | Status: AC
  Administered 2024-07-14: 80 mg
  Filled 2024-07-14: qty 2

## 2024-07-14 MED ORDER — IODIXANOL 320 MG/ML IV SOLN
INTRAVENOUS | Status: DC | PRN
  Administered 2024-07-14: 15 mL

## 2024-07-14 MED ORDER — CEFAZOLIN SODIUM-DEXTROSE 2-4 GM/100ML-% IV SOLN
INTRAVENOUS | Status: AC
  Administered 2024-07-14: 2 g via INTRAVENOUS
  Filled 2024-07-14: qty 100

## 2024-07-14 SURGICAL SUPPLY — 18 items
CABLE SURGICAL S-101-97-12 (CABLE) ×1 IMPLANT
CATH ATTAIN COM SURV 6250V-EH (CATHETERS) IMPLANT
CATH BALLOON 6FR 90 2LUMEN (CATHETERS) IMPLANT
DEFIB RESONATE HF CRT-D G547 (ICD Generator) IMPLANT
KIT ESSENTIALS PG (KITS) IMPLANT
KIT INSTRUMENT PACEMAKER INSER (INSTRUMENTS) IMPLANT
LEAD ACUITY X4 4671 (Lead) IMPLANT
LEAD INGEVITY 7841 52 (Lead) IMPLANT
LEAD RELIANCE 0673 IMPLANT
PAD DEFIB RADIO PHYSIO CONN (PAD) ×1 IMPLANT
SHEATH 7FR PRELUDE SNAP 13 (SHEATH) IMPLANT
SHEATH 8FR PRELUDE SNAP 13 (SHEATH) IMPLANT
SHEATH 9.5FR PRELUDE SNAP 13 (SHEATH) IMPLANT
SHEATH PROBE COVER 6X72 (BAG) IMPLANT
SLITTER 6232ADJ (MISCELLANEOUS) IMPLANT
TRAY PACEMAKER INSERTION (PACKS) ×1 IMPLANT
WIRE ACUITY WHISPER EDS 4648 (WIRE) IMPLANT
WIRE HI TORQ VERSACORE-J 145CM (WIRE) IMPLANT

## 2024-07-14 NOTE — Progress Notes (Signed)
 Pt back on floor from Cath Lab. Report received from [place], [place]. New ICD placed. Dressing clean, dry and intact. Pt is alert and oriented on arrival. He is in no apparent distress. RN released postop orders and encouraged pt to order dinner. Call light in reach.

## 2024-07-14 NOTE — Progress Notes (Signed)
 Rounding Note   Patient Name: Mark Meadows Date of Encounter: 07/14/2024  [facility] Cardiologist: Ezra Shuck, MD   Subjective  No CP, SOB, glad to be getting going with implant today  Scheduled Meds:  allopurinol  200 mg Oral Daily   Chlorhexidine  Gluconate Cloth  6 each Topical Daily   clopidogrel   75 mg Oral Q breakfast   gentamicin (GARAMYCIN) 80 mg in sodium chloride  0.9 % 500 mL irrigation  80 mg Irrigation On Call   insulin  aspart  0-9 Units Subcutaneous TID WC   levothyroxine   150 mcg Oral Once per day on Monday Tuesday Thursday Friday Saturday   And   levothyroxine   300 mcg Oral Once per day on Sunday Wednesday   mupirocin ointment  1 Application Nasal BID   pantoprazole  40 mg Oral Daily   rosuvastatin  40 mg Oral Daily   sodium chloride flush  3 mL Intravenous Q12H   spironolactone  25 mg Oral Daily   tamsulosin  0.4 mg Oral Daily   Continuous Infusions:  sodium chloride 50 mL/hr at 07/14/24 0546    ceFAZolin (ANCEF) IV     PRN Meds: acetaminophen, nitroGLYCERIN, sodium chloride flush   Vital Signs  Vitals:   07/13/24 2300 07/14/24 0317 07/14/24 0800 07/14/24 1018  BP: 120/61 104/65 108/63 127/82  Pulse: (!) 50 (!) 43 (!) 45 67  Resp: 20 17 15 18  Temp: 98.2 F (36.8 C) 98.7 F (37.1 C)  (!) 97.4 F (36.3 C)  TempSrc: Oral Oral  Oral  SpO2: 98% 100% 98% 100%  Weight:      Height:        Intake/Output Summary (Last 24 hours) at 07/14/2024 1029 Last data filed at 07/14/2024 1004 Gross per 24 hour  Intake 836.49 ml  Output 1700 ml  Net -863.51 ml      10 /21/2025    6:00 PM 07/06/2024    4:30 AM 07/05/2024    9:04 AM  Last 3 Weights  Weight (lbs) 198 lb 9.6 oz 200 lb 4.8 oz 207 lb  Weight (kg) 90.084 kg 90.855 kg 93.895 kg      Telemetry  unchanged AFib 30's- 60's in/out BBB morphology with a more narrow (though perhaps icRBBB) - Personally Reviewed  ECG   No new EKGs - Personally Reviewed  Physical Exam  Remains  unchanged GEN: No acute distress.   Neck: No JVD Cardiac: irreg-irreg, no murmurs, rubs, or gallops.  Respiratory: CTA b/l. GI: Soft, nontender, non-distended  MS: No edema; No deformity. Neuro:  Nonfocal  Psych: Normal affect   Labs High Sensitivity Troponin:   Recent Labs  Lab 07/11/24 1300  TROPONINIHS 56*     Chemistry Recent Labs  Lab 07/12/24 0317 07/13/24 0340 07/14/24 0303  NA 137 136 137  K 3.6 4.3 4.2  CL 106 109 105  CO2 21* 18* 20*  GLUCOSE 170* 135* 138*  BUN 24* 24* 22  CREATININE 1.91* 1.65* 1.57*  CALCIUM  8.9 9.1 9.1  GFRNONAA 36* 43* 46*  ANIONGAP 10 9 12     Lipids No results for input(s): CHOL, TRIG, HDL, LABVLDL, LDLCALC, CHOLHDL in the last 168 hours.  Hematology Recent Labs  Lab 07/11/24 1300 07/14/24 0303  WBC 8.7 7.9  RBC 4.32 4.03*  HGB 10.1* 9.4*  HCT 35.1* 32.0*  MCV 81.3 79.4*  MCH 23.4* 23.3*  MCHC 28.8* 29.4*  RDW 18.6* 18.9*  PLT 191 180   Thyroid  Recent Labs  Lab 07/11/24 1300  TSH 14.402*    BNP Recent Labs  Lab 07/11/24 1300  BNP 292.8*    DDimer No results for input(s): DDIMER in the last 168 hours.   Radiology  No results found.   Cardiac Studies  07/06/24: TTE  1. Akinesis of the distal septum and apex; overall severe LV dysfunction;  s/p TAVR with mean gradient 7 mmHg and no AI.   2. Left ventricular ejection fraction, by estimation, is 25 to 30%. The  left ventricle has severely decreased function. The left ventricle  demonstrates regional wall motion abnormalities (see scoring  diagram/findings for description). Left ventricular  diastolic parameters are indeterminate.   3. Right ventricular systolic function is low normal. The right  ventricular size is mildly enlarged. There is moderately elevated  pulmonary artery systolic pressure. The estimated right ventricular  systolic pressure is 50.0 mmHg.   4. Left atrial size was moderately dilated.   5. Right atrial size was mildly  dilated.   6. There is no evidence of cardiac tamponade.   7. The mitral valve is degenerative. Mild mitral valve regurgitation. No  evidence of mitral stenosis.   8. The aortic valve has been repaired/replaced. Aortic valve  regurgitation is not visualized. No aortic stenosis is present. There is a  29 mm Edwards Sapien prosthetic (TAVR) valve present in the aortic  position. Procedure Date: 07/05/2024. Echo  findings are consistent with normal structure and function of the aortic  valve prosthesis.   9. Aortic dilatation noted. There is mild dilatation of the ascending  aorta, measuring 40 mm.      04/27/24: c.MRI IMPRESSION: 1.  No LV thrombus seen   2. Subendocardial LGE in mid to apical anterior/anteroseptal walls, apical inferior wall, and apex. This is consistent with prior LAD territory infarct. LGE is greater than 50% transmural suggesting region is nonviable   3.  Mild LV dilatation with moderate systolic dysfunction (EF 37%)   4.  Normal RV size with mild systolic dysfunction (EF 45%)   5.  Moderate mitral regurgitation (regurgitant fraction 27%)   6.  Dilated main pulmonary artery measuring 34mm   7.  Dilated ascending aorta measuring 40mm     02/16/24: TTE 1. Akinesis of the mid septum into apex and inferior septum consistent  with LAD infarction. LVEF 15-20%. Cannot exclude a layered LV thrombus on  contrast imaging. Consider cardiac MRI for definitive evaluation. LVOT VTI  9 cm. CO estimated 1.8 L/min and  CI 0.8 L/min/m2, concerning for low output state. Left ventricular  ejection fraction, by estimation, is 15-20%. The left ventricle has  severely decreased function. The left ventricle demonstrates regional wall  motion abnormalities (see scoring  diagram/findings for description). There is moderate concentric left  ventricular hypertrophy. Indeterminate diastolic filling due to E-A  fusion.   2. Severely calcified aortic valve with restricted leaflet  movement. Vmax  2.6 m/s, MG 16 mmHG, AVA 0.81 cm2, DI 0.24. This is concerning for low  flow low gradient severe aortic stenosis. Recommend clinical correlation.  The aortic valve is calcified.  Aortic valve regurgitation is trivial. Moderate to severe aortic valve  stenosis.   3. Right ventricular systolic function is normal. The right ventricular  size is mildly enlarged. Tricuspid regurgitation signal is inadequate for  assessing PA pressure.   4. The mitral valve is degenerative. Mild mitral valve regurgitation. No  evidence of mitral stenosis.   5. The inferior vena cava is dilated in size with >50% respiratory  variability, suggesting right  atrial pressure of 8 mmHg.        02/15/24: LHC/PCI   Prox LAD to Mid LAD lesion is 100% stenosed.   A drug-eluting stent was successfully placed using a STENT SYNERGY XD 3.0X16.   Post intervention, there is a 0% residual stenosis.   LV end diastolic pressure is severely elevated.   The left ventricular ejection fraction is 25-35% by visual estimate.   Recommend uninterrupted dual antiplatelet therapy with Aspirin  81mg  daily and Clopidogrel  75mg  daily for a minimum of 12 months (ACS-Class I recommendation).   1.  Acute, late presenting STEMI involving the LAD, treated successfully with PCI using PTCA followed by stenting of the proximal LAD with baseline TIMI 0 flow, restored to TIMI-3 flow at the completion of the procedure 2.  Mild nonobstructive plaquing in the left main, left circumflex, and RCA with a heavily calcified RCA 3.  Severe segmental LV dysfunction with akinesis of the entire anterolateral wall, apex, and inferoapex, LVEF estimated at approximately 25 to 30% 4.  Angiographic findings on ventriculography suspicious for LV apical thrombus 5.  Severely elevated LVEDP   Recommendations: DAPT with aspirin  and clopidogrel  minimum of 12 months, check 2D echo with contrast to evaluate for LV apical thrombus and accurately assess LVEF,  post MI medical therapy, IV diuresis with furosemide  in the setting of high LVEDP (acute systolic heart failure secondary to acute anterior infarct).  Institute GDMT as tolerated.  Patient Profile   73 y.o. 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 ICM  Admitted with progressive fatigue, DOE > symptomatic bradycardai  Assessment & Plan   Persistent AFib/AFlutter (atypical) with slow ventricular response CHA2DS2Vasc is 5, on Eliquis  last dose was this morning (10/20 ~ 7am) Will resume post implant, pending pocket stability   Symptomatic bradycardia He has evidence of alternating bundle on zio tracings He reports typically up/about/awake between 0300-0500 with later morning nap --- Last dose of dig 07/08/24 (dig level <0.6) --- Chronic amiodarone  > held here --- No other nodal blocking agents     PLAN: He will need CRT-D Scheduled for today Post device >> advance GDMT with his outpt team as able Discussed rational for pacing/ICD and CRT with the patient Discussed procedure, potential risks/benefits Revisited today, he had no follow up questions and remains agreeable to proceed    ICM No symptoms or exam findings of volume OL LVEF post TAVR 25-30% LBBB Home meds     CAD Never had any CP, but has not had the oppressive SOB like he did with his STEMI No symptoms of his CAD Will continue his meds, plavix  uninterrupted C/w McLean/team outpt   VHD S/p TAVR 10/14 C/w Dr. McAlhany/team outpt   HTN Not on any BP meds home   DM Home Farxiga  > held for procedure SSI    For questions or updates, please contact [facility] Please consult [email or web address] for contact info under   Signed, Charlies Macario Arthur, PA-C  07/14/2024, 10:29 AM

## 2024-07-14 NOTE — Progress Notes (Signed)
 Went in to get 2nd hibiclens  bath completed; pt would like to wait later in the AM to complete.

## 2024-07-14 NOTE — Plan of Care (Signed)
  Problem: Education: Goal: Knowledge of General Education information will improve Description: Including pain rating scale, medication(s)/side effects and non-pharmacologic comfort measures Outcome: Progressing   Problem: Health Behavior/Discharge Planning: Goal: Ability to manage health-related needs will improve Outcome: Progressing   Problem: Clinical Measurements: Goal: Ability to maintain clinical measurements within normal limits will improve Outcome: Progressing Goal: Will remain free from infection Outcome: Progressing Goal: Diagnostic test results will improve Outcome: Progressing Goal: Respiratory complications will improve Outcome: Progressing Goal: Cardiovascular complication will be avoided Outcome: Progressing   Problem: Activity: Goal: Risk for activity intolerance will decrease Outcome: Progressing   Problem: Nutrition: Goal: Adequate nutrition will be maintained Outcome: Progressing   Problem: Coping: Goal: Level of anxiety will decrease Outcome: Progressing   Problem: Elimination: Goal: Will not experience complications related to bowel motility Outcome: Progressing Goal: Will not experience complications related to urinary retention Outcome: Progressing   Problem: Pain Managment: Goal: General experience of comfort will improve and/or be controlled Outcome: Progressing   Problem: Safety: Goal: Ability to remain free from injury will improve Outcome: Progressing   Problem: Skin Integrity: Goal: Risk for impaired skin integrity will decrease Outcome: Progressing   Problem: Education: Goal: Knowledge of cardiac device and self-care will improve Outcome: Progressing Goal: Ability to safely manage health related needs after discharge will improve Outcome: Progressing Goal: Individualized Educational Video(s) Outcome: Progressing   Problem: Cardiac: Goal: Ability to achieve and maintain adequate cardiopulmonary perfusion will  improve Outcome: Progressing   Problem: Education: Goal: Ability to describe self-care measures that may prevent or decrease complications (Diabetes Survival Skills Education) will improve Outcome: Progressing Goal: Individualized Educational Video(s) Outcome: Progressing   Problem: Coping: Goal: Ability to adjust to condition or change in health will improve Outcome: Progressing   Problem: Fluid Volume: Goal: Ability to maintain a balanced intake and output will improve Outcome: Progressing   Problem: Health Behavior/Discharge Planning: Goal: Ability to identify and utilize available resources and services will improve Outcome: Progressing Goal: Ability to manage health-related needs will improve Outcome: Progressing   Problem: Metabolic: Goal: Ability to maintain appropriate glucose levels will improve Outcome: Progressing   Problem: Nutritional: Goal: Maintenance of adequate nutrition will improve Outcome: Progressing Goal: Progress toward achieving an optimal weight will improve Outcome: Progressing   Problem: Skin Integrity: Goal: Risk for impaired skin integrity will decrease Outcome: Progressing   Problem: Tissue Perfusion: Goal: Adequacy of tissue perfusion will improve Outcome: Progressing

## 2024-07-15 ENCOUNTER — Inpatient Hospital Stay (HOSPITAL_COMMUNITY)

## 2024-07-15 ENCOUNTER — Encounter (HOSPITAL_COMMUNITY): Payer: Self-pay | Admitting: Cardiology

## 2024-07-15 LAB — BASIC METABOLIC PANEL WITH GFR
Anion gap: 11 (ref 5–15)
BUN: 18 mg/dL (ref 8–23)
CO2: 21 mmol/L — ABNORMAL LOW (ref 22–32)
Calcium: 8.7 mg/dL — ABNORMAL LOW (ref 8.9–10.3)
Chloride: 104 mmol/L (ref 98–111)
Creatinine, Ser: 1.66 mg/dL — ABNORMAL HIGH (ref 0.61–1.24)
GFR, Estimated: 43 mL/min — ABNORMAL LOW (ref >60)
Glucose, Bld: 149 mg/dL — ABNORMAL HIGH (ref 70–99)
Potassium: 4 mmol/L (ref 3.5–5.1)
Sodium: 136 mmol/L (ref 135–145)

## 2024-07-15 LAB — CBC
HCT: 31.4 % — ABNORMAL LOW (ref 39.0–52.0)
Hemoglobin: 9.3 g/dL — ABNORMAL LOW (ref 13.0–17.0)
MCH: 23.6 pg — ABNORMAL LOW (ref 26.0–34.0)
MCHC: 29.6 g/dL — ABNORMAL LOW (ref 30.0–36.0)
MCV: 79.7 fL — ABNORMAL LOW (ref 80.0–100.0)
Platelets: 171 K/uL (ref 150–400)
RBC: 3.94 MIL/uL — ABNORMAL LOW (ref 4.22–5.81)
RDW: 18.8 % — ABNORMAL HIGH (ref 11.5–15.5)
WBC: 6.6 K/uL (ref 4.0–10.5)
nRBC: 0 % (ref 0.0–0.2)

## 2024-07-15 LAB — GLUCOSE, CAPILLARY: Glucose-Capillary: 153 mg/dL — ABNORMAL HIGH (ref 70–99)

## 2024-07-15 MED FILL — Midazolam HCl Inj 2 MG/2ML (Base Equivalent): INTRAMUSCULAR | Qty: 0.5 | Status: AC

## 2024-07-15 MED FILL — Midazolam HCl Inj 2 MG/2ML (Base Equivalent): INTRAMUSCULAR | Qty: 0.5 | Status: CN

## 2024-07-15 NOTE — Discharge Instructions (Addendum)
 After Your Pacemaker   You have a Environmental education officer  If you have a Medtronic or Biotronik device, plug in your home monitor once you get home, and no manual interaction is required.   If you have an [employer] or [employer] device, plug your home monitor once you get home, sit near the device, and press the large activation button. Sit nearby until the process is complete, usually notated by lights on the monitor.   If you were set up for monitoring using an app on your phone, make sure the app remains open in the background and the Bluetooth remains on.  ACTIVITY Do not lift your arm above shoulder height for 1 week after your procedure. After 7 days, you may progress as below.  You should remove your sling 24 hours after your procedure, unless otherwise instructed by your provider.     Friday July 22, 2024  Saturday July 23, 2024 Sunday July 24, 2024 Monday July 25, 2024   Do not lift, push, pull, or carry anything over 10 pounds with the affected arm until 6 weeks (Friday August 26, 2024 ) after your procedure.   You may drive AFTER your wound check, unless you have been told otherwise by your provider.   Ask your healthcare provider when you can go back to work   INCISION/Dressing If you are on a blood thinner such as Coumadin, Xarelto, Eliquis , Plavix , or Pradaxa please confirm with your provider when this should be resumed. 07/20/24  If large square, outer bandage is left in place, this can be removed after 24 hours from your procedure. Do not remove steri-strips or glue as below.   If a PRESSURE DRESSING (a bulky dressing that usually goes up over your shoulder) was applied or left in place, please follow instructions given by your provider on when to return to have this removed.   Monitor your Pacemaker site for redness, swelling, and drainage. Call the device clinic at [phone] if you experience these symptoms or fever/chills.  If your incision  is sealed with Steri-strips or staples, you may shower 7 days after your procedure or when told by your provider. Do not remove the steri-strips or let the shower hit directly on your site. You may wash around your site with soap and water.    If you were discharged in a sling, please do not wear this during the day more than 48 hours after your surgery unless otherwise instructed. This may increase the risk of stiffness and soreness in your shoulder.   Avoid lotions, ointments, or perfumes over your incision until it is well-healed.  You may use a hot tub or a pool AFTER your wound check appointment if the incision is completely closed.  Pacemaker Alerts:  Some alerts are vibratory and others beep. These are NOT emergencies. Please call our office to let us  know. If this occurs at night or on weekends, it can wait until the next business day. Send a remote transmission.  If your device is capable of reading fluid status (for heart failure), you will be offered monthly monitoring to review this with you.   DEVICE MANAGEMENT Remote monitoring is used to monitor your pacemaker from home. This monitoring is scheduled every 91 days by our office. It allows us  to keep an eye on the functioning of your device to ensure it is working properly. You will routinely see your Electrophysiologist annually (more often if necessary).  This will appear as a REMOTE check on  your MyChart schedule. These are automatic and there is nothing for you to manually do unless otherwise instructed.  You should receive your ID card for your new device in 4-8 weeks. Keep this card with you at all times once received. Consider wearing a medical alert bracelet or necklace.  Your Pacemaker may be MRI compatible. This will be discussed at your next office visit/wound check.  You should avoid contact with strong electric or magnetic fields.   Do not use amateur (ham) radio equipment or electric (arc) welding torches. MP3 player  headphones with magnets should not be used. Some devices are safe to use if held at least 12 inches (30 cm) from your Pacemaker. These include power tools, lawn mowers, and speakers. If you are unsure if something is safe to use, ask your health care provider.  When using your cell phone, hold it to the ear that is on the opposite side from the Pacemaker. Do not leave your cell phone in a pocket over the Pacemaker.  You may safely use electric blankets, heating pads, computers, and microwave ovens.  Call the office right away if: You have chest pain. You feel more short of breath than you have felt before. You feel more light-headed than you have felt before. Your incision starts to open up.  This information is not intended to replace advice given to you by your health care provider. Make sure you discuss any questions you have with your health care provider.

## 2024-07-15 NOTE — Discharge Summary (Signed)
 DISCHARGE SUMMARY    Patient ID: Mark Meadows,  MRN: [id number], DOB/AGE: 1951/02/04 73 y.o.  Admit date: 07/11/2024 Discharge date: 07/15/2024  Primary Care Physician: Haze Kingfisher, MD  Primary Cardiologist: Dr. Rolan Electrophysiologist: new > Dr. Cindie (Dr. 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  Implantation of a CRT-D on 07/14/24 by Dr Cindie.   DFT's were deferred at time of implant There were no immediate post procedure complications. 2.  CXR on 07/15/24 demonstrated no pneumothorax status post device implantation.   Brief HPI: Mark Meadows is a 73 y.o. male w/PMHx as above, underwent TAVR 07/05/24 post valve deployment developed new LBBB and discharged with Zio monitoring. Alerts for AFib w/SVR 30's and his digoxin  stopped  Subsequently continued alerts for AFib w/SVR 30's noted during awake hours as well as night/s early morning. Pt reported feeling weak, tired, difficulty getting to the mailbox and back Advised to go to the ER for EP to see, likely need for device, noting amiodarone  since June with discussion that once he was post TAVR/recovered > would look to try and establish SR again once procedures completed  Hospital Course:  The patient was admitted in review of Zio tracings was AFib w/SVR 30's-40's with evidence of alternating BBB, Slowest rates are nocturnal, though has brady with alternating BBB day time as well. Labs were unremarkable, he denied dizziness, syncope, but had noted worsening exertional capacity His amiodarone  held  With ICM, and nonviable territories on his MRI in August, recommended CRT-D.  Implant delayed given his Eliquis  and Plavix  as well as scheduling constraints.    He underwent ICD implant with details as  outlined in the procedure report. He was monitored on telemetry throughout his stay, AFib/flutter w/SVR > V paced.  Left chest was without hematoma or ecchymosis.  The device was interrogated and found to be functioning normally.  CXR was obtained and demonstrated no pneumothorax status post device implantation.  Wound care, arm mobility, and restrictions were reviewed with the patient.  The patient feels well, denies any CP or SOB, minimal site discomfort, he was examined by Dr. Almetta and considered stable for discharge to home.   In review of last AHF note: RAASi: holding until TAVR Beta-blocker: low output heart failure -            Hydralazine /Nitrates: N/A -            Spironolactone  12.5mg  daily  -Farxiga  10mg  daily  Will resume his home digoxin  Will hold off on amiodarone  given interruption in his [facility] He has AHF team f/u in place for 11/19 > could consider resumption then   Given his STEMI in May 2025, his Plavix  was continued uninterrupted  EP follow up is in place  [facility]: resume Eliquis  07/20/24  Physical Exam: Vitals:   07/14/24 1948 07/14/24 2356 07/15/24 0448 07/15/24 0824  BP: 112/72 101/71 106/71 106/71  Pulse: 66 64 70 70  Resp: 18 19 19 20   Temp: 98.8 F (37.1 C) 98.4 F (36.9 C) (!) 97.4 F (36.3 C) 98.2 F (36.8 C)  TempSrc: Oral Oral Oral Oral  SpO2: 97% 97% 98% 98%  Weight:      Height:        GEN- The patient is well appearing, alert and oriented x 3  today.   HEENT: normocephalic, atraumatic; sclera clear, conjunctiva pink; hearing intact; oropharynx clear Lungs-  CTA b/l, normal work of breathing.  No wheezes, rales, rhonchi Heart- RRR (paced), no murmurs, rubs or gallops, PMI not laterally displaced GI- soft, non-tender, non-distended Extremities- no clubbing, cyanosis, or edema MS- no significant deformity or atrophy Skin- warm and dry, no rash or lesion, left chest without hematoma/ecchymosis Psych- euthymic mood, full affect Neuro- no gross  defecits  Labs:   Lab Results  Component Value Date   WBC 6.6 07/15/2024   HGB 9.3 (L) 07/15/2024   HCT 31.4 (L) 07/15/2024   MCV 79.7 (L) 07/15/2024   PLT 171 07/15/2024    Recent Labs  Lab 07/15/24 0309  NA 136  K 4.0  CL 104  CO2 21*  BUN 18  CREATININE 1.66*  CALCIUM  8.7*  GLUCOSE 149*    Discharge Medications:  Allergies as of 07/15/2024   No Known Allergies      Medication List     STOP taking these medications    amiodarone  200 MG tablet Commonly known as: PACERONE        TAKE these medications    allopurinol 100 MG tablet Commonly known as: ZYLOPRIM Take 200 mg by mouth daily.   apixaban  5 MG Tabs tablet Commonly known as: ELIQUIS  Take 1 tablet (5 mg total) by mouth 2 (two) times daily. Notes to patient: Do not resume until 07/20/24   clopidogrel  75 MG tablet Commonly known as: PLAVIX  Take 1 tablet (75 mg total) by mouth daily with breakfast.   digoxin  0.125 MG tablet Commonly known as: LANOXIN  Take 1 tablet (0.125 mg total) by mouth daily. Notes to patient: OK to resume today   Farxiga  10 MG Tabs tablet Generic drug: dapagliflozin  propanediol Take 1 tablet (10 mg total) by mouth daily.   furosemide  40 MG tablet Commonly known as: Lasix  Take 1 tablet by mouth as needed for weight gain, lower extremity edema, increased shortness of breath   levothyroxine  150 MCG tablet Commonly known as: SYNTHROID  Take 150-300 mcg by mouth See admin instructions. Take 150mcg (1 tablet) by mouth 5 days a week, then take 300mcg (2 tablets) 2 days a week.   pantoprazole  40 MG tablet Commonly known as: PROTONIX  Take 1 tablet (40 mg total) by mouth daily.   rosuvastatin  40 MG tablet Commonly known as: CRESTOR  TAKE 1 TABLET BY MOUTH EVERY DAY   spironolactone  25 MG tablet Commonly known as: ALDACTONE  Take 1 tablet (25 mg total) by mouth daily.   tamsulosin  0.4 MG Caps capsule Commonly known as: FLOMAX  Take 0.4 mg by mouth.         Disposition:  Discharge Instructions     Diet - low sodium heart healthy   Complete by: As directed    Increase activity slowly   Complete by: As directed         Duration of Discharge Encounter: 15 minutes, APP time.  Bonney Charlies Arthur, PA-C 07/15/2024 10:39 AM

## 2024-07-15 NOTE — Progress Notes (Signed)
 Mobility Specialist Progress Note:    07/15/24 0926  Mobility  Activity Dangled on edge of bed  Level of Assistance Standby assist, set-up cues, supervision of patient - no hands on  Assistive Device None  LUE Weight Bearing Per Provider Order NWB  Activity Response Tolerated well  Mobility Referral Yes  Mobility visit 1 Mobility  Mobility Specialist Start Time (ACUTE ONLY) [id number]  Mobility Specialist Stop Time (ACUTE ONLY) 0932  Mobility Specialist Time Calculation (min) (ACUTE ONLY) 6 min   Pt received in bed, declined ambulation at this time, agreeable to dangle EOB. HR in 70s throughout session, asx throughout. Left with all needs met.   Rogue Rafalski Mobility Specialist Please contact via Special educational needs teacher or  Rehab office at [phone]

## 2024-07-15 NOTE — Progress Notes (Signed)
   07/15/24 1207  TOC Brief Assessment  Insurance and Status Reviewed  Patient has primary care physician Yes  Home environment has been reviewed home  Prior level of function: self/independent  Prior/Current Home Services No current home services  Social Drivers of Health Review SDOH reviewed no interventions necessary  Readmission risk has been reviewed Yes  Transition of care needs no transition of care needs at this time    Pt readmitted s/p TAVR for ICD/PPM placement- stable for transition home, no HH or DME needs noted.

## 2024-07-15 NOTE — Care Management Important Message (Signed)
 Important Message  Patient Details  Name: Mark Meadows MRN: [id number] Date of Birth: [birth year]   Important Message Given:  Yes - Medicare IM     [name] 07/15/2024, 12:27 PM

## 2024-07-18 ENCOUNTER — Telehealth: Payer: Self-pay

## 2024-07-18 ENCOUNTER — Encounter: Payer: Self-pay | Admitting: Emergency Medicine

## 2024-07-18 NOTE — Telephone Encounter (Signed)
 Follow-up after same day discharge: Implant date: 07/14/2024 MD: lambert Device: icd bsx  Location: L chest    Wound check visit: 07/26/2024 90 day MD follow-up: 10/13/2024  Remote Transmission received:yes  Dressing/sling removed: yes  Confirm OAC restart on: yes   Please continue to monitor your cardiac device site for redness, swelling, and drainage. Call the device clinic at [phone] if you experience these symptoms, fever/chills, or have questions about your device.   Remote monitoring is used to monitor your cardiac device from home. This monitoring is scheduled every 91 days by our office. It allows us  to keep an eye on the functioning of your device to ensure it is working properly.

## 2024-07-19 ENCOUNTER — Other Ambulatory Visit (HOSPITAL_COMMUNITY): Payer: Self-pay

## 2024-07-26 ENCOUNTER — Ambulatory Visit: Attending: Cardiology

## 2024-07-26 DIAGNOSIS — I447 Left bundle-branch block, unspecified: Secondary | ICD-10-CM

## 2024-07-26 DIAGNOSIS — I5022 Chronic systolic (congestive) heart failure: Secondary | ICD-10-CM

## 2024-07-26 LAB — CUP PACEART INCLINIC DEVICE CHECK
Date Time Interrogation Session: 20251104113803
HighPow Impedance: 70 Ohm
Implantable Lead Connection Status: 753985
Implantable Lead Connection Status: 753985
Implantable Lead Connection Status: 753985
Implantable Lead Implant Date: 20251023
Implantable Lead Implant Date: 20251023
Implantable Lead Implant Date: 20251023
Implantable Lead Location: 753858
Implantable Lead Location: 753859
Implantable Lead Location: 753860
Implantable Lead Model: 4671
Implantable Lead Model: 673
Implantable Lead Model: 7841
Implantable Lead Serial Number: 1671126
Implantable Lead Serial Number: 281788
Implantable Lead Serial Number: 901945
Implantable Pulse Generator Implant Date: 20251023
Lead Channel Impedance Value: 480 Ohm
Lead Channel Impedance Value: 562 Ohm
Lead Channel Impedance Value: 789 Ohm
Lead Channel Pacing Threshold Amplitude: 0.4 V
Lead Channel Pacing Threshold Amplitude: 2.3 V
Lead Channel Pacing Threshold Pulse Width: 0.4 ms
Lead Channel Pacing Threshold Pulse Width: 0.8 ms
Lead Channel Sensing Intrinsic Amplitude: 12.5 mV
Lead Channel Sensing Intrinsic Amplitude: 19.6 mV
Lead Channel Sensing Intrinsic Amplitude: 5.2 mV
Lead Channel Setting Pacing Amplitude: 3.5 V
Lead Channel Setting Pacing Amplitude: 3.5 V
Lead Channel Setting Pacing Amplitude: 3.5 V
Lead Channel Setting Pacing Pulse Width: 0.4 ms
Lead Channel Setting Pacing Pulse Width: 0.8 ms
Lead Channel Setting Sensing Sensitivity: 0.5 mV
Lead Channel Setting Sensing Sensitivity: 1 mV
Pulse Gen Serial Number: 362887
Zone Setting Status: 755011

## 2024-07-26 NOTE — Patient Instructions (Signed)

## 2024-07-26 NOTE — Progress Notes (Signed)
 Normal bi-ventricular ICD wound check. Wound well healed. Presenting rhythm: AF/BP- 70. Routine testing performed. RA-RV Thresholds, sensing, and impedance consistent with implant measurements. Increase in LV threshold, see below for programming. No treated arrhythmias. Intermittent AF noted since implant- longest episode > 48 hours. Reviewed arm restrictions to continue for 6 weeks total post op. Reviewed shock plan.  Pt enrolled in remote follow-up.   Testing completed:  > 3.5 V at 0.4 ms 2.3 V at 0.8 ms 2.1 V at 1.0 ms   LV pulse width programmed at 0.8 ms (maintains 1 V safety margin)

## 2024-08-01 ENCOUNTER — Telehealth: Payer: Self-pay

## 2024-08-01 DIAGNOSIS — I4891 Unspecified atrial fibrillation: Secondary | ICD-10-CM

## 2024-08-01 NOTE — Telephone Encounter (Addendum)
 CRT-D new implant: 07/14/24 LV lead impedance trend increase at 14 day in clinic post op visit.  PW extended to 0.20ms for 1x SM.   Alert remote transmission: Cardiac Resynchronization Therapy pacing of < 85%. Pacing was 79% between Jul 30, 2024 03:00 and Jul 31, 2024 03:00.  Hx of recurrent alerts for Atrial Arrhythmia Burden of at least 24.0 hours in a 24 hour period.  Presents in AF. Known persistent AF since 10/24.  On OAC per EMR med list.    BIV pacing 77%. Since 11/4.  Currently 89% with recent decrease via trending.  AF alert suspended x 1 month and will monitor plan of care.  BIV pacing alert adjusted from <85% to <80% to alert for changes in BIV pacing.    Recent changes in LV lead impedance.  Reviewed trends and EGMs with Joey (BSX rep).  Patient appears to have appropriate LV-RV capture.  Decline in BVP % more likely due to ongoing AF event.  Will continue to monitor.    Patient has 3 month follow up in January.  Will monitor trends and address sooner if needed. I have set a reminder to re-assess patient's BVP/AF burden in 1 week.

## 2024-08-05 ENCOUNTER — Other Ambulatory Visit (HOSPITAL_COMMUNITY)

## 2024-08-05 ENCOUNTER — Other Ambulatory Visit (HOSPITAL_COMMUNITY): Payer: Self-pay

## 2024-08-05 ENCOUNTER — Other Ambulatory Visit: Payer: Self-pay

## 2024-08-09 ENCOUNTER — Ambulatory Visit (HOSPITAL_COMMUNITY)
Admission: RE | Admit: 2024-08-09 | Discharge: 2024-08-09 | Disposition: A | Discharge status: Home / Self Care | Source: Ambulatory Visit | Attending: Internal Medicine | Admitting: Internal Medicine

## 2024-08-09 ENCOUNTER — Other Ambulatory Visit (HOSPITAL_COMMUNITY): Payer: Self-pay

## 2024-08-09 ENCOUNTER — Telehealth (HOSPITAL_COMMUNITY): Payer: Self-pay | Admitting: Cardiology

## 2024-08-09 DIAGNOSIS — Z952 Presence of prosthetic heart valve: Secondary | ICD-10-CM | POA: Insufficient documentation

## 2024-08-09 LAB — ECHOCARDIOGRAM COMPLETE
AR max vel: 2.55 cm2
AV Area VTI: 2.4 cm2
AV Area mean vel: 2.35 cm2
AV Mean grad: 7 mmHg
AV Peak grad: 12.8 mmHg
Ao pk vel: 1.79 m/s
Area-P 1/2: 3.42 cm2
Calc EF: 28.8 %
MV VTI: 2.83 cm2
S' Lateral: 4.28 cm
Single Plane A2C EF: 25.7 %
Single Plane A4C EF: 30.1 %

## 2024-08-09 MED ORDER — PERFLUTREN LIPID MICROSPHERE
1.0000 mL | INTRAVENOUS | Status: AC | PRN
  Administered 2024-08-09: 2 mL via INTRAVENOUS

## 2024-08-09 NOTE — Telephone Encounter (Signed)
 Called to confirm/remind patient of their appointment at the [facility] on 08/09/24.   Appointment:   [] Confirmed  [x] Left mess   [] No answer/No voice mail  [] VM Full/unable to leave message  [] Phone not in service  Patient reminded to bring all medications and/or complete list.  Confirmed patient has transportation. Gave directions, instructed to utilize valet parking.

## 2024-08-09 NOTE — Addendum Note (Signed)
 Addended by: GERSHON PALMA C on: 08/09/2024 11:04 AM   Modules accepted: Orders

## 2024-08-09 NOTE — Telephone Encounter (Addendum)
 Re-evaluated AF burden and BVP pacing percentages:   Patient appears to have been out of AF since 07/31/24. (Ongoing event 10/24-11/9).  BVP percentage has rebounded from 60-70% at lowest to now 80-90%.   Will continue to monitor BVP percentage.   Patient is doing well; however, states when his prescription runs out of Eliquis  he may have to stop it because he can't afford it.  He and I discussed the risk for CVA given his AF and need for [facility] protection.  Patient verbalizes understanding and agrees to review with pharmacy any options for support or change in a more affordable OAC.   He currently uses [facility]. Referred to our pharmacy team to reach out to patient to discuss options/supports.

## 2024-08-09 NOTE — Telephone Encounter (Signed)
 Reviewed with pharmacy.  They recommend referral to our Social Worker to see if he qualifies for any financial support with his medications.  He currently has a 49 dollar copay with Eliquies.  Per Chris Povero, Pharm, does not appear they can offer much better than that with OAC's.    Patient says he only makes 1800 per month and with other bills, cannot afford to pay 50 bucks monthly for several of his medications a piece (ie: Farxiga  and Eliquis ).    He is a major stroke risk r/t his Afib and needs to be on an OAC for safety.     He is aware someone will reach out to him to review if any further supports can be offered with his medications.

## 2024-08-10 ENCOUNTER — Other Ambulatory Visit (HOSPITAL_COMMUNITY): Payer: Self-pay

## 2024-08-10 ENCOUNTER — Ambulatory Visit (HOSPITAL_COMMUNITY)
Admit: 2024-08-10 | Discharge: 2024-08-10 | Disposition: A | Discharge status: Home / Self Care | Attending: Cardiology | Admitting: Cardiology

## 2024-08-10 ENCOUNTER — Ambulatory Visit: Payer: Self-pay | Admitting: Physician Assistant

## 2024-08-10 VITALS — BP 115/82 | HR 62 | Wt 202.0 lb

## 2024-08-10 DIAGNOSIS — I2510 Atherosclerotic heart disease of native coronary artery without angina pectoris: Secondary | ICD-10-CM | POA: Insufficient documentation

## 2024-08-10 DIAGNOSIS — Z86718 Personal history of other venous thrombosis and embolism: Secondary | ICD-10-CM | POA: Diagnosis not present

## 2024-08-10 DIAGNOSIS — M109 Gout, unspecified: Secondary | ICD-10-CM | POA: Insufficient documentation

## 2024-08-10 DIAGNOSIS — Z7984 Long term (current) use of oral hypoglycemic drugs: Secondary | ICD-10-CM | POA: Diagnosis not present

## 2024-08-10 DIAGNOSIS — Z91148 Patient's other noncompliance with medication regimen for other reason: Secondary | ICD-10-CM | POA: Insufficient documentation

## 2024-08-10 DIAGNOSIS — I252 Old myocardial infarction: Secondary | ICD-10-CM | POA: Diagnosis not present

## 2024-08-10 DIAGNOSIS — I350 Nonrheumatic aortic (valve) stenosis: Secondary | ICD-10-CM | POA: Insufficient documentation

## 2024-08-10 DIAGNOSIS — Z95810 Presence of automatic (implantable) cardiac defibrillator: Secondary | ICD-10-CM | POA: Diagnosis not present

## 2024-08-10 DIAGNOSIS — Z7901 Long term (current) use of anticoagulants: Secondary | ICD-10-CM | POA: Diagnosis not present

## 2024-08-10 DIAGNOSIS — I4819 Other persistent atrial fibrillation: Secondary | ICD-10-CM

## 2024-08-10 DIAGNOSIS — I5022 Chronic systolic (congestive) heart failure: Secondary | ICD-10-CM

## 2024-08-10 DIAGNOSIS — N189 Chronic kidney disease, unspecified: Secondary | ICD-10-CM | POA: Insufficient documentation

## 2024-08-10 DIAGNOSIS — Z79899 Other long term (current) drug therapy: Secondary | ICD-10-CM | POA: Insufficient documentation

## 2024-08-10 DIAGNOSIS — Z7902 Long term (current) use of antithrombotics/antiplatelets: Secondary | ICD-10-CM | POA: Insufficient documentation

## 2024-08-10 LAB — BASIC METABOLIC PANEL WITH GFR
Anion gap: 13 (ref 5–15)
BUN: 32 mg/dL — ABNORMAL HIGH (ref 8–23)
CO2: 21 mmol/L — ABNORMAL LOW (ref 22–32)
Calcium: 9.6 mg/dL (ref 8.9–10.3)
Chloride: 103 mmol/L (ref 98–111)
Creatinine, Ser: 1.78 mg/dL — ABNORMAL HIGH (ref 0.61–1.24)
GFR, Estimated: 40 mL/min — ABNORMAL LOW (ref >60)
Glucose, Bld: 164 mg/dL — ABNORMAL HIGH (ref 70–99)
Potassium: 4.6 mmol/L (ref 3.5–5.1)
Sodium: 137 mmol/L (ref 135–145)

## 2024-08-10 LAB — CBC
HCT: 38.8 % — ABNORMAL LOW (ref 39.0–52.0)
Hemoglobin: 11.4 g/dL — ABNORMAL LOW (ref 13.0–17.0)
MCH: 23.8 pg — ABNORMAL LOW (ref 26.0–34.0)
MCHC: 29.4 g/dL — ABNORMAL LOW (ref 30.0–36.0)
MCV: 81 fL (ref 80.0–100.0)
Platelets: 227 K/uL (ref 150–400)
RBC: 4.79 MIL/uL (ref 4.22–5.81)
RDW: 20.5 % — ABNORMAL HIGH (ref 11.5–15.5)
WBC: 10.9 K/uL — ABNORMAL HIGH (ref 4.0–10.5)
nRBC: 0 % (ref 0.0–0.2)

## 2024-08-10 NOTE — Patient Instructions (Signed)
 There has been no changes to your medications.  Labs done today, your results will be available in MyChart, we will contact you for abnormal readings.  You are scheduled for a Cardioversion on Thursday, December 11 with Dr. Zenaida.  Please arrive at the [facility] (Main Entrance A) at [facility]: [address] [place], [place] [address] at 7:00 AM (This time is 1 hour(s) before your procedure to ensure your preparation).   Free valet parking service is available. You will check in at ADMITTING.   *Please Note: You will receive a call the day before your procedure to confirm the appointment time. That time may have changed from the original time based on the schedule for that day.*    DIET:  Nothing to eat or drink after midnight except a sip of water with medications (see medication instructions below)  MEDICATION INSTRUCTIONS:  HOLD YOUR LASIX  AND SPIRONOLACTONE  THE MORNING OF YOUR PROCEDURE.         HOLD: Dapagliflozin  (Farxiga ) for 3 days prior to the procedure. Last dose on Sunday, December 07.         Continue taking your anticoagulant (blood thinner): Apixaban  (Eliquis ).  You will need to continue this after your procedure until you are told by your provider that it is safe to stop.     FYI:  For your safety, and to allow us  to monitor your vital signs accurately during the surgery/procedure we request: If you have artificial nails, gel coating, SNS etc, please have those removed prior to your surgery/procedure. Not having the nail coverings /polish removed may result in cancellation or delay of your surgery/procedure.  Your support person will be asked to wait in the waiting room during your procedure.  It is OK to have someone drop you off and come back when you are ready to be discharged.  You cannot drive after the procedure and will need someone to drive you home.  Bring your insurance cards.  *Special Note: Every effort is made to have your procedure done on  time. Occasionally there are emergencies that occur at the hospital that may cause delays. Please be patient if a delay does occur.    Your physician recommends that you schedule a follow-up appointment in: 3 months.  If you have any questions or concerns before your next appointment please send us  a message through M[facility] or call our office at [phone].    TO LEAVE A MESSAGE FOR THE NURSE SELECT OPTION 2, PLEASE LEAVE A MESSAGE INCLUDING: YOUR NAME DATE OF BIRTH CALL BACK NUMBER REASON FOR CALL**this is important as we prioritize the call backs  YOU WILL RECEIVE A CALL BACK THE SAME DAY AS LONG AS YOU CALL BEFORE 4:00 PM  At the [facility], you and your health needs are our priority. As part of our continuing mission to provide you with exceptional heart care, we have created designated Provider Care Teams. These Care Teams include your primary Cardiologist (physician) and Advanced Practice Providers (APPs- Physician Assistants and Nurse Practitioners) who all work together to provide you with the care you need, when you need it.   You may see any of the following providers on your designated Care Team at your next follow up: Dr Toribio Fuel Dr Ezra Shuck Dr. Morene Zenaida Greig Mosses, NP Caffie Shed, [place] [facility] [place], [place] Beckey Coe, NP Jordan Lee, NP Ellouise Class, NP Tinnie Redman, PharmD Jaun Bash, PharmD   Please be sure to bring  in all your medications bottles to every appointment.    Thank you for choosing [facility]

## 2024-08-10 NOTE — Progress Notes (Signed)
 H&V Care Navigation CSW Progress Note  Clinical Social Worker met with pt regarding concerns with affording medications.   Pt pays $47 fofarxiga  and eliquis  which is difficult given income of $1,800/month.  CSW messaged patient advocate- they will assist in getting pt a grant to cover both of those medications.  CSW also assisted in applying for Extra Help since patient seems to meet criteria- hopefully this will more permanently lower the cost of his medications and potential decrease his insurance premium.  SDOH Screenings   Food Insecurity: No Food Insecurity (07/12/2024)  Housing: Low Risk  (07/12/2024)  Transportation Needs: No Transportation Needs (07/12/2024)  Utilities: Not At Risk (07/12/2024)  Financial Resource Strain: Low Risk  (12/17/2022)   Received from [facility]  Physical Activity: Inactive (02/07/2022)   Received from [facility]  Social Connections: Moderately Isolated (07/12/2024)  Stress: No Stress Concern Present (02/07/2022)   Received from [facility]  Tobacco Use: Medium Risk (07/11/2024)   Will continue to follow and assist as needed  Navia Lindahl H. Bitha Fauteux, LCSW Clinical Social Worker [facility] Desk#: [phone] Cell#: [phone]

## 2024-08-11 ENCOUNTER — Telehealth: Payer: Self-pay

## 2024-08-11 ENCOUNTER — Ambulatory Visit: Payer: Self-pay | Admitting: Cardiovascular Disease

## 2024-08-11 ENCOUNTER — Telehealth (HOSPITAL_COMMUNITY): Payer: Self-pay | Admitting: Licensed Clinical Social Worker

## 2024-08-11 ENCOUNTER — Other Ambulatory Visit (HOSPITAL_COMMUNITY): Payer: Self-pay

## 2024-08-11 DIAGNOSIS — I447 Left bundle-branch block, unspecified: Secondary | ICD-10-CM

## 2024-08-11 NOTE — Telephone Encounter (Signed)
 Transmission received:  Alert remote transmission:  - Cardiac Resynchronization Therapy pacing of < 80%. Pacing was 79% between Aug 10, 2024 03:01 and Aug 11, 2024 03:00. Trending down after SR resumed - route to triage  Pt has returned to NSR.  Advised Dr. Zenaida d/t Pt scheduled for cardioversion.

## 2024-08-11 NOTE — Addendum Note (Signed)
 Encounter addended by: Malvina Pina A on: 08/11/2024 9:17 AM  Actions taken: Imaging Exam ended

## 2024-08-11 NOTE — Telephone Encounter (Signed)
 CSW called pt to clarify what pharmacy he would like meds sent to- [facility] but no meds appear to be there.  Trying to clarify so we can make sure appropriate pharmacy has his grant information to help with farxiga  and eliquis .  Left VM- awaiting return call  Andriette HILARIO Leech, LCSW Clinical Social Worker [facility] Desk#: [phone] Cell#: [phone]

## 2024-08-12 ENCOUNTER — Other Ambulatory Visit: Payer: Self-pay

## 2024-08-12 ENCOUNTER — Other Ambulatory Visit (HOSPITAL_COMMUNITY): Payer: Self-pay

## 2024-08-13 NOTE — Progress Notes (Signed)
 ADVANCED HEART FAILURE FOLLOW UP CLINIC NOTE  Referring Physician: Haze Kingfisher, MD  Primary Care: Haze Kingfisher, MD Primary Cardiologist:  HPI: Mark Meadows is a 73 y.o. male who presents for follow up of chronic systolic heart faliure.      Admitted to [facility] in 5/25 with anterolateral STEMI w/ PCI to the LAD. LVEF 20% with LV thrombus. Required slow inotrope wean due to cardiogenic shock. Inpatient expedited TAVR canceled due to apical LV thrombus.    Subsequently has undergone TAVR as well as CRT placement. Did not undergo DCCV for afib given need to hold OAC for device placement.      SUBJECTIVE:  Continues to feel fair, the majority of this diagnosis is new to him and is unsure how symptomatic he should expect to be. He is somewhat limited by his breathing and would like his symptoms to continue to improve. He has not been taking his medication regularly and states I'll probably stop them all at some point. We discussed the need for OAC to prevent stroke, especially if DCCV is considered and he agreed to start this medication.   PMH, current medications, allergies, social history, and family history reviewed in epic.  PHYSICAL EXAM: Vitals:   08/10/24 0938  BP: 115/82  Pulse: 62  SpO2: 100%   GENERAL: chroincally ill appearing PULM:  Normal work of breathing, clear to auscultation bilaterally. Respirations are unlabored.  CARDIAC:  JVP: flat         Normal rate with regular rhythm. No murmurs, rubs or gallops.  Trace edema. Warm and well perfused extremities.  Multiple tophi noted ABDOMEN: Soft, non-tender, non-distended. NEUROLOGIC: Patient is oriented x3 with no focal or lateralizing neurologic deficits.    DATA REVIEW  ECG: 03/10/24: AFL with variable conduction   ECHO: 02/16/24: LVEF 15-20% with LV thrombus, severe  LFLG AS, normal RV function 08/09/2024: LVEF 30-35%, normal RV function, TAVR valve functioning well  CATH: 02/15/2024: mid LAD 100%  stenosed s/p PCI, otherwise mild disease   CMR: 02/2024: LVEF 32%, RVEF 44%, svere AS, apical LV thrombus  ASSESSMENT & PLAN:  Chronic systolic heart failure: Predominantly ischemic, though with contribution from previous LFLG AS. NYHA class III symptoms. Reports relative noncompliance with medications, discussed at length today. - Continue spironolactone  25mg  daily - Continue digoxin  0.125mg  daily - Continue farxiga  10mg  daily - Ideally ARB at next visit - Euvolemic, no need for scheduled diuretics  - CRT-D in place  CAD:  - LAD infarct, continues on apixaban  5mg  BID and Plavix  75 mg daily - Continue Crestor  40 mg daily  Severe aortic stenosis: - TAVR on 07/05/2024, tolerated well  Atrial fibrillation: - persistent, cardioversion not completed in the past given need to hold anticoagulation - Long discussion today about the importance of compliance with his blood thinners - Will start taking today, remains in atrial fibrillation based on last device check - Will arrange for cardioversion in 3 weeks if he has been taking his medications  CKD:  - Stage IIIb, continue SGLT2 as above - Hopefully ARB in the future  Gout:  - Longstanding, severely limited for the patient.  Referral to rheumatology previously sent - Continue allopurinol  and colchicine   Follow up in 3 months  I spent 45 minutes caring for this patient today including face to face time, ordering and reviewing labs, reviewing records from Dr. Gardenia, multiple hospital visits, echocardiogram images, seeing the patient, documenting in the record, and arranging follow ups.   Morene Brownie, MD Advanced  Heart Failure Mechanical Circulatory Support 08/13/24

## 2024-08-15 ENCOUNTER — Other Ambulatory Visit (HOSPITAL_COMMUNITY): Payer: Self-pay

## 2024-08-15 ENCOUNTER — Other Ambulatory Visit: Payer: Self-pay

## 2024-08-15 ENCOUNTER — Ambulatory Visit: Admitting: Physician Assistant

## 2024-08-15 ENCOUNTER — Ambulatory Visit (HOSPITAL_COMMUNITY)

## 2024-08-15 MED ORDER — ALLOPURINOL 100 MG PO TABS
200.0000 mg | ORAL_TABLET | Freq: Every day | ORAL | 3 refills | Status: AC
Start: 2024-05-04 — End: ?
  Filled 2024-08-15: qty 60, 30d supply, fill #0
  Filled 2024-08-16: qty 180, 90d supply, fill #0
  Filled 2024-08-16: qty 60, 30d supply, fill #0
  Filled 2024-09-19: qty 60, 30d supply, fill #1
  Filled 2024-10-17: qty 60, 30d supply, fill #2

## 2024-08-15 MED ORDER — ROSUVASTATIN CALCIUM 40 MG PO TABS
40.0000 mg | ORAL_TABLET | Freq: Every day | ORAL | 3 refills | Status: AC
  Filled 2024-08-15 – 2024-08-16 (×2): qty 30, 30d supply, fill #0

## 2024-08-15 MED ORDER — LEVOTHYROXINE SODIUM 150 MCG PO TABS
ORAL_TABLET | ORAL | 1 refills | Status: AC
  Filled 2024-08-15: qty 108, 90d supply, fill #0
  Filled 2024-10-24: qty 101, 78d supply, fill #1
  Filled 2024-10-24: qty 101, 77d supply, fill #1

## 2024-08-15 MED ORDER — DAPAGLIFLOZIN PROPANEDIOL 10 MG PO TABS
10.0000 mg | ORAL_TABLET | Freq: Every day | ORAL | 5 refills | Status: AC
  Filled 2024-08-15: qty 30, 30d supply, fill #0
  Filled 2024-09-08: qty 13, 13d supply, fill #0

## 2024-08-16 ENCOUNTER — Other Ambulatory Visit (HOSPITAL_COMMUNITY): Payer: Self-pay

## 2024-08-16 ENCOUNTER — Other Ambulatory Visit: Payer: Self-pay

## 2024-08-16 MED ORDER — DAPAGLIFLOZIN PROPANEDIOL 10 MG PO TABS
10.0000 mg | ORAL_TABLET | Freq: Every day | ORAL | 5 refills | Status: AC
  Filled 2024-08-16: qty 30, 30d supply, fill #0

## 2024-08-16 MED ORDER — TAMSULOSIN HCL 0.4 MG PO CAPS
0.4000 mg | ORAL_CAPSULE | Freq: Every evening | ORAL | 3 refills | Status: DC
  Filled 2024-08-16 (×2): qty 30, 30d supply, fill #0

## 2024-08-16 MED ORDER — LEVOTHYROXINE SODIUM 150 MCG PO TABS
ORAL_TABLET | ORAL | 0 refills | Status: AC
  Filled 2024-10-24: qty 108, 84d supply, fill #0

## 2024-08-16 MED ORDER — CLOPIDOGREL BISULFATE 75 MG PO TABS
75.0000 mg | ORAL_TABLET | Freq: Every day | ORAL | 5 refills | Status: AC
  Filled 2024-08-16 – 2024-10-17 (×5): qty 30, 30d supply, fill #0

## 2024-08-16 MED ORDER — ROSUVASTATIN CALCIUM 40 MG PO TABS
40.0000 mg | ORAL_TABLET | Freq: Every morning | ORAL | 3 refills | Status: AC
  Filled 2024-08-16 (×2): qty 30, 30d supply, fill #0
  Filled 2024-09-19: qty 30, 30d supply, fill #1
  Filled 2024-10-17: qty 30, 30d supply, fill #2

## 2024-08-16 NOTE — Addendum Note (Signed)
 Encounter addended by: Izetta Concetta KIDD, CMA on: 08/16/2024 9:11 AM  Actions taken: Flowsheet accepted

## 2024-08-17 ENCOUNTER — Other Ambulatory Visit (HOSPITAL_COMMUNITY): Payer: Self-pay

## 2024-08-17 ENCOUNTER — Other Ambulatory Visit: Payer: Self-pay

## 2024-08-25 ENCOUNTER — Ambulatory Visit

## 2024-08-25 DIAGNOSIS — R001 Bradycardia, unspecified: Secondary | ICD-10-CM | POA: Diagnosis not present

## 2024-08-25 LAB — CUP PACEART REMOTE DEVICE CHECK
Battery Remaining Longevity: 96 mo
Battery Remaining Percentage: 100 %
Brady Statistic RA Percent Paced: 47 %
Brady Statistic RV Percent Paced: 86 %
Date Time Interrogation Session: 20251204030000
HighPow Impedance: 77 Ohm
Implantable Lead Connection Status: 753985
Implantable Lead Connection Status: 753985
Implantable Lead Connection Status: 753985
Implantable Lead Implant Date: 20251023
Implantable Lead Implant Date: 20251023
Implantable Lead Implant Date: 20251023
Implantable Lead Location: 753858
Implantable Lead Location: 753859
Implantable Lead Location: 753860
Implantable Lead Model: 4671
Implantable Lead Model: 673
Implantable Lead Model: 7841
Implantable Lead Serial Number: 1671126
Implantable Lead Serial Number: 281788
Implantable Lead Serial Number: 901945
Implantable Pulse Generator Implant Date: 20251023
Lead Channel Impedance Value: 1060 Ohm
Lead Channel Impedance Value: 496 Ohm
Lead Channel Impedance Value: 657 Ohm
Lead Channel Pacing Threshold Amplitude: 0.4 V
Lead Channel Pacing Threshold Amplitude: 1.2 V
Lead Channel Pacing Threshold Amplitude: 1.3 V
Lead Channel Pacing Threshold Pulse Width: 0.4 ms
Lead Channel Pacing Threshold Pulse Width: 0.4 ms
Lead Channel Pacing Threshold Pulse Width: 0.8 ms
Lead Channel Setting Pacing Amplitude: 3.5 V
Lead Channel Setting Pacing Amplitude: 3.5 V
Lead Channel Setting Pacing Amplitude: 3.5 V
Lead Channel Setting Pacing Pulse Width: 0.4 ms
Lead Channel Setting Pacing Pulse Width: 0.8 ms
Lead Channel Setting Sensing Sensitivity: 0.5 mV
Lead Channel Setting Sensing Sensitivity: 1 mV
Pulse Gen Serial Number: 362887
Zone Setting Status: 755011

## 2024-08-26 ENCOUNTER — Telehealth (HOSPITAL_COMMUNITY): Payer: Self-pay

## 2024-08-26 NOTE — Progress Notes (Signed)
 Remote ICD Transmission

## 2024-08-26 NOTE — Telephone Encounter (Signed)
 Spoke to patient about not needing cardioversion. As per Dr.Stoner patient is in NSR. Cardioversion cancelled.

## 2024-08-29 ENCOUNTER — Other Ambulatory Visit (HOSPITAL_COMMUNITY): Payer: Self-pay

## 2024-09-01 ENCOUNTER — Encounter (HOSPITAL_COMMUNITY): Payer: Self-pay

## 2024-09-01 ENCOUNTER — Ambulatory Visit (HOSPITAL_COMMUNITY): Admit: 2024-09-01 | Admitting: Cardiology

## 2024-09-01 DIAGNOSIS — I4819 Other persistent atrial fibrillation: Secondary | ICD-10-CM

## 2024-09-01 SURGERY — CARDIOVERSION (CATH LAB)
Anesthesia: Monitor Anesthesia Care

## 2024-09-08 ENCOUNTER — Other Ambulatory Visit (HOSPITAL_COMMUNITY): Payer: Self-pay

## 2024-09-08 ENCOUNTER — Other Ambulatory Visit: Payer: Self-pay

## 2024-09-09 ENCOUNTER — Ambulatory Visit: Payer: Self-pay | Admitting: Cardiology

## 2024-09-19 ENCOUNTER — Other Ambulatory Visit: Payer: Self-pay

## 2024-09-20 ENCOUNTER — Other Ambulatory Visit: Payer: Self-pay

## 2024-09-21 ENCOUNTER — Other Ambulatory Visit: Payer: Self-pay

## 2024-09-21 ENCOUNTER — Other Ambulatory Visit (HOSPITAL_COMMUNITY): Payer: Self-pay

## 2024-10-03 ENCOUNTER — Other Ambulatory Visit: Payer: Self-pay

## 2024-10-04 ENCOUNTER — Other Ambulatory Visit (HOSPITAL_COMMUNITY): Payer: Self-pay | Admitting: Physician Assistant

## 2024-10-04 ENCOUNTER — Other Ambulatory Visit (HOSPITAL_COMMUNITY): Payer: Self-pay | Admitting: Cardiology

## 2024-10-04 ENCOUNTER — Other Ambulatory Visit: Payer: Self-pay

## 2024-10-04 ENCOUNTER — Encounter: Payer: Self-pay | Admitting: Pharmacist

## 2024-10-04 ENCOUNTER — Other Ambulatory Visit (HOSPITAL_COMMUNITY): Payer: Self-pay

## 2024-10-05 ENCOUNTER — Other Ambulatory Visit (HOSPITAL_COMMUNITY): Payer: Self-pay

## 2024-10-05 MED ORDER — PANTOPRAZOLE SODIUM 40 MG PO TBEC
40.0000 mg | DELAYED_RELEASE_TABLET | Freq: Every day | ORAL | 5 refills | Status: AC
  Filled 2024-10-17: qty 30, 30d supply, fill #0

## 2024-10-05 MED ORDER — SPIRONOLACTONE 25 MG PO TABS
25.0000 mg | ORAL_TABLET | Freq: Every day | ORAL | 6 refills | Status: AC
  Filled 2024-10-24: qty 30, 30d supply, fill #0

## 2024-10-05 MED ORDER — DIGOXIN 125 MCG PO TABS
0.1250 mg | ORAL_TABLET | Freq: Every day | ORAL | 5 refills | Status: AC
  Filled 2024-10-17: qty 30, 30d supply, fill #0

## 2024-10-06 ENCOUNTER — Other Ambulatory Visit: Payer: Self-pay

## 2024-10-10 ENCOUNTER — Other Ambulatory Visit: Payer: Self-pay

## 2024-10-13 ENCOUNTER — Encounter: Payer: Self-pay | Admitting: Student

## 2024-10-13 ENCOUNTER — Ambulatory Visit: Payer: Self-pay | Admitting: Cardiology

## 2024-10-13 ENCOUNTER — Ambulatory Visit: Attending: Student | Admitting: Student

## 2024-10-13 VITALS — BP 116/74 | HR 67 | Ht 69.0 in | Wt 203.0 lb

## 2024-10-13 DIAGNOSIS — I447 Left bundle-branch block, unspecified: Secondary | ICD-10-CM | POA: Diagnosis not present

## 2024-10-13 DIAGNOSIS — R001 Bradycardia, unspecified: Secondary | ICD-10-CM | POA: Diagnosis not present

## 2024-10-13 DIAGNOSIS — I5022 Chronic systolic (congestive) heart failure: Secondary | ICD-10-CM

## 2024-10-13 DIAGNOSIS — I4819 Other persistent atrial fibrillation: Secondary | ICD-10-CM | POA: Diagnosis not present

## 2024-10-13 DIAGNOSIS — I2510 Atherosclerotic heart disease of native coronary artery without angina pectoris: Secondary | ICD-10-CM

## 2024-10-13 DIAGNOSIS — Z952 Presence of prosthetic heart valve: Secondary | ICD-10-CM

## 2024-10-13 LAB — CUP PACEART INCLINIC DEVICE CHECK
Date Time Interrogation Session: 20260122111402
HighPow Impedance: 95 Ohm
Implantable Lead Connection Status: 753985
Implantable Lead Connection Status: 753985
Implantable Lead Connection Status: 753985
Implantable Lead Implant Date: 20251023
Implantable Lead Implant Date: 20251023
Implantable Lead Implant Date: 20251023
Implantable Lead Location: 753858
Implantable Lead Location: 753859
Implantable Lead Location: 753860
Implantable Lead Model: 4671
Implantable Lead Model: 673
Implantable Lead Model: 7841
Implantable Lead Serial Number: 1671126
Implantable Lead Serial Number: 281788
Implantable Lead Serial Number: 901945
Implantable Pulse Generator Implant Date: 20251023
Lead Channel Impedance Value: 572 Ohm
Lead Channel Impedance Value: 743 Ohm
Lead Channel Impedance Value: 998 Ohm
Lead Channel Pacing Threshold Amplitude: 0.4 V
Lead Channel Pacing Threshold Amplitude: 0.9 V
Lead Channel Pacing Threshold Amplitude: 1 V
Lead Channel Pacing Threshold Pulse Width: 0.4 ms
Lead Channel Pacing Threshold Pulse Width: 0.6 ms
Lead Channel Pacing Threshold Pulse Width: 0.8 ms
Lead Channel Sensing Intrinsic Amplitude: 15.6 mV
Lead Channel Sensing Intrinsic Amplitude: 2.1 mV
Lead Channel Sensing Intrinsic Amplitude: 24.7 mV
Lead Channel Setting Pacing Amplitude: 2.5 V
Lead Channel Setting Pacing Amplitude: 2.5 V
Lead Channel Setting Pacing Amplitude: 2.5 V
Lead Channel Setting Pacing Pulse Width: 0.4 ms
Lead Channel Setting Pacing Pulse Width: 0.8 ms
Lead Channel Setting Sensing Sensitivity: 0.5 mV
Lead Channel Setting Sensing Sensitivity: 1 mV
Pulse Gen Serial Number: 362887
Zone Setting Status: 755011

## 2024-10-13 NOTE — Progress Notes (Signed)
" °  Electrophysiology Office Note:   ID:  Mark Meadows, Mark Meadows [birth year], MRN [id number]  Primary Cardiologist: Ezra Shuck, MD Electrophysiologist: Fonda Kitty, MD      History of Present Illness:   Mark Meadows is a 74 y.o. male with h/o LV thrombus, CAD s/p PCI to LAD, AS s/p TAVR, CHF, LBBB, and symptomatic bradycardia s/p CRT-D seen today for routine electrophysiology follow-up s/p Defibrillator implant.  Since last being seen in our clinic the patient reports doing well overall. Currently, he denies chest pain, palpitations, dyspnea, PND, orthopnea, nausea, vomiting, dizziness, syncope, edema, weight gain, or early satiety.    Review of systems complete and found to be negative unless listed in HPI.   EP Information / Studies Reviewed:    EKG is ordered today. Personal review as below.  EKG Interpretation Date/Time:  Thursday October 13 2024 10:42:11 EST Ventricular Rate:  67 PR Interval:  144 QRS Duration:  124 QT Interval:  422 QTC Calculation: 445 R Axis:   250  Text Interpretation: Atrial-sensed ventricular-paced rhythm Biventricular pacemaker detected Confirmed by Lesia Heck ([id number]) on 10/13/2024 10:45:32 AM    ICD Interrogation-  reviewed in detail today,  See PACEART report.  Arrhythmia/Device History Boston BiV ICD implanted 06/2024 for CHF and LBBB with symptomatic bradycardia.     Physical Exam:   VS:  BP 116/74   Pulse 67   Ht 5' 9 (1.753 m)   Wt 203 lb (92.1 kg)   SpO2 99%   BMI 29.98 kg/m    Wt Readings from Last 3 Encounters:  10/13/24 203 lb (92.1 kg)  08/10/24 202 lb (91.6 kg)  07/12/24 198 lb 9.6 oz (90.1 kg)     GEN: No acute distress  NECK: No JVD; No carotid bruits CARDIAC: Regular rate and rhythm, no murmurs, rubs, gallops RESPIRATORY:  Clear to auscultation without rales, wheezing or rhonchi  ABDOMEN: Soft, non-tender, non-distended EXTREMITIES:  No edema; No deformity   ASSESSMENT AND PLAN:    Chronic systolic CHF  s/p [employer]  Scientific CRT-D  euvolemic today Stable on an appropriate medical regimen Normal ICD function See Pace Art report No changes today  Paroxysmal AF Secondary hypercoagulable state No further burden by device since November Continue eliquis  5 mg BID for CHA2DS2/VASc of at least 5   CAD No s/s of ischemia.      AS s/p TAVR 06/2024 Stable post op echo.   Disposition:   Follow up with EP Team in 12 months. Other teams as scheduled.    Signed, Ozell Prentice Lesia, PA-C  "

## 2024-10-13 NOTE — Patient Instructions (Signed)
 Medication Instructions:  No medication changes today. *If you need a refill on your cardiac medications before your next appointment, please call your pharmacy*  Lab Work: No labwork ordered today. If you have labs (blood work) drawn today and your tests are completely normal, you will receive your results only by: MyChart Message (if you have MyChart) OR A paper copy in the mail If you have any lab test that is abnormal or we need to change your treatment, we will call you to review the results.  Testing/Procedures: No testing ordered today  Follow-Up: At [facility], you and your health needs are our priority.  As part of our continuing mission to provide you with exceptional heart care, our providers are all part of one team.  This team includes your primary Cardiologist (physician) and Advanced Practice Providers or APPs (Physician Assistants and Nurse Practitioners) who all work together to provide you with the care you need, when you need it.  Your next appointment:   12 month(s)  Provider:   You may see Ardeen Kohler, MD or one of the following Advanced Practice Providers on your designated Care Team:   Mertha Abrahams, Kennard Pea "Jonelle Neri" [place], PA-C Suzann Riddle, NP Creighton Doffing, NP    We recommend signing up for the patient portal called "MyChart".  Sign up information is provided on this After Visit Summary.  MyChart is used to connect with patients for Virtual Visits (Telemedicine).  Patients are able to view lab/test results, encounter notes, upcoming appointments, etc.  Non-urgent messages can be sent to your provider as well.   To learn more about what you can do with MyChart, go to [email or web address].

## 2024-10-17 ENCOUNTER — Other Ambulatory Visit (HOSPITAL_COMMUNITY): Payer: Self-pay

## 2024-10-19 ENCOUNTER — Encounter: Payer: Self-pay | Admitting: Cardiology

## 2024-10-19 ENCOUNTER — Other Ambulatory Visit: Payer: Self-pay

## 2024-10-19 NOTE — Telephone Encounter (Signed)
 Error

## 2024-10-20 ENCOUNTER — Other Ambulatory Visit: Payer: Self-pay

## 2024-10-24 ENCOUNTER — Other Ambulatory Visit: Payer: Self-pay

## 2024-10-24 ENCOUNTER — Other Ambulatory Visit (HOSPITAL_COMMUNITY): Payer: Self-pay

## 2024-10-25 ENCOUNTER — Other Ambulatory Visit: Payer: Self-pay

## 2024-11-10 ENCOUNTER — Ambulatory Visit (HOSPITAL_COMMUNITY)

## 2024-11-24 ENCOUNTER — Ambulatory Visit

## 2025-02-23 ENCOUNTER — Ambulatory Visit

## 2025-05-25 ENCOUNTER — Ambulatory Visit

## 2025-08-24 ENCOUNTER — Ambulatory Visit

## 2025-11-23 ENCOUNTER — Ambulatory Visit
